# Patient Record
Sex: Female | Born: 1945 | Race: White | Hispanic: No | Marital: Married | Smoking: Never smoker
Health system: Southern US, Community
[De-identification: ages and names within clinical notes are randomized; demographics above are authoritative.]

## PROBLEM LIST (undated history)

## (undated) DIAGNOSIS — R3915 Urgency of urination: Secondary | ICD-10-CM

## (undated) DIAGNOSIS — G20 Parkinson's disease: Secondary | ICD-10-CM

## (undated) DIAGNOSIS — C50912 Malignant neoplasm of unspecified site of left female breast: Secondary | ICD-10-CM

## (undated) DIAGNOSIS — G20A1 Parkinson's disease without dyskinesia, without mention of fluctuations: Secondary | ICD-10-CM

## (undated) DIAGNOSIS — R350 Frequency of micturition: Secondary | ICD-10-CM

## (undated) DIAGNOSIS — N8110 Cystocele, unspecified: Secondary | ICD-10-CM

## (undated) DIAGNOSIS — F411 Generalized anxiety disorder: Secondary | ICD-10-CM

## (undated) DIAGNOSIS — M8580 Other specified disorders of bone density and structure, unspecified site: Secondary | ICD-10-CM

## (undated) DIAGNOSIS — Z973 Presence of spectacles and contact lenses: Secondary | ICD-10-CM

## (undated) HISTORY — PX: EXTERNAL EAR SURGERY: SHX627

## (undated) HISTORY — DX: Parkinson's disease without dyskinesia, without mention of fluctuations: G20.A1

## (undated) HISTORY — DX: Malignant neoplasm of unspecified site of left female breast: C50.912

## (undated) HISTORY — DX: Parkinson's disease: G20

## (undated) HISTORY — PX: ABDOMINAL HYSTERECTOMY: SHX81

## (undated) HISTORY — PX: COLONOSCOPY: SHX174

## (undated) HISTORY — DX: Other specified disorders of bone density and structure, unspecified site: M85.80

---

## 2000-10-18 ENCOUNTER — Other Ambulatory Visit: Admission: RE | Admit: 2000-10-18 | Discharge: 2000-10-18 | Payer: Self-pay | Admitting: Obstetrics and Gynecology

## 2003-04-17 ENCOUNTER — Other Ambulatory Visit: Admission: RE | Admit: 2003-04-17 | Discharge: 2003-04-17 | Payer: Self-pay | Admitting: Obstetrics and Gynecology

## 2004-05-26 ENCOUNTER — Other Ambulatory Visit: Admission: RE | Admit: 2004-05-26 | Discharge: 2004-05-26 | Payer: Self-pay | Admitting: Obstetrics and Gynecology

## 2004-08-09 ENCOUNTER — Ambulatory Visit: Payer: Self-pay | Admitting: Family Medicine

## 2005-04-07 ENCOUNTER — Ambulatory Visit: Payer: Self-pay | Admitting: Family Medicine

## 2005-07-12 ENCOUNTER — Other Ambulatory Visit: Admission: RE | Admit: 2005-07-12 | Discharge: 2005-07-12 | Payer: Self-pay | Admitting: Obstetrics and Gynecology

## 2005-09-19 HISTORY — PX: ESOPHAGOGASTRODUODENOSCOPY: SHX1529

## 2005-11-04 ENCOUNTER — Ambulatory Visit: Payer: Self-pay | Admitting: Internal Medicine

## 2006-03-27 ENCOUNTER — Ambulatory Visit: Payer: Self-pay | Admitting: Internal Medicine

## 2006-06-08 ENCOUNTER — Ambulatory Visit: Payer: Self-pay | Admitting: Family Medicine

## 2006-11-01 ENCOUNTER — Emergency Department (HOSPITAL_COMMUNITY): Admission: EM | Admit: 2006-11-01 | Discharge: 2006-11-01 | Payer: Self-pay | Admitting: Emergency Medicine

## 2006-11-18 HISTORY — PX: LAPAROSCOPIC OVARIAN CYSTECTOMY: SUR786

## 2006-11-29 ENCOUNTER — Other Ambulatory Visit: Admission: RE | Admit: 2006-11-29 | Discharge: 2006-11-29 | Payer: Self-pay | Admitting: Obstetrics and Gynecology

## 2007-12-11 ENCOUNTER — Encounter: Payer: Self-pay | Admitting: Family Medicine

## 2007-12-20 ENCOUNTER — Encounter: Admission: RE | Admit: 2007-12-20 | Discharge: 2007-12-20 | Payer: Self-pay | Admitting: Obstetrics and Gynecology

## 2008-06-02 ENCOUNTER — Ambulatory Visit: Payer: Self-pay | Admitting: Family Medicine

## 2008-06-02 DIAGNOSIS — M899 Disorder of bone, unspecified: Secondary | ICD-10-CM

## 2008-06-02 DIAGNOSIS — R5383 Other fatigue: Secondary | ICD-10-CM

## 2008-06-02 DIAGNOSIS — N83209 Unspecified ovarian cyst, unspecified side: Secondary | ICD-10-CM

## 2008-06-02 DIAGNOSIS — R5381 Other malaise: Secondary | ICD-10-CM

## 2008-06-02 DIAGNOSIS — M949 Disorder of cartilage, unspecified: Secondary | ICD-10-CM

## 2008-06-02 DIAGNOSIS — M8580 Other specified disorders of bone density and structure, unspecified site: Secondary | ICD-10-CM | POA: Insufficient documentation

## 2008-06-05 ENCOUNTER — Encounter (INDEPENDENT_AMBULATORY_CARE_PROVIDER_SITE_OTHER): Payer: Self-pay | Admitting: *Deleted

## 2008-06-11 ENCOUNTER — Telehealth (INDEPENDENT_AMBULATORY_CARE_PROVIDER_SITE_OTHER): Payer: Self-pay | Admitting: *Deleted

## 2008-06-16 ENCOUNTER — Encounter (INDEPENDENT_AMBULATORY_CARE_PROVIDER_SITE_OTHER): Payer: Self-pay | Admitting: *Deleted

## 2008-06-24 ENCOUNTER — Encounter: Admission: RE | Admit: 2008-06-24 | Discharge: 2008-06-24 | Payer: Self-pay | Admitting: Obstetrics and Gynecology

## 2008-06-26 ENCOUNTER — Ambulatory Visit: Payer: Self-pay | Admitting: Family Medicine

## 2009-04-01 ENCOUNTER — Encounter: Admission: RE | Admit: 2009-04-01 | Discharge: 2009-04-01 | Payer: Self-pay | Admitting: Obstetrics and Gynecology

## 2009-06-02 ENCOUNTER — Encounter: Payer: Self-pay | Admitting: Family Medicine

## 2009-06-03 ENCOUNTER — Ambulatory Visit: Payer: Self-pay | Admitting: Family Medicine

## 2009-06-03 DIAGNOSIS — F411 Generalized anxiety disorder: Secondary | ICD-10-CM

## 2009-06-03 DIAGNOSIS — F419 Anxiety disorder, unspecified: Secondary | ICD-10-CM | POA: Insufficient documentation

## 2009-06-05 ENCOUNTER — Encounter: Payer: Self-pay | Admitting: Family Medicine

## 2009-06-05 ENCOUNTER — Encounter (INDEPENDENT_AMBULATORY_CARE_PROVIDER_SITE_OTHER): Payer: Self-pay | Admitting: *Deleted

## 2009-06-05 LAB — CONVERTED CEMR LAB
Albumin: 4.1 g/dL (ref 3.5–5.2)
Alkaline Phosphatase: 51 units/L (ref 39–117)
Basophils Absolute: 0 10*3/uL (ref 0.0–0.1)
Basophils Relative: 0.7 % (ref 0.0–3.0)
CO2: 29 meq/L (ref 19–32)
Calcium: 9 mg/dL (ref 8.4–10.5)
Eosinophils Absolute: 0.1 10*3/uL (ref 0.0–0.7)
Eosinophils Relative: 2.1 % (ref 0.0–5.0)
Glucose, Bld: 85 mg/dL (ref 70–99)
HDL: 51.4 mg/dL (ref >39.00)
Hemoglobin: 13.7 g/dL (ref 12.0–15.0)
Lymphocytes Relative: 27.6 % (ref 12.0–46.0)
Lymphs Abs: 1.2 10*3/uL (ref 0.7–4.0)
MCHC: 33.8 g/dL (ref 30.0–36.0)
MCV: 94.9 fL (ref 78.0–100.0)
Potassium: 3.9 meq/L (ref 3.5–5.1)
RBC: 4.27 M/uL (ref 3.87–5.11)
TSH: 2.17 microintl units/mL (ref 0.35–5.50)
Total Bilirubin: 1 mg/dL (ref 0.3–1.2)
Total CHOL/HDL Ratio: 4
WBC: 4.5 10*3/uL (ref 4.5–10.5)

## 2009-07-02 ENCOUNTER — Encounter: Admission: RE | Admit: 2009-07-02 | Discharge: 2009-07-02 | Payer: Self-pay | Admitting: Neurology

## 2009-07-29 ENCOUNTER — Encounter: Payer: Self-pay | Admitting: Family Medicine

## 2009-10-20 ENCOUNTER — Telehealth (INDEPENDENT_AMBULATORY_CARE_PROVIDER_SITE_OTHER): Payer: Self-pay | Admitting: *Deleted

## 2009-10-21 ENCOUNTER — Ambulatory Visit: Payer: Self-pay | Admitting: Family Medicine

## 2010-03-10 ENCOUNTER — Encounter: Admission: RE | Admit: 2010-03-10 | Discharge: 2010-03-10 | Payer: Self-pay | Admitting: Obstetrics and Gynecology

## 2010-06-14 ENCOUNTER — Encounter: Payer: Self-pay | Admitting: Family Medicine

## 2010-10-17 LAB — CONVERTED CEMR LAB
ALT: 26 units/L (ref 0–35)
Albumin: 4.3 g/dL (ref 3.5–5.2)
Alkaline Phosphatase: 47 units/L (ref 39–117)
Basophils Absolute: 0 10*3/uL (ref 0.0–0.1)
CA 125: 4.3 units/mL (ref 0.0–30.2)
CO2: 31 meq/L (ref 19–32)
Chloride: 112 meq/L (ref 96–112)
Cholesterol: 141 mg/dL (ref 0–200)
Eosinophils Absolute: 0.1 10*3/uL (ref 0.0–0.7)
Folate: 14.9 ng/mL
Free T4: 0.8 ng/dL (ref 0.6–1.6)
GFR calc non Af Amer: 68 mL/min
HCT: 39.3 % (ref 36.0–46.0)
Hemoglobin: 13.6 g/dL (ref 12.0–15.0)
Monocytes Relative: 8.7 % (ref 3.0–12.0)
Neutro Abs: 2.2 10*3/uL (ref 1.4–7.7)
Neutrophils Relative %: 53.9 % (ref 43.0–77.0)
T3, Free: 3.5 pg/mL (ref 2.3–4.2)
TSH: 1.85 microintl units/mL (ref 0.35–5.50)
Total Bilirubin: 0.9 mg/dL (ref 0.3–1.2)
Total CHOL/HDL Ratio: 3.6
Total Protein: 6.8 g/dL (ref 6.0–8.3)
VLDL: 13 mg/dL (ref 0–40)
Vitamin B-12: 603 pg/mL (ref 211–911)
WBC: 3.9 10*3/uL — ABNORMAL LOW (ref 4.5–10.5)

## 2010-10-19 NOTE — Assessment & Plan Note (Signed)
Summary: boosterix, flu shot//fd  Nurse Visit   Allergies: 1)  ! Penicillin  Immunizations Administered:  Tetanus Vaccine:    Vaccine Type: Tdap    Site: left deltoid    Mfr: GlaxoSmithKline    Dose: 0.5 ml    Route: IM    Given by: Floydene Flock    Exp. Date: 11/13/2011    Lot #: [id number]  Orders Added: 1)  Admin 1st Vaccine [90471] 2)  Flu Vaccine 61yrs + [09811] 3)  Tdap => 97yrs IM [90715] 4)  Admin of Any Addtl Vaccine [90472] Flu Vaccine Consent Questions     Do you have a history of severe allergic reactions to this vaccine? no    Any prior history of allergic reactions to egg and/or gelatin? no    Do you have a sensitivity to the preservative Thimersol? no    Do you have a past history of Guillan-Barre Syndrome? no    Do you currently have an acute febrile illness? no    Have you ever had a severe reaction to latex? no    Vaccine information given and explained to patient? yes    Are you currently pregnant? no    Lot Number:[id number]   Exp Date:03/18/2010   Site Given  right Deltoid IM .lbflu

## 2010-10-19 NOTE — Consult Note (Signed)
Summary: [facility]  [facility]   Imported By: Lanelle Bal 06/22/2010 14:17:34  _____________________________________________________________________  External Attachment:    Type:   Image     Comment:   External Document

## 2010-10-19 NOTE — Progress Notes (Signed)
Summary: ?vaccine  Phone Note Call from Patient   Summary of Call: pt states that she is about to have her 1st grand child and wonder if it is too late to get flu shot? and patient also wonder if it would be a good idea to get the pertussis vaccine since there has been a increase in new born getting this. dr Laury Axon pls advise................Marland KitchenFelecia Deloach CMA  October 20, 2009 3:17 PM   Follow-up for Phone Call        flu shot will take 2 weeks to get in system but she should get one--- she had tetanus in 2005 but I don't know which one---please check paper chart--- if she only had Td then we can give her a boosterix Follow-up by: Loreen Freud DO,  October 20, 2009 3:59 PM  Additional Follow-up for Phone Call Additional follow up Details #1::        pt aware appt schedule...................Marland KitchenFelecia Deloach CMA  October 20, 2009 5:23 PM

## 2011-08-10 ENCOUNTER — Encounter: Payer: Self-pay | Admitting: Family Medicine

## 2011-08-17 ENCOUNTER — Encounter: Payer: Self-pay | Admitting: Family Medicine

## 2011-08-17 ENCOUNTER — Ambulatory Visit (INDEPENDENT_AMBULATORY_CARE_PROVIDER_SITE_OTHER): Payer: BC Managed Care – PPO | Admitting: Family Medicine

## 2011-08-17 VITALS — BP 108/66 | HR 61 | Temp 97.9°F | Ht 65.0 in | Wt 122.2 lb

## 2011-08-17 DIAGNOSIS — Z0000 Encounter for general adult medical examination without abnormal findings: Secondary | ICD-10-CM

## 2011-08-17 DIAGNOSIS — Z23 Encounter for immunization: Secondary | ICD-10-CM

## 2011-08-17 LAB — LIPID PANEL
HDL: 70.1 mg/dL (ref >39.00)
LDL Cholesterol: 101 mg/dL — ABNORMAL HIGH (ref 0–99)
Total CHOL/HDL Ratio: 3
Triglycerides: 52 mg/dL (ref 0.0–149.0)
VLDL: 10.4 mg/dL (ref 0.0–40.0)

## 2011-08-17 LAB — HEPATIC FUNCTION PANEL
AST: 22 U/L (ref 0–37)
Albumin: 4.1 g/dL (ref 3.5–5.2)
Alkaline Phosphatase: 49 U/L (ref 39–117)
Bilirubin, Direct: 0.1 mg/dL (ref 0.0–0.3)

## 2011-08-17 LAB — BASIC METABOLIC PANEL
Calcium: 9.2 mg/dL (ref 8.4–10.5)
Chloride: 103 mEq/L (ref 96–112)
Potassium: 4.3 mEq/L (ref 3.5–5.1)

## 2011-08-17 LAB — TSH: TSH: 1 u[IU]/mL (ref 0.35–5.50)

## 2011-08-17 LAB — CBC WITH DIFFERENTIAL/PLATELET
Basophils Relative: 0.5 % (ref 0.0–3.0)
HCT: 39 % (ref 36.0–46.0)
Hemoglobin: 13.3 g/dL (ref 12.0–15.0)
MCV: 92.3 fl (ref 78.0–100.0)
Platelets: 189 10*3/uL (ref 150.0–400.0)

## 2011-08-17 NOTE — Progress Notes (Signed)
  Subjective:     Amanda [name] is a 65 y.o. female and is here for a comprehensive physical exam. The patient reports no problems.  History   Social History  . Marital Status: Married    Spouse Name: N/A    Number of Children: 3  . Years of Education: N/A   Occupational History  . retired    Social History Main Topics  . Smoking status: Never Smoker   . Smokeless tobacco: Not on file  . Alcohol Use: Yes  . Drug Use: No  . Sexually Active:    Other Topics Concern  . Not on file   Social History Narrative  . No narrative on file   Health Maintenance  Topic Date Due  . Mammogram  03/11/2011  . Influenza Vaccine  06/19/2012  . Pap Smear  03/16/2014  . Colonoscopy  06/23/2014  . Tetanus/tdap  10/22/2019  . Zostavax  Completed    The following portions of the patient's history were reviewed and updated as appropriate: allergies, current medications, past family history, past medical history, past social history, past surgical history and problem list.  Review of Systems Review of Systems  Constitutional: Negative for activity change, appetite change and fatigue.  HENT: Negative for hearing loss, congestion, tinnitus and ear discharge.  dentist q25m Eyes: Negative for visual disturbance (see optho q1y -- vision corrected to 20/20 with glasses).  Respiratory: Negative for cough, chest tightness and shortness of breath.   Cardiovascular: Negative for chest pain, palpitations and leg swelling.  Gastrointestinal: Negative for abdominal pain, diarrhea, constipation and abdominal distention.  Genitourinary: Negative for urgency, frequency, decreased urine volume and difficulty urinating.  Musculoskeletal: Negative for back pain, arthralgias and gait problem.  Skin: Negative for color change, pallor and rash.  Neurological: Negative for dizziness, light-headedness, numbness and headaches.  Hematological: Negative for adenopathy. Does not bruise/bleed easily.  Psychiatric/Behavioral:  Negative for suicidal ideas, confusion, sleep disturbance, self-injury, dysphoric mood, decreased concentration and agitation.       Objective:    BP 108/66  Pulse 61  Temp(Src) 97.9 F (36.6 C) (Oral)  Ht 5\' 5"  (1.651 m)  Wt 122 lb 3.2 oz (55.43 kg)  BMI 20.34 kg/m2  SpO2 99% General appearance: alert, cooperative, appears stated age and no distress Head: Normocephalic, without obvious abnormality, atraumatic Eyes: conjunctivae/corneas clear. PERRL, EOM's intact. Fundi benign. Ears: normal TM's and external ear canals both ears Nose: Nares normal. Septum midline. Mucosa normal. No drainage or sinus tenderness. Throat: lips, mucosa, and tongue normal; teeth and gums normal Neck: no adenopathy, no carotid bruit, no JVD, supple, symmetrical, trachea midline and thyroid not enlarged, symmetric, no tenderness/mass/nodules Back: symmetric, no curvature. ROM normal. No CVA tenderness. Lungs: clear to auscultation bilaterally Breasts: gyn Heart: regular rate and rhythm, S1, S2 normal, no murmur, click, rub or gallop Abdomen: soft, non-tender; bowel sounds normal; no masses,  no organomegaly Pelvic: gyn Extremities: extremities normal, atraumatic, no cyanosis or edema Pulses: 2+ and symmetric Skin: Skin color, texture, turgor normal. No rashes or lesions Lymph nodes: Cervical, supraclavicular, and axillary nodes normal. Neurologic: Alert and oriented X 3, normal strength and tone. Normal symmetric reflexes. Normal coordination and gait psych--no depression/ anxiety    Assessment:    Healthy female exam.   osteopenia--- vita d and calcium   Plan:  Check labs Pap/ mamm per gyn ghm utd   See After Visit Summary for Counseling Recommendations

## 2011-08-17 NOTE — Patient Instructions (Signed)

## 2011-08-18 LAB — CA 125: CA 125: 5.1 U/mL (ref 0.0–30.2)

## 2011-11-09 ENCOUNTER — Ambulatory Visit (INDEPENDENT_AMBULATORY_CARE_PROVIDER_SITE_OTHER): Payer: BC Managed Care – PPO | Admitting: Family Medicine

## 2011-11-09 ENCOUNTER — Encounter: Payer: Self-pay | Admitting: Family Medicine

## 2011-11-09 VITALS — BP 118/75 | HR 79 | Temp 98.5°F | Ht 65.5 in | Wt 122.4 lb

## 2011-11-09 DIAGNOSIS — J329 Chronic sinusitis, unspecified: Secondary | ICD-10-CM | POA: Insufficient documentation

## 2011-11-09 MED ORDER — AZITHROMYCIN 250 MG PO TABS: ORAL_TABLET | ORAL | Status: AC

## 2011-11-09 NOTE — Patient Instructions (Addendum)
This is a sinus infection Take the Azithromycin as directed Drink plenty of fluids REST! Call with any questions or concerns Hang in there!!!

## 2011-11-09 NOTE — Assessment & Plan Note (Signed)
Pt's sxs consistent w/ infxn.  Husband recently had Zpack.  Pt requesting similar.  Start meds.  Reviewed supportive care and red flags that should prompt return.  Pt expressed understanding and is in agreement w/ plan.

## 2011-11-09 NOTE — Progress Notes (Signed)
  Subjective:    Patient ID: Amanda [name], female    DOB: Feb 03, 1946, 66 y.o.   MRN: [id number]  HPI ? Sinusitis- + sick contacts.  sxs started nearly a month ago- this initially improved but then again worsened in the last week.  Developed HA, pain behind her eyes.  + nasal congestion- 'greenish yellow stuff'.  No fevers.  Minimal cough.  No ear pain.  No tooth pain.   Review of Systems For ROS see HPI     Objective:   Physical Exam  Vitals reviewed. Constitutional: She appears well-developed and well-nourished. No distress.  HENT:  Head: Normocephalic and atraumatic.  Right Ear: Tympanic membrane normal.  Left Ear: Tympanic membrane normal.  Nose: Mucosal edema and rhinorrhea present. Right sinus exhibits maxillary sinus tenderness and frontal sinus tenderness. Left sinus exhibits maxillary sinus tenderness and frontal sinus tenderness.  Mouth/Throat: Uvula is midline and mucous membranes are normal. Posterior oropharyngeal erythema present. No oropharyngeal exudate.  Eyes: Conjunctivae and EOM are normal. Pupils are equal, round, and reactive to light.  Neck: Normal range of motion. Neck supple.  Cardiovascular: Normal rate, regular rhythm and normal heart sounds.   Pulmonary/Chest: Effort normal and breath sounds normal. No respiratory distress. She has no wheezes.  Lymphadenopathy:    She has no cervical adenopathy.          Assessment & Plan:

## 2012-03-07 ENCOUNTER — Other Ambulatory Visit: Payer: Self-pay | Admitting: Obstetrics and Gynecology

## 2012-06-18 DIAGNOSIS — G20 Parkinson's disease: Secondary | ICD-10-CM | POA: Diagnosis not present

## 2012-07-12 ENCOUNTER — Encounter: Payer: Self-pay | Admitting: Family Medicine

## 2012-07-23 ENCOUNTER — Telehealth: Payer: Self-pay | Admitting: Family Medicine

## 2012-07-23 NOTE — Telephone Encounter (Signed)
wants ti know what if any immunizations she may need now. Pt coming in [place] for flu and would like to get anything else she might need at this same time Cb# [phone]

## 2012-07-23 NOTE — Telephone Encounter (Signed)
Please advise      KP 

## 2012-07-23 NOTE — Telephone Encounter (Signed)
Flu and pneumonia.

## 2012-07-24 NOTE — Telephone Encounter (Signed)
Called pt to advise per pt she thinks she  Has already gotten this but can not remember. Pt would like to know if she gets it again will it hurt her in anyway?

## 2012-07-24 NOTE — Telephone Encounter (Signed)
Discussed with patient and she voiced understanding she will get the flu and pneumo vaccine tomorrow with flu vaccine       KP

## 2012-07-24 NOTE — Telephone Encounter (Signed)
This is what the provider recommended because there is no documentation of it. I will call the patient to make her aware   KP

## 2012-07-25 ENCOUNTER — Ambulatory Visit: Payer: BC Managed Care – PPO

## 2012-08-08 ENCOUNTER — Ambulatory Visit: Payer: BC Managed Care – PPO

## 2012-08-23 ENCOUNTER — Encounter: Payer: Self-pay | Admitting: Family Medicine

## 2012-09-05 ENCOUNTER — Encounter: Payer: Self-pay | Admitting: Family Medicine

## 2012-10-03 ENCOUNTER — Ambulatory Visit (INDEPENDENT_AMBULATORY_CARE_PROVIDER_SITE_OTHER): Payer: Medicare Other | Admitting: Family Medicine

## 2012-10-03 ENCOUNTER — Encounter: Payer: Self-pay | Admitting: Family Medicine

## 2012-10-03 ENCOUNTER — Telehealth: Payer: Self-pay | Admitting: Family Medicine

## 2012-10-03 VITALS — BP 120/70 | HR 66 | Temp 97.6°F | Ht 64.0 in | Wt 123.0 lb

## 2012-10-03 DIAGNOSIS — N390 Urinary tract infection, site not specified: Secondary | ICD-10-CM

## 2012-10-03 DIAGNOSIS — Z139 Encounter for screening, unspecified: Secondary | ICD-10-CM

## 2012-10-03 DIAGNOSIS — Z136 Encounter for screening for cardiovascular disorders: Secondary | ICD-10-CM

## 2012-10-03 DIAGNOSIS — F411 Generalized anxiety disorder: Secondary | ICD-10-CM

## 2012-10-03 DIAGNOSIS — G20 Parkinson's disease: Secondary | ICD-10-CM | POA: Insufficient documentation

## 2012-10-03 DIAGNOSIS — Z0000 Encounter for general adult medical examination without abnormal findings: Secondary | ICD-10-CM

## 2012-10-03 DIAGNOSIS — F419 Anxiety disorder, unspecified: Secondary | ICD-10-CM

## 2012-10-03 LAB — BASIC METABOLIC PANEL
BUN: 21 mg/dL (ref 6–23)
CO2: 30 mEq/L (ref 19–32)
Calcium: 9.6 mg/dL (ref 8.4–10.5)
Chloride: 103 mEq/L (ref 96–112)
Creatinine, Ser: 1 mg/dL (ref 0.4–1.2)
GFR: 60.47 mL/min (ref >60.00)
Glucose, Bld: 86 mg/dL (ref 70–99)
Potassium: 3.6 mEq/L (ref 3.5–5.1)
Sodium: 139 mEq/L (ref 135–145)

## 2012-10-03 LAB — LIPID PANEL
Cholesterol: 190 mg/dL (ref 0–200)
HDL: 69.6 mg/dL (ref >39.00)
LDL Cholesterol: 104 mg/dL — ABNORMAL HIGH (ref 0–99)
Total CHOL/HDL Ratio: 3
Triglycerides: 80 mg/dL (ref 0.0–149.0)
VLDL: 16 mg/dL (ref 0.0–40.0)

## 2012-10-03 LAB — POCT URINALYSIS DIPSTICK
Protein, UA: NEGATIVE
Urobilinogen, UA: 0.2

## 2012-10-03 NOTE — Telephone Encounter (Signed)
Patient would like CPE notes and lab results sent to Dr. Marvel Plan (fx [phone]) and Dr. Sandria Manly (fx [phone]).

## 2012-10-03 NOTE — Progress Notes (Signed)
Subjective:    Amanda [name] is a 67 y.o. female who presents for a welcome to Medicare exam.   Cardiac risk factors: advanced age (older than 38 for men, 69 for women).  Activities of Daily Living  In your present state of health, do you have any difficulty performing the following activities?:  Preparing food and eating?: No Bathing yourself: No Getting dressed: No Using the toilet:No Moving around from place to place: No In the past year have you fallen or had a near fall?:No  Current exercise habits: stretches in bed, stationary bike   Dietary issues discussed: na   Depression Screen (Note: if answer to either of the following is "Yes", then a more complete depression screening is indicated)  Q1: Over the past two weeks, have you felt down, depressed or hopeless?no Q2: Over the past two weeks, have you felt little interest or pleasure in doing things? no   The following portions of the patient's history were reviewed and updated as appropriate:  She  has a past medical history of Anxiety; Osteopenia; and Runner's knee. She  does not have any pertinent problems on file. She  has past surgical history that includes Ovarian cyst removal (11-2006) and laparoscopy. Her family history includes COPD in her sister; Cancer in her father; Heart disease in her brother and mother; and Pulmonary embolism in her sister. She  reports that she has never smoked. She does not have any smokeless tobacco history on file. She reports that she drinks alcohol. She reports that she does not use illicit drugs. She has a current medication list which includes the following prescription(s): azilect, b complex vitamins, calcium carbonate-vitamin d, ciclopirox, dhea, dhea, NON FORMULARY, and fish oil. Current Outpatient Prescriptions on File Prior to Visit  Medication Sig Dispense Refill  . AZILECT 1 MG TABS Take 1 tablet by mouth daily.       Marland Kitchen b complex vitamins tablet Take 1 tablet by mouth daily.         . Calcium Carbonate-Vitamin D (CALCIUM + D PO) Take 1 tablet by mouth daily. Grow Bone Calcium Tablet       . ciclopirox (LOPROX) 0.77 % cream       . DHEA 25 MG CAPS Take by mouth daily.        . Emollient (DHEA) 1 % CREA Apply topically 2 (two) times daily.        . NON FORMULARY Progesterone and Bi-est Cream as directed       . Omega-3 Fatty Acids (FISH OIL) 1200 MG CAPS Take by mouth daily.         She is allergic to penicillins.. Review of Systems  Review of Systems  Constitutional: Negative for activity change, appetite change and fatigue.  HENT: Negative for hearing loss, congestion, tinnitus and ear discharge.   Eyes: Negative for visual disturbance (see optho q1y -- vision corrected to 20/20 with glasses).  Respiratory: Negative for cough, chest tightness and shortness of breath.   Cardiovascular: Negative for chest pain, palpitations and leg swelling.  Gastrointestinal: Negative for abdominal pain, diarrhea, constipation and abdominal distention.  Genitourinary: Negative for urgency, frequency, decreased urine volume and difficulty urinating.  Musculoskeletal: Negative for back pain, arthralgias and gait problem.  Skin: Negative for color change, pallor and rash.  Neurological: Negative for dizziness, light-headedness, numbness and headaches.  Hematological: Negative for adenopathy. Does not bruise/bleed easily.  Psychiatric/Behavioral: Negative for suicidal ideas, confusion, sleep disturbance, self-injury, dysphoric mood, decreased concentration and agitation.  Pt is able to read and write and can do all ADLs No risk for falling No abuse/ violence in home      Objective:     Vision by Snellen chart: opth Blood pressure 120/70, pulse 66, temperature 97.6 F (36.4 C), temperature source Oral, height 5\' 4"  (1.626 m), weight 123 lb (55.792 kg), SpO2 98.00%. Body mass index is 21.11 kg/(m^2). BP 120/70  Pulse 66  Temp 97.6 F (36.4 C) (Oral)  Ht 5\' 4"  (1.626 m)  Wt  123 lb (55.792 kg)  BMI 21.11 kg/m2  SpO2 98% General appearance: alert, cooperative, appears stated age and no distress Head: Normocephalic, without obvious abnormality, atraumatic Eyes: conjunctivae/corneas clear. PERRL, EOM's intact. Fundi benign. Ears: normal TM's and external ear canals both ears Nose: Nares normal. Septum midline. Mucosa normal. No drainage or sinus tenderness. Throat: lips, mucosa, and tongue normal; teeth and gums normal Neck: no adenopathy, no carotid bruit, no JVD, supple, symmetrical, trachea midline and thyroid not enlarged, symmetric, no tenderness/mass/nodules Back: symmetric, no curvature. ROM normal. No CVA tenderness. Lungs: clear to auscultation bilaterally Breasts: gyn Heart: regular rate and rhythm, S1, S2 normal, no murmur, click, rub or gallop Abdomen: soft, non-tender; bowel sounds normal; no masses,  no organomegaly Pelvic: deferred---gyn Extremities: extremities normal, atraumatic, no cyanosis or edema Pulses: 2+ and symmetric Skin: Skin color, texture, turgor normal. No rashes or lesions Lymph nodes: Cervical, supraclavicular, and axillary nodes normal. Neurologic: Alert and oriented X 3, normal strength and tone. Normal symmetric reflexes. Normal coordination and gait psych-- no depression, no anxiety    Assessment:   cpe     Plan:     During the course of the visit the patient was educated and counseled about appropriate screening and preventive services including:   Pneumococcal vaccine   Influenza vaccine  Td vaccine  Screening electrocardiogram  Screening mammography  Screening Pap smear and pelvic exam   Bone densitometry screening  Colorectal cancer screening  Advanced directives: has an advanced directive - a copy HAS NOT been provided.  Patient Instructions (the written plan) was given to the patient.

## 2012-10-03 NOTE — Assessment & Plan Note (Signed)
Per neuro 

## 2012-10-03 NOTE — Patient Instructions (Signed)
Preventive Care for Adults, Female A healthy lifestyle and preventive care can promote health and wellness. Preventive health guidelines for women include the following key practices.  A routine yearly physical is a good way to check with your caregiver about your health and preventive screening. It is a chance to share any concerns and updates on your health, and to receive a thorough exam.  Visit your dentist for a routine exam and preventive care every 6 months. Brush your teeth twice a day and floss once a day. Good oral hygiene prevents tooth decay and gum disease.  The frequency of eye exams is based on your age, health, family medical history, use of contact lenses, and other factors. Follow your caregiver's recommendations for frequency of eye exams.  Eat a healthy diet. Foods like vegetables, fruits, whole grains, low-fat dairy products, and lean protein foods contain the nutrients you need without too many calories. Decrease your intake of foods high in solid fats, added sugars, and salt. Eat the right amount of calories for you.Get information about a proper diet from your caregiver, if necessary.  Regular physical exercise is one of the most important things you can do for your health. Most adults should get at least 150 minutes of moderate-intensity exercise (any activity that increases your heart rate and causes you to sweat) each week. In addition, most adults need muscle-strengthening exercises on 2 or more days a week.  Maintain a healthy weight. The body mass index (BMI) is a screening tool to identify possible weight problems. It provides an estimate of body fat based on height and weight. Your caregiver can help determine your BMI, and can help you achieve or maintain a healthy weight.For adults 20 years and older:  A BMI below 18.5 is considered underweight.  A BMI of 18.5 to 24.9 is normal.  A BMI of 25 to 29.9 is considered overweight.  A BMI of 30 and above is  considered obese.  Maintain normal blood lipids and cholesterol levels by exercising and minimizing your intake of saturated fat. Eat a balanced diet with plenty of fruit and vegetables. Blood tests for lipids and cholesterol should begin at age 20 and be repeated every 5 years. If your lipid or cholesterol levels are high, you are over 50, or you are at high risk for heart disease, you may need your cholesterol levels checked more frequently.Ongoing high lipid and cholesterol levels should be treated with medicines if diet and exercise are not effective.  If you smoke, find out from your caregiver how to quit. If you do not use tobacco, do not start.  If you are pregnant, do not drink alcohol. If you are breastfeeding, be very cautious about drinking alcohol. If you are not pregnant and choose to drink alcohol, do not exceed 1 drink per day. One drink is considered to be 12 ounces (355 mL) of beer, 5 ounces (148 mL) of wine, or 1.5 ounces (44 mL) of liquor.  Avoid use of street drugs. Do not share needles with anyone. Ask for help if you need support or instructions about stopping the use of drugs.  High blood pressure causes heart disease and increases the risk of stroke. Your blood pressure should be checked at least every 1 to 2 years. Ongoing high blood pressure should be treated with medicines if weight loss and exercise are not effective.  If you are 55 to 66 years old, ask your caregiver if you should take aspirin to prevent strokes.  Diabetes   screening involves taking a blood sample to check your fasting blood sugar level. This should be done once every 3 years, after age 45, if you are within normal weight and without risk factors for diabetes. Testing should be considered at a younger age or be carried out more frequently if you are overweight and have at least 1 risk factor for diabetes.  Breast cancer screening is essential preventive care for women. You should practice "breast  self-awareness." This means understanding the normal appearance and feel of your breasts and may include breast self-examination. Any changes detected, no matter how small, should be reported to a caregiver. Women in their 20s and 30s should have a clinical breast exam (CBE) by a caregiver as part of a regular health exam every 1 to 3 years. After age 40, women should have a CBE every year. Starting at age 40, women should consider having a mammography (breast X-ray test) every year. Women who have a family history of breast cancer should talk to their caregiver about genetic screening. Women at a high risk of breast cancer should talk to their caregivers about having magnetic resonance imaging (MRI) and a mammography every year.  The Pap test is a screening test for cervical cancer. A Pap test can show cell changes on the cervix that might become cervical cancer if left untreated. A Pap test is a procedure in which cells are obtained and examined from the lower end of the uterus (cervix).  Women should have a Pap test starting at age 21.  Between ages 21 and 29, Pap tests should be repeated every 2 years.  Beginning at age 30, you should have a Pap test every 3 years as long as the past 3 Pap tests have been normal.  Some women have medical problems that increase the chance of getting cervical cancer. Talk to your caregiver about these problems. It is especially important to talk to your caregiver if a new problem develops soon after your last Pap test. In these cases, your caregiver may recommend more frequent screening and Pap tests.  The above recommendations are the same for women who have or have not gotten the vaccine for human papillomavirus (HPV).  If you had a hysterectomy for a problem that was not cancer or a condition that could lead to cancer, then you no longer need Pap tests. Even if you no longer need a Pap test, a regular exam is a good idea to make sure no other problems are  starting.  If you are between ages 65 and 70, and you have had normal Pap tests going back 10 years, you no longer need Pap tests. Even if you no longer need a Pap test, a regular exam is a good idea to make sure no other problems are starting.  If you have had past treatment for cervical cancer or a condition that could lead to cancer, you need Pap tests and screening for cancer for at least 20 years after your treatment.  If Pap tests have been discontinued, risk factors (such as a new sexual partner) need to be reassessed to determine if screening should be resumed.  The HPV test is an additional test that may be used for cervical cancer screening. The HPV test looks for the virus that can cause the cell changes on the cervix. The cells collected during the Pap test can be tested for HPV. The HPV test could be used to screen women aged 30 years and older, and should   be used in women of any age who have unclear Pap test results. After the age of 30, women should have HPV testing at the same frequency as a Pap test.  Colorectal cancer can be detected and often prevented. Most routine colorectal cancer screening begins at the age of 50 and continues through age 75. However, your caregiver may recommend screening at an earlier age if you have risk factors for colon cancer. On a yearly basis, your caregiver may provide home test kits to check for hidden blood in the stool. Use of a small camera at the end of a tube, to directly examine the colon (sigmoidoscopy or colonoscopy), can detect the earliest forms of colorectal cancer. Talk to your caregiver about this at age 50, when routine screening begins. Direct examination of the colon should be repeated every 5 to 10 years through age 75, unless early forms of pre-cancerous polyps or small growths are found.  Hepatitis C blood testing is recommended for all people born from 1945 through 1965 and any individual with known risks for hepatitis C.  Practice  safe sex. Use condoms and avoid high-risk sexual practices to reduce the spread of sexually transmitted infections (STIs). STIs include gonorrhea, chlamydia, syphilis, trichomonas, herpes, HPV, and human immunodeficiency virus (HIV). Herpes, HIV, and HPV are viral illnesses that have no cure. They can result in disability, cancer, and death. Sexually active women aged 25 and younger should be checked for chlamydia. Older women with new or multiple partners should also be tested for chlamydia. Testing for other STIs is recommended if you are sexually active and at increased risk.  Osteoporosis is a disease in which the bones lose minerals and strength with aging. This can result in serious bone fractures. The risk of osteoporosis can be identified using a bone density scan. Women ages 65 and over and women at risk for fractures or osteoporosis should discuss screening with their caregivers. Ask your caregiver whether you should take a calcium supplement or vitamin D to reduce the rate of osteoporosis.  Menopause can be associated with physical symptoms and risks. Hormone replacement therapy is available to decrease symptoms and risks. You should talk to your caregiver about whether hormone replacement therapy is right for you.  Use sunscreen with sun protection factor (SPF) of 30 or more. Apply sunscreen liberally and repeatedly throughout the day. You should seek shade when your shadow is shorter than you. Protect yourself by wearing long sleeves, pants, a wide-brimmed hat, and sunglasses year round, whenever you are outdoors.  Once a month, do a whole body skin exam, using a mirror to look at the skin on your back. Notify your caregiver of new moles, moles that have irregular borders, moles that are larger than a pencil eraser, or moles that have changed in shape or color.  Stay current with required immunizations.  Influenza. You need a dose every fall (or winter). The composition of the flu vaccine  changes each year, so being vaccinated once is not enough.  Pneumococcal polysaccharide. You need 1 to 2 doses if you smoke cigarettes or if you have certain chronic medical conditions. You need 1 dose at age 65 (or older) if you have never been vaccinated.  Tetanus, diphtheria, pertussis (Tdap, Td). Get 1 dose of Tdap vaccine if you are younger than age 65, are over 65 and have contact with an infant, are a healthcare worker, are pregnant, or simply want to be protected from whooping cough. After that, you need a Td   booster dose every 10 years. Consult your caregiver if you have not had at least 3 tetanus and diphtheria-containing shots sometime in your life or have a deep or dirty wound.  HPV. You need this vaccine if you are a woman age 26 or younger. The vaccine is given in 3 doses over 6 months.  Measles, mumps, rubella (MMR). You need at least 1 dose of MMR if you were born in 1957 or later. You may also need a second dose.  Meningococcal. If you are age 19 to 21 and a first-year college student living in a residence hall, or have one of several medical conditions, you need to get vaccinated against meningococcal disease. You may also need additional booster doses.  Zoster (shingles). If you are age 60 or older, you should get this vaccine.  Varicella (chickenpox). If you have never had chickenpox or you were vaccinated but received only 1 dose, talk to your caregiver to find out if you need this vaccine.  Hepatitis A. You need this vaccine if you have a specific risk factor for hepatitis A virus infection or you simply wish to be protected from this disease. The vaccine is usually given as 2 doses, 6 to 18 months apart.  Hepatitis B. You need this vaccine if you have a specific risk factor for hepatitis B virus infection or you simply wish to be protected from this disease. The vaccine is given in 3 doses, usually over 6 months. Preventive Services / Frequency Ages 19 to 39  Blood  pressure check.** / Every 1 to 2 years.  Lipid and cholesterol check.** / Every 5 years beginning at age 20.  Clinical breast exam.** / Every 3 years for women in their 20s and 30s.  Pap test.** / Every 2 years from ages 21 through 29. Every 3 years starting at age 30 through age 65 or 70 with a history of 3 consecutive normal Pap tests.  HPV screening.** / Every 3 years from ages 30 through ages 65 to 70 with a history of 3 consecutive normal Pap tests.  Hepatitis C blood test.** / For any individual with known risks for hepatitis C.  Skin self-exam. / Monthly.  Influenza immunization.** / Every year.  Pneumococcal polysaccharide immunization.** / 1 to 2 doses if you smoke cigarettes or if you have certain chronic medical conditions.  Tetanus, diphtheria, pertussis (Tdap, Td) immunization. / A one-time dose of Tdap vaccine. After that, you need a Td booster dose every 10 years.  HPV immunization. / 3 doses over 6 months, if you are 26 and younger.  Measles, mumps, rubella (MMR) immunization. / You need at least 1 dose of MMR if you were born in 1957 or later. You may also need a second dose.  Meningococcal immunization. / 1 dose if you are age 19 to 21 and a first-year college student living in a residence hall, or have one of several medical conditions, you need to get vaccinated against meningococcal disease. You may also need additional booster doses.  Varicella immunization.** / Consult your caregiver.  Hepatitis A immunization.** / Consult your caregiver. 2 doses, 6 to 18 months apart.  Hepatitis B immunization.** / Consult your caregiver. 3 doses usually over 6 months. Ages 40 to 64  Blood pressure check.** / Every 1 to 2 years.  Lipid and cholesterol check.** / Every 5 years beginning at age 20.  Clinical breast exam.** / Every year after age 40.  Mammogram.** / Every year beginning at age 40   and continuing for as long as you are in good health. Consult with your  caregiver.  Pap test.** / Every 3 years starting at age 30 through age 65 or 70 with a history of 3 consecutive normal Pap tests.  HPV screening.** / Every 3 years from ages 30 through ages 65 to 70 with a history of 3 consecutive normal Pap tests.  Fecal occult blood test (FOBT) of stool. / Every year beginning at age 50 and continuing until age 75. You may not need to do this test if you get a colonoscopy every 10 years.  Flexible sigmoidoscopy or colonoscopy.** / Every 5 years for a flexible sigmoidoscopy or every 10 years for a colonoscopy beginning at age 50 and continuing until age 75.  Hepatitis C blood test.** / For all people born from 1945 through 1965 and any individual with known risks for hepatitis C.  Skin self-exam. / Monthly.  Influenza immunization.** / Every year.  Pneumococcal polysaccharide immunization.** / 1 to 2 doses if you smoke cigarettes or if you have certain chronic medical conditions.  Tetanus, diphtheria, pertussis (Tdap, Td) immunization.** / A one-time dose of Tdap vaccine. After that, you need a Td booster dose every 10 years.  Measles, mumps, rubella (MMR) immunization. / You need at least 1 dose of MMR if you were born in 1957 or later. You may also need a second dose.  Varicella immunization.** / Consult your caregiver.  Meningococcal immunization.** / Consult your caregiver.  Hepatitis A immunization.** / Consult your caregiver. 2 doses, 6 to 18 months apart.  Hepatitis B immunization.** / Consult your caregiver. 3 doses, usually over 6 months. Ages 65 and over  Blood pressure check.** / Every 1 to 2 years.  Lipid and cholesterol check.** / Every 5 years beginning at age 20.  Clinical breast exam.** / Every year after age 40.  Mammogram.** / Every year beginning at age 40 and continuing for as long as you are in good health. Consult with your caregiver.  Pap test.** / Every 3 years starting at age 30 through age 65 or 70 with a 3  consecutive normal Pap tests. Testing can be stopped between 65 and 70 with 3 consecutive normal Pap tests and no abnormal Pap or HPV tests in the past 10 years.  HPV screening.** / Every 3 years from ages 30 through ages 65 or 70 with a history of 3 consecutive normal Pap tests. Testing can be stopped between 65 and 70 with 3 consecutive normal Pap tests and no abnormal Pap or HPV tests in the past 10 years.  Fecal occult blood test (FOBT) of stool. / Every year beginning at age 50 and continuing until age 75. You may not need to do this test if you get a colonoscopy every 10 years.  Flexible sigmoidoscopy or colonoscopy.** / Every 5 years for a flexible sigmoidoscopy or every 10 years for a colonoscopy beginning at age 50 and continuing until age 75.  Hepatitis C blood test.** / For all people born from 1945 through 1965 and any individual with known risks for hepatitis C.  Osteoporosis screening.** / A one-time screening for women ages 65 and over and women at risk for fractures or osteoporosis.  Skin self-exam. / Monthly.  Influenza immunization.** / Every year.  Pneumococcal polysaccharide immunization.** / 1 dose at age 65 (or older) if you have never been vaccinated.  Tetanus, diphtheria, pertussis (Tdap, Td) immunization. / A one-time dose of Tdap vaccine if you are over   65 and have contact with an infant, are a healthcare worker, or simply want to be protected from whooping cough. After that, you need a Td booster dose every 10 years.  Varicella immunization.** / Consult your caregiver.  Meningococcal immunization.** / Consult your caregiver.  Hepatitis A immunization.** / Consult your caregiver. 2 doses, 6 to 18 months apart.  Hepatitis B immunization.** / Check with your caregiver. 3 doses, usually over 6 months. ** Family history and personal history of risk and conditions may change your caregiver's recommendations. Document Released: 11/01/2001 Document Revised: 11/28/2011  Document Reviewed: 01/31/2011 [employer] Patient Information 2013 [employer].  

## 2012-10-03 NOTE — Addendum Note (Signed)
Addended by: Silvio Pate D on: 10/03/2012 04:19 PM   Modules accepted: Orders

## 2012-10-04 LAB — CA 125: CA 125: 5.1 U/mL (ref 0.0–30.2)

## 2012-10-04 NOTE — Telephone Encounter (Signed)
Sent via [employer]----    KP

## 2012-10-12 ENCOUNTER — Telehealth: Payer: Self-pay | Admitting: *Deleted

## 2012-10-12 MED ORDER — NITROFURANTOIN MONOHYD MACRO 100 MG PO CAPS: 100.0000 mg | ORAL_CAPSULE | Freq: Two times a day (BID) | ORAL | Status: DC

## 2012-10-12 NOTE — Telephone Encounter (Signed)
Discuss with patient, Rx sent. 

## 2012-10-12 NOTE — Telephone Encounter (Signed)
Message copied by Verdene Rio on Fri Oct 12, 2012  4:54 PM ------      Message from: Lelon Perla      Created: Mon Oct 08, 2012  6:54 PM       + UTI---macrobid 1 po bid for 7 days

## 2012-10-15 ENCOUNTER — Telehealth: Payer: Self-pay | Admitting: Family Medicine

## 2012-10-15 NOTE — Telephone Encounter (Signed)
Patient Information:  Caller Name: Regine  Phone: [phone]  Patient: Amanda [name], Amanda [name]  Gender: Female  DOB: 1945/10/08  Age: 67 Years  PCP: Lelon Perla.  Office Follow Up:  Does the office need to follow up with this patient?: No  Instructions For The Office: N/A   Symptoms  Reason For Call & Symptoms: Has cold and cough since since 1/18.  Her sputum is now green.  No fever. Has a sore throat also.  Has pressure around her eyes.  Reviewed Health History In EMR: Yes  Reviewed Medications In EMR: Yes  Reviewed Allergies In EMR: Yes  Reviewed Surgeries / Procedures: Yes  Date of Onset of Symptoms: 10/06/2012  Guideline(s) Used:  Cough  Disposition Per Guideline:   See Today or Tomorrow in Office  Reason For Disposition Reached:   Continuous (nonstop) coughing interferes with work or school and no improvement using cough treatment per Care Advice  Advice Given:  N/A  Appointment Scheduled:  10/16/2012 11:30:00 Appointment Scheduled Provider:  Lelon Perla.

## 2012-10-15 NOTE — Telephone Encounter (Signed)
Appointment Scheduled:  10/16/2012 11:30:00  Appointment Scheduled Provider:  Lelon Perla

## 2012-10-16 ENCOUNTER — Ambulatory Visit (INDEPENDENT_AMBULATORY_CARE_PROVIDER_SITE_OTHER): Payer: Medicare Other | Admitting: Family Medicine

## 2012-10-16 ENCOUNTER — Encounter: Payer: Self-pay | Admitting: Family Medicine

## 2012-10-16 VITALS — BP 110/66 | HR 79 | Temp 98.3°F | Wt 123.8 lb

## 2012-10-16 DIAGNOSIS — J329 Chronic sinusitis, unspecified: Secondary | ICD-10-CM

## 2012-10-16 MED ORDER — CLARITHROMYCIN ER 500 MG PO TB24: 1000.0000 mg | ORAL_TABLET | Freq: Every day | ORAL | Status: AC

## 2012-10-16 NOTE — Patient Instructions (Signed)

## 2012-10-16 NOTE — Progress Notes (Signed)
  Subjective:     Amanda [name] is a 67 y.o. female who presents for evaluation of sinus pain. Symptoms include: congestion, cough, facial pain, headaches, nasal congestion and sinus pressure. Onset of symptoms was 2 weeks ago. Symptoms have been gradually worsening since that time. Past history is significant for no history of pneumonia or bronchitis. Patient is a non-smoker.  The following portions of the patient's history were reviewed and updated as appropriate: allergies, current medications, past family history, past medical history, past social history, past surgical history and problem list.  Review of Systems Pertinent items are noted in HPI.   Objective:    BP 110/66  Pulse 79  Temp 98.3 F (36.8 C) (Oral)  Wt 123 lb 12.8 oz (56.155 kg)  SpO2 98% General appearance: alert, cooperative, appears stated age and no distress Ears: normal TM's and external ear canals both ears Nose: green discharge, moderate congestion, turbinates red, swollen, sinus tenderness bilateral Throat: lips, mucosa, and tongue normal; teeth and gums normal Neck: mild anterior cervical adenopathy, supple, symmetrical, trachea midline and thyroid not enlarged, symmetric, no tenderness/mass/nodules Lungs: clear to auscultation bilaterally Heart: S1, S2 normal    Assessment:    Acute bacterial sinusitis.    Plan:    Nasal saline sprays. Nasal steroids per medication orders. Biaxin per medication orders.  rto prn

## 2012-10-17 ENCOUNTER — Ambulatory Visit: Payer: Medicare Other

## 2012-10-17 ENCOUNTER — Telehealth: Payer: Self-pay | Admitting: *Deleted

## 2012-10-17 MED ORDER — AZITHROMYCIN 250 MG PO TABS: ORAL_TABLET | ORAL | Status: DC

## 2012-10-17 NOTE — Telephone Encounter (Signed)
Rx faxed and VM left making the patient aware.       KP 

## 2012-10-17 NOTE — Telephone Encounter (Signed)
Please advise in Dr.Lowne's absence.   KP 

## 2012-10-17 NOTE — Telephone Encounter (Signed)
Patient called office, states that she is not taking the medication (Biaxin) Dr. Laury Axon prescribed for her yesterday due to the side effects she read about on the package. Patient states that she has taken Z-Pak in the past and it worked for her, wants to know if she can have that again. I advised that I would send message to MD and we would call her back as soon as MD responds.

## 2012-10-17 NOTE — Telephone Encounter (Signed)
Can have Zpack rather than Biaxin

## 2012-11-21 DIAGNOSIS — G20 Parkinson's disease: Secondary | ICD-10-CM | POA: Diagnosis not present

## 2012-12-26 DIAGNOSIS — D2370 Other benign neoplasm of skin of unspecified lower limb, including hip: Secondary | ICD-10-CM | POA: Diagnosis not present

## 2012-12-26 DIAGNOSIS — L538 Other specified erythematous conditions: Secondary | ICD-10-CM | POA: Diagnosis not present

## 2012-12-26 DIAGNOSIS — L84 Corns and callosities: Secondary | ICD-10-CM | POA: Diagnosis not present

## 2012-12-26 DIAGNOSIS — L821 Other seborrheic keratosis: Secondary | ICD-10-CM | POA: Diagnosis not present

## 2012-12-26 DIAGNOSIS — L819 Disorder of pigmentation, unspecified: Secondary | ICD-10-CM | POA: Diagnosis not present

## 2012-12-26 DIAGNOSIS — B351 Tinea unguium: Secondary | ICD-10-CM | POA: Diagnosis not present

## 2012-12-26 DIAGNOSIS — D1801 Hemangioma of skin and subcutaneous tissue: Secondary | ICD-10-CM | POA: Diagnosis not present

## 2012-12-26 DIAGNOSIS — Z419 Encounter for procedure for purposes other than remedying health state, unspecified: Secondary | ICD-10-CM | POA: Diagnosis not present

## 2013-02-01 ENCOUNTER — Ambulatory Visit (INDEPENDENT_AMBULATORY_CARE_PROVIDER_SITE_OTHER): Payer: Medicare Other | Admitting: Internal Medicine

## 2013-02-01 ENCOUNTER — Telehealth: Payer: Self-pay | Admitting: Family Medicine

## 2013-02-01 ENCOUNTER — Encounter: Payer: Self-pay | Admitting: Internal Medicine

## 2013-02-01 VITALS — BP 112/66 | HR 63 | Temp 98.0°F | Wt 117.0 lb

## 2013-02-01 DIAGNOSIS — N390 Urinary tract infection, site not specified: Secondary | ICD-10-CM | POA: Diagnosis not present

## 2013-02-01 DIAGNOSIS — J019 Acute sinusitis, unspecified: Secondary | ICD-10-CM | POA: Diagnosis not present

## 2013-02-01 DIAGNOSIS — R300 Dysuria: Secondary | ICD-10-CM

## 2013-02-01 LAB — POCT URINALYSIS DIPSTICK
Bilirubin, UA: NEGATIVE
Blood, UA: NEGATIVE
Glucose, UA: NEGATIVE
Ketones, UA: NEGATIVE
Leukocytes, UA: NEGATIVE
pH, UA: 6

## 2013-02-01 MED ORDER — AZELASTINE HCL 0.1 % NA SOLN: 2.0000 | Freq: Two times a day (BID) | NASAL | Status: DC

## 2013-02-01 MED ORDER — AZITHROMYCIN 250 MG PO TABS: ORAL_TABLET | ORAL | Status: DC

## 2013-02-01 NOTE — Telephone Encounter (Signed)
Appointment Scheduled:  02/01/2013 15:45:00  Appointment Scheduled Provider:  Willow Ora

## 2013-02-01 NOTE — Telephone Encounter (Signed)
Patient Information:  Caller Name: Amanda [name]  Phone: [phone]  Patient: Amanda, [name]  Gender: Female  DOB: [birth year]  Age: 67 Years  PCP: Lelon Perla.  Office Follow Up:  Does the office need to follow up with this patient?: No  Instructions For The Office: N/A  RN Note:  Patient wants to have her urine tested for possible urinary infection. She also wants a copy of the lab results she had done in January. Advised patient to request those records when she is in the office this afternoon for her visit. Cancelled appt that had been scheduled for Monday 02/04/13.  Symptoms  Reason For Call & Symptoms: Mild sore throat, nasal congestion, runny nose, hoarseness to voice, yellow/green drainage down back of throat. Reports pain over cheeks, around teeth and in between her eyebrows.  Reviewed Health History In EMR: Yes  Reviewed Medications In EMR: Yes  Reviewed Allergies In EMR: Yes  Reviewed Surgeries / Procedures: Yes  Date of Onset of Symptoms: 02/01/2013  Treatments Tried: Coldeze, Saline nose drops  Treatments Tried Worked: No  Guideline(s) Used:  Colds  Sinus Pain and Congestion  Disposition Per Guideline:   See Today or Tomorrow in Office  Reason For Disposition Reached:   Using nasal washes and pain medicine > 24 hours and sinus pain (lower forehead, cheekbone, or eye) persists  Advice Given:  N/A  Patient Will Follow Care Advice:  YES  Appointment Scheduled:  02/01/2013 15:45:00 Appointment Scheduled Provider:  Willow Ora

## 2013-02-01 NOTE — Progress Notes (Signed)
  Subjective:    Patient ID: Amanda [name], female    DOB: [birth year], 67 y.o.   MRN: [id number]  HPI Symptoms started approximately 2 weeks ago: Sore throat, nasal congestion,postnasal dripping. Initially had clear white discharge, now yellow discharge. Some pain in her teeth as well.  Past Medical History  Diagnosis Date  . Anxiety   . Osteopenia   . Runner's knee   . Parkinson disease    Past Surgical History  Procedure Laterality Date  . Ovarian cyst removal  11-2006  . Laparoscopy      Review of Systems No fever or chills. Mild cough and  hoarseness. Admits to fatigue and some shortness of breath, she thinks related to not being able to breathe through her nose. Denies any itchy eyes or itchy nose. No chest pain. Also concerned abouta UTI she had a few months ago, requests her urine to be checked. Denies at the present time dysuria or hematuria.      Objective:   Physical Exam BP 112/66  Pulse 63  Temp(Src) 98 F (36.7 C) (Oral)  Wt 117 lb (53.071 kg)  BMI 20.07 kg/m2  SpO2 97%  General -- alert, well-developed, NAD.    HEENT -- TMs normal, throat w/o redness, face symmetric and slt tender to palpation at L max sinus and B  Frontal sinus.  EOMI Lungs -- normal respiratory effort, no intercostal retractions, no accessory muscle use, and normal breath sounds.   Heart-- normal rate, regular rhythm, no murmur, and no gallop.    Neurologic-- alert & oriented X3 and strength normal in all extremities. Psych-- Cognition and judgment appear intact. Alert and cooperative with normal attention span and concentration.  not anxious appearing and not depressed appearing.       Assessment & Plan:  Acute sinusitis Findings consistent with acute sinusitis, she is allergic to penicillin, she is afraid to take any antibiotic other than Zithromax because she takes Azilect. She complains of shortness or breath, she thinks related to nose congestion. Vital signs are stable, no  distress. Plan: Zithromax, Mucinex,Astelin; I checked the 3 meds and they don't interact w/ Azilect. Call if not better, call if she's getting worse. If not improving, may need a different antibiotic. Avelox?  UTI, Now asymptomatic, udip negative.

## 2013-02-01 NOTE — Patient Instructions (Addendum)
Rest, fluids , tylenol For congestion take Mucinex  twice a day as needed  For congestion use astelin nasal spray twice a day until you feel better Neither interact with Azilect Take the antibiotic as prescribed  (zithromax) Call if no better in few days Call anytime if the symptoms are severe

## 2013-02-02 ENCOUNTER — Encounter: Payer: Self-pay | Admitting: Internal Medicine

## 2013-02-04 ENCOUNTER — Ambulatory Visit: Payer: Medicare Other | Admitting: Family Medicine

## 2013-02-06 ENCOUNTER — Telehealth: Payer: Self-pay | Admitting: *Deleted

## 2013-02-06 NOTE — Telephone Encounter (Signed)
Spoke to pt, she states she is feeling much better.  

## 2013-02-06 NOTE — Telephone Encounter (Signed)
Was seen with sinusitis, she is doing better.

## 2013-03-27 ENCOUNTER — Ambulatory Visit (INDEPENDENT_AMBULATORY_CARE_PROVIDER_SITE_OTHER): Payer: Medicare Other | Admitting: Neurology

## 2013-03-27 ENCOUNTER — Encounter: Payer: Self-pay | Admitting: Neurology

## 2013-03-27 VITALS — BP 100/61 | HR 67 | Temp 97.7°F | Ht 64.0 in | Wt 115.5 lb

## 2013-03-27 DIAGNOSIS — G20 Parkinson's disease: Secondary | ICD-10-CM | POA: Diagnosis not present

## 2013-03-27 DIAGNOSIS — G20A1 Parkinson's disease without dyskinesia, without mention of fluctuations: Secondary | ICD-10-CM

## 2013-03-27 NOTE — Patient Instructions (Addendum)
I think overall you are doing fairly well and are stable at this point.   I do have some generic suggestions for you today:  Please make sure that you drink plenty of fluids. I would like for you to exercise daily for example in the form of walking 20-30 minutes every day, if you can. Please keep a regular sleep-wake schedule, keep regular meal times, do not skip any meals, eat  healthy snacks in between meals, such as fruit or nuts. Try to eat protein with every meal. Train your balance regularly.    As far as your medications are concerned, I would like to suggest: no new changes.   As far as diagnostic testing, I recommend: no new test.  Engage in social activities in your community and with your family and try to keep up with current events by reading the newspaper or watching the news.  I do not think we need to make any changes in your medications at this point. I think you're stable enough that I can see you back in 6 months, sooner if we need to. Please call us if you have any interim questions, concerns, or problems or updates to need to discuss.   Brett Canales is my clinical assistant and will answer any of your questions and relay your messages to me and will give you my messages.   Our phone number is [phone]. We also have an after hours call service for urgent matters and there is a physician on-call for urgent questions. For any emergencies you know to call 911 or go to the nearest emergency room.

## 2013-03-27 NOTE — Progress Notes (Signed)
Subjective:    Patient ID: Amanda [name] is a 67 y.o. female.  HPI  Interim history:    Amanda [name] is a very pleasant 67 year old right-handed woman who presents for followup consultation of her Parkinson's disease. She is unaccompanied today. This is her first visit after Dr. Imagene Gurney retirement and she last saw him on 11/21/2012, at which time Dr. love felt that she was doing very well. She has had some increase in tremors. He did not suggest any medication changes at the time. She is currently  on progesterone, visual, rasagiline 1 mg daily, adult size aspirin as needed, vitamin B complex, digestive enzymes. She has an underlying medical history of anxiety and hyperlipidemia.  I reviewed Dr. Imagene Gurney prior notes and the patient's records and below is a summary of that review:  67 year old right-handed woman who noticed a right hand tremor in April 2010 followed by a right leg tremor in June 2010. MRI brain with and without contrast in October 2000 and showed nonspecific white matter hyperintensities, 24-hour urine for heavy metals was negative. CBC, CMP, TSH, RPR, ESR and serum ceruloplasmin levels were normal. She started rasagiline in November 2010 and tolerated it well. There has been some concern about her memory. She denies any hallucinations, depression or orthostatic hypotension. She has not been exercising as regularly. She does have a stationary bike. Her husband had bilateral knee surgeries and has had pain with walking, which is why she is not walking as much.  Her Past Medical History Is Significant For: Past Medical History  Diagnosis Date  . Anxiety   . Osteopenia   . Runner's knee   . Parkinson disease    Her Past Surgical History Is Significant For: Past Surgical History  Procedure Laterality Date  . Ovarian cyst removal  11-2006  . Laparoscopy     Her Family History Is Significant For: Family History  Problem Relation Age of Onset  . Heart disease Mother     enlarged  heart  . Cancer Father     pancreatic  . COPD Sister   . Pulmonary embolism Sister   . Heart disease Brother    Her Social History Is Significant For: History   Social History  . Marital Status: Married    Spouse Name: Iantha Fallen    Number of Children: 3  . Years of Education: College   Occupational History  . retired    Social History Main Topics  . Smoking status: Never Smoker   . Smokeless tobacco: Never Used  . Alcohol Use: Yes     Comment: 1 glass of wine occasionally  . Drug Use: No  . Sexually Active: Yes -- Female partner(s)   Other Topics Concern  . None   Social History Narrative   Pt lives at home with family.   Caffeine Use: some, occasionally    Her Allergies Are:  Allergies  Allergen Reactions  . Penicillins   :   Her Current Medications Are:  Outpatient Encounter Prescriptions as of 03/27/2013  Medication Sig Dispense Refill  . AZILECT 1 MG TABS Take 1 tablet by mouth daily.       Marland Kitchen b complex vitamins tablet Take 1 tablet by mouth daily.        . Calcium Carbonate-Vitamin D (CALCIUM + D PO) Take 1 tablet by mouth daily. Grow Bone Calcium Tablet       . DHEA 25 MG CAPS Take by mouth daily.       Marland Kitchen estradiol (ESTRACE) 0.1  MG/GM vaginal cream Place 1 g vaginally 2 (two) times daily.      . NON FORMULARY Progesterone and Bi-est Cream as directed       . Omega-3 Fatty Acids (FISH OIL) 1200 MG CAPS Take by mouth daily.        . [DISCONTINUED] azelastine (ASTELIN) 137 MCG/SPRAY nasal spray Place 2 sprays into the nose 2 (two) times daily. Use in each nostril as directed  30 mL  1  . [DISCONTINUED] azithromycin (ZITHROMAX) 250 MG tablet Take medication by mouth as directed  6 each  0   No facility-administered encounter medications on file as of 03/27/2013.  : Review of Systems  Constitutional: Positive for fatigue and unexpected weight change (weight loss).  Gastrointestinal: Positive for constipation.  Neurological: Positive for tremors.    Objective:   Neurologic Exam  Physical Exam Physical Examination:   Filed Vitals:   03/27/13 1121  BP: 100/61  Pulse: 67  Temp: 97.7 F (36.5 C)    General Examination: The patient is a very pleasant 67 y.o. female in no acute distress.  HEENT: Normocephalic, atraumatic, pupils are equal, round and reactive to light and accommodation. Funduscopic exam is normal with sharp disc margins noted. Extraocular tracking shows mild saccadic breakdown without nystagmus noted. There is limitation to upper gaze. There is mild decrease in eye blink rate. Hearing is intact. Tympanic membranes are clear bilaterally. Face is symmetric with mild facial masking and normal facial sensation. There is no lip, neck or jaw tremor. Neck is mildly rigid with intact passive ROM. There are no carotid bruits on auscultation. Oropharynx exam reveals mild mouth dryness. No significant airway crowding is noted. Mallampati is class I. Tongue protrudes centrally and palate elevates symmetrically.   There is no drooling.   Chest: is clear to auscultation without wheezing, rhonchi or crackles noted.  Heart: sounds are regular and normal without murmurs, rubs or gallops noted.   Abdomen: is soft, non-tender and non-distended with normal bowel sounds appreciated on auscultation.  Extremities: There is no pitting edema in the distal lower extremities bilaterally. Pedal pulses are intact. There are no varicose veins.  Skin: is warm and dry with no trophic changes noted.    Musculoskeletal: exam reveals no obvious joint deformities, tenderness, joint swelling or erythema.  Neurologically:  Mental status: The patient is awake and alert, paying good  attention. She is able to completely provide the history. She is oriented to: person, place, time/date, situation, day of week, month of year and year. Her memory, attention, language and knowledge are intact. There is no aphasia, agnosia, apraxia or anomia. There is a no significant degree  of bradyphrenia. Speech is very mildly hypophonic with no dysarthria noted. Mood is congruent and affect is normal.    Cranial nerves are as described above under HEENT exam. In addition, shoulder shrug is normal with equal shoulder height noted.  Motor exam: Normal bulk, and strength for age is noted. There are no dyskinesias noted.  Tone is mildly rigid with presence of cogwheeling in the right upper extremity. There is overall very mild bradykinesia. There is no drift or rebound.  There is an intermittent RLE resting tremor.   Romberg is negative.  Reflexes are 2+ in the upper extremities and 2+ in the lower extremities. Toes are downgoing bilaterally.  Fine motor skills exam: Finger taps are mildly impaired on the right and not impaired on the left. Hand movements are mildly impaired on the right and not impaired on  the left. RAP (rapid alternating patting) is minimally impaired on the right and not impaired on the left. Foot taps are mildly impaired on the right and minimally impaired on the left. Foot agility (in the form of heel stomping) is mildly impaired on the right and minimally impaired on the left.    Cerebellar testing shows no dysmetria or intention tremor on finger to nose testing. Heel to shin is unremarkable bilaterally. There is no truncal or gait ataxia.   Sensory exam is intact to light touch, pinprick, vibration, temperature sense and proprioception in the upper and lower extremities.   Gait, station and balance: She stands up from the seated position with minimal difficulty and does not need to push up with Her hands. She needs no assistance. No veering to one side is noted. She is not noted to lean to the side. Posture is mildly stooped. Stance is narrow-based. She walks with decrease in stride length and pace and decreased arm swing on the right. She turns in 3 steps. Tandem walk is not possible. Balance is preserved. She is able to do a toe or heel stance.    Assessment and  Plan:   In summary, Rolonda Pontarelli is a very pleasant 67 y.o.-year old female with a history of Parkinson's disease, right-sided predominant. Her physical exam is stable and has not progressed in the last 5 months. She is doing fairly well at this time and I reassured the patient in that regard.  I had a long chat with the patient about Her symptoms, my findings and the diagnosis of parkinsonism/Parksinson's disease, its prognosis and treatment options. We talked about medical treatments and non-pharmacological approaches. We talked about maintaining a healthy lifestyle in general. I also talked to her about incorporating coenzyme Q10 into her medication regimen if she desires. I told her that there has been some promising results for this supplement which is not at this point FDA approved however. She understands this. She is not keen on adding any new medication. I was considering a dopamine agonist but she wants to step up her exercise program for now. I encouraged the patient to eat healthy, exercise daily and keep well hydrated, to keep a scheduled bedtime and wake time routine, to not skip any meals and eat healthy snacks in between meals and to have protein with every meal. In particular, I stressed the importance of regular exercise, within of course the patient's own mobility limitations.   As far as further diagnostic testing is concerned, I suggested: no change.  As far as medications are concerned, I recommended the following at this time: no change. She will continue with Azilect 1 mg once daily. I answered all her questions today and the patient was in agreement with the above outlined plan. I would like to see the patient back in 6 months, sooner if the need arises and encouraged her to call with any interim questions, concerns, problems or updates and refill requests

## 2013-04-19 ENCOUNTER — Telehealth: Payer: Self-pay | Admitting: Neurology

## 2013-05-06 ENCOUNTER — Telehealth: Payer: Self-pay | Admitting: Neurology

## 2013-05-09 ENCOUNTER — Other Ambulatory Visit: Payer: Self-pay

## 2013-05-09 MED ORDER — RASAGILINE MESYLATE 1 MG PO TABS: 1.0000 mg | ORAL_TABLET | Freq: Every day | ORAL | Status: DC

## 2013-07-12 ENCOUNTER — Encounter: Payer: Self-pay | Admitting: Family Medicine

## 2013-07-12 ENCOUNTER — Ambulatory Visit (INDEPENDENT_AMBULATORY_CARE_PROVIDER_SITE_OTHER): Payer: Medicare Other | Admitting: Family Medicine

## 2013-07-12 VITALS — BP 116/60 | HR 71 | Temp 98.5°F | Wt 116.0 lb

## 2013-07-12 DIAGNOSIS — Z23 Encounter for immunization: Secondary | ICD-10-CM

## 2013-07-12 DIAGNOSIS — R350 Frequency of micturition: Secondary | ICD-10-CM | POA: Diagnosis not present

## 2013-07-12 LAB — POCT URINALYSIS DIPSTICK
Bilirubin, UA: NEGATIVE
Blood, UA: NEGATIVE
Glucose, UA: NEGATIVE
Ketones, UA: NEGATIVE
Nitrite, UA: NEGATIVE

## 2013-07-12 MED ORDER — NITROFURANTOIN MONOHYD MACRO 100 MG PO CAPS: 100.0000 mg | ORAL_CAPSULE | Freq: Two times a day (BID) | ORAL | Status: DC

## 2013-07-12 NOTE — Progress Notes (Signed)
  Subjective:    Amanda [name] is a 67 y.o. female who complains of frequency and urgency. She has had symptoms for a few days. Patient also complains of no other symptoms. Patient denies back pain, congestion, cough, fever, headache, rhinitis, sorethroat, stomach ache and vaginal discharge. Patient does have a history of recurrent UTI. Patient does not have a history of pyelonephritis.   The following portions of the patient's history were reviewed and updated as appropriate: allergies, current medications, past family history, past medical history, past social history, past surgical history and problem list.  Review of Systems Pertinent items are noted in HPI.    Objective:    BP 116/60  Pulse 71  Temp(Src) 98.5 F (36.9 C) (Oral)  Wt 116 lb (52.617 kg)  BMI 19.9 kg/m2  SpO2 98% General appearance: alert, cooperative, appears stated age and no distress  Laboratory:  Urine dipstick: trace for leukocyte esterase.   Micro exam: not done.    Assessment:    urinary frequence     Plan:    Medications: ciprofloxacin. Maintain adequate hydration. Follow up if symptoms not improving, and as needed.

## 2013-07-15 LAB — URINE CULTURE: Colony Count: >=100000

## 2013-07-29 ENCOUNTER — Telehealth: Payer: Self-pay | Admitting: *Deleted

## 2013-07-29 NOTE — Telephone Encounter (Signed)
It seems that she was adequately treated.  She does not need to complete the remaining 5 pills.  If she has recurrent sxs, will need appt

## 2013-07-29 NOTE — Telephone Encounter (Signed)
Patient states that she was given Macrobid for an UTI. Patient states that she found an old bottle of the same pills and mistakenly took them instead of of the new prescription. The old prescription had only 1 pill and that's all she took. That was about 2 weeks ago. Patient found the new prescription today and there was 5 pills left to take. Patient would like to know if she should start back taking the medication?  Patient states that the only symptoms that she is having is the urge to go. No other symptoms noted. Please advise. SW

## 2013-07-31 ENCOUNTER — Other Ambulatory Visit: Payer: Self-pay | Admitting: Family Medicine

## 2013-07-31 NOTE — Telephone Encounter (Signed)
Patient stated that she was afraid that she was not properly treated. She has begun taking the 5 pills and is feeling some relief. Made patient aware that if symptoms return than she would need to schedule an office visit.

## 2013-08-05 ENCOUNTER — Telehealth: Payer: Self-pay | Admitting: *Deleted

## 2013-08-05 NOTE — Telephone Encounter (Signed)
Patient called and stated that her symptoms are coming back from the UTI that she had. (see previous phone note). Patient states that sent her husband up her to pick up a sterile cup and was told that she needed to come in for a office visit. Spoke with patient and she stated that she has problem with going. She has the urge of going but not able to go, so its best if she has the sterile cup to have at home with her when she has to go to the bathroom. Patient doesn't feel like she should have to come in when she was sent home with the sterile container last time. Please advise if it is okay for patient to come and get a sterile container and collect the urine at home and bring it back to the office. SW

## 2013-08-05 NOTE — Telephone Encounter (Signed)
She can get cup but would have to wait for culture to come back for treatment---48 hours If seen in office and dip + could tx before culture is  Back.

## 2013-08-06 ENCOUNTER — Other Ambulatory Visit (INDEPENDENT_AMBULATORY_CARE_PROVIDER_SITE_OTHER): Payer: Medicare Other

## 2013-08-06 DIAGNOSIS — R300 Dysuria: Secondary | ICD-10-CM

## 2013-08-06 LAB — POCT URINALYSIS DIPSTICK
Blood, UA: NEGATIVE
Nitrite, UA: NEGATIVE
Urobilinogen, UA: 0.2
pH, UA: 6.5

## 2013-08-06 NOTE — Telephone Encounter (Signed)
Patient states that she would rather come and pick up the cup. Patient states that she will bring it right back as soon as she finishes with it. SW

## 2013-08-14 ENCOUNTER — Telehealth: Payer: Self-pay | Admitting: Family Medicine

## 2013-08-14 NOTE — Telephone Encounter (Signed)
Patient has been made aware and voiced understanding. Patient has a history of prolapse and she will discuss further with her GYN. Patient is not having pain at the site of the injection but on the lower part of the arm, I made her aware she may to come in for an evaluation. She said she will discuss this with er GYN as well    KP

## 2013-08-14 NOTE — Telephone Encounter (Signed)
Patient called and stated she did a urine specimen two weeks ago and have not heard anything back yet. Also she states that she recently got a flu shot a month ago and her arm is still sore.

## 2013-08-21 DIAGNOSIS — N390 Urinary tract infection, site not specified: Secondary | ICD-10-CM | POA: Diagnosis not present

## 2013-08-21 DIAGNOSIS — N8110 Cystocele, unspecified: Secondary | ICD-10-CM | POA: Diagnosis not present

## 2013-09-23 ENCOUNTER — Encounter: Payer: Self-pay | Admitting: Neurology

## 2013-09-23 ENCOUNTER — Ambulatory Visit (INDEPENDENT_AMBULATORY_CARE_PROVIDER_SITE_OTHER): Payer: Medicare Other | Admitting: Neurology

## 2013-09-23 VITALS — BP 102/56 | HR 74 | Temp 98.7°F | Ht 64.0 in | Wt 114.0 lb

## 2013-09-23 DIAGNOSIS — G20 Parkinson's disease: Secondary | ICD-10-CM

## 2013-09-23 MED ORDER — RASAGILINE MESYLATE 1 MG PO TABS: 1.0000 mg | ORAL_TABLET | Freq: Every day | ORAL | Status: DC

## 2013-09-23 NOTE — Progress Notes (Signed)
Subjective:    Patient ID: Amanda [name] is a 68 y.o. female.  HPI  Interim history:   Amanda [name] is a very pleasant 68 year old right-handed woman who presents for followup consultation of her right ear predominant Parkinson's disease. She is unaccompanied today. I first met her on 03/27/2013 at which time I suggested no new medication with the exception of consideration of adding coenzyme Q 10 over-the-counter. She is on Azilect. She previously followed with Dr. Jeneen Rinks love and was last seen by him on 11/21/2012, at which time Dr. Erling Cruz felt that she was doing very well. She has had some increase in tremors. He did not suggest any medication changes at the time. She has an underlying medical history of anxiety and hyperlipidemia.  Today, she reports feeling fairly stable, but has noted an intermittent tremor in her R arm and leg, especially when she is anxious, nervous or concentrating. She helps take care of her 43 yo grand son and her son, who is going through a divorce is living in their basement, which are added stressors at times. She has not been exercising lately.  She first noticed a right hand tremor in April 2010 followed by a right leg tremor in June 2010. MRI brain with and without contrast showed nonspecific white matter hyperintensities, 24-hour urine for heavy metals was negative. CBC, CMP, TSH, RPR, ESR and serum ceruloplasmin levels were normal. She started rasagiline in November 2010 and tolerated it well. There has been some concern about her memory. She denies any hallucinations, depression or orthostatic hypotension. She does not exercise regularly.   Her Past Medical History Is Significant For: Past Medical History  Diagnosis Date  . Anxiety   . Osteopenia   . Runner's knee   . Parkinson disease     Her Past Surgical History Is Significant For: Past Surgical History  Procedure Laterality Date  . Ovarian cyst removal  11-2006  . Laparoscopy      Her Family History Is  Significant For: Family History  Problem Relation Age of Onset  . Heart disease Mother     enlarged heart  . Cancer Father     pancreatic  . COPD Sister   . Pulmonary embolism Sister   . Heart disease Brother     Her Social History Is Significant For: History   Social History  . Marital Status: Married    Spouse Name: Amanda [name]    Number of Children: 3  . Years of Education: College   Occupational History  . retired    Social History Main Topics  . Smoking status: Never Smoker   . Smokeless tobacco: Never Used  . Alcohol Use: Yes     Comment: 1 glass of wine occasionally  . Drug Use: No  . Sexual Activity: Yes    Partners: Male   Other Topics Concern  . None   Social History Narrative   Pt lives at home with family.   Caffeine Use: some, occasionally    Her Allergies Are:  Allergies  Allergen Reactions  . Penicillins   :   Her Current Medications Are:  Outpatient Encounter Prescriptions as of 09/23/2013  Medication Sig  . b complex vitamins tablet Take 1 tablet by mouth daily.    . Calcium Carbonate-Vitamin D (CALCIUM + D PO) Take 1 tablet by mouth daily. Grow Bone Calcium Tablet   . DHEA 25 MG CAPS Take by mouth daily.   Marland Kitchen estradiol (ESTRACE) 0.1 MG/GM vaginal cream Place 1 g vaginally  2 (two) times daily.  . NON FORMULARY Progesterone and Bi-est Cream as directed   . Omega-3 Fatty Acids (FISH OIL) 1200 MG CAPS Take by mouth daily.    . rasagiline (AZILECT) 1 MG TABS tablet Take 1 tablet (1 mg total) by mouth daily.  . [DISCONTINUED] nitrofurantoin, macrocrystal-monohydrate, (MACROBID) 100 MG capsule Take 1 capsule (100 mg total) by mouth 2 (two) times daily.  :  Review of Systems:  Out of a complete 14 point review of systems, all are reviewed and negative with the exception of these symptoms as listed below:   Review of Systems  Constitutional: Negative.   HENT: Negative.   Eyes: Negative.   Respiratory: Negative.   Cardiovascular: Negative.    Gastrointestinal: Positive for constipation.  Endocrine: Negative.   Genitourinary: Negative.   Musculoskeletal: Positive for myalgias.  Skin: Negative.   Allergic/Immunologic: Negative.   Neurological: Positive for tremors.  Hematological: Negative.   Psychiatric/Behavioral: Positive for dysphoric mood. The patient is nervous/anxious.     Objective:  Neurologic Exam  Physical Exam Physical Examination:   Filed Vitals:   09/23/13 1514  BP: 102/56  Pulse: 74  Temp: 98.7 F (37.1 C)    General Examination: The patient is a very pleasant 68 y.o. female in no acute distress.  HEENT: Normocephalic, atraumatic, pupils are equal, round and reactive to light and accommodation. Funduscopic exam is normal with sharp disc margins noted. Extraocular tracking shows mild saccadic breakdown without nystagmus noted. There is limitation to upper gaze. There is mild decrease in eye blink rate. Hearing is intact. Face is symmetric with mild facial masking and normal facial sensation. There is no lip, neck or jaw tremor. Neck is mildly rigid with intact passive ROM. There are no carotid bruits on auscultation. Oropharynx exam reveals mild mouth dryness. No significant airway crowding is noted. Mallampati is class I. Tongue protrudes centrally and palate elevates symmetrically.   There is no drooling.   Chest: is clear to auscultation without wheezing, rhonchi or crackles noted.  Heart: sounds are regular and normal without murmurs, rubs or gallops noted.   Abdomen: is soft, non-tender and non-distended with normal bowel sounds appreciated on auscultation.  Extremities: There is no pitting edema in the distal lower extremities bilaterally. Pedal pulses are intact. There are no varicose veins.  Skin: is warm and dry with no trophic changes noted.    Musculoskeletal: exam reveals no obvious joint deformities, tenderness, joint swelling or erythema.  Neurologically:  Mental status: The patient  is awake and alert, paying good  attention. She is able to completely provide the history. She is oriented to: person, place, time/date, situation, day of week, month of year and year. Her memory, attention, language and knowledge are intact. There is no aphasia, agnosia, apraxia or anomia. There is a no significant degree of bradyphrenia. Speech is very mildly hypophonic with no dysarthria noted. Mood is congruent and affect is normal.    Cranial nerves are as described above under HEENT exam. In addition, shoulder shrug is normal with equal shoulder height noted.  Motor exam: Normal bulk, and strength for age is noted. There are no dyskinesias noted.  Tone is mildly rigid with presence of cogwheeling in the right upper extremity. There is overall very mild bradykinesia. There is no drift or rebound.  There is an intermittent RLE resting tremor.   Romberg is negative.  Reflexes are 2+ in the upper extremities and 2+ in the lower extremities. Toes are downgoing bilaterally.  Fine  motor skills exam: Finger taps are mildly impaired on the right and not impaired on the left. Hand movements are mildly impaired on the right and not impaired on the left. RAP (rapid alternating patting) is minimally impaired on the right and not impaired on the left. Foot taps are mildly impaired on the right and minimally impaired on the left. Foot agility (in the form of heel stomping) is mildly impaired on the right and minimally impaired on the left.    Cerebellar testing shows no dysmetria or intention tremor on finger to nose testing. Heel to shin is unremarkable bilaterally. There is no truncal or gait ataxia.   Sensory exam is intact to light touch, pinprick, vibration, temperature sense in the upper and lower extremities.   Gait, station and balance: She stands up from the seated position with minimal difficulty and does not need to push up with Her hands. She needs no assistance. No veering to one side is noted.  She is not noted to lean to the side. Posture is mildly stooped. Stance is narrow-based. She walks with decrease in stride length and pace and decreased arm swing on the right. She turns in 3 steps. Tandem walk is possible with difficulty. Balance is preserved. She is able to do a toe or heel stance.     Assessment and Plan:   In summary, Amanda [name] is a very pleasant 68 year old female with a history of Parkinson's disease, right-sided predominant. Her physical exam is stable and has not progressed in the last 6 months. She is doing fairly well at this time and I reassured the patient in that regard.  I had a long chat with the patient about Her symptoms, my findings and the diagnosis of parkinsonism/Parksinson's disease, its prognosis and treatment options. We talked about medical treatments and non-pharmacological approaches. We talked about maintaining a healthy lifestyle in general. I also talked to her about re-starting her exercise program. She is not keen on adding any new medication. I was considering a dopamine agonist but she wants to step up her exercise program for now. I encouraged the patient to eat healthy, exercise daily and keep well hydrated, to keep a scheduled bedtime and wake time routine, to not skip any meals and eat healthy snacks in between meals and to have protein with every meal. In particular, I stressed the importance of regular exercise, within of course the patient's own mobility limitations.   As far as further diagnostic testing is concerned, I suggested: no change.  As far as medications are concerned, I recommended the following at this time: no change. She will continue with Azilect 1 mg once daily. I answered all her questions today and the patient was in agreement with the above outlined plan. I would like to see the patient back in 6 months, sooner if the need arises and encouraged her to call with any interim questions, concerns, problems or updates and refill  requests

## 2013-09-23 NOTE — Patient Instructions (Signed)
I think your Parkinson's disease has remained fairly stable, which is reassuring. Nevertheless, as you know, this disease does progress with time. It can affect your balance, your memory, your mood, your bowel and bladder function, your posture, balance and walking. Overall you are doing fairly well but I do want to suggest a few things today:  Remember to drink plenty of fluid, eat healthy meals and do not skip any meals. Try to eat protein with a every meal and eat a healthy snack such as fruit or nuts in between meals. Try to keep a regular sleep-wake schedule and try to exercise daily, particularly in the form of walking, 20-30 minutes a day, if you can.   Taking your medication on schedule is key.   Try to stay active physically and mentally. Engage in social activities in your community and with your family and try to keep up with current events by reading the newspaper or watching the news. Try to do word puzzles and you may like to do word puzzles and brain games on the computer such as on [email or web address]   As far as your medications are concerned, I would like to suggest that you take your current medication with the following additional changes: no new changes today.     As far as diagnostic testing, I will order: no new test today.  I would like to see you back in 6 months, sooner if we need to. Please call us with any interim questions, concerns, problems, updates or refill requests.  Our nursing staff will answer any of your questions and relay your messages to me and also relay most of my messages to you.  Our phone number is [phone]. We also have an after hours call service for urgent matters and there is a physician on-call for urgent questions, that cannot wait till the next work day. For any emergencies you know to call 911 or go to the nearest emergency room.

## 2013-09-25 ENCOUNTER — Ambulatory Visit: Payer: Medicare Other | Admitting: Neurology

## 2013-10-21 ENCOUNTER — Other Ambulatory Visit: Payer: Self-pay

## 2013-10-21 DIAGNOSIS — G20 Parkinson's disease: Secondary | ICD-10-CM

## 2013-10-21 MED ORDER — RASAGILINE MESYLATE 1 MG PO TABS: 1.0000 mg | ORAL_TABLET | Freq: Every day | ORAL | Status: DC

## 2013-11-20 ENCOUNTER — Other Ambulatory Visit: Payer: Self-pay

## 2013-11-20 DIAGNOSIS — Z1231 Encounter for screening mammogram for malignant neoplasm of breast: Secondary | ICD-10-CM

## 2013-12-04 ENCOUNTER — Ambulatory Visit
Admission: RE | Admit: 2013-12-04 | Discharge: 2013-12-04 | Disposition: A | Discharge status: Home / Self Care | Payer: Medicare Other | Source: Ambulatory Visit

## 2013-12-04 DIAGNOSIS — Z1231 Encounter for screening mammogram for malignant neoplasm of breast: Secondary | ICD-10-CM | POA: Diagnosis not present

## 2013-12-19 NOTE — Telephone Encounter (Signed)
Closing encounter

## 2013-12-26 DIAGNOSIS — B353 Tinea pedis: Secondary | ICD-10-CM | POA: Diagnosis not present

## 2013-12-26 DIAGNOSIS — L2089 Other atopic dermatitis: Secondary | ICD-10-CM | POA: Diagnosis not present

## 2013-12-26 DIAGNOSIS — D2370 Other benign neoplasm of skin of unspecified lower limb, including hip: Secondary | ICD-10-CM | POA: Diagnosis not present

## 2013-12-26 DIAGNOSIS — L821 Other seborrheic keratosis: Secondary | ICD-10-CM | POA: Diagnosis not present

## 2013-12-26 DIAGNOSIS — D1801 Hemangioma of skin and subcutaneous tissue: Secondary | ICD-10-CM | POA: Diagnosis not present

## 2013-12-26 DIAGNOSIS — B351 Tinea unguium: Secondary | ICD-10-CM | POA: Diagnosis not present

## 2013-12-26 DIAGNOSIS — L820 Inflamed seborrheic keratosis: Secondary | ICD-10-CM | POA: Diagnosis not present

## 2014-01-02 ENCOUNTER — Telehealth: Payer: Self-pay | Admitting: Family Medicine

## 2014-01-02 NOTE — Telephone Encounter (Signed)
Patient called stating her arm still hurts from when she received her flu shot in October. She would like to know what she should do.

## 2014-01-02 NOTE — Telephone Encounter (Signed)
Spoke with patient who states that the muscle in the front of her arm has been sore since she received her Flu shot in October. Patient stated the "muscle at the top of the outside of my arm started hurting a couple of days after the shot was given." When I questioned her further about the arm pain , she said it was the muscle on the front, inside part of my arm is the one that had been hurting since October. (described bicep) Patient states stretching exercises helps for a little while. Appt scheduled for 01/09/14 with Dr. Etter Sjogren

## 2014-01-09 ENCOUNTER — Telehealth: Payer: Self-pay | Admitting: Family Medicine

## 2014-01-09 ENCOUNTER — Telehealth: Payer: Self-pay | Admitting: Neurology

## 2014-01-09 ENCOUNTER — Ambulatory Visit (INDEPENDENT_AMBULATORY_CARE_PROVIDER_SITE_OTHER): Payer: Medicare Other | Admitting: Family Medicine

## 2014-01-09 ENCOUNTER — Encounter: Payer: Self-pay | Admitting: Family Medicine

## 2014-01-09 VITALS — BP 108/72 | HR 73 | Temp 98.5°F | Ht 64.25 in | Wt 114.8 lb

## 2014-01-09 DIAGNOSIS — M25529 Pain in unspecified elbow: Secondary | ICD-10-CM

## 2014-01-09 DIAGNOSIS — E559 Vitamin D deficiency, unspecified: Secondary | ICD-10-CM | POA: Diagnosis not present

## 2014-01-09 DIAGNOSIS — R809 Proteinuria, unspecified: Secondary | ICD-10-CM

## 2014-01-09 LAB — BASIC METABOLIC PANEL
BUN: 19 mg/dL (ref 6–23)
CO2: 29 mEq/L (ref 19–32)
Calcium: 9.3 mg/dL (ref 8.4–10.5)
Chloride: 104 mEq/L (ref 96–112)
Creatinine, Ser: 0.8 mg/dL (ref 0.4–1.2)
GFR: 71.96 mL/min (ref >60.00)
GLUCOSE: 51 mg/dL — AB (ref 70–99)
POTASSIUM: 3.8 meq/L (ref 3.5–5.1)
Sodium: 141 mEq/L (ref 135–145)

## 2014-01-09 NOTE — Telephone Encounter (Signed)
Patient wanted to know if her eating right before her labs were drawn will affect her PTH results.  Spoke with Sheena in the Lab.  Amanda [name] stated that she was already aware and so was Dr. Etter Sjogren, and that her labs should not affected.  This information was relayed back to the patient.  Patient stated understanding and no further questions or concerns were voiced.

## 2014-01-09 NOTE — Patient Instructions (Signed)
Parathyroid Hormone This is a test to determine whether PTH levels are responding normally to changes in blood calcium levels. It also helps to distinguish the cause of calcium imbalances, and to evaluate parathyroid function. When calcium blood levels are higher or lower than normal, and when your caregiver may want to determine how well your parathyroid glands are working. Parathyroid hormone (PTH) helps the body maintain stable levels of calcium in the blood. It is part of a 'feedback loop' that includes calcium, PTH, vitamin D, and, to some extent, phosphate and magnesium. Conditions and diseases that disrupt this feedback loop can cause inappropriate elevations or decreases in calcium and PTH levels and lead to symptoms of hypercalcemia (raised blood levels of calcium) or hypocalcemia (low blood levels of calcium).  PTH is produced by four parathyroid glands that are located in the neck beside the thyroid gland. Normally, these glands secrete PTH into the bloodstream in response to low blood calcium levels. Parathyroid hormone then works in three ways to help raise blood calcium levels back to normal. It takes calcium from the body's bone, stimulates the activation of vitamin D in the kidney (which in turn increases the absorption of calcium from the intestines), and suppresses the excretion of calcium in the urine (while encouraging excretion of phosphate). As calcium levels begin to increase in the blood, PTH normally decreases. PREPARATION FOR TEST You should have nothing to eat or drink except for water after midnight on the day of the test or as directed by your caregiver. A blood sample is obtained by inserting a needle into a vein in the arm. NORMAL FINDINGS Conventional Normal  PTH intact (whole)  Assay includes intact PTH  Values (pg/mL)10-65  SI Units (ng/L)10-65  PTH N-terminalN-terminal  Values (pg/mL) 8-24  SI Units (ng/L)8-24  PTH C-terminal  Assay Includes  C-terminal  Values (pg/mL) 50-330  SI Units (ng/L) 50-330  Intact PTH  Midmolecule Ranges for normal findings may vary among different laboratories and hospitals. You should always check with your doctor after having lab work or other tests done to discuss the meaning of your test results and whether your values are considered within normal limits. MEANING OF TEST  Your caregiver will go over the test results with you and discuss the importance and meaning of your results, as well as treatment options and the need for additional tests if necessary. OBTAINING THE TEST RESULTS  It is your responsibility to obtain your test results. Ask the lab or department performing the test when and how you will get your results. Document Released: 10/08/2004 Document Revised: 11/28/2011 Document Reviewed: 08/17/2008 [employer] Patient Information 2014 [employer].  

## 2014-01-09 NOTE — Progress Notes (Signed)
   Subjective:    Patient ID: Amanda [name], female    DOB: 01/08/1946, 68 y.o.   MRN: [id number]  HPI Pt here c/o L arm pain and her 2 daughters were diagnosed with elevated vita D and one has hyperparathyroidism.  Pt would like to be screened for this.   She is also c/o muscle in L arm his a lot bigger than R arm and she is R hand dominant.     Review of Systems As above    Objective:   Physical Exam  BP 108/72  Pulse 73  Temp(Src) 98.5 F (36.9 C) (Oral)  Ht 5' 4.25" (1.632 m)  Wt 114 lb 12.8 oz (52.073 kg)  BMI 19.55 kg/m2  SpO2 97% General appearance: alert, cooperative, appears stated age and no distress Head: Normocephalic, without obvious abnormality, atraumatic Neck: no adenopathy, supple, symmetrical, trachea midline and thyroid not enlarged, symmetric, no tenderness/mass/nodules Lungs: clear to auscultation bilaterally Heart: regular rate and rhythm, S1, S2 normal, no murmur, click, rub or gallop Extremities: ? swelling in bicep L arm--tender to palpation      Assessment & Plan:  1. Unspecified vitamin D deficiency Check labs - Vitamin D 1,25 dihydroxy - PTH, Intact and Calcium - Basic metabolic panel  2. Pain in joint, upper arm Maybe related to labs but pt will also discuss with her Neuro--MS

## 2014-01-09 NOTE — Telephone Encounter (Signed)
Pt called states she recd flu shot back in Oct but has been having trouble with her arm ever since (lifting things, moving a certain way) Muscle looks bigger than the muscle on the other arm. PCP is referring [facility] to [facility]. Hyper Parathyroid Hormone has been found in both daughters and her PCP feels like pt may have same thing. Pt wanted Dr. Rexene Alberts to know and wanted to find out when Dr. Rexene Alberts did lab work did she check for Vitamin D1,25 Diydroxy PTH, intact calcium, and CBC. Pt would like for Dr. Rexene Alberts to please call her back concerning this matter. Thanks

## 2014-01-09 NOTE — Telephone Encounter (Signed)
Patient called to see if she was ever tested for PTH before and to discuss her concerns with a nurse. Please advise

## 2014-01-09 NOTE — Telephone Encounter (Signed)
Patient is wanting Dr Guadelupe Sabin opinion on this and could this be a symptom of patient's parkinson's or a symptom of hyper parathyroid hormone?

## 2014-01-09 NOTE — Progress Notes (Signed)
Pre visit review using our clinic review tool, if applicable. No additional management support is needed unless otherwise documented below in the visit note. 

## 2014-01-10 NOTE — Telephone Encounter (Signed)
I did not do any lab work last time and the lab tests she is mentioning were checked by her primary care physician. I do not have a better explanation for her symptoms and would follow the recommendations by her primary care physician.

## 2014-01-10 NOTE — Telephone Encounter (Signed)
Shared Dr Guadelupe Sabin message with patient , she verbalized understanding and said that she will call back if any more questions.

## 2014-01-13 LAB — PTH, INTACT AND CALCIUM
Calcium: 9.6 mg/dL (ref 8.4–10.5)
PTH: 47.7 pg/mL (ref 14.0–72.0)

## 2014-01-14 LAB — VITAMIN D 1,25 DIHYDROXY
VITAMIN D3 1, 25 (OH): 47 pg/mL
Vitamin D 1, 25 (OH)2 Total: 47 pg/mL (ref 18–72)
Vitamin D2 1, 25 (OH)2: <8 pg/mL

## 2014-01-22 ENCOUNTER — Telehealth: Payer: Self-pay | Admitting: *Deleted

## 2014-01-22 DIAGNOSIS — M791 Myalgia, unspecified site: Secondary | ICD-10-CM

## 2014-01-22 DIAGNOSIS — M79629 Pain in unspecified upper arm: Secondary | ICD-10-CM

## 2014-01-22 NOTE — Telephone Encounter (Signed)
Caller name:  Dimitri Relation to pt:  self Call back number:  [phone] or [phone] Pharmacy:  Reason for call:  Pt called, per her last visit she states Dr. Etter Sjogren told her that she could go see "Dr. Tamala Julian" about the muscle hurting in her left upper arm.  She is still having issues with her muscle hurting.  Pt also wants to know what her Vitamin D was.

## 2014-01-22 NOTE — Telephone Encounter (Signed)
Ok to put referral in and  Vita d was low normal --- if she is already taking otc vita D we can add rx vita D 50,000 1 po qweek  #4   2 refills------- then con't otc vita D3 2000u daily

## 2014-01-22 NOTE — Telephone Encounter (Signed)
Please advise      KP 

## 2014-01-23 NOTE — Telephone Encounter (Signed)
Spoke with patient and she declined the 50,000 units of vitamin D, she said she wants to use a more natural, however she has agreed to take 2,000 units.    KP

## 2014-01-27 ENCOUNTER — Ambulatory Visit: Payer: Medicare Other | Admitting: Family Medicine

## 2014-01-27 DIAGNOSIS — Z0289 Encounter for other administrative examinations: Secondary | ICD-10-CM

## 2014-02-18 ENCOUNTER — Ambulatory Visit (INDEPENDENT_AMBULATORY_CARE_PROVIDER_SITE_OTHER): Payer: Medicare Other | Admitting: Family Medicine

## 2014-02-18 ENCOUNTER — Other Ambulatory Visit (INDEPENDENT_AMBULATORY_CARE_PROVIDER_SITE_OTHER): Payer: Medicare Other

## 2014-02-18 ENCOUNTER — Encounter: Payer: Self-pay | Admitting: Family Medicine

## 2014-02-18 VITALS — BP 100/64 | HR 59 | Ht 64.5 in | Wt 115.0 lb

## 2014-02-18 DIAGNOSIS — M79602 Pain in left arm: Secondary | ICD-10-CM

## 2014-02-18 DIAGNOSIS — M7520 Bicipital tendinitis, unspecified shoulder: Secondary | ICD-10-CM | POA: Diagnosis not present

## 2014-02-18 DIAGNOSIS — M79609 Pain in unspecified limb: Secondary | ICD-10-CM

## 2014-02-18 DIAGNOSIS — M7522 Bicipital tendinitis, left shoulder: Secondary | ICD-10-CM

## 2014-02-18 NOTE — Patient Instructions (Signed)
Good to meet you I would consider vitamin D at 2000 IU daily.  Turmeric 500mg  twice daily to decrease inflammation.  With your weight loss, would make sure taking enough protein. You need 50 grams of protein daily.  Meat, dairy, nuts. If trouble then whey protein isolate.  Ice 20 minutes daily to area Try exercises most days of the week.  Come back in 3 weeks.

## 2014-02-18 NOTE — Progress Notes (Signed)
[facility] [place],  [address] Phone: [phone] Subjective:    I'm seeing this patient by the request  of:  Garnet Koyanagi, DO  CC: Left shoulder pain  [id number] Amanda [name] is a 68 y.o. female coming in with complaint of left shoulder and arm pain. Patient states that she's had this pain since October when she did have a flu shot. Patient's past medical history is significant for Parkinson's disease and is not on any disease modifying agents. Patient states that she has been using the arm to hold her grandchild similar. Patient states it is sore seems to be on the anterior aspect of the arm. Patient denies any shoulder pain is associated with elbow. Patient states is all in the anterior aspect of the arm. Denies any discoloration any numbness but states that she may have some weakness. Patient does have a tremor on the contralateral arm she states but not as much on his left arm. Patient states that the severity of his pain is 6/10. Patient is very concerned that this is associated to the flu shot she got back in October once again.     Past medical history, social, surgical and family history all reviewed in electronic medical record.   Review of Systems: No headache, visual changes, nausea, vomiting, diarrhea, constipation, dizziness, abdominal pain, skin rash, fevers, chills, night sweats, weight loss, swollen lymph nodes, body aches, joint swelling, muscle aches, chest pain, shortness of breath, mood changes.   Objective Blood pressure 100/64, pulse 59, height 5' 4.5" (1.638 m), weight 115 lb (52.164 kg), SpO2 98.00%.  General: No apparent distress alert and oriented x3 mood and affect normal, dressed appropriately. The masked facies with a pill rolling tremor of the right hand  HEENT: Pupils equal, extraocular movements intact  Respiratory: Patient's speak in full sentences and does not appear short of breath  Cardiovascular: No  lower extremity edema, non tender, no erythema  Skin: Warm dry intact with no signs of infection or rash on extremities or on axial skeleton.  Abdomen: Soft nontender  Neuro: Cranial nerves II through XII are intact, neurovascularly intact in all extremities with 2+ DTRs and 2+ pulses.  Lymph: No lymphadenopathy of posterior or anterior cervical chain or axillae bilaterally.  Gait normal with good balance and coordination.  MSK:  Non tender with full range of motion and good stability and symmetric strength and tone of  elbows, wrist, hip, knee and ankles bilaterally. Patient does have cogwheeling some of her muscles and hypertonicity.  Shoulder: Left Inspection reveals no abnormalities, atrophy or asymmetry. Palpation is normal with no tenderness over AC joint or bicipital groove. ROM is full in all planes. Rotator cuff strength normal throughout. No signs of impingement with negative Neer and Hawkin's tests, empty can sign. Speeds and Yergason's tests positive. No labral pathology noted with negative Obrien's, negative clunk and good stability. Normal scapular function observed. No painful arc and no drop arm sign. No apprehension sign  MSK US performed of: Left This study was ordered, performed, and interpreted by Charlann Boxer D.O.  Shoulder:   Supraspinatus:  Appears normal on long and transverse views, no bursal bulge seen with shoulder abduction on impingement view. Infraspinatus:  Appears normal on long and transverse views. Subscapularis:  Appears normal on long and transverse views. Teres Minor:  Appears normal on long and transverse views. AC joint:  Capsule undistended, no geyser sign. Glenohumeral Joint:  Appears normal without effusion. Glenoid Labrum:  Intact without visualized tears. Biceps Tendon: Does have some mild scarring at the muscular tendon juncture distally. There is no true tear appreciated at this time. Patient also has what appears to be a myositis of the  bicep muscle compared to the surrounding musculature. This does have some mild increased hypoechoic changes as well as increasing Doppler flow.  Impression: Distal biceps tendinitis with a myositis.       Impression and Recommendations:     This case required medical decision making of moderate complexity.

## 2014-02-18 NOTE — Assessment & Plan Note (Signed)
Patient does have a couple injuries are likely multifactorial. Patient's underlying Parkinson's disease and weight loss recently is likely contributing. I think she is having myositis due to the muscle breakdown at this time. Patient body is likely the hypermetabolic state secondary to Parkinson's. We discussed nutritional supplementation that could also be very helpful. In addition to this we discussed icing and patient was given a compression sleeve was fitted by me today. We discussed over-the-counter medications that could be more beneficial. He underwent do any prescription medications per patient. Patient will try this as well as a home exercise program and come back again in 3 weeks for further evaluation.

## 2014-03-07 ENCOUNTER — Telehealth: Payer: Self-pay | Admitting: Family Medicine

## 2014-03-07 DIAGNOSIS — R809 Proteinuria, unspecified: Secondary | ICD-10-CM

## 2014-03-07 NOTE — Telephone Encounter (Signed)
I will do it this time because schedule is full but next time she will need ov

## 2014-03-07 NOTE — Telephone Encounter (Signed)
Caller name: [name] Relation to pt: self Call back number: [phone]   Reason for call:  Pt states she has a UTI and wants to get a urine cup so she can have it tested and she wants antibiotics.  Pt stated husband is on his way to pick up a urine cup.  Advised that pt may need to be seen and she indicated that she shouldn't need to be seen for a UTI.  Please advise.

## 2014-03-07 NOTE — Addendum Note (Signed)
Addended by: Peggyann Shoals on: 03/07/2014 05:20 PM   Modules accepted: Orders

## 2014-03-07 NOTE — Addendum Note (Signed)
Addended by: Peggyann Shoals on: 03/07/2014 05:25 PM   Modules accepted: Orders

## 2014-03-07 NOTE — Telephone Encounter (Signed)
Pt called in to see if lab results were ready from the urine that was dropped off at 1:00 today.  Informed pt that someone would contact her when it was complete.

## 2014-03-07 NOTE — Telephone Encounter (Signed)
Husband has came into the office for a Urine cup because wife wants to be tested. Please advise     KP

## 2014-03-10 LAB — URINE CULTURE

## 2014-03-10 NOTE — Telephone Encounter (Signed)
Notes Recorded by Rosalita Chessman, DO on 03/10/2014 at 12:55 PM + Uti-- cipro 500 mg 1 po bid for 5 days

## 2014-03-10 NOTE — Telephone Encounter (Signed)
Spoke with patient and Dr.Smith already treated her with Cipro this weekend.    KP

## 2014-03-11 ENCOUNTER — Ambulatory Visit (INDEPENDENT_AMBULATORY_CARE_PROVIDER_SITE_OTHER): Payer: Medicare Other | Admitting: Family Medicine

## 2014-03-11 ENCOUNTER — Encounter: Payer: Self-pay | Admitting: Family Medicine

## 2014-03-11 VITALS — BP 110/62 | HR 63 | Ht 64.5 in | Wt 113.0 lb

## 2014-03-11 DIAGNOSIS — M7520 Bicipital tendinitis, unspecified shoulder: Secondary | ICD-10-CM

## 2014-03-11 DIAGNOSIS — M7522 Bicipital tendinitis, left shoulder: Secondary | ICD-10-CM

## 2014-03-11 MED ORDER — DICLOFENAC SODIUM 2 % TD SOLN: 2.0000 "application " | Freq: Two times a day (BID) | TRANSDERMAL | Status: DC

## 2014-03-11 NOTE — Progress Notes (Signed)
  [facility] [address] [place], [place] [address] Phone: [phone] Subjective:    I'm seeing this patient by the request  of:  Garnet Koyanagi, DO  CC: Left shoulder pain follow up.   [id number] Amanda [name] is a 68 y.o. female coming in with complaint of left shoulder and arm pain. Patient was seen previously and was diagnosed with more of a distal biceps tendinitis as well as myositis. Patient's knee was given a compression sleeve and was given a exercises which she was not doing. Patient was also supposed to do icing protocol which she forgot about. Patient likely states that she is about 60-70% better. Patient states that she is able to do more activities and states that it is feeling better. Patient states though she did play with her grandkids afterward does notice some soreness. Overall better    Past medical history, social, surgical and family history all reviewed in electronic medical record.   Review of Systems: No headache, visual changes, nausea, vomiting, diarrhea, constipation, dizziness, abdominal pain, skin rash, fevers, chills, night sweats, weight loss, swollen lymph nodes, body aches, joint swelling, muscle aches, chest pain, shortness of breath, mood changes.   Objective Blood pressure 110/62, pulse 63, height 5' 4.5" (1.638 m), weight 113 lb (51.256 kg), SpO2 98.00%.  General: No apparent distress alert and oriented x3 mood and affect normal, dressed appropriately. The masked facies with a pill rolling tremor of the right hand  HEENT: Pupils equal, extraocular movements intact  Respiratory: Patient's speak in full sentences and does not appear short of breath  Cardiovascular: No lower extremity edema, non tender, no erythema  Skin: Warm dry intact with no signs of infection or rash on extremities or on axial skeleton.  Abdomen: Soft nontender  Neuro: Cranial nerves II through XII are intact, neurovascularly intact in all extremities  with 2+ DTRs and 2+ pulses.  Lymph: No lymphadenopathy of posterior or anterior cervical chain or axillae bilaterally.  Gait normal with good balance and coordination.  MSK:  Non tender with full range of motion and good stability and symmetric strength and tone of  elbows, wrist, hip, knee and ankles bilaterally. Patient does have cogwheeling some of her muscles and hypertonicity.  Shoulder: Left Inspection reveals no abnormalities, atrophy or asymmetry. Palpation is normal with no tenderness over AC joint or bicipital groove. ROM is full in all planes. Rotator cuff strength normal throughout. No signs of impingement with negative Neer and Hawkin's tests, empty can sign. Speeds and Yergason's tests positive. No labral pathology noted with negative Obrien's, negative clunk and good stability. Normal scapular function observed. No painful arc and no drop arm sign. No apprehension sign    Impression and Recommendations:     This case required medical decision making of moderate complexity.

## 2014-03-11 NOTE — Patient Instructions (Addendum)
Good to see you Ice still is your best friend.  Wear the compression with activity You are doing better.   Continue the vitamin D 2000 IU daily.  Come back again in 4-6 weeks to make sure it is completely gone.

## 2014-03-11 NOTE — Assessment & Plan Note (Addendum)
Patient has made good improvement. Overall she will continue with the compression sleeve, and icing, as well as a home exercises. I think she is doing relatively well overall. I did not notice any new findings I think patient's swelling that she had previously is completely dissipated. Patient encouraged to continue the exercises and will come back again in 4-6 weeks for further evaluation and treatment.  Spent greater than 25 minutes with patient face-to-face and had greater than 50% of counseling including as described above in assessment and plan.

## 2014-03-25 ENCOUNTER — Encounter: Payer: Self-pay | Admitting: Neurology

## 2014-03-25 ENCOUNTER — Ambulatory Visit (INDEPENDENT_AMBULATORY_CARE_PROVIDER_SITE_OTHER): Payer: Medicare Other | Admitting: Neurology

## 2014-03-25 VITALS — BP 118/64 | HR 68 | Temp 97.7°F | Ht 64.5 in | Wt 115.0 lb

## 2014-03-25 DIAGNOSIS — G20 Parkinson's disease: Secondary | ICD-10-CM | POA: Diagnosis not present

## 2014-03-25 DIAGNOSIS — F3289 Other specified depressive episodes: Secondary | ICD-10-CM

## 2014-03-25 DIAGNOSIS — F329 Major depressive disorder, single episode, unspecified: Secondary | ICD-10-CM

## 2014-03-25 DIAGNOSIS — F32A Depression, unspecified: Secondary | ICD-10-CM

## 2014-03-25 DIAGNOSIS — F411 Generalized anxiety disorder: Secondary | ICD-10-CM | POA: Diagnosis not present

## 2014-03-25 MED ORDER — ESCITALOPRAM OXALATE 5 MG PO TABS: 5.0000 mg | ORAL_TABLET | Freq: Every day | ORAL | Status: DC

## 2014-03-25 MED ORDER — RASAGILINE MESYLATE 1 MG PO TABS: 1.0000 mg | ORAL_TABLET | Freq: Every day | ORAL | Status: DC

## 2014-03-25 NOTE — Progress Notes (Signed)
Subjective:    Patient ID: Amanda [name] is a 68 y.o. female.  HPI    Interim history:   Amanda [name] is a very pleasant 68 year old right-handed woman with an underlying medical history of vitamin D deficiency, osteopenia, hyperlipidemia, and anxiety, who presents for followup consultation of her right sided predominant Parkinson's disease. She is accompanied by her husband today. I last saw her on 09/23/2013, at which time I felt she was stable and continued her on Azilect once daily. In the interim she was seen by Dr. Tamala Julian in sports medicine because of left arm pain since October 2014 after a flu shot; she has since then improved in that regard, using a compression bandage, which he provided.  Today, she reports feeling fairly stable. She is going to start using Pennsaid (diclofenac topical) for her L upper arm pain, as prescribed by Dr. Tamala Julian. She has also been advised to start tumeric. She had a recent UTI and was treated with cipro. Her husband feels that she is depressed and anxious. She had tried an antidepressant in the past, but came off of it. She feels, she is not depressed and admits to not exercising regularly and wants to try to exercise.   I first met her on 03/27/2013 at which time I suggested no new medication with the exception of consideration of adding coenzyme Q 10 over-the-counter. She has been on Azilect.  She previously followed with Dr. Morene Antu and was last seen by him on 11/21/2012, at which time Dr. Erling Cruz felt that she was doing very well. She has had some increase in tremors over time. He did not suggest any medication changes at the time.   She first noticed a right hand tremor in April 2010 followed by a right leg tremor in June 2010. MRI brain with and without contrast showed nonspecific white matter hyperintensities, 24-hour urine for heavy metals was negative. CBC, CMP, TSH, RPR, ESR and serum ceruloplasmin levels were normal. She started rasagiline in November 2010  and tolerated it well. There has been some concern about her memory. She denies any hallucinations, depression or orthostatic hypotension. She does not exercise regularly.   Her Past Medical History Is Significant For: Past Medical History  Diagnosis Date  . Anxiety   . Osteopenia   . Runner's knee   . Parkinson disease     Her Past Surgical History Is Significant For: Past Surgical History  Procedure Laterality Date  . Ovarian cyst removal  11-2006  . Laparoscopy      Her Family History Is Significant For: Family History  Problem Relation Age of Onset  . Heart disease Mother     enlarged heart  . Cancer Father     pancreatic  . COPD Sister   . Pulmonary embolism Sister   . Heart disease Brother     Her Social History Is Significant For: History   Social History  . Marital Status: Married    Spouse Name: Chrissie Noa    Number of Children: 3  . Years of Education: College   Occupational History  . retired    Social History Main Topics  . Smoking status: Never Smoker   . Smokeless tobacco: Never Used  . Alcohol Use: Yes     Comment: 1 glass of wine occasionally  . Drug Use: No  . Sexual Activity: Yes    Partners: Male   Other Topics Concern  . None   Social History Narrative   Pt lives at home with  family.   Caffeine Use: some, occasionally    Her Allergies Are:  Allergies  Allergen Reactions  . Penicillins   :   Her Current Medications Are:  Outpatient Encounter Prescriptions as of 03/25/2014  Medication Sig  . b complex vitamins tablet Take 1 tablet by mouth daily.    . Calcium Carbonate-Vitamin D (CALCIUM + D PO) Take 2 capsules by mouth 2 (two) times daily. Grow Bone Calcium Tablet  . NON FORMULARY Progesterone and Bi-est Cream as directed   . Nutritional Supplements (DHEA PO) Take 5 mg by mouth daily.  . Omega-3 Fatty Acids (FISH OIL) 1200 MG CAPS Take by mouth daily.    . rasagiline (AZILECT) 1 MG TABS tablet Take 1 tablet (1 mg total) by mouth  daily.  . TURMERIC PO Take 2 capsules by mouth daily.  . ciclopirox (LOPROX) 0.77 % cream   . Diclofenac Sodium (PENNSAID) 2 % SOLN Place 2 application onto the skin 2 (two) times daily.  :  Review of Systems:  Out of a complete 14 point review of systems, all are reviewed and negative with the exception of these symptoms as listed below:   Review of Systems  Constitutional: Negative.   HENT: Negative.   Eyes: Negative.   Respiratory: Negative.   Cardiovascular: Negative.   Gastrointestinal: Negative.   Endocrine: Negative.   Genitourinary: Negative.   Musculoskeletal: Negative.   Skin: Negative.   Allergic/Immunologic: Negative.   Neurological: Negative.   Hematological: Negative.   Psychiatric/Behavioral: Negative.   All other systems reviewed and are negative.  Objective:  Neurologic Exam  Physical Exam Physical Examination:   Filed Vitals:   03/25/14 1119  BP: 118/64  Pulse: 68  Temp: 97.7 F (36.5 C)   General Examination: The patient is a very pleasant 68 y.o. female in no acute distress.   HEENT: Normocephalic, atraumatic, pupils are equal, round and reactive to light and accommodation. Funduscopic exam is normal with sharp disc margins noted. Extraocular tracking shows mild saccadic breakdown without nystagmus noted. There is limitation to upper gaze. There is mild decrease in eye blink rate. Hearing is intact. Face is symmetric with mild facial masking and normal facial sensation. There is no lip, neck or jaw tremor. Neck is mildly rigid with intact passive ROM. There are no carotid bruits on auscultation. Oropharynx exam reveals mild mouth dryness. No significant airway crowding is noted. Mallampati is class I. Tongue protrudes centrally and palate elevates symmetrically. There is no drooling.   Chest: is clear to auscultation without wheezing, rhonchi or crackles noted.  Heart: sounds are regular and normal without murmurs, rubs or gallops noted.   Abdomen:  is soft, non-tender and non-distended with normal bowel sounds appreciated on auscultation.  Extremities: There is no pitting edema in the distal lower extremities bilaterally. Pedal pulses are intact. There are no varicose veins.  Skin: is warm and dry with no trophic changes noted.    Musculoskeletal: exam reveals no obvious joint deformities, tenderness, joint swelling or erythema.  Neurologically:  Mental status: The patient is awake and alert, paying good  attention. She is able to completely provide the history. She is oriented to: person, place, time/date, situation, day of week, month of year and year. Her memory, attention, language and knowledge are intact. There is no aphasia, agnosia, apraxia or anomia. There is a no significant degree of bradyphrenia. Speech is very mildly hypophonic with no dysarthria noted. Mood is congruent and affect is normal.    Cranial nerves  are as described above under HEENT exam. In addition, shoulder shrug is normal with equal shoulder height noted.  Motor exam: Normal bulk, and strength for age is noted. There are no dyskinesias noted.  Tone is mildly rigid with presence of cogwheeling in the right upper extremity. There is overall very mild bradykinesia. There is no drift or rebound.  There is an intermittent, mild RUE and RLE resting tremor.   Romberg is negative.  Reflexes are 2+ in the upper extremities and 2+ in the lower extremities. Toes are downgoing bilaterally.  Fine motor skills exam: Finger taps are mildly impaired on the right and not impaired on the left. Hand movements are mildly impaired on the right and not impaired on the left. RAP (rapid alternating patting) is minimally impaired on the right and not impaired on the left. Foot taps are mildly impaired on the right and minimally impaired on the left. Foot agility (in the form of heel stomping) is mildly impaired on the right and minimally impaired on the left.    Cerebellar testing shows  no dysmetria or intention tremor on finger to nose testing. Heel to shin is unremarkable bilaterally. There is no truncal or gait ataxia.   Sensory exam is intact to light touch, pinprick, vibration, temperature sense in the upper and lower extremities.   Gait, station and balance: She stands up from the seated position with minimal difficulty and does not need to push up with Her hands. She needs no assistance. No veering to one side is noted. She is not noted to lean to the side. Posture is mildly stooped. Stance is narrow-based. She walks with decrease in stride length and pace and decreased arm swing on the right. She turns in 3 steps. Tandem walk is possible with difficulty. Balance is preserved. She is able to do a toe or heel stance.     Assessment and Plan:   In summary, Terrian Ridlon is a very pleasant 68 year old female with an underlying medical history of vitamin D deficiency, osteopenia, hyperlipidemia, and anxiety, who presents for followup consultation of her right sided predominant Parkinson's disease. Her physical exam is stable and has not progressed in the last 6 months, but she does have anxiety and her husband reports depressive symptoms in her, which she denies. She is advised to consider starting low dose Lexapro 5 mg. I provided her with a prescription for it, in case she decides to start it.  I had a long chat with the patient and her husband about Her symptoms, my findings and the diagnosis of parkinsonism/Parksinson's disease, its prognosis and treatment options. We talked about medical treatments and non-pharmacological approaches. We talked about maintaining a healthy lifestyle in general. I also talked to her about re-starting her exercise program. She is not keen on adding any new medication, but I would like to consider adding a dopamine agonist in the near future, but she wants to step up her exercise program for now. I encouraged the patient to eat healthy, exercise daily  and keep well hydrated, to keep a scheduled bedtime and wake time routine, to not skip any meals and eat healthy snacks in between meals and to have protein with every meal. In particular, I stressed the importance of regular exercise, within of course the patient's own mobility limitations.   As far as further diagnostic testing is concerned, I suggested: no change, unless she decides to add the Lexapro. She will continue with Azilect 1 mg once daily. I answered  all their questions today and the patient was in agreement with the above outlined plan. I would like to see the patient back in 6 months, sooner if the need arises and encouraged her to call with any interim questions, concerns, problems or updates and refill requests.

## 2014-03-25 NOTE — Patient Instructions (Signed)
I think your Parkinson's disease has remained fairly stable, which is reassuring. Nevertheless, as you know, this disease does progress with time. It can affect your balance, your memory, your mood, your bowel and bladder function, your posture, balance and walking. Overall you are doing fairly well but I do want to suggest a few things today:  Remember to drink plenty of fluid, eat healthy meals and do not skip any meals. Try to eat protein with a every meal and eat a healthy snack such as fruit or nuts in between meals. Try to keep a regular sleep-wake schedule and try to exercise daily, particularly in the form of walking, 20-30 minutes a day, if you can.   Taking your medication on schedule is key.   Try to stay active physically and mentally. Engage in social activities in your community and with your family and try to keep up with current events by reading the newspaper or watching the news. Try to do word puzzles and you may like to do word puzzles and brain games on the computer such as on [email or web address]   As far as your medications are concerned, I would like to suggest that you take your current medication with the following additional changes: we may consider another Parkinson's medication in the near future.    As far as diagnostic testing, I will order: no new tests.   I would like to see you back in 6 months, sooner if we need to. Please call us with any interim questions, concerns, problems, updates or refill requests.  Please also call us for any test results so we can go over those with you on the phone. Our nursing staff will answer any of your questions and relay your messages to me and also relay most of my messages to you.  Our phone number is [phone]. We also have an after hours call service for urgent matters and there is a physician on-call for urgent questions, that cannot wait till the next work day. For any emergencies you know to call 911 or go to the nearest emergency  room.

## 2014-04-21 ENCOUNTER — Other Ambulatory Visit: Payer: Self-pay | Admitting: Neurology

## 2014-04-22 ENCOUNTER — Ambulatory Visit: Payer: Medicare Other | Admitting: Family Medicine

## 2014-04-29 ENCOUNTER — Ambulatory Visit (INDEPENDENT_AMBULATORY_CARE_PROVIDER_SITE_OTHER): Payer: Medicare Other | Admitting: Family Medicine

## 2014-04-29 ENCOUNTER — Encounter: Payer: Self-pay | Admitting: Family Medicine

## 2014-04-29 VITALS — BP 89/51 | HR 69 | Ht 64.5 in | Wt 117.0 lb

## 2014-04-29 DIAGNOSIS — M7522 Bicipital tendinitis, left shoulder: Secondary | ICD-10-CM

## 2014-04-29 DIAGNOSIS — M7520 Bicipital tendinitis, unspecified shoulder: Secondary | ICD-10-CM

## 2014-04-29 DIAGNOSIS — H2510 Age-related nuclear cataract, unspecified eye: Secondary | ICD-10-CM | POA: Diagnosis not present

## 2014-04-29 DIAGNOSIS — H35379 Puckering of macula, unspecified eye: Secondary | ICD-10-CM | POA: Diagnosis not present

## 2014-04-29 NOTE — Assessment & Plan Note (Signed)
Patient is doing remarkably better at this time. Encourage her to continue the home exercises as well as the icing protocol. Patient given some range of motion exercises I think for her neck and other extremities that could be beneficial. Discuss the proper allocation protein in her diabetic to be helpful. This patient continues to improve she can follow up on an as-needed basis.

## 2014-04-29 NOTE — Progress Notes (Signed)
  [facility] [place], [place] [address] Phone: [phone] Subjective:    CC: Left shoulder pain follow up.   XBW:[id number] Amanda [name] is a 68 y.o. female coming in with complaint of left shoulder and arm pain. Patient was seen previously and was diagnosed with more of a distal biceps tendinitis as well as myositis. Patient did respond very well to conservative therapy previously and was here for further evaluation to make sure that she continues to improve. Patient states that she is only having minimal pain when she does certain activities but otherwise is able to do all daily activities. Continues to wear the compression sleeve fairly regularly as well as doing exercises. Patient states that she thinks she is about 90% better. Does notice that the muscle seems to fatigue early.     Past medical history, social, surgical and family history all reviewed in electronic medical record.   Review of Systems: No headache, visual changes, nausea, vomiting, diarrhea, constipation, dizziness, abdominal pain, skin rash, fevers, chills, night sweats, weight loss, swollen lymph nodes, body aches, joint swelling, muscle aches, chest pain, shortness of breath, mood changes.   Objective Blood pressure 89/51, pulse 69, height 5' 4.5" (1.638 m), weight 117 lb (53.071 kg), SpO2 98.00%.  General: No apparent distress alert and oriented x3 mood and affect normal, dressed appropriately. The masked facies with a pill rolling tremor of the right hand and mild increasing tremor of the right leg HEENT: Pupils equal, extraocular movements intact  Respiratory: Patient's speak in full sentences and does not appear short of breath  Cardiovascular: No lower extremity edema, non tender, no erythema  Skin: Warm dry intact with no signs of infection or rash on extremities or on axial skeleton.  Abdomen: Soft nontender  Neuro: Cranial nerves II through XII are intact,  neurovascularly intact in all extremities with 2+ DTRs and 2+ pulses.  Lymph: No lymphadenopathy of posterior or anterior cervical chain or axillae bilaterally.  Gait normal with good balance and coordination.  MSK:  Non tender with full range of motion and good stability and symmetric strength and tone of  elbows, wrist, hip, knee and ankles bilaterally. Patient does have cogwheeling some of her muscles and hypertonicity.  Shoulder: Left Inspection reveals no abnormalities, atrophy or asymmetry. Palpation is normal with no tenderness over AC joint or bicipital groove. ROM is full in all planes. Rotator cuff strength normal throughout. No signs of impingement with negative Neer and Hawkin's tests, empty can sign. Speeds and Yergason's tests positive. No labral pathology noted with negative Obrien's, negative clunk and good stability. Normal scapular function observed. No painful arc and no drop arm sign. No apprehension sign Contralateral shoulder unremarkable    Impression and Recommendations:     This case required medical decision making of moderate complexity.

## 2014-04-29 NOTE — Patient Instructions (Signed)
Good to see you You are doing great and need to give yourself a pat on the back.  Still continue the exercises 2-3 times a week and new ones for the neck as well.  Compression when you need it.  Look at muscle milk organic.   Do plant protein only 3 times a week to help with stomach.  As long as it does not get worse see me when you need it.

## 2014-05-16 ENCOUNTER — Encounter: Payer: Self-pay | Admitting: Internal Medicine

## 2014-08-06 ENCOUNTER — Telehealth: Payer: Self-pay | Admitting: Neurology

## 2014-08-06 NOTE — Telephone Encounter (Signed)
Confirmed appt change with patient for 09/25/14

## 2014-09-25 ENCOUNTER — Ambulatory Visit (INDEPENDENT_AMBULATORY_CARE_PROVIDER_SITE_OTHER): Payer: Medicare Other | Admitting: Neurology

## 2014-09-25 ENCOUNTER — Encounter: Payer: Self-pay | Admitting: Neurology

## 2014-09-25 VITALS — BP 98/56 | HR 72 | Temp 97.0°F | Ht 64.5 in | Wt 119.0 lb

## 2014-09-25 DIAGNOSIS — F419 Anxiety disorder, unspecified: Secondary | ICD-10-CM

## 2014-09-25 DIAGNOSIS — F329 Major depressive disorder, single episode, unspecified: Secondary | ICD-10-CM

## 2014-09-25 DIAGNOSIS — G20 Parkinson's disease: Secondary | ICD-10-CM

## 2014-09-25 DIAGNOSIS — F32A Depression, unspecified: Secondary | ICD-10-CM

## 2014-09-25 DIAGNOSIS — G20A1 Parkinson's disease without dyskinesia, without mention of fluctuations: Secondary | ICD-10-CM

## 2014-09-25 NOTE — Progress Notes (Signed)
Subjective:    Patient ID: Amanda [name] is a 69 y.o. female.  HPI     Interim history:   Amanda [name] is a very pleasant 69 year old right-handed woman with an underlying medical history of vitamin D deficiency, osteopenia, hyperlipidemia, and anxiety, who presents for followup consultation of her right sided predominant Parkinson's disease. She is accompanied by her husband again today. I last saw her on 03/25/2014, at which time she reported feeling stable with regards to her motor symptoms. She had a recent UTI was treated with ciprofloxacin. Her husband reported symptoms of depression and anxiety and I suggested she could start a low dose Lexapro and provided her with a prescription for this. She was continued on Azilect. She was given a prescription for diclofenac topical for left upper arm pain from Dr. Tamala Julian.  Today, she reports that her tremor is worse. She never tried the Lexapro, for fear of SEs. Her husband reports short term memory loss. She feels more fatigued. She is questioning the effect of Azilect. She endorses significant stress regarding her husband's health, her son's divorce, taking care of his child. She does not always sleep well. She does not keep a scheduled with her sleep and sometimes stays up all night and sleeps during the day.   I saw her on 09/23/2013, at which time I felt she was stable and continued her on Azilect once daily. In the interim she was seen by Dr. Tamala Julian in sports medicine because of left arm pain since October 2014 after a flu shot; she has since then improved in that regard, using a compression bandage, which he provided.  I first met her on 03/27/2013 at which time I suggested no new medication with the exception of consideration of adding coenzyme Q 10 over-the-counter. She has been on Azilect.    She previously followed with Dr. Morene Antu and was last seen by him on 11/21/2012, at which time Dr. Erling Cruz felt that she was doing very well. She has had  some increase in tremors over time. He did not suggest any medication changes at the time.    She first noticed a right hand tremor in April 2010 followed by a right leg tremor in June 2010. MRI brain with and without contrast showed nonspecific white matter hyperintensities, 24-hour urine for heavy metals was negative. CBC, CMP, TSH, RPR, ESR and serum ceruloplasmin levels were normal. She started rasagiline in November 2010 and tolerated it well. There has been some concern about her memory. She denies any hallucinations, depression or orthostatic hypotension. She does not exercise regularly.   Her Past Medical History Is Significant For: Past Medical History  Diagnosis Date  . Anxiety   . Osteopenia   . Runner's knee   . Parkinson disease     Her Past Surgical History Is Significant For: Past Surgical History  Procedure Laterality Date  . Ovarian cyst removal  11-2006  . Laparoscopy      Her Family History Is Significant For: Family History  Problem Relation Age of Onset  . Heart disease Mother     enlarged heart  . Cancer Father     pancreatic  . COPD Sister   . Pulmonary embolism Sister   . Heart disease Brother     Her Social History Is Significant For: History   Social History  . Marital Status: Married    Spouse Name: Chrissie Noa    Number of Children: 3  . Years of Education: College   Occupational History  .  retired    Social History Main Topics  . Smoking status: Never Smoker   . Smokeless tobacco: Never Used  . Alcohol Use: Yes     Comment: 1 glass of wine occasionally  . Drug Use: No  . Sexual Activity:    Partners: Male   Other Topics Concern  . None   Social History Narrative   Pt lives at home with family.   Caffeine Use: some, occasionally    Her Allergies Are:  Allergies  Allergen Reactions  . Penicillins   :   Her Current Medications Are:  Outpatient Encounter Prescriptions as of 09/25/2014  Medication Sig  . b complex vitamins tablet  Take 1 tablet by mouth daily.    . ciclopirox (LOPROX) 0.77 % cream   . Diclofenac Sodium (PENNSAID) 2 % SOLN Place 2 application onto the skin 2 (two) times daily.  . NON FORMULARY Progesterone and Bi-est Cream as directed   . Nutritional Supplements (DHEA PO) Take 5 mg by mouth daily.  . Omega-3 Fatty Acids (FISH OIL) 1200 MG CAPS Take by mouth daily.    . rasagiline (AZILECT) 1 MG TABS tablet Take 1 tablet (1 mg total) by mouth daily.  . TURMERIC PO Take 2 capsules by mouth daily.  Marland Kitchen UNABLE TO FIND 2 (two) times daily. Mykind organics, vegan D3, 1000iu  . [DISCONTINUED] AZILECT 1 MG TABS tablet TAKE 1 TABLET (1 MG TOTAL) BY MOUTH DAILY. (Patient not taking: Reported on 09/25/2014)  . [DISCONTINUED] Calcium Carbonate-Vitamin D (CALCIUM + D PO) Take 2 capsules by mouth 2 (two) times daily. Grow Bone Calcium Tablet  . [DISCONTINUED] Calcium Carbonate-Vitamin D (CALCIUM + D PO) Take by mouth 2 (two) times daily. Grow bone  . [DISCONTINUED] escitalopram (LEXAPRO) 5 MG tablet Take 1 tablet (5 mg total) by mouth daily. (Patient not taking: Reported on 09/25/2014)  :  Review of Systems:  Out of a complete 14 point review of systems, all are reviewed and negative with the exception of these symptoms as listed below:   Review of Systems  Neurological: Positive for tremors.       Involuntary movements in right hand, worse  Psychiatric/Behavioral:       Anxiety    Objective:  Neurologic Exam  Physical Exam Physical Examination:   Filed Vitals:   09/25/14 1416  BP: 98/56  Pulse: 72  Temp: 97 F (36.1 C)   General Examination: The patient is a very pleasant 69 y.o. female in no acute distress.   HEENT: Normocephalic, atraumatic, pupils are equal, round and reactive to light and accommodation. Funduscopic exam is normal with sharp disc margins noted. Extraocular tracking shows mild saccadic breakdown without nystagmus noted. There is limitation to upper gaze. There is mild decrease in eye  blink rate. Hearing is intact. Face is symmetric with mild facial masking and normal facial sensation. There is no lip, neck or jaw tremor. Neck is mildly rigid with intact passive ROM. There are no carotid bruits on auscultation. Oropharynx exam reveals mild mouth dryness. No significant airway crowding is noted. Mallampati is class I. Tongue protrudes centrally and palate elevates symmetrically. There is no drooling.   Chest: is clear to auscultation without wheezing, rhonchi or crackles noted.  Heart: sounds are regular and normal without murmurs, rubs or gallops noted.   Abdomen: is soft, non-tender and non-distended with normal bowel sounds appreciated on auscultation.  Extremities: There is no pitting edema in the distal lower extremities bilaterally. Pedal pulses are intact. There  are no varicose veins.  Skin: is warm and dry with no trophic changes noted.    Musculoskeletal: exam reveals no obvious joint deformities, tenderness, joint swelling or erythema.  Neurologically:  Mental status: The patient is awake and alert, paying good  attention. She is able to completely provide the history. She is oriented to: person, place, time/date, situation, day of week, month of year and year. Her memory, attention, language and knowledge are intact. There is no aphasia, agnosia, apraxia or anomia. There is a no significant degree of bradyphrenia. Speech is very mildly hypophonic with no dysarthria noted. Mood is congruent and affect is normal.    Cranial nerves are as described above under HEENT exam. In addition, shoulder shrug is normal with equal shoulder height noted.  Motor exam: Normal bulk, and strength for age is noted. There are no dyskinesias noted.  Tone is mildly rigid with presence of cogwheeling in the right upper extremity. There is overall very mild bradykinesia. There is no drift or rebound.  There is an intermittent, moderate RUE and RLE resting tremor, worse from last time. She  has a very slight intermittent left upper extremity tremor at rest.  Romberg is negative.  Reflexes are 3+ in the upper and lower extremities. Toes are downgoing bilaterally.  Fine motor skills exam: Finger taps are mildly impaired on the right and minimally impaired on the left. Hand movements are mildly impaired on the right and minimally impaired on the left. RAP (rapid alternating patting) is minimally impaired on the right and not impaired on the left. Foot taps are mildly impaired on the right and minimally impaired on the left. Foot agility (in the form of heel stomping) is mildly impaired on the right and minimally impaired on the left.    Cerebellar testing shows no dysmetria or intention tremor on finger to nose testing. Heel to shin is unremarkable bilaterally. There is no truncal or gait ataxia.   Sensory exam is intact to light touch, pinprick, vibration, temperature sense in the upper and lower extremities.   Gait, station and balance: She stands up from the seated position with minimal difficulty and does not need to push up with Her hands. She needs no assistance. No veering to one side is noted. She is not noted to lean to the side. Posture is mildly stooped, stable. Stance is narrow-based. She walks with decrease in stride length and pace and decreased arm swing on the right. She turns in 3 steps. Tandem walk is possible with difficulty. Balance is preserved. She is able to do a toe or heel stance.     Assessment and Plan:   In summary, Dashia Caldeira is a very pleasant 69 year old female with an underlying medical history of vitamin D deficiency, osteopenia, hyperlipidemia, and anxiety, who presents for followup consultation of her right sided predominant Parkinson's disease. Her physical exam is somewhat worse from 6 months ago with more tremor noted and minimal fine motor skill changes on the L. Her anxiety and depression remain untreated and she did not wish to pursue medication for  this. I prescribed Lexapro but she does not wish to try it. She is advised to talk to her primary care physician about her anxiety and potentially pursuing counseling. She is advised about improving her sleep hygiene. She does endorse stressors. She is afraid of trying any other Parkinson's medication. She does not feel Azilect as helpful and wants to come off of it. She is advised that she can taper  off that by reducing it to half a pill daily for a week and stop it. She's also advised that she may notice worsening of her symptoms such as tremors, stiffness, slowness, fine motor dysfunction once she is off of Azilect but it may take 2-3 weeks for her to notice a difference. She can certainly go back on it if she feels that it was helpful after she has been off of it for a while. We talked about other alternatives for symptomatic treatment of Parkinson's disease but she does not wish to try anything new at this time. I will see her back routinely in a few months, sooner if need be. She is encouraged to call in a month for an update or email me through her electronic chart access.  We talked about maintaining a healthy lifestyle in general. I  encouraged her to exercise daily.  I answered all their questions today and the patient was in agreement with the above outlined plan.

## 2014-09-25 NOTE — Patient Instructions (Signed)
You wish to come off of Azilect: take 1/2 pill daily for 1 week, then stop. Your tremor and fatigue and anxiety may get worse.  Talk to Dr. Etter Sjogren about your anxiety and perhaps counseling, since you are afraid of medications.  Please remember to try to maintain good sleep hygiene, which means: Keep a regular sleep and wake schedule, try not to exercise or have a meal within 2 hours of your bedtime, try to keep your bedroom conducive for sleep, that is, cool and dark, without light distractors such as an illuminated alarm clock, and refrain from watching TV right before sleep or in the middle of the night and do not keep the TV or radio on during the night. Also, try not to use or play on electronic devices at bedtime, such as your cell phone, tablet PC or laptop. If you like to read at bedtime on an electronic device, try to dim the background light as much as possible. Do not eat in the middle of the night.

## 2014-10-15 ENCOUNTER — Other Ambulatory Visit: Payer: Self-pay | Admitting: Obstetrics and Gynecology

## 2014-10-15 DIAGNOSIS — Z124 Encounter for screening for malignant neoplasm of cervix: Secondary | ICD-10-CM | POA: Diagnosis not present

## 2014-10-15 DIAGNOSIS — N958 Other specified menopausal and perimenopausal disorders: Secondary | ICD-10-CM | POA: Diagnosis not present

## 2014-10-15 DIAGNOSIS — N812 Incomplete uterovaginal prolapse: Secondary | ICD-10-CM | POA: Diagnosis not present

## 2014-10-15 DIAGNOSIS — Z6820 Body mass index (BMI) 20.0-20.9, adult: Secondary | ICD-10-CM | POA: Diagnosis not present

## 2014-10-15 DIAGNOSIS — M8588 Other specified disorders of bone density and structure, other site: Secondary | ICD-10-CM | POA: Diagnosis not present

## 2014-10-16 LAB — CYTOLOGY - PAP

## 2014-11-18 DIAGNOSIS — N393 Stress incontinence (female) (male): Secondary | ICD-10-CM | POA: Diagnosis not present

## 2014-11-18 DIAGNOSIS — N3641 Hypermobility of urethra: Secondary | ICD-10-CM | POA: Diagnosis not present

## 2014-11-18 DIAGNOSIS — K5900 Constipation, unspecified: Secondary | ICD-10-CM | POA: Diagnosis not present

## 2014-11-18 DIAGNOSIS — N8111 Cystocele, midline: Secondary | ICD-10-CM | POA: Diagnosis not present

## 2014-11-19 ENCOUNTER — Encounter: Payer: Self-pay | Admitting: Internal Medicine

## 2014-11-25 ENCOUNTER — Telehealth: Payer: Self-pay | Admitting: Family Medicine

## 2014-11-25 NOTE — Telephone Encounter (Signed)
Have no idea if we can or not.---- I'll forward to [id number] and [place]

## 2014-11-25 NOTE — Telephone Encounter (Signed)
Caller name: Jackelynn Relation to pt: self Call back number: [phone] Pharmacy:  Reason for call:   Patient states that she received a phone call from our office asking her if she had received her pneumonia inj. Patient states that she does not want to have any inj from [place] since she had a bad reaction to her flu shot last time. Also, patient would like to know if we can take her # off the generic call list??

## 2014-12-02 ENCOUNTER — Telehealth: Payer: Self-pay | Admitting: Neurology

## 2014-12-02 MED ORDER — SERTRALINE HCL 25 MG PO TABS: 25.0000 mg | ORAL_TABLET | Freq: Every day | ORAL | Status: DC

## 2014-12-02 NOTE — Telephone Encounter (Signed)
Patient stated she tried Rx ESCITALOPRAM for 2 days.  Started medication on 3/9 and experienced Nausea and headaches.  Patient stated she took Rx ZOLOFT for 5 to 6 years, questioning if Zoloft would be better.  Please call and infuse.

## 2014-12-02 NOTE — Telephone Encounter (Signed)
Patient reports being nauseous and having headaches after starting escitalopram. She stopped 2 days ago and reports still having a few headaches. States that zoloft worked well for her 5 years ago and wants to know what you think about restarting it? She reports that her anxiety is "really bad right now" and wonders if you think she should see a counselor and if you have anyone to recommend? She requests a call back from you but I let her know you have a full patient load and I will probably be the one to call her back.

## 2014-12-02 NOTE — Telephone Encounter (Signed)
Please advise patient to stay off of the Lexapro. We will start low-dose Zoloft or sertraline which is generic for Zoloft. We will start at 25 mg once daily, preferably at night since most people get a little bit sleepy from this. Most common side effects include sleepiness and some weight gain is reported. Some patients have initial worsening of depressive symptoms, rarely there is suicidal thoughts and some patients especially high risk patients including the very young and the elderly have suicidal ideations when starting a new antidepressant. Reassuringly she tolerated Zoloft well in the past from what I can tell. We will have to monitor her for mood symptoms. If she has sudden changes in her mood she may need to see her primary care physician.

## 2014-12-02 NOTE — Telephone Encounter (Signed)
I called the patient back to relay providers message.  We spoke for nearly 15 minutes.  She will try Zoloft and call us back if she notices any side effects from the medication or if aything further is needed. She also requested to speak with Beverlee Nims again.

## 2014-12-03 NOTE — Telephone Encounter (Signed)
Talked to Jackelyn Poling and she is aware of medication changes. Also recommended to call [facility] since they specialize in concealing people with chronic illnesses.

## 2014-12-03 NOTE — Telephone Encounter (Signed)
Left message to call back  

## 2014-12-23 DIAGNOSIS — N393 Stress incontinence (female) (male): Secondary | ICD-10-CM | POA: Diagnosis not present

## 2014-12-23 DIAGNOSIS — N8111 Cystocele, midline: Secondary | ICD-10-CM | POA: Diagnosis not present

## 2014-12-23 DIAGNOSIS — N3641 Hypermobility of urethra: Secondary | ICD-10-CM | POA: Diagnosis not present

## 2015-01-01 ENCOUNTER — Telehealth: Payer: Self-pay | Admitting: Neurology

## 2015-01-01 DIAGNOSIS — F32A Depression, unspecified: Secondary | ICD-10-CM

## 2015-01-01 DIAGNOSIS — F329 Major depressive disorder, single episode, unspecified: Secondary | ICD-10-CM

## 2015-01-01 MED ORDER — SERTRALINE HCL 50 MG PO TABS: 50.0000 mg | ORAL_TABLET | Freq: Every day | ORAL | Status: DC

## 2015-01-01 NOTE — Telephone Encounter (Signed)
I called the patient back at home.  Got no answer, left message.  Called cell, got no answer.  Left message on this line as well.

## 2015-01-01 NOTE — Telephone Encounter (Signed)
I left message that Dr. Rexene Alberts has increased her medication and has sent it into pharmacy. Left our office number if any further questions.

## 2015-01-01 NOTE — Telephone Encounter (Signed)
Pt is calling stating she wants to increase sertraline (ZOLOFT) 25 MG tablet to 50mg  or more.  Please call and advise.

## 2015-01-01 NOTE — Telephone Encounter (Signed)
She has been on the 25mg  for about a month. What do you suggest?

## 2015-01-01 NOTE — Telephone Encounter (Signed)
Patient paged the on call tonight. Message was that she had some questions regarding increasing a medication. I called back but received an answering machine. Please return her call in the morning. Thank you

## 2015-01-01 NOTE — Telephone Encounter (Signed)
We can go up to 50 mg of generic Zoloft. I placed a prescription and send it electronically to her pharmacy. Please advise patient. It sounds like she has been tolerating the 25 mg. Previously she was not able to tolerate generic Lexapro.

## 2015-01-02 NOTE — Telephone Encounter (Signed)
Left message to call back. I also tried cell number, husband answered but he is away from the house right now. He will tell her that we called back.

## 2015-01-02 NOTE — Telephone Encounter (Signed)
Pls call back re: zoloft

## 2015-01-07 DIAGNOSIS — G20 Parkinson's disease: Secondary | ICD-10-CM | POA: Diagnosis not present

## 2015-01-07 DIAGNOSIS — N393 Stress incontinence (female) (male): Secondary | ICD-10-CM | POA: Diagnosis not present

## 2015-01-07 DIAGNOSIS — N8111 Cystocele, midline: Secondary | ICD-10-CM | POA: Diagnosis not present

## 2015-01-07 DIAGNOSIS — N3641 Hypermobility of urethra: Secondary | ICD-10-CM | POA: Diagnosis not present

## 2015-01-08 NOTE — Telephone Encounter (Signed)
Patient called returning Amanda Parody, RN. Pt's c/b [phone]

## 2015-01-12 NOTE — Telephone Encounter (Signed)
I spoke to patient. She wanted you to know that Dr. Era Bumpers will contact you about an up coming prolapse surgery. [place] also asked if you want her to continue zoloft before and after surgery (no reason to hold a dose)?

## 2015-01-12 NOTE — Telephone Encounter (Signed)
Please call patient: I appreciate the update. Please advise patient that to be sure, she should ask her surgeon and anesthesiologist about holding any meds for surgery.  I wish her good luck for her surgery!

## 2015-01-14 NOTE — Telephone Encounter (Signed)
I spoke to patient and reports understanding. She will call us back if any questions.

## 2015-02-18 ENCOUNTER — Telehealth: Payer: Self-pay | Admitting: Neurology

## 2015-02-18 NOTE — Telephone Encounter (Signed)
I called pt.   She is feeling anxious and does not feel like doing anything (more depressed).   She questions whether it is medication or family issues.   She is asking about reducing dose back to 25mg  or continue with 50mg  daily (which she takes at midnight)?   She also states that she has tremor in her R leg, but also R hand , arm now.  Been off azilect for a year now.  Has appt next Tuesday 02-24-15 with Dr. Rexene Alberts.  Please call and let her know.   May use husbands cell too.  Her daughter Mickel Baas, is a Social worker.  (I told her speaking to someone may help in relating to her worry and anxiety).

## 2015-02-18 NOTE — Telephone Encounter (Signed)
I spoke with Amanda [name] and Dr. Rexene Alberts, then called the patient back. Per Dr. Rexene Alberts, patient should decrease Zoloft to 25mg  until we can address at her appt on Tuesday. Patient voices that she understands. Amanda [name] was also notified that if symptoms became worse to call PCP, go to Urgent care, or ED. Otherwise we will see on Tuesday.

## 2015-02-18 NOTE — Telephone Encounter (Signed)
Patient called stating that the sertraline (ZOLOFT) 50 MG tablet is making her feel bad. Patient is experiencing depressed and that she doesn't feel like doing anything. She is not completely sure if it a side effect from the medication. Please call and advise. Patient can be reached @ [phone]

## 2015-02-24 ENCOUNTER — Ambulatory Visit (INDEPENDENT_AMBULATORY_CARE_PROVIDER_SITE_OTHER): Payer: Medicare Other | Admitting: Neurology

## 2015-02-24 ENCOUNTER — Encounter: Payer: Self-pay | Admitting: Neurology

## 2015-02-24 VITALS — BP 92/58 | HR 62 | Resp 12 | Ht 64.5 in | Wt 116.0 lb

## 2015-02-24 DIAGNOSIS — G20 Parkinson's disease: Secondary | ICD-10-CM

## 2015-02-24 DIAGNOSIS — F419 Anxiety disorder, unspecified: Secondary | ICD-10-CM

## 2015-02-24 DIAGNOSIS — F32A Depression, unspecified: Secondary | ICD-10-CM

## 2015-02-24 DIAGNOSIS — F329 Major depressive disorder, single episode, unspecified: Secondary | ICD-10-CM

## 2015-02-24 NOTE — Progress Notes (Signed)
Subjective:    Patient ID: Amanda [name] is a 69 y.o. female.  HPI     Interim history:   Amanda [name] is a very pleasant 69 year old right-handed woman with an underlying medical history of vitamin D deficiency, osteopenia, hyperlipidemia, and anxiety, who presents for followup consultation of her right sided predominant Parkinson's disease. She is accompanied by her husband again today. I last saw her on 09/25/2014, at which time she reported that her tremor was worse. She had not tried the Lexapro for fear of side effects. Her husband reported that her short-term memory was worse. She was more fatigued. She was having a lot of stress. She was not sleeping very well. She was not sure of Azilect was helpful. We mutually agreed to taper off of it.  In the interim, she tried Lexapro but felt she had side effects after a couple of days. She called back in March and she reported that she had tried Zoloft several years ago and wanted to try it again. I prescribed Zoloft 25 mg for her. She called back in April and wanted to increase the Zoloft. We increase this to 50 mg. In the interim, she was supposed to have bladder surgery, but did not have it yet. She called back in June, stating that she felt worse with Zoloft 50 mg. I suggested she decrease it back to 25 mg.   Today, 02/24/2015: She reports feeling worse with her tremor on the R. She has reduced her Zoloft to 25 mg. she had more depressive symptoms on the 50 mg. She is off of Azilect. Her tremor is worse according to her report and her husband's observations but she does not think it's because she is off of Azilect. She has made an appointment with Dr. Linus Mako at [facility] which is pending for 03/25/2015. Dr. Erling Cruz had tried her on Azilect and she was on this for a few years. She has a friend who has Parkinson's disease and takes a number of other medications including levodopa. Again, she has never tried a dopamine agonist, or levodopa in any  form. She says her driving is still good. She still has a lot of stresses in particular with her son's divorce and she helps take care of her grandson. Her husband aunts that her anxiety seems worse. She says that their daughter commented that the addition of Zoloft low-dose had helped. She had seen a psychiatrist years ago.  Previously:   I saw her on 03/25/2014, at which time she reported feeling stable with regards to her motor symptoms. She had a recent UTI was treated with ciprofloxacin. Her husband reported symptoms of depression and anxiety and I suggested she could start a low dose Lexapro and provided her with a prescription for this. She was continued on Azilect. She was given a prescription for diclofenac topical for left upper arm pain from Dr. Tamala Julian.  I saw her on 09/23/2013, at which time I felt she was stable and continued her on Azilect once daily. In the interim she was seen by Dr. Tamala Julian in sports medicine because of left arm pain since October 2014 after a flu shot; she has since then improved in that regard, using a compression bandage, which he provided.  I first met her on 03/27/2013 at which time I suggested no new medication with the exception of consideration of adding coenzyme Q 10 over-the-counter. She has been on Azilect.    She previously followed with Dr. Morene Antu and was last seen  by him on 11/21/2012, at which time Dr. Erling Cruz felt that she was doing very well. She has had some increase in tremors over time. He did not suggest any medication changes at the time.    She first noticed a right hand tremor in April 2010 followed by a right leg tremor in June 2010. MRI brain with and without contrast showed nonspecific white matter hyperintensities, 24-hour urine for heavy metals was negative. CBC, CMP, TSH, RPR, ESR and serum ceruloplasmin levels were normal. She started rasagiline in November 2010 and tolerated it well. There has been some concern about her memory. She denies  any hallucinations, depression or orthostatic hypotension. She does not exercise regularly.    Her Past Medical History Is Significant For: Past Medical History  Diagnosis Date  . Anxiety   . Osteopenia   . Runner's knee   . Parkinson disease     Her Past Surgical History Is Significant For: Past Surgical History  Procedure Laterality Date  . Ovarian cyst removal  11-2006  . Laparoscopy      Her Family History Is Significant For: Family History  Problem Relation Age of Onset  . Heart disease Mother     enlarged heart  . Cancer Father     pancreatic  . COPD Sister   . Pulmonary embolism Sister   . Heart disease Brother     Her Social History Is Significant For: History   Social History  . Marital Status: Married    Spouse Name: Amanda [name]  . Number of Children: 3  . Years of Education: College   Occupational History  . retired    Social History Main Topics  . Smoking status: Never Smoker   . Smokeless tobacco: Never Used  . Alcohol Use: Yes     Comment: 1 glass of wine occasionally  . Drug Use: No  . Sexual Activity:    Partners: Male   Other Topics Concern  . None   Social History Narrative   Pt lives at home with family.   Caffeine Use: some, occasionally    Her Allergies Are:  Allergies  Allergen Reactions  . Penicillins   :   Her Current Medications Are:  Outpatient Encounter Prescriptions as of 02/24/2015  Medication Sig  . b complex vitamins tablet Take 1 tablet by mouth daily.    . NON FORMULARY Progesterone and Bi-est Cream as directed   . Nutritional Supplements (DHEA PO) Take 5 mg by mouth daily.  . Omega-3 Fatty Acids (FISH OIL) 1200 MG CAPS Take by mouth daily.    . sertraline (ZOLOFT) 50 MG tablet Take 1 tablet (50 mg total) by mouth at bedtime. (Patient taking differently: Take 25 mg by mouth at bedtime. )  . UNABLE TO FIND 2 (two) times daily. Mykind organics, vegan D3, 1000iu  . [DISCONTINUED] rasagiline (AZILECT) 1 MG TABS tablet  Take 1 tablet (1 mg total) by mouth daily.  . [DISCONTINUED] ciclopirox (LOPROX) 0.77 % cream   . [DISCONTINUED] Diclofenac Sodium (PENNSAID) 2 % SOLN Place 2 application onto the skin 2 (two) times daily.  . [DISCONTINUED] TURMERIC PO Take 2 capsules by mouth daily.   No facility-administered encounter medications on file as of 02/24/2015.  :  Review of Systems:  Out of a complete 14 point review of systems, all are reviewed and negative with the exception of these symptoms as listed below:   Review of Systems  Neurological: Positive for tremors.       Patient feels  like her tremors are worse   Psychiatric/Behavioral:       Anxiety     Objective:  Neurologic Exam  Physical Exam Physical Examination:   Filed Vitals:   02/24/15 1542  BP: 92/58  Pulse: 62  Resp: 12   General Examination: The patient is a very pleasant 69 y.o. female in no acute distress. She is anxious appearing. Speech is slightly pressured.  HEENT: Normocephalic, atraumatic, pupils are equal, round and reactive to light and accommodation. Funduscopic exam is normal with sharp disc margins noted. Extraocular tracking shows mild saccadic breakdown without nystagmus noted. There is limitation to upper gaze. There is mild decrease in eye blink rate. Hearing is intact. Face is symmetric with mild facial masking and normal facial sensation. There is no lip, neck or jaw tremor. Neck is mildly rigid with intact passive ROM. There are no carotid bruits on auscultation. Oropharynx exam reveals mild mouth dryness. No significant airway crowding is noted. Mallampati is class I. Tongue protrudes centrally and palate elevates symmetrically. There is no drooling.   Chest: is clear to auscultation without wheezing, rhonchi or crackles noted.  Heart: sounds are regular and normal without murmurs, rubs or gallops noted.   Abdomen: is soft, non-tender and non-distended with normal bowel sounds appreciated on  auscultation.  Extremities: There is no pitting edema in the distal lower extremities bilaterally. Pedal pulses are intact. There are no varicose veins.  Skin: is warm and dry with no trophic changes noted.    Musculoskeletal: exam reveals no obvious joint deformities, tenderness, joint swelling or erythema.  Neurologically:  Mental status: The patient is awake and alert, paying good attention. She is able to completely provide the history. Her husband provides some details. She is oriented to: person, place, time/date, situation, day of week, month of year and year. Her memory, attention, language and knowledge are intact. There is no aphasia, agnosia, apraxia or anomia. There is a no significant degree of bradyphrenia. Speech is very mildly hypophonic with no dysarthria noted. Mood is congruent and affect is normal.    Cranial nerves are as described above under HEENT exam. In addition, shoulder shrug is normal with equal shoulder height noted.  Motor exam: Normal bulk, and strength for age is noted. There are no dyskinesias noted.  Tone is mildly rigid with presence of cogwheeling in the right upper extremity. There is mild bradykinesia. There is no drift or rebound.  There is a moderate RUE and RLE resting tremor, worse from last time. She has a mild intermittent left upper extremity tremor at rest, also worse.  Romberg is negative.  Reflexes are 3+ in the upper and lower extremities.  Fine motor skills exam: Finger taps are mildly impaired on the right and minimally impaired on the left. Hand movements are mildly impaired on the right and minimally impaired on the left. RAP (rapid alternating patting) is minimally impaired on the right and not impaired on the left. Foot taps are mildly impaired on the right and minimally impaired on the left. Foot agility (in the form of heel stomping) is mildly impaired on the right and minimally impaired on the left.    Cerebellar testing shows no  dysmetria or intention tremor on finger to nose testing. Heel to shin is unremarkable bilaterally. There is no truncal or gait ataxia.   Sensory exam is intact to light touch in the upper and lower extremities.   Gait, station and balance: She stands up from the seated position with minimal  difficulty and does not need to push up with her hands. She needs no assistance. No veering to one side is noted. She is not noted to lean to the side. Posture is mildly stooped, stable. Stance is narrow-based. She walks with mild decrease in stride length and pace and decreased arm swing on the right. She turns in 3 steps. Tandem walk is possible with difficulty. Balance is preserved.    Assessment and Plan:   In summary, Shanera Meske is a very pleasant 69 year old female with an underlying medical history of vitamin D deficiency, osteopenia, hyperlipidemia, and anxiety, who presents for followup consultation of her right sided predominant Parkinson's disease. Her physical exam has been worse since January 2016. She has been off of Azilect. I do believe that she may be somewhat worse because she is off of Azilect. She's not convinced. She is currently on low-dose sertraline. She did not tolerate Lexapro or 50 mg of sertraline recently. Today, I suggested that she stay on the same medication until she sees Dr. Linus Mako at [facility]. I think it would be a more forgetful visit for her if she does not make any medication changes in the near future before seeing him. We can see her back in follow-up here in about 3 months for recheck. I would be happy to follow any recommendations he might have. In addition, I suggested a referral to a psychiatrist at this time to help with management of anxiety and depression especially in light of difficulty tolerating 2 medications recently. She is open to this. It has been somewhat challenging to make medication suggestions to her in the recent past because she has been reluctant to  try other Parkinson's medications for fear of side effects. She's currently on no anti-parkinsonian medications. She postponed her bladder surgery. We talked about other alternatives for symptomatic treatment of Parkinson's disease but she had expressed at her last visit that she did not wish to try anything new. At this time, we will keep her current dose of sertraline and I will make referral to psychiatry and we will await her visit with Dr. Linus Mako. I suggested a follow-up with me in about 3 months, sooner if the need arises.  I answered all their questions today and the patient and her husband were in agreement with the above outlined plan.  I spent 25 minutes in total face-to-face time with the patient, more than 50% of which was spent in counseling and coordination of care, reviewing test results, reviewing medication and discussing or reviewing the diagnosis of PD, and mood disorder, the prognosis and treatment options.

## 2015-02-24 NOTE — Patient Instructions (Addendum)
We will not make any medication changes at this time as your are scheduled to see Dr. Linus Mako in a month from now. Let's wait out that appointment.   I would like to refer you to Dr. Casimiro Needle in psychiatry. In the meantime, we will continue with sertraline at 25 mg daily.   I will see you back in about 3 months.

## 2015-02-25 DIAGNOSIS — B351 Tinea unguium: Secondary | ICD-10-CM | POA: Diagnosis not present

## 2015-02-25 DIAGNOSIS — L821 Other seborrheic keratosis: Secondary | ICD-10-CM | POA: Diagnosis not present

## 2015-02-25 DIAGNOSIS — L814 Other melanin hyperpigmentation: Secondary | ICD-10-CM | POA: Diagnosis not present

## 2015-02-25 DIAGNOSIS — B353 Tinea pedis: Secondary | ICD-10-CM | POA: Diagnosis not present

## 2015-02-25 DIAGNOSIS — L738 Other specified follicular disorders: Secondary | ICD-10-CM | POA: Diagnosis not present

## 2015-02-25 DIAGNOSIS — D1801 Hemangioma of skin and subcutaneous tissue: Secondary | ICD-10-CM | POA: Diagnosis not present

## 2015-03-04 ENCOUNTER — Encounter: Payer: Self-pay | Admitting: Behavioral Health

## 2015-03-04 ENCOUNTER — Telehealth: Payer: Self-pay | Admitting: Behavioral Health

## 2015-03-04 NOTE — Telephone Encounter (Signed)
Pre-Visit Call completed with patient and chart updated.   Pre-Visit Info documented in Specialty Comments under [employer].    

## 2015-03-05 ENCOUNTER — Ambulatory Visit (INDEPENDENT_AMBULATORY_CARE_PROVIDER_SITE_OTHER): Payer: Medicare Other | Admitting: Family Medicine

## 2015-03-05 ENCOUNTER — Encounter: Payer: Self-pay | Admitting: Family Medicine

## 2015-03-05 VITALS — BP 100/68 | HR 66 | Temp 98.5°F | Ht 65.0 in | Wt 117.0 lb

## 2015-03-05 DIAGNOSIS — M8580 Other specified disorders of bone density and structure, unspecified site: Secondary | ICD-10-CM

## 2015-03-05 DIAGNOSIS — Z23 Encounter for immunization: Secondary | ICD-10-CM | POA: Diagnosis not present

## 2015-03-05 DIAGNOSIS — Z0000 Encounter for general adult medical examination without abnormal findings: Secondary | ICD-10-CM | POA: Diagnosis not present

## 2015-03-05 DIAGNOSIS — E559 Vitamin D deficiency, unspecified: Secondary | ICD-10-CM | POA: Diagnosis not present

## 2015-03-05 DIAGNOSIS — G20 Parkinson's disease: Secondary | ICD-10-CM | POA: Diagnosis not present

## 2015-03-05 DIAGNOSIS — F411 Generalized anxiety disorder: Secondary | ICD-10-CM

## 2015-03-05 DIAGNOSIS — Z136 Encounter for screening for cardiovascular disorders: Secondary | ICD-10-CM | POA: Diagnosis not present

## 2015-03-05 DIAGNOSIS — Z79899 Other long term (current) drug therapy: Secondary | ICD-10-CM | POA: Diagnosis not present

## 2015-03-05 LAB — CBC WITH DIFFERENTIAL/PLATELET
BASOS PCT: 0.5 % (ref 0.0–3.0)
Basophils Absolute: 0 10*3/uL (ref 0.0–0.1)
EOS ABS: 0.1 10*3/uL (ref 0.0–0.7)
Eosinophils Relative: 1.5 % (ref 0.0–5.0)
HEMATOCRIT: 40.3 % (ref 36.0–46.0)
Hemoglobin: 13.3 g/dL (ref 12.0–15.0)
LYMPHS ABS: 1.5 10*3/uL (ref 0.7–4.0)
Lymphocytes Relative: 24.9 % (ref 12.0–46.0)
MCHC: 33 g/dL (ref 30.0–36.0)
MCV: 92.2 fl (ref 78.0–100.0)
Monocytes Absolute: 0.4 10*3/uL (ref 0.1–1.0)
Monocytes Relative: 7 % (ref 3.0–12.0)
NEUTROS ABS: 4 10*3/uL (ref 1.4–7.7)
NEUTROS PCT: 66.1 % (ref 43.0–77.0)
PLATELETS: 198 10*3/uL (ref 150.0–400.0)
RBC: 4.38 Mil/uL (ref 3.87–5.11)
RDW: 14.5 % (ref 11.5–15.5)
WBC: 6.1 10*3/uL (ref 4.0–10.5)

## 2015-03-05 LAB — BASIC METABOLIC PANEL
BUN: 24 mg/dL — ABNORMAL HIGH (ref 6–23)
CO2: 29 meq/L (ref 19–32)
Calcium: 9.6 mg/dL (ref 8.4–10.5)
Chloride: 103 mEq/L (ref 96–112)
Creatinine, Ser: 0.97 mg/dL (ref 0.40–1.20)
GFR: 60.74 mL/min (ref >60.00)
GLUCOSE: 91 mg/dL (ref 70–99)
POTASSIUM: 3.8 meq/L (ref 3.5–5.1)
Sodium: 137 mEq/L (ref 135–145)

## 2015-03-05 LAB — HEPATIC FUNCTION PANEL
ALT: 17 U/L (ref 0–35)
AST: 20 U/L (ref 0–37)
Albumin: 4.2 g/dL (ref 3.5–5.2)
Alkaline Phosphatase: 57 U/L (ref 39–117)
BILIRUBIN DIRECT: 0.1 mg/dL (ref 0.0–0.3)
Total Bilirubin: 0.7 mg/dL (ref 0.2–1.2)
Total Protein: 6.7 g/dL (ref 6.0–8.3)

## 2015-03-05 LAB — LIPID PANEL
CHOLESTEROL: 164 mg/dL (ref 0–200)
HDL: 72.7 mg/dL (ref >39.00)
LDL Cholesterol: 76 mg/dL (ref 0–99)
NONHDL: 91.3
Total CHOL/HDL Ratio: 2
Triglycerides: 78 mg/dL (ref 0.0–149.0)
VLDL: 15.6 mg/dL (ref 0.0–40.0)

## 2015-03-05 NOTE — Progress Notes (Signed)
Pre visit review using our clinic review tool, if applicable. No additional management support is needed unless otherwise documented below in the visit note. 

## 2015-03-05 NOTE — Progress Notes (Signed)
Subjective:   Amanda [name] is a 69 y.o. female who presents for Medicare Annual (Subsequent) preventive examination. Pt seeing neuro for parkinsons and tremor.   Pt discussed what was going on with neuro and meds and worsening of her tremor and depression.  Depression seems under control right now with zoloft and counseling.    Review of Systems:   Review of Systems  Constitutional: Negative for activity change, appetite change and fatigue.  HENT: Negative for hearing loss, congestion, tinnitus and ear discharge.   Eyes: Negative for visual disturbance (see optho q1y -- vision corrected to 20/20 with glasses).  Respiratory: Negative for cough, chest tightness and shortness of breath.   Cardiovascular: Negative for chest pain, palpitations and leg swelling.  Gastrointestinal: Negative for abdominal pain, diarrhea, constipation and abdominal distention.  Genitourinary: Negative for urgency, frequency, decreased urine volume and difficulty urinating.  Musculoskeletal: Negative for back pain, arthralgias and gait problem.  Skin: Negative for color change, pallor and rash.  Neurological: Negative for dizziness, light-headedness, numbness and headaches. --- + tremor due to parkinsons Hematological: Negative for adenopathy. Does not bruise/bleed easily.  Psychiatric/Behavioral: Negative for suicidal ideas, confusion, sleep disturbance, self-injury, dysphoric mood, decreased concentration and agitation.  Pt is able to read and write and can do all ADLs No risk for falling No abuse/ violence in home           Objective:     Vitals: Ht 5\' 5"  (1.651 m)  Wt 117 lb (53.071 kg)  BMI 19.47 kg/m2 Ht 5\' 5"  (1.651 m)  Wt 117 lb (53.071 kg)  BMI 19.47 kg/m2 General appearance: alert, cooperative, appears stated age and no distress Head: Normocephalic, without obvious abnormality, atraumatic Eyes: negative findings: lids and lashes normal and pupils equal, round, reactive to light and  accomodation Ears: normal TM's and external ear canals both ears Nose: Nares normal. Septum midline. Mucosa normal. No drainage or sinus tenderness. Throat: lips, mucosa, and tongue normal; teeth and gums normal Neck: no adenopathy, no carotid bruit, no JVD, supple, symmetrical, trachea midline and thyroid not enlarged, symmetric, no tenderness/mass/nodules Back: symmetric, no curvature. ROM normal. No CVA tenderness. Lungs: clear to auscultation bilaterally Breasts: gyn Heart: S1, S2 normal Abdomen: soft, non-tender; bowel sounds normal; no masses,  no organomegaly Pelvic: deferred--gyn Extremities: extremities normal, atraumatic, no cyanosis or edema Pulses: 2+ and symmetric Skin: Skin color, texture, turgor normal. No rashes or lesions Lymph nodes: Cervical, supraclavicular, and axillary nodes normal. Neurologic: Alert and oriented X 3, normal strength and tone. Normal symmetric reflexes. Normal coordination and gait--+ tremor Psych-- no depression, no anxiety-- doing well with zoloft Tobacco History  Smoking status  . Never Smoker   Smokeless tobacco  . Never Used     Counseling given: Not Answered   Past Medical History  Diagnosis Date  . Anxiety   . Osteopenia   . Runner's knee   . Parkinson disease    Past Surgical History  Procedure Laterality Date  . Ovarian cyst removal  11-2006  . Laparoscopy     Family History  Problem Relation Age of Onset  . Heart disease Mother     enlarged heart  . Cancer Father     pancreatic  . COPD Sister   . Pulmonary embolism Sister   . Heart disease Brother    History  Sexual Activity  . Sexual Activity:  . Partners: Male    Outpatient Encounter Prescriptions as of 03/05/2015  Medication Sig  . b complex vitamins  tablet Take 1 tablet by mouth daily.    . NON FORMULARY Progesterone and Bi-est Cream as directed   . Nutritional Supplements (DHEA PO) Take 5 mg by mouth daily.  . Omega-3 Fatty Acids (FISH OIL) 1200 MG CAPS  Take by mouth daily.    . sertraline (ZOLOFT) 50 MG tablet Take 1 tablet (50 mg total) by mouth at bedtime. (Patient taking differently: Take 25 mg by mouth at bedtime. )  . UNABLE TO FIND 2 (two) times daily. Mykind organics, vegan D3, 1000iu   No facility-administered encounter medications on file as of 03/05/2015.    Activities of Daily Living In your present state of health, do you have any difficulty performing the following activities: 03/05/2015  Hearing? N  Vision? N  Difficulty concentrating or making decisions? N  Walking or climbing stairs? N  Dressing or bathing? N  Doing errands, shopping? N    Patient Care Team: Rosalita Chessman, DO as PCP - General Star Age, MD as Attending Physician (Neurology) Dian Queen, MD as Consulting Physician (Obstetrics and Gynecology) Katy Apo, MD as Consulting Physician (Ophthalmology) Lyndal Pulley, DO as Attending Physician (Family Medicine) Eldridge Abrahams, MD as Referring Physician (Neurology) Carolan Clines, MD as Consulting Physician (Urology)    Assessment:    CPE Exercise Activities and Dietary recommendations: bike, stretches    Goals    None     Fall Risk Fall Risk  03/05/2015 03/05/2015 01/09/2014 10/03/2012  Falls in the past year? No No No No   Depression Screen PHQ 2/9 Scores 03/05/2015 03/05/2015 01/09/2014 10/03/2012  PHQ - 2 Score 4 1 0 0  PHQ- 9 Score 7 - - -     Cognitive Testing AAOx3 nad  Immunization History  Administered Date(s) Administered  . Influenza Split 08/17/2011  . Influenza Whole 10/21/2009  . Influenza,inj,Quad PF,36+ Mos 07/12/2013  . Td 04/22/2004, 10/21/2009  . Zoster 06/26/2008   Screening Tests Health Maintenance  Topic Date Due  . PNA vac Low Risk Adult (1 of 2 - PCV13) 05/22/2012  . COLONOSCOPY  06/23/2014  . INFLUENZA VACCINE  04/20/2015  . MAMMOGRAM  10/16/2015  . TETANUS/TDAP  10/22/2019  . DEXA SCAN  Completed  . ZOSTAVAX  Completed      Plan:    see  AVS Check labs During the course of the visit the patient was educated and counseled about the following appropriate screening and preventive services:   Vaccines to include Pneumoccal, Influenza, Hepatitis B, Td, Zostavax, HCV  Electrocardiogram  Cardiovascular Disease  Colorectal cancer screening  Bone density screening  Diabetes screening  Glaucoma screening  Mammography/PAP  Nutrition counseling  -- +living will not on file Patient Instructions (the written plan) was given to the patient.   1. Parkinson disease Per neuro - Basic metabolic panel - CBC with Differential/Platelet - Hepatic function panel - Lipid panel - POCT urinalysis dipstick  2. Generalized anxiety disorder con't zoloft - Basic metabolic panel - CBC with Differential/Platelet - Hepatic function panel - Lipid panel - POCT urinalysis dipstick  3. Osteopenia Per gyn - Basic metabolic panel - CBC with Differential/Platelet - Hepatic function panel - Lipid panel - POCT urinalysis dipstick - Vitamin D 1,25 dihydroxy  4. Screening, ischemic heart disease  - Lipid panel  5. Preventative health care See avs  Garnet Koyanagi, DO  03/05/2015

## 2015-03-05 NOTE — Patient Instructions (Signed)
Preventive Care for Adults A healthy lifestyle and preventive care can promote health and wellness. Preventive health guidelines for women include the following key practices.  A routine yearly physical is a good way to check with your health care provider about your health and preventive screening. It is a chance to share any concerns and updates on your health and to receive a thorough exam.  Visit your dentist for a routine exam and preventive care every 6 months. Brush your teeth twice a day and floss once a day. Good oral hygiene prevents tooth decay and gum disease.  The frequency of eye exams is based on your age, health, family medical history, use of contact lenses, and other factors. Follow your health care provider's recommendations for frequency of eye exams.  Eat a healthy diet. Foods like vegetables, fruits, whole grains, low-fat dairy products, and lean protein foods contain the nutrients you need without too many calories. Decrease your intake of foods high in solid fats, added sugars, and salt. Eat the right amount of calories for you.Get information about a proper diet from your health care provider, if necessary.  Regular physical exercise is one of the most important things you can do for your health. Most adults should get at least 150 minutes of moderate-intensity exercise (any activity that increases your heart rate and causes you to sweat) each week. In addition, most adults need muscle-strengthening exercises on 2 or more days a week.  Maintain a healthy weight. The body mass index (BMI) is a screening tool to identify possible weight problems. It provides an estimate of body fat based on height and weight. Your health care provider can find your BMI and can help you achieve or maintain a healthy weight.For adults 20 years and older:  A BMI below 18.5 is considered underweight.  A BMI of 18.5 to 24.9 is normal.  A BMI of 25 to 29.9 is considered overweight.  A BMI of  30 and above is considered obese.  Maintain normal blood lipids and cholesterol levels by exercising and minimizing your intake of saturated fat. Eat a balanced diet with plenty of fruit and vegetables. Blood tests for lipids and cholesterol should begin at age 76 and be repeated every 5 years. If your lipid or cholesterol levels are high, you are over 50, or you are at high risk for heart disease, you may need your cholesterol levels checked more frequently.Ongoing high lipid and cholesterol levels should be treated with medicines if diet and exercise are not working.  If you smoke, find out from your health care provider how to quit. If you do not use tobacco, do not start.  Lung cancer screening is recommended for adults aged 22-80 years who are at high risk for developing lung cancer because of a history of smoking. A yearly low-dose CT scan of the lungs is recommended for people who have at least a 30-pack-year history of smoking and are a current smoker or have quit within the past 15 years. A pack year of smoking is smoking an average of 1 pack of cigarettes a day for 1 year (for example: 1 pack a day for 30 years or 2 packs a day for 15 years). Yearly screening should continue until the smoker has stopped smoking for at least 15 years. Yearly screening should be stopped for people who develop a health problem that would prevent them from having lung cancer treatment.  If you are pregnant, do not drink alcohol. If you are breastfeeding,  be very cautious about drinking alcohol. If you are not pregnant and choose to drink alcohol, do not have more than 1 drink per day. One drink is considered to be 12 ounces (355 mL) of beer, 5 ounces (148 mL) of wine, or 1.5 ounces (44 mL) of liquor.  Avoid use of street drugs. Do not share needles with anyone. Ask for help if you need support or instructions about stopping the use of drugs.  High blood pressure causes heart disease and increases the risk of  stroke. Your blood pressure should be checked at least every 1 to 2 years. Ongoing high blood pressure should be treated with medicines if weight loss and exercise do not work.  If you are 75-52 years old, ask your health care provider if you should take aspirin to prevent strokes.  Diabetes screening involves taking a blood sample to check your fasting blood sugar level. This should be done once every 3 years, after age 15, if you are within normal weight and without risk factors for diabetes. Testing should be considered at a younger age or be carried out more frequently if you are overweight and have at least 1 risk factor for diabetes.  Breast cancer screening is essential preventive care for women. You should practice "breast self-awareness." This means understanding the normal appearance and feel of your breasts and may include breast self-examination. Any changes detected, no matter how small, should be reported to a health care provider. Women in their 58s and 30s should have a clinical breast exam (CBE) by a health care provider as part of a regular health exam every 1 to 3 years. After age 16, women should have a CBE every year. Starting at age 53, women should consider having a mammogram (breast X-ray test) every year. Women who have a family history of breast cancer should talk to their health care provider about genetic screening. Women at a high risk of breast cancer should talk to their health care providers about having an MRI and a mammogram every year.  Breast cancer gene (BRCA)-related cancer risk assessment is recommended for women who have family members with BRCA-related cancers. BRCA-related cancers include breast, ovarian, tubal, and peritoneal cancers. Having family members with these cancers may be associated with an increased risk for harmful changes (mutations) in the breast cancer genes BRCA1 and BRCA2. Results of the assessment will determine the need for genetic counseling and  BRCA1 and BRCA2 testing.  Routine pelvic exams to screen for cancer are no longer recommended for nonpregnant women who are considered low risk for cancer of the pelvic organs (ovaries, uterus, and vagina) and who do not have symptoms. Ask your health care provider if a screening pelvic exam is right for you.  If you have had past treatment for cervical cancer or a condition that could lead to cancer, you need Pap tests and screening for cancer for at least 20 years after your treatment. If Pap tests have been discontinued, your risk factors (such as having a new sexual partner) need to be reassessed to determine if screening should be resumed. Some women have medical problems that increase the chance of getting cervical cancer. In these cases, your health care provider may recommend more frequent screening and Pap tests.  The HPV test is an additional test that may be used for cervical cancer screening. The HPV test looks for the virus that can cause the cell changes on the cervix. The cells collected during the Pap test can be  tested for HPV. The HPV test could be used to screen women aged 30 years and older, and should be used in women of any age who have unclear Pap test results. After the age of 30, women should have HPV testing at the same frequency as a Pap test.  Colorectal cancer can be detected and often prevented. Most routine colorectal cancer screening begins at the age of 50 years and continues through age 75 years. However, your health care provider may recommend screening at an earlier age if you have risk factors for colon cancer. On a yearly basis, your health care provider may provide home test kits to check for hidden blood in the stool. Use of a small camera at the end of a tube, to directly examine the colon (sigmoidoscopy or colonoscopy), can detect the earliest forms of colorectal cancer. Talk to your health care provider about this at age 50, when routine screening begins. Direct  exam of the colon should be repeated every 5-10 years through age 75 years, unless early forms of pre-cancerous polyps or small growths are found.  People who are at an increased risk for hepatitis B should be screened for this virus. You are considered at high risk for hepatitis B if:  You were born in a country where hepatitis B occurs often. Talk with your health care provider about which countries are considered high risk.  Your parents were born in a high-risk country and you have not received a shot to protect against hepatitis B (hepatitis B vaccine).  You have HIV or AIDS.  You use needles to inject street drugs.  You live with, or have sex with, someone who has hepatitis B.  You get hemodialysis treatment.  You take certain medicines for conditions like cancer, organ transplantation, and autoimmune conditions.  Hepatitis C blood testing is recommended for all people born from 1945 through 1965 and any individual with known risks for hepatitis C.  Practice safe sex. Use condoms and avoid high-risk sexual practices to reduce the spread of sexually transmitted infections (STIs). STIs include gonorrhea, chlamydia, syphilis, trichomonas, herpes, HPV, and human immunodeficiency virus (HIV). Herpes, HIV, and HPV are viral illnesses that have no cure. They can result in disability, cancer, and death.  You should be screened for sexually transmitted illnesses (STIs) including gonorrhea and chlamydia if:  You are sexually active and are younger than 24 years.  You are older than 24 years and your health care provider tells you that you are at risk for this type of infection.  Your sexual activity has changed since you were last screened and you are at an increased risk for chlamydia or gonorrhea. Ask your health care provider if you are at risk.  If you are at risk of being infected with HIV, it is recommended that you take a prescription medicine daily to prevent HIV infection. This is  called preexposure prophylaxis (PrEP). You are considered at risk if:  You are a heterosexual woman, are sexually active, and are at increased risk for HIV infection.  You take drugs by injection.  You are sexually active with a partner who has HIV.  Talk with your health care provider about whether you are at high risk of being infected with HIV. If you choose to begin PrEP, you should first be tested for HIV. You should then be tested every 3 months for as long as you are taking PrEP.  Osteoporosis is a disease in which the bones lose minerals and strength   with aging. This can result in serious bone fractures or breaks. The risk of osteoporosis can be identified using a bone density scan. Women ages 65 years and over and women at risk for fractures or osteoporosis should discuss screening with their health care providers. Ask your health care provider whether you should take a calcium supplement or vitamin D to reduce the rate of osteoporosis.  Menopause can be associated with physical symptoms and risks. Hormone replacement therapy is available to decrease symptoms and risks. You should talk to your health care provider about whether hormone replacement therapy is right for you.  Use sunscreen. Apply sunscreen liberally and repeatedly throughout the day. You should seek shade when your shadow is shorter than you. Protect yourself by wearing long sleeves, pants, a wide-brimmed hat, and sunglasses year round, whenever you are outdoors.  Once a month, do a whole body skin exam, using a mirror to look at the skin on your back. Tell your health care provider of new moles, moles that have irregular borders, moles that are larger than a pencil eraser, or moles that have changed in shape or color.  Stay current with required vaccines (immunizations).  Influenza vaccine. All adults should be immunized every year.  Tetanus, diphtheria, and acellular pertussis (Td, Tdap) vaccine. Pregnant women should  receive 1 dose of Tdap vaccine during each pregnancy. The dose should be obtained regardless of the length of time since the last dose. Immunization is preferred during the 27th-36th week of gestation. An adult who has not previously received Tdap or who does not know her vaccine status should receive 1 dose of Tdap. This initial dose should be followed by tetanus and diphtheria toxoids (Td) booster doses every 10 years. Adults with an unknown or incomplete history of completing a 3-dose immunization series with Td-containing vaccines should begin or complete a primary immunization series including a Tdap dose. Adults should receive a Td booster every 10 years.  Varicella vaccine. An adult without evidence of immunity to varicella should receive 2 doses or a second dose if she has previously received 1 dose. Pregnant females who do not have evidence of immunity should receive the first dose after pregnancy. This first dose should be obtained before leaving the health care facility. The second dose should be obtained 4-8 weeks after the first dose.  Human papillomavirus (HPV) vaccine. Females aged 13-26 years who have not received the vaccine previously should obtain the 3-dose series. The vaccine is not recommended for use in pregnant females. However, pregnancy testing is not needed before receiving a dose. If a female is found to be pregnant after receiving a dose, no treatment is needed. In that case, the remaining doses should be delayed until after the pregnancy. Immunization is recommended for any person with an immunocompromised condition through the age of 26 years if she did not get any or all doses earlier. During the 3-dose series, the second dose should be obtained 4-8 weeks after the first dose. The third dose should be obtained 24 weeks after the first dose and 16 weeks after the second dose.  Zoster vaccine. One dose is recommended for adults aged 60 years or older unless certain conditions are  present.  Measles, mumps, and rubella (MMR) vaccine. Adults born before 1957 generally are considered immune to measles and mumps. Adults born in 1957 or later should have 1 or more doses of MMR vaccine unless there is a contraindication to the vaccine or there is laboratory evidence of immunity to   each of the three diseases. A routine second dose of MMR vaccine should be obtained at least 28 days after the first dose for students attending postsecondary schools, health care workers, or international travelers. People who received inactivated measles vaccine or an unknown type of measles vaccine during 1963-1967 should receive 2 doses of MMR vaccine. People who received inactivated mumps vaccine or an unknown type of mumps vaccine before 1979 and are at high risk for mumps infection should consider immunization with 2 doses of MMR vaccine. For females of childbearing age, rubella immunity should be determined. If there is no evidence of immunity, females who are not pregnant should be vaccinated. If there is no evidence of immunity, females who are pregnant should delay immunization until after pregnancy. Unvaccinated health care workers born before 1957 who lack laboratory evidence of measles, mumps, or rubella immunity or laboratory confirmation of disease should consider measles and mumps immunization with 2 doses of MMR vaccine or rubella immunization with 1 dose of MMR vaccine.  Pneumococcal 13-valent conjugate (PCV13) vaccine. When indicated, a person who is uncertain of her immunization history and has no record of immunization should receive the PCV13 vaccine. An adult aged 19 years or older who has certain medical conditions and has not been previously immunized should receive 1 dose of PCV13 vaccine. This PCV13 should be followed with a dose of pneumococcal polysaccharide (PPSV23) vaccine. The PPSV23 vaccine dose should be obtained at least 8 weeks after the dose of PCV13 vaccine. An adult aged 19  years or older who has certain medical conditions and previously received 1 or more doses of PPSV23 vaccine should receive 1 dose of PCV13. The PCV13 vaccine dose should be obtained 1 or more years after the last PPSV23 vaccine dose.  Pneumococcal polysaccharide (PPSV23) vaccine. When PCV13 is also indicated, PCV13 should be obtained first. All adults aged 65 years and older should be immunized. An adult younger than age 65 years who has certain medical conditions should be immunized. Any person who resides in a nursing home or long-term care facility should be immunized. An adult smoker should be immunized. People with an immunocompromised condition and certain other conditions should receive both PCV13 and PPSV23 vaccines. People with human immunodeficiency virus (HIV) infection should be immunized as soon as possible after diagnosis. Immunization during chemotherapy or radiation therapy should be avoided. Routine use of PPSV23 vaccine is not recommended for American Indians, [place] Natives, or people younger than 65 years unless there are medical conditions that require PPSV23 vaccine. When indicated, people who have unknown immunization and have no record of immunization should receive PPSV23 vaccine. One-time revaccination 5 years after the first dose of PPSV23 is recommended for people aged 19-64 years who have chronic kidney failure, nephrotic syndrome, asplenia, or immunocompromised conditions. People who received 1-2 doses of PPSV23 before age 65 years should receive another dose of PPSV23 vaccine at age 65 years or later if at least 5 years have passed since the previous dose. Doses of PPSV23 are not needed for people immunized with PPSV23 at or after age 65 years.  Meningococcal vaccine. Adults with asplenia or persistent complement component deficiencies should receive 2 doses of quadrivalent meningococcal conjugate (MenACWY-D) vaccine. The doses should be obtained at least 2 months apart.  Microbiologists working with certain meningococcal bacteria, military recruits, people at risk during an outbreak, and people who travel to or live in countries with a high rate of meningitis should be immunized. A first-year college student up through age   21 years who is living in a residence hall should receive a dose if she did not receive a dose on or after her 16th birthday. Adults who have certain high-risk conditions should receive one or more doses of vaccine.  Hepatitis A vaccine. Adults who wish to be protected from this disease, have certain high-risk conditions, work with hepatitis A-infected animals, work in hepatitis A research labs, or travel to or work in countries with a high rate of hepatitis A should be immunized. Adults who were previously unvaccinated and who anticipate close contact with an international adoptee during the first 60 days after arrival in the [place] from a country with a high rate of hepatitis A should be immunized.  Hepatitis B vaccine. Adults who wish to be protected from this disease, have certain high-risk conditions, may be exposed to blood or other infectious body fluids, are household contacts or sex partners of hepatitis B positive people, are clients or workers in certain care facilities, or travel to or work in countries with a high rate of hepatitis B should be immunized.  Haemophilus influenzae type b (Hib) vaccine. A previously unvaccinated person with asplenia or sickle cell disease or having a scheduled splenectomy should receive 1 dose of Hib vaccine. Regardless of previous immunization, a recipient of a hematopoietic stem cell transplant should receive a 3-dose series 6-12 months after her successful transplant. Hib vaccine is not recommended for adults with HIV infection. Preventive Services / Frequency Ages 64 to 68 years  Blood pressure check.** / Every 1 to 2 years.  Lipid and cholesterol check.** / Every 5 years beginning at age  22.  Clinical breast exam.** / Every 3 years for women in their 88s and 53s.  BRCA-related cancer risk assessment.** / For women who have family members with a BRCA-related cancer (breast, ovarian, tubal, or peritoneal cancers).  Pap test.** / Every 2 years from ages 90 through 51. Every 3 years starting at age 21 through age 56 or 3 with a history of 3 consecutive normal Pap tests.  HPV screening.** / Every 3 years from ages 24 through ages 1 to 46 with a history of 3 consecutive normal Pap tests.  Hepatitis C blood test.** / For any individual with known risks for hepatitis C.  Skin self-exam. / Monthly.  Influenza vaccine. / Every year.  Tetanus, diphtheria, and acellular pertussis (Tdap, Td) vaccine.** / Consult your health care provider. Pregnant women should receive 1 dose of Tdap vaccine during each pregnancy. 1 dose of Td every 10 years.  Varicella vaccine.** / Consult your health care provider. Pregnant females who do not have evidence of immunity should receive the first dose after pregnancy.  HPV vaccine. / 3 doses over 6 months, if 72 and younger. The vaccine is not recommended for use in pregnant females. However, pregnancy testing is not needed before receiving a dose.  Measles, mumps, rubella (MMR) vaccine.** / You need at least 1 dose of MMR if you were born in 1957 or later. You may also need a 2nd dose. For females of childbearing age, rubella immunity should be determined. If there is no evidence of immunity, females who are not pregnant should be vaccinated. If there is no evidence of immunity, females who are pregnant should delay immunization until after pregnancy.  Pneumococcal 13-valent conjugate (PCV13) vaccine.** / Consult your health care provider.  Pneumococcal polysaccharide (PPSV23) vaccine.** / 1 to 2 doses if you smoke cigarettes or if you have certain conditions.  Meningococcal vaccine.** /  1 dose if you are age 19 to 21 years and a first-year college  student living in a residence hall, or have one of several medical conditions, you need to get vaccinated against meningococcal disease. You may also need additional booster doses.  Hepatitis A vaccine.** / Consult your health care provider.  Hepatitis B vaccine.** / Consult your health care provider.  Haemophilus influenzae type b (Hib) vaccine.** / Consult your health care provider. Ages 40 to 64 years  Blood pressure check.** / Every 1 to 2 years.  Lipid and cholesterol check.** / Every 5 years beginning at age 20 years.  Lung cancer screening. / Every year if you are aged 55-80 years and have a 30-pack-year history of smoking and currently smoke or have quit within the past 15 years. Yearly screening is stopped once you have quit smoking for at least 15 years or develop a health problem that would prevent you from having lung cancer treatment.  Clinical breast exam.** / Every year after age 40 years.  BRCA-related cancer risk assessment.** / For women who have family members with a BRCA-related cancer (breast, ovarian, tubal, or peritoneal cancers).  Mammogram.** / Every year beginning at age 40 years and continuing for as long as you are in good health. Consult with your health care provider.  Pap test.** / Every 3 years starting at age 30 years through age 65 or 70 years with a history of 3 consecutive normal Pap tests.  HPV screening.** / Every 3 years from ages 30 years through ages 65 to 70 years with a history of 3 consecutive normal Pap tests.  Fecal occult blood test (FOBT) of stool. / Every year beginning at age 50 years and continuing until age 75 years. You may not need to do this test if you get a colonoscopy every 10 years.  Flexible sigmoidoscopy or colonoscopy.** / Every 5 years for a flexible sigmoidoscopy or every 10 years for a colonoscopy beginning at age 50 years and continuing until age 75 years.  Hepatitis C blood test.** / For all people born from 1945 through  1965 and any individual with known risks for hepatitis C.  Skin self-exam. / Monthly.  Influenza vaccine. / Every year.  Tetanus, diphtheria, and acellular pertussis (Tdap/Td) vaccine.** / Consult your health care provider. Pregnant women should receive 1 dose of Tdap vaccine during each pregnancy. 1 dose of Td every 10 years.  Varicella vaccine.** / Consult your health care provider. Pregnant females who do not have evidence of immunity should receive the first dose after pregnancy.  Zoster vaccine.** / 1 dose for adults aged 60 years or older.  Measles, mumps, rubella (MMR) vaccine.** / You need at least 1 dose of MMR if you were born in 1957 or later. You may also need a 2nd dose. For females of childbearing age, rubella immunity should be determined. If there is no evidence of immunity, females who are not pregnant should be vaccinated. If there is no evidence of immunity, females who are pregnant should delay immunization until after pregnancy.  Pneumococcal 13-valent conjugate (PCV13) vaccine.** / Consult your health care provider.  Pneumococcal polysaccharide (PPSV23) vaccine.** / 1 to 2 doses if you smoke cigarettes or if you have certain conditions.  Meningococcal vaccine.** / Consult your health care provider.  Hepatitis A vaccine.** / Consult your health care provider.  Hepatitis B vaccine.** / Consult your health care provider.  Haemophilus influenzae type b (Hib) vaccine.** / Consult your health care provider. Ages 65   years and over  Blood pressure check.** / Every 1 to 2 years.  Lipid and cholesterol check.** / Every 5 years beginning at age 54 years.  Lung cancer screening. / Every year if you are aged 30-80 years and have a 30-pack-year history of smoking and currently smoke or have quit within the past 15 years. Yearly screening is stopped once you have quit smoking for at least 15 years or develop a health problem that would prevent you from having lung cancer  treatment.  Clinical breast exam.** / Every year after age 26 years.  BRCA-related cancer risk assessment.** / For women who have family members with a BRCA-related cancer (breast, ovarian, tubal, or peritoneal cancers).  Mammogram.** / Every year beginning at age 61 years and continuing for as long as you are in good health. Consult with your health care provider.  Pap test.** / Every 3 years starting at age 33 years through age 17 or 17 years with 3 consecutive normal Pap tests. Testing can be stopped between 65 and 70 years with 3 consecutive normal Pap tests and no abnormal Pap or HPV tests in the past 10 years.  HPV screening.** / Every 3 years from ages 54 years through ages 75 or 53 years with a history of 3 consecutive normal Pap tests. Testing can be stopped between 65 and 70 years with 3 consecutive normal Pap tests and no abnormal Pap or HPV tests in the past 10 years.  Fecal occult blood test (FOBT) of stool. / Every year beginning at age 29 years and continuing until age 39 years. You may not need to do this test if you get a colonoscopy every 10 years.  Flexible sigmoidoscopy or colonoscopy.** / Every 5 years for a flexible sigmoidoscopy or every 10 years for a colonoscopy beginning at age 43 years and continuing until age 62 years.  Hepatitis C blood test.** / For all people born from [birth year] through 1964 and any individual with known risks for hepatitis C.  Osteoporosis screening.** / A one-time screening for women ages 78 years and over and women at risk for fractures or osteoporosis.  Skin self-exam. / Monthly.  Influenza vaccine. / Every year.  Tetanus, diphtheria, and acellular pertussis (Tdap/Td) vaccine.** / 1 dose of Td every 10 years.  Varicella vaccine.** / Consult your health care provider.  Zoster vaccine.** / 1 dose for adults aged 27 years or older.  Pneumococcal 13-valent conjugate (PCV13) vaccine.** / Consult your health care provider.  Pneumococcal  polysaccharide (PPSV23) vaccine.** / 1 dose for all adults aged 25 years and older.  Meningococcal vaccine.** / Consult your health care provider.  Hepatitis A vaccine.** / Consult your health care provider.  Hepatitis B vaccine.** / Consult your health care provider.  Haemophilus influenzae type b (Hib) vaccine.** / Consult your health care provider. ** Family history and personal history of risk and conditions may change your health care provider's recommendations. Document Released: 11/01/2001 Document Revised: 01/20/2014 Document Reviewed: 01/31/2011 [facility] Patient Information 2015 [place], [place]. This information is not intended to replace advice given to you by your health care provider. Make sure you discuss any questions you have with your health care provider.

## 2015-03-09 LAB — VITAMIN D 1,25 DIHYDROXY
Vitamin D 1, 25 (OH)2 Total: 39 pg/mL (ref 18–72)
Vitamin D2 1, 25 (OH)2: <8 pg/mL
Vitamin D3 1, 25 (OH)2: 39 pg/mL

## 2015-03-13 ENCOUNTER — Other Ambulatory Visit: Payer: Self-pay

## 2015-03-25 ENCOUNTER — Telehealth: Payer: Self-pay | Admitting: Family Medicine

## 2015-03-25 NOTE — Telephone Encounter (Signed)
Pt called to see if we can send medical records from last office visit, including any labs work/imaging/etc, to Dr. Lynn Ito. Advised pt she needs to come in and sign release for records.

## 2015-03-30 DIAGNOSIS — F419 Anxiety disorder, unspecified: Secondary | ICD-10-CM | POA: Diagnosis not present

## 2015-03-30 DIAGNOSIS — G20 Parkinson's disease: Secondary | ICD-10-CM | POA: Diagnosis not present

## 2015-05-27 ENCOUNTER — Telehealth: Payer: Self-pay

## 2015-05-27 ENCOUNTER — Ambulatory Visit: Payer: Medicare Other | Admitting: Neurology

## 2015-05-27 NOTE — Telephone Encounter (Signed)
Patient did not show to appt.  

## 2015-06-02 ENCOUNTER — Encounter: Payer: Self-pay | Admitting: Neurology

## 2015-07-09 DIAGNOSIS — Z0289 Encounter for other administrative examinations: Secondary | ICD-10-CM

## 2015-07-15 NOTE — Telephone Encounter (Signed)
Error

## 2015-07-28 ENCOUNTER — Encounter: Payer: Self-pay | Admitting: Neurology

## 2015-07-28 ENCOUNTER — Ambulatory Visit (INDEPENDENT_AMBULATORY_CARE_PROVIDER_SITE_OTHER): Payer: Medicare Other | Admitting: Neurology

## 2015-07-28 VITALS — BP 88/50 | HR 61 | Wt 119.0 lb

## 2015-07-28 DIAGNOSIS — G20 Parkinson's disease: Secondary | ICD-10-CM | POA: Diagnosis not present

## 2015-07-28 NOTE — Progress Notes (Signed)
Subjective:    Patient ID: Amanda [name] is a 69 y.o. female.  HPI     Interim history:   Amanda [name] is a very pleasant 69 year old right-handed woman with an underlying medical history of vitamin D deficiency, osteopenia, hyperlipidemia, and anxiety, who presents for followup consultation of her right sided predominant Parkinson's disease, complicated by mood disorder, in particular anxiety disorder. She is accompanied by her husband again today. Of note, she no showed for an appointment on 05/27/2015. I last saw her on 02/24/2015, at which time she reported worsening tremor on the right. She had reduced her Zoloft to 25 mg. She was off of Azilect. An appointment with Dr. Linus Mako at [facility] was pending for 07/06/016. Dr. Erling Cruz had tried her on Azilect and she was on this for a few years. She reported stress, particularly because of her son's divorce and she was helping out with childcare for his children. She had seen a psychiatrist some years ago. Her husband felt that her anxiety was worse and she admitted that her daughter felt Zoloft had helped. She had not been able to tolerate Lexapro. I suggested she continue with the current medications and we not make any changes until she saw Dr. Linus Mako at [facility].  Today, 07/28/15: She reports doing okay,and generally speaking tolerating the new medication, pramipexole which is now 0.75 mg 3 times a day. She has intermittent milder headaches, she has some nausea but not disabling and no vomiting. She has no significant somnolence. Memory is stable, and she feels that her mood if anything is better than before. She was seen by Dr. Linus Mako at [facility] on 03/30/2015. I reviewed his notes which were very detailed. He agreed with the diagnosis of Parkinson's disease and suggested low-dose Mirapex with gradual titration up to 0.75 mg 3 times a day. He felt that in the future she may be a good candidate for surgical treatment of Parkinson's  disease. He did agree that she had overall mild findings and surgical candidacy was only mentioned for his own reference. He suggested a one-year or 15 month follow-up with him. I greatly appreciate his input.  She reports ongoing stress at home, what with her husband's medical condition, her son's divorce, and her daughter's 50-year-old with developmental delays  Previously:  I saw her on 09/25/2014, at which time she reported that her tremor was worse. She had not tried the Lexapro for fear of side effects. Her husband reported that her short-term memory was worse. She was more fatigued. She was having a lot of stress. She was not sleeping very well. She was not sure of Azilect was helpful. We mutually agreed to taper off of it.  In the interim, she tried Lexapro but felt she had side effects after a couple of days. She called back in March and she reported that she had tried Zoloft several years ago and wanted to try it again. I prescribed Zoloft 25 mg for her. She called back in April and wanted to increase the Zoloft. We increase this to 50 mg. In the interim, she was supposed to have bladder surgery, but did not have it yet. She called back in June, stating that she felt worse with Zoloft 50 mg. I suggested she decrease it back to 25 mg.   I saw her on 03/25/2014, at which time she reported feeling stable with regards to her motor symptoms. She had a recent UTI was treated with ciprofloxacin. Her husband reported symptoms of depression  and anxiety and I suggested she could start a low dose Lexapro and provided her with a prescription for this. She was continued on Azilect. She was given a prescription for diclofenac topical for left upper arm pain from Dr. Tamala Julian.  I saw her on 09/23/2013, at which time I felt she was stable and continued her on Azilect once daily. In the interim she was seen by Dr. Tamala Julian in sports medicine because of left arm pain since October 2014 after a flu shot; she has since  then improved in that regard, using a compression bandage, which he provided.  I first met her on 03/27/2013 at which time I suggested no new medication with the exception of consideration of adding coenzyme Q 10 over-the-counter. She has been on Azilect.    She previously followed with Dr. Morene Antu and was last seen by him on 11/21/2012, at which time Dr. Erling Cruz felt that she was doing very well. She has had some increase in tremors over time. He did not suggest any medication changes at the time.    She first noticed a right hand tremor in April 2010 followed by a right leg tremor in June 2010. MRI brain with and without contrast showed nonspecific white matter hyperintensities, 24-hour urine for heavy metals was negative. CBC, CMP, TSH, RPR, ESR and serum ceruloplasmin levels were normal. She started rasagiline in November 2010 and tolerated it well. There has been some concern about her memory. She denies any hallucinations, depression or orthostatic hypotension. She does not exercise regularly.    Her Past Medical History Is Significant For: Past Medical History  Diagnosis Date  . Anxiety   . Osteopenia   . Runner's knee   . Parkinson disease [facility])     Her Past Surgical History Is Significant For: Past Surgical History  Procedure Laterality Date  . Ovarian cyst removal  11-2006  . Laparoscopy      Her Family History Is Significant For: Family History  Problem Relation Age of Onset  . Heart disease Mother     enlarged heart  . Cancer Father     pancreatic  . COPD Sister   . Pulmonary embolism Sister   . Heart disease Brother     Her Social History Is Significant For: Social History   Social History  . Marital Status: Married    Spouse Name: Chrissie Noa  . Number of Children: 3  . Years of Education: College   Occupational History  . retired    Social History Main Topics  . Smoking status: Never Smoker   . Smokeless tobacco: Never Used  . Alcohol Use: Yes     Comment:  1 glass of wine occasionally  . Drug Use: No  . Sexual Activity:    Partners: Male   Other Topics Concern  . None   Social History Narrative   Pt lives at home with family.   Caffeine Use: some, occasionally    Her Allergies Are:  Allergies  Allergen Reactions  . Penicillins Anaphylaxis  :   Her Current Medications Are:  Outpatient Encounter Prescriptions as of 07/28/2015  Medication Sig  . b complex vitamins tablet Take 1 tablet by mouth daily. MEGA FOOD  . Cholecalciferol (VITAMIN D3) 1000 UNIT/SPRAY LIQD Take by mouth 2 (two) times daily.  . NON FORMULARY Progesterone and Bi-est Cream as directed   . NON FORMULARY Grow Bone Calcium 2 caps twice a day  . Omega-3 Fatty Acids (FISH OIL) 1200 MG CAPS Take  by mouth daily.    . pramipexole (MIRAPEX) 0.75 MG tablet 0.75 mg 3 (three) times daily.  . Nutritional Supplements (DHEA PO) Take 5 mg by mouth daily.  . [DISCONTINUED] UNABLE TO FIND 2 (two) times daily. Mykind organics, vegan D3, 1000iu   No facility-administered encounter medications on file as of 07/28/2015.  :  Review of Systems:  Out of a complete 14 point review of systems, all are reviewed and negative with the exception of these symptoms as listed below:  Review of Systems  Occasional nausea, some loose stool, some constipation, some headaches.   Objective:  Neurologic Exam  Physical Exam Physical Examination:   Filed Vitals:   07/28/15 1554  BP: 88/50  Pulse: 61   General Examination: The patient is a very pleasant 69 y.o. female in no acute distress. She is anxious appearing. Speech is slightly pressured.  HEENT: Normocephalic, atraumatic, pupils are equal, round and reactive to light and accommodation. Extraocular tracking shows mild saccadic breakdown without nystagmus noted. There is limitation to upper gaze. There is mild decrease in eye blink rate. Hearing is intact. Face is symmetric with mild facial masking and normal facial sensation. There is  no lip, neck or jaw tremor. Neck is mildly rigid with intact passive ROM. There are no carotid bruits on auscultation. Oropharynx exam reveals mild mouth dryness. No significant airway crowding is noted. Mallampati is class I. Tongue protrudes centrally and palate elevates symmetrically. There is no drooling.   Chest: is clear to auscultation without wheezing, rhonchi or crackles noted.  Heart: sounds are regular and normal without murmurs, rubs or gallops noted.   Abdomen: is soft, non-tender and non-distended with normal bowel sounds appreciated on auscultation.  Extremities: There is no pitting edema in the distal lower extremities bilaterally. Pedal pulses are intact. There are no varicose veins.  Skin: is warm and dry with no trophic changes noted.    Musculoskeletal: exam reveals no obvious joint deformities, tenderness, joint swelling or erythema.  Neurologically:  Mental status: The patient is awake and alert, paying good attention. She is able to completely provide the history. Her husband provides some details. She is oriented to: person, place, time/date, situation, day of week, month of year and year. Her memory, attention, language and knowledge are intact. There is no aphasia, agnosia, apraxia or anomia. There is a no significant degree of bradyphrenia. Speech is very mildly hypophonic with no dysarthria noted. Mood is congruent and affect is normal.    Cranial nerves are as described above under HEENT exam. In addition, shoulder shrug is normal with equal shoulder height noted.  Motor exam: Normal bulk, and strength for age is noted. There are no dyskinesias noted.  Tone is mildly rigid with presence of cogwheeling in the right upper extremity. There is mild bradykinesia. There is no drift or rebound.  There is a moderate RUE and no significant RLE resting tremor today. She has no left upper extremity tremor.  Romberg is negative.  Reflexes are 3+ in the upper and lower  extremities.  Fine motor skills exam: Finger taps are mildly impaired on the right and minimally impaired on the left. Hand movements are mildly impaired on the right and minimally impaired on the left. RAP (rapid alternating patting) is minimally impaired on the right and not impaired on the left. Foot taps are mildly impaired on the right and minimally impaired on the left. Foot agility (in the form of heel stomping) is mildly impaired on the right and  minimally impaired on the left.    Cerebellar testing shows no dysmetria or intention tremor on finger to nose testing. Heel to shin is unremarkable bilaterally. There is no truncal or gait ataxia.   Sensory exam is intact to light touch in the upper and lower extremities.   Gait, station and balance: She stands up from the seated position with minimal difficulty and does not need to push up with her hands. She needs no assistance. No veering to one side is noted. She is not noted to lean to the side. Posture is mildly stooped, stable. Stance is narrow-based. She walks with mild decrease in stride length and pace and decreased arm swing on the right. She turns in 3 steps. Tandem walk is possible with difficulty. Balance is preserved.    Assessment and Plan:   In summary, Jennie Hannay is a very pleasant 69 year old female with an underlying medical history of vitamin D deficiency, osteopenia, hyperlipidemia, and anxiety, who presents for followup consultation of her right sided predominant Parkinson's disease. Her symptoms and since have been slowly progressive, more noticeable since January 2016. She has been off of Azilect. I do believe that she may be somewhat worse because she is off of Azilect. She's not convinced. She is currently on Mirapex 0.75 mg tid. She did not tolerate Lexapro and no longer is on sertraline. Today, I suggested that she stay on the same medication and we talked about modifying lifestyle including adding more exercise, and she  is encouraged to drink more water. Mood is fairly good at this time. Memory is stable. I suggested a 3 month checkup. We talked at length today about future perspective and treatment options down the road. We talked about potentially utilizing levodopa therapy down the road. We Have previously had long chat about symptomatic treatment of Parkinson's disease but she had repeatedly expressed reluctance to try anything new. At this juncture, she is encouraged to continue with the current dose of Mirapex generic 0.75 mg 3 times a day. Her tremor seems to have improved. She seems to tolerate the medication quite well.I would like to see her back in about 5 months and we will try to align her appointment with her husband's appointment as well. I answered all their questions today and the patient and her husband were in agreement with the above outlined plan.  I spent 40 minutes in total face-to-face time with the patient, more than 50% of which was spent in counseling and coordination of care, reviewing test results, reviewing medication and discussing or reviewing the diagnosis of PD, the prognosis and treatment options.

## 2015-07-28 NOTE — Patient Instructions (Signed)
I think your Parkinson's disease has remained fairly stable, which is reassuring. Nevertheless, as you know, this disease does progress with time. It can affect your balance, your memory, your mood, your bowel and bladder function, your posture, balance and walking and your activities of daily living. However, there are good supportive treatments and symptomatic treatments available, so most patients have a change to a good quality life and life expectancy is not typically altered. Overall you are doing fairly well but I do want to suggest a few things today:  Remember to drink plenty of fluid at least 6 glasses (8 oz each), eat healthy meals and do not skip any meals. Try to eat protein with a every meal and eat a healthy snack such as fruit or nuts in between meals. Try to keep a regular sleep-wake schedule and try to exercise daily, particularly in the form of walking, 20-30 minutes a day, if you can.   Taking your medication on schedule is key.    Try to stay active physically and mentally. Engage in social activities in your community and with your family and try to keep up with current events by reading the newspaper or watching the news. Try to do word puzzles and you may like to do puzzles and brain games on the computer such as on [email or web address]/.   As far as your medications are concerned, I would like to suggest that you take your current medication with the following additional changes: no changes.      I would like to see you back in 5 months, sooner if we need to. Please call us with any interim questions, concerns, problems, updates or refill requests.  Our phone number is [phone]. We also have an after hours call service for urgent matters and there is a physician on-call for urgent questions, that cannot wait till the next work day. For any emergencies you know to call 911 or go to the nearest emergency room.   You can email me through my chart and also leave a phone message for  Beverlee Nims, my nurse.

## 2015-08-03 DIAGNOSIS — H04123 Dry eye syndrome of bilateral lacrimal glands: Secondary | ICD-10-CM | POA: Diagnosis not present

## 2015-08-03 DIAGNOSIS — H2513 Age-related nuclear cataract, bilateral: Secondary | ICD-10-CM | POA: Diagnosis not present

## 2015-10-21 DIAGNOSIS — N812 Incomplete uterovaginal prolapse: Secondary | ICD-10-CM | POA: Diagnosis not present

## 2015-10-26 ENCOUNTER — Other Ambulatory Visit: Payer: Self-pay

## 2015-10-26 DIAGNOSIS — Z1231 Encounter for screening mammogram for malignant neoplasm of breast: Secondary | ICD-10-CM

## 2015-11-10 ENCOUNTER — Ambulatory Visit
Admission: RE | Admit: 2015-11-10 | Discharge: 2015-11-10 | Disposition: A | Discharge status: Home / Self Care | Payer: Medicare Other | Source: Ambulatory Visit

## 2015-11-10 ENCOUNTER — Other Ambulatory Visit: Payer: Self-pay

## 2015-11-10 DIAGNOSIS — Z1231 Encounter for screening mammogram for malignant neoplasm of breast: Secondary | ICD-10-CM

## 2015-11-13 DIAGNOSIS — N3641 Hypermobility of urethra: Secondary | ICD-10-CM | POA: Diagnosis not present

## 2015-11-13 DIAGNOSIS — N393 Stress incontinence (female) (male): Secondary | ICD-10-CM | POA: Diagnosis not present

## 2015-11-13 DIAGNOSIS — K5909 Other constipation: Secondary | ICD-10-CM | POA: Diagnosis not present

## 2015-11-13 DIAGNOSIS — N8111 Cystocele, midline: Secondary | ICD-10-CM | POA: Diagnosis not present

## 2015-11-13 DIAGNOSIS — G20 Parkinson's disease: Secondary | ICD-10-CM | POA: Diagnosis not present

## 2015-11-13 DIAGNOSIS — Z0000 Encounter for general adult medical examination without abnormal findings: Secondary | ICD-10-CM | POA: Diagnosis not present

## 2015-11-18 ENCOUNTER — Other Ambulatory Visit: Payer: Self-pay | Admitting: Urology

## 2015-11-19 DIAGNOSIS — N8111 Cystocele, midline: Secondary | ICD-10-CM | POA: Diagnosis not present

## 2015-11-19 DIAGNOSIS — N393 Stress incontinence (female) (male): Secondary | ICD-10-CM | POA: Diagnosis not present

## 2015-11-19 DIAGNOSIS — D259 Leiomyoma of uterus, unspecified: Secondary | ICD-10-CM | POA: Diagnosis not present

## 2015-11-24 ENCOUNTER — Telehealth: Payer: Self-pay | Admitting: Neurology

## 2015-11-24 NOTE — Telephone Encounter (Signed)
Patient called to advise of surgery 01/11/16 Dr. Gaynelle Arabian and Dr. Helane Rima, patient needs to know if pramipexole (MIRAPEX) 0.75 MG tablet will conflict with anesthesia.

## 2015-11-24 NOTE — Telephone Encounter (Signed)
Please call patient back: I'm not aware of any interaction between pramipexole and anesthesia. Her anesthesiologist will be able to confirm this, as he or she will know what anesthesia they will use at the time.

## 2015-11-25 NOTE — Telephone Encounter (Signed)
LM with information below and our call back number for further questions.

## 2015-11-30 DIAGNOSIS — K5909 Other constipation: Secondary | ICD-10-CM | POA: Diagnosis not present

## 2015-11-30 DIAGNOSIS — N8111 Cystocele, midline: Secondary | ICD-10-CM | POA: Diagnosis not present

## 2015-11-30 DIAGNOSIS — M6281 Muscle weakness (generalized): Secondary | ICD-10-CM | POA: Diagnosis not present

## 2015-11-30 DIAGNOSIS — R278 Other lack of coordination: Secondary | ICD-10-CM | POA: Diagnosis not present

## 2015-11-30 DIAGNOSIS — G20 Parkinson's disease: Secondary | ICD-10-CM | POA: Diagnosis not present

## 2015-12-08 DIAGNOSIS — M6281 Muscle weakness (generalized): Secondary | ICD-10-CM | POA: Diagnosis not present

## 2015-12-08 DIAGNOSIS — N8111 Cystocele, midline: Secondary | ICD-10-CM | POA: Diagnosis not present

## 2015-12-08 DIAGNOSIS — R278 Other lack of coordination: Secondary | ICD-10-CM | POA: Diagnosis not present

## 2015-12-08 DIAGNOSIS — N393 Stress incontinence (female) (male): Secondary | ICD-10-CM | POA: Diagnosis not present

## 2015-12-08 DIAGNOSIS — K5909 Other constipation: Secondary | ICD-10-CM | POA: Diagnosis not present

## 2015-12-08 DIAGNOSIS — G20 Parkinson's disease: Secondary | ICD-10-CM | POA: Diagnosis not present

## 2015-12-10 ENCOUNTER — Other Ambulatory Visit: Payer: Self-pay | Admitting: Urology

## 2015-12-11 MED ORDER — FLUORESCEIN SODIUM 10 % IJ SOLN
500.0000 mg | Freq: Once | INTRAMUSCULAR | Status: AC
  Administered 2016-01-11 (×2): 50 mg via INTRAVENOUS

## 2015-12-17 DIAGNOSIS — K5909 Other constipation: Secondary | ICD-10-CM | POA: Diagnosis not present

## 2015-12-17 DIAGNOSIS — M6281 Muscle weakness (generalized): Secondary | ICD-10-CM | POA: Diagnosis not present

## 2015-12-17 DIAGNOSIS — N393 Stress incontinence (female) (male): Secondary | ICD-10-CM | POA: Diagnosis not present

## 2015-12-17 DIAGNOSIS — R278 Other lack of coordination: Secondary | ICD-10-CM | POA: Diagnosis not present

## 2015-12-17 DIAGNOSIS — G20 Parkinson's disease: Secondary | ICD-10-CM | POA: Diagnosis not present

## 2015-12-17 DIAGNOSIS — N8111 Cystocele, midline: Secondary | ICD-10-CM | POA: Diagnosis not present

## 2015-12-28 ENCOUNTER — Ambulatory Visit: Payer: Self-pay | Admitting: Neurology

## 2016-01-03 NOTE — H&P (Signed)
Amanda [name] is an 70 y.o. G 3 P 3 PMP with symptomatic Pelvic Organ Prolapse. She has complete uterine procidentia  Pertinent Gynecological History: Menses: post-menopausal Bleeding: none Contraception: none DES exposure: denies Blood transfusions: none Sexually transmitted diseases: no past history Previous GYN Procedures: none  Last mammogram: normal Date: 2017  Last pap: normal Date: 2017 OB History: G3, P3   Menstrual History: Menarche age: unknown  No LMP recorded. Patient is postmenopausal.    Past Medical History  Diagnosis Date  . Anxiety   . Osteopenia   . Runner's knee   . Parkinson disease [facility])     Past Surgical History  Procedure Laterality Date  . Ovarian cyst removal  11-2006  . Laparoscopy      Family History  Problem Relation Age of Onset  . Heart disease Mother     enlarged heart  . Cancer Father     pancreatic  . COPD Sister   . Pulmonary embolism Sister   . Heart disease Brother     Social History:  reports that she has never smoked. She has never used smokeless tobacco. She reports that she drinks alcohol. She reports that she does not use illicit drugs.  Allergies:  Allergies  Allergen Reactions  . Penicillins Anaphylaxis    No prescriptions prior to admission    Review of Systems  All other systems reviewed and are negative.   There were no vitals taken for this visit. Physical Exam  Vitals reviewed. Constitutional: She appears well-developed.  HENT:  Head: Normocephalic.  Eyes: Pupils are equal, round, and reactive to light.  Neck: Normal range of motion.  Cardiovascular: Normal rate, regular rhythm and normal heart sounds.   Respiratory: Effort normal.  GI: Soft.  Genitourinary:       No results found for this or any previous visit (from the past 24 hour(s)).  No results found.  Assessment/Plan: Total Uterine Procidentia   Urology will perform cystoscopy with stents  TVH possible BSO Urology to perform  anterior repair with sling Risks reviewed  Consent signed  Onica Davidovich L 01/03/2016, 6:18 PM

## 2016-01-04 ENCOUNTER — Ambulatory Visit (INDEPENDENT_AMBULATORY_CARE_PROVIDER_SITE_OTHER): Payer: Medicare Other | Admitting: Neurology

## 2016-01-04 ENCOUNTER — Encounter: Payer: Self-pay | Admitting: Neurology

## 2016-01-04 VITALS — BP 114/60 | HR 68 | Resp 16 | Ht 65.0 in | Wt 125.0 lb

## 2016-01-04 DIAGNOSIS — G20 Parkinson's disease: Secondary | ICD-10-CM

## 2016-01-04 NOTE — Patient Instructions (Addendum)
I don't think you have to stop your pramipexole for your surgery next week, but please discuss with the surgeon and anesthesiologist.  Let's keep your Mirapex the same, and consider increasing it next time, about 4 month from now.

## 2016-01-04 NOTE — Progress Notes (Signed)
Subjective:    Patient ID: Amanda [name] is a 70 y.o. female.  HPI     Interim history:   Amanda [name] is a very pleasant 70 year old right-handed woman with an underlying medical history of vitamin D deficiency, osteopenia, hyperlipidemia, and anxiety, who presents for followup consultation of her right sided predominant Parkinson's disease, complicated by mood disorder, in particular anxiety disorder. She is accompanied by her husband again today. I last saw her on 07/28/2015, at which time she reported doing okay. She was on pramipexole 0.75 mg 3 times a day. She had some intermittent headaches, some nausea, no vomiting. She had no significant somnolence. Memory was stable. She saw Dr. Linus Mako at [facility] on 03/30/2015. I reviewed his notes which were very detailed. He agreed with the diagnosis of Parkinson's disease and suggested low-dose Mirapex with gradual titration up to 0.75 mg 3 times a day. He felt that in the future she may be a good candidate for surgical treatment of Parkinson's disease. He did agree that she had overall mild findings and surgical candidacy was only mentioned for his own reference (for future consideration). He suggested a one-year or 15 month follow-up with him. She reported stress at home, what with her husband's medical conditions, her son's divorce, and her daughter's 73-year-old with developmental delays.   Today, 01/04/2016: She reports doing okay, tolerating the Mirapex at the current dose. Sometimes when she misses a dose she feels a headache come on. She is scheduled for bladder surgery under Dr. Gaynelle Arabian with ureteral stent placement and bladder prolapse surgery next week and also at the same time she will have GYN surgery with hysterectomy and potential bilateral oophorectomy if possible through Dr. Helane Rima. Surgery floppy under spinal anesthesia. The patient is wondering if she needs to come off of Mirapex as the package insert states not to combine it with  any other sedating medication.  Previously:    Of note, she no showed for an appointment on 05/27/2015. I saw her on 02/24/2015, at which time she reported worsening tremor on the right. She had reduced her Zoloft to 25 mg. She was off of Azilect. An appointment with Dr. Linus Mako at [facility] was pending for 07/06/016. Dr. Erling Cruz had tried her on Azilect and she was on this for a few years. She reported stress, particularly because of her son's divorce and she was helping out with childcare for his children. She had seen a psychiatrist some years ago. Her husband felt that her anxiety was worse and she admitted that her daughter felt Zoloft had helped. She had not been able to tolerate Lexapro. I suggested she continue with the current medications and we not make any changes until she saw Dr. Linus Mako at [facility].  I saw her on 09/25/2014, at which time she reported that her tremor was worse. She had not tried the Lexapro for fear of side effects. Her husband reported that her short-term memory was worse. She was more fatigued. She was having a lot of stress. She was not sleeping very well. She was not sure of Azilect was helpful. We mutually agreed to taper off of it.  In the interim, she tried Lexapro but felt she had side effects after a couple of days. She called back in March and she reported that she had tried Zoloft several years ago and wanted to try it again. I prescribed Zoloft 25 mg for her. She called back in April and wanted to increase the Zoloft. We increase this  to 50 mg. In the interim, she was supposed to have bladder surgery, but did not have it yet. She called back in June, stating that she felt worse with Zoloft 50 mg. I suggested she decrease it back to 25 mg.   I saw her on 03/25/2014, at which time she reported feeling stable with regards to her motor symptoms. She had a recent UTI was treated with ciprofloxacin. Her husband reported symptoms of depression and anxiety  and I suggested she could start a low dose Lexapro and provided her with a prescription for this. She was continued on Azilect. She was given a prescription for diclofenac topical for left upper arm pain from Dr. Tamala Julian.  I saw her on 09/23/2013, at which time I felt she was stable and continued her on Azilect once daily. In the interim she was seen by Dr. Tamala Julian in sports medicine because of left arm pain since October 2014 after a flu shot; she has since then improved in that regard, using a compression bandage, which he provided.  I first met her on 03/27/2013 at which time I suggested no new medication with the exception of consideration of adding coenzyme Q 10 over-the-counter. She has been on Azilect.    She previously followed with Dr. Morene Antu and was last seen by him on 11/21/2012, at which time Dr. Erling Cruz felt that she was doing very well. She has had some increase in tremors over time. He did not suggest any medication changes at the time.    She first noticed a right hand tremor in April 2010 followed by a right leg tremor in June 2010. MRI brain with and without contrast showed nonspecific white matter hyperintensities, 24-hour urine for heavy metals was negative. CBC, CMP, TSH, RPR, ESR and serum ceruloplasmin levels were normal. She started rasagiline in November 2010 and tolerated it well. There has been some concern about her memory. She denies any hallucinations, depression or orthostatic hypotension. She does not exercise regularly.    Her Past Medical History Is Significant For: Past Medical History  Diagnosis Date  . Anxiety   . Osteopenia   . Runner's knee   . Parkinson disease [facility])     Her Past Surgical History Is Significant For: Past Surgical History  Procedure Laterality Date  . Ovarian cyst removal  11-2006  . Laparoscopy      Her Family History Is Significant For: Family History  Problem Relation Age of Onset  . Heart disease Mother     enlarged heart  . Cancer  Father     pancreatic  . COPD Sister   . Pulmonary embolism Sister   . Heart disease Brother     Her Social History Is Significant For: Social History   Social History  . Marital Status: Married    Spouse Name: Chrissie Noa  . Number of Children: 3  . Years of Education: College   Occupational History  . retired    Social History Main Topics  . Smoking status: Never Smoker   . Smokeless tobacco: Never Used  . Alcohol Use: Yes     Comment: 1 glass of wine occasionally  . Drug Use: No  . Sexual Activity:    Partners: Male   Other Topics Concern  . None   Social History Narrative   Pt lives at home with family.   Caffeine Use: some, occasionally    Her Allergies Are:  Allergies  Allergen Reactions  . Penicillins Anaphylaxis  :  Her Current Medications Are:  Outpatient Encounter Prescriptions as of 01/04/2016  Medication Sig  . b complex vitamins tablet Take 1 tablet by mouth daily. MEGA FOOD  . Cholecalciferol (VITAMIN D3) 1000 UNIT/SPRAY LIQD Take by mouth 2 (two) times daily.  Marland Kitchen ESTRACE VAGINAL 0.1 MG/GM vaginal cream See admin instructions.  . NON FORMULARY Progesterone and Bi-est Cream as directed   . NON FORMULARY Grow Bone Calcium 2 caps twice a day  . Nutritional Supplements (DHEA PO) Take 5 mg by mouth daily.  . Omega-3 Fatty Acids (FISH OIL) 1200 MG CAPS Take by mouth daily.    . pramipexole (MIRAPEX) 0.75 MG tablet 0.75 mg 3 (three) times daily.   Facility-Administered Encounter Medications as of 01/04/2016  Medication  . fluorescein 10 % injection 500 mg  :  Review of Systems:  Out of a complete 14 point review of systems, all are reviewed and negative with the exception of these symptoms as listed below:   Review of Systems  Neurological:       Patient reports that she is having surgery on 4/24. Patient would like to discuss Mirapex during surgery time.     Objective:  Neurologic Exam  Physical Exam Physical Examination:   Filed Vitals:    01/04/16 1420  BP: 114/60  Pulse: 68  Resp: 16   General Examination: The patient is a very pleasant 70 y.o. female in no acute distress. She is less anxious appearing.   HEENT: Normocephalic, atraumatic, pupils are equal, round and reactive to light and accommodation. Extraocular tracking shows mild saccadic breakdown without nystagmus noted. There is limitation to upper gaze. There is mild decrease in eye blink rate. Hearing is intact. Face is symmetric with mild facial masking and normal facial sensation. There is no lip, neck or jaw tremor. Neck is mildly rigid with intact passive ROM. There are no carotid bruits on auscultation. Oropharynx exam reveals mild mouth dryness. No significant airway crowding is noted. Mallampati is class I. Tongue protrudes centrally and palate elevates symmetrically. There is no drooling.   Chest: is clear to auscultation without wheezing, rhonchi or crackles noted.  Heart: sounds are regular and normal without murmurs, rubs or gallops noted.   Abdomen: is soft, non-tender and non-distended with normal bowel sounds appreciated on auscultation.  Extremities: There is no pitting edema in the distal lower extremities bilaterally. Pedal pulses are intact. There are no varicose veins.  Skin: is warm and dry with no trophic changes noted.    Musculoskeletal: exam reveals no obvious joint deformities, tenderness, joint swelling or erythema.  Neurologically:  Mental status: The patient is awake and alert, paying good attention. She is able to completely provide the history. Her husband provides some details. She is oriented to: person, place, time/date, situation, day of week, month of year and year. Her memory, attention, language and knowledge are intact. There is no aphasia, agnosia, apraxia or anomia. There is a no significant degree of bradyphrenia. Speech is very mildly hypophonic with no dysarthria noted. Mood is congruent and affect is normal.    Cranial  nerves are as described above under HEENT exam. In addition, shoulder shrug is normal with equal shoulder height noted.  Motor exam: Normal bulk, and strength for age is noted. There are no dyskinesias noted.  Tone is mildly rigid with presence of cogwheeling in the right upper extremity. There is mild bradykinesia. There is no drift or rebound.  There is a moderate RUE and mild RLE resting tremor today.  She has no left upper extremity tremor.  Romberg is negative.  Reflexes are 2+ in the upper and lower extremities.   Fine motor skills exam: Finger taps are mildly impaired on the right and minimally impaired on the left. Hand movements are mildly impaired on the right and minimally impaired on the left. RAP (rapid alternating patting) is minimally impaired on the right and not impaired on the left. Foot taps are mildly impaired on the right and minimally impaired on the left. Foot agility (in the form of heel stomping) is mildly impaired on the right and minimally impaired on the left.    Cerebellar testing shows no dysmetria or intention tremor on finger to nose testing. Heel to shin is unremarkable bilaterally. There is no truncal or gait ataxia.   Sensory exam is intact to light touch in the upper and lower extremities.   Gait, station and balance: She stands up from the seated position with mild difficulty and does not need to push up with her hands. She needs no assistance. No veering to one side is noted. She is not noted to lean to the side. Posture is mildly stooped, stable. Stance is narrow-based. She walks with mild decrease in stride length and pace and decreased arm swing on the right. She turns in 3 steps. Tandem walk is possible with difficulty. Balance is overall preserved.    Assessment and Plan:   In summary, Amanda [name] is a very pleasant 70 year old female with an underlying medical history of vitamin D deficiency, osteopenia, hyperlipidemia, and anxiety, who presents for  followup consultation of her right sided predominant Parkinson's disease. Her symptoms and since have been slowly progressive, more noticeable since January 2016. She has been off of Azilect as she did not feel good on it. I thought she was somewhat worse because she is off of Azilect, but she was not convinced. She is currently on Mirapex 0.75 mg tid. She did not tolerate Lexapro and is no longer is on sertraline. Today, I suggested that she stay on the same medication and we talked about modifying lifestyle including adding more exercise, and she is encouraged to drink more water. Mood is fairly good at this time. Memory is stable, has mild forgetfulness. I suggested a 4 month checkup. She is scheduled for urological and gyn surgery next week. We have talked about potentially utilizing levodopa therapy down the road. I don't think she needs to come off the Mirapex, she could skip the morning dose for her surgery day, but is advised, that sedating medications will likely be given for her surgery and pain meds after surgery, which may exacerbate constipation. Next time, we may want to increase her mirapex to 1 mg tid. I would like to see her back in about 4 months and we will try to align her appointment with her husband's appointment as well. I answered all their questions today and the patient and her husband were in agreement with the above outlined plan.  I spent 25 minutes in total face-to-face time with the patient, more than 50% of which was spent in counseling and coordination of care, reviewing test results, reviewing medication and discussing or reviewing the diagnosis of PD, the prognosis and treatment options.

## 2016-01-05 ENCOUNTER — Encounter (HOSPITAL_BASED_OUTPATIENT_CLINIC_OR_DEPARTMENT_OTHER): Payer: Self-pay | Admitting: *Deleted

## 2016-01-05 DIAGNOSIS — N812 Incomplete uterovaginal prolapse: Secondary | ICD-10-CM | POA: Diagnosis not present

## 2016-01-05 NOTE — Progress Notes (Signed)
NPO AFTER MN.  ARRIVE AT 0600.  NEEDS HG AND T & S.  WILL TAKE MIRAPEX AM DOS W/ SIPS OF WATER.  PT AWARE OWER AT MAIN.

## 2016-01-10 ENCOUNTER — Encounter (HOSPITAL_BASED_OUTPATIENT_CLINIC_OR_DEPARTMENT_OTHER): Payer: Self-pay | Admitting: Anesthesiology

## 2016-01-10 NOTE — Anesthesia Preprocedure Evaluation (Addendum)
Anesthesia Evaluation  Patient identified by MRN, date of birth, ID band Patient awake    Reviewed: Allergy & Precautions, NPO status , Patient's Chart, lab work & pertinent test results  Airway Mallampati: II  TM Distance: >3 FB Neck ROM: Full    Dental no notable dental hx. (+) Teeth Intact, Dental Advisory Given   Pulmonary neg pulmonary ROS,    Pulmonary exam normal breath sounds clear to auscultation       Cardiovascular negative cardio ROS Normal cardiovascular exam Rhythm:Regular Rate:Normal     Neuro/Psych PSYCHIATRIC DISORDERS Anxiety Parkinson's Disease.    GI/Hepatic negative GI ROS, Neg liver ROS,   Endo/Other  negative endocrine ROS  Renal/GU negative Renal ROS  negative genitourinary   Musculoskeletal negative musculoskeletal ROS (+)   Abdominal   Peds negative pediatric ROS (+)  Hematology negative hematology ROS (+)   Anesthesia Other Findings   Reproductive/Obstetrics negative OB ROS                            Anesthesia Physical Anesthesia Plan  ASA: II  Anesthesia Plan: General   Post-op Pain Management:    Induction: Intravenous  Airway Management Planned: Oral ETT  Additional Equipment:   Intra-op Plan:   Post-operative Plan: Extubation in OR  Informed Consent: I have reviewed the patients History and Physical, chart, labs and discussed the procedure including the risks, benefits and alternatives for the proposed anesthesia with the patient or authorized representative who has indicated his/her understanding and acceptance.   Dental advisory given  Plan Discussed with: CRNA  Anesthesia Plan Comments: (Discussed r/b of general and spinal anesthesia. Ms. Scheuring is interested in spinal anesthesia. I discussed with her that the procedures are scheduled for 4 hours and 15 minutes and that a spinal will not last long enough and that the probable outcome would  be that she would receive spinal and general anesthesia. She consents to general. )       Anesthesia Quick Evaluation

## 2016-01-11 ENCOUNTER — Ambulatory Visit (HOSPITAL_COMMUNITY): Payer: Medicare Other

## 2016-01-11 ENCOUNTER — Ambulatory Visit (HOSPITAL_BASED_OUTPATIENT_CLINIC_OR_DEPARTMENT_OTHER): Payer: Medicare Other | Admitting: Anesthesiology

## 2016-01-11 ENCOUNTER — Inpatient Hospital Stay (HOSPITAL_BASED_OUTPATIENT_CLINIC_OR_DEPARTMENT_OTHER)
Admission: RE | Admit: 2016-01-11 | Discharge: 2016-01-13 | DRG: 743 | Disposition: A | Discharge status: Home / Self Care | Payer: Medicare Other | Source: Ambulatory Visit | Attending: Urology | Admitting: Urology

## 2016-01-11 ENCOUNTER — Encounter (HOSPITAL_COMMUNITY)
Admission: RE | Disposition: A | Discharge status: Home / Self Care | Payer: Self-pay | Source: Ambulatory Visit | Attending: Urology

## 2016-01-11 ENCOUNTER — Encounter (HOSPITAL_BASED_OUTPATIENT_CLINIC_OR_DEPARTMENT_OTHER): Payer: Self-pay | Admitting: *Deleted

## 2016-01-11 DIAGNOSIS — N8111 Cystocele, midline: Secondary | ICD-10-CM | POA: Diagnosis not present

## 2016-01-11 DIAGNOSIS — N393 Stress incontinence (female) (male): Secondary | ICD-10-CM | POA: Diagnosis present

## 2016-01-11 DIAGNOSIS — D259 Leiomyoma of uterus, unspecified: Secondary | ICD-10-CM | POA: Diagnosis not present

## 2016-01-11 DIAGNOSIS — F329 Major depressive disorder, single episode, unspecified: Secondary | ICD-10-CM | POA: Diagnosis present

## 2016-01-11 DIAGNOSIS — F419 Anxiety disorder, unspecified: Secondary | ICD-10-CM | POA: Diagnosis present

## 2016-01-11 DIAGNOSIS — K5909 Other constipation: Secondary | ICD-10-CM | POA: Diagnosis present

## 2016-01-11 DIAGNOSIS — Z87891 Personal history of nicotine dependence: Secondary | ICD-10-CM

## 2016-01-11 DIAGNOSIS — G20 Parkinson's disease: Secondary | ICD-10-CM | POA: Diagnosis not present

## 2016-01-11 DIAGNOSIS — Z880 Allergy status to penicillin: Secondary | ICD-10-CM

## 2016-01-11 DIAGNOSIS — N819 Female genital prolapse, unspecified: Secondary | ICD-10-CM | POA: Diagnosis present

## 2016-01-11 DIAGNOSIS — N813 Complete uterovaginal prolapse: Secondary | ICD-10-CM | POA: Diagnosis not present

## 2016-01-11 DIAGNOSIS — N3641 Hypermobility of urethra: Secondary | ICD-10-CM | POA: Diagnosis not present

## 2016-01-11 DIAGNOSIS — N8110 Cystocele, unspecified: Secondary | ICD-10-CM | POA: Diagnosis not present

## 2016-01-11 DIAGNOSIS — R109 Unspecified abdominal pain: Secondary | ICD-10-CM

## 2016-01-11 HISTORY — DX: Cystocele, unspecified: N81.10

## 2016-01-11 HISTORY — DX: Generalized anxiety disorder: F41.1

## 2016-01-11 HISTORY — DX: Presence of spectacles and contact lenses: Z97.3

## 2016-01-11 HISTORY — PX: VAGINAL HYSTERECTOMY: SHX2639

## 2016-01-11 HISTORY — DX: Frequency of micturition: R35.0

## 2016-01-11 HISTORY — DX: Urgency of urination: R39.15

## 2016-01-11 HISTORY — PX: CYSTOSCOPY W/ URETERAL STENT PLACEMENT: SHX1429

## 2016-01-11 HISTORY — PX: RECTOCELE REPAIR: SHX761

## 2016-01-11 HISTORY — PX: CYSTOCELE REPAIR: SHX163

## 2016-01-11 HISTORY — PX: PUBOVAGINAL SLING: SHX1035

## 2016-01-11 LAB — HEMOGLOBIN AND HEMATOCRIT, BLOOD
HEMATOCRIT: 33.2 % — AB (ref 36.0–46.0)
HEMOGLOBIN: 11.3 g/dL — AB (ref 12.0–15.0)

## 2016-01-11 LAB — TYPE AND SCREEN
ABO/RH(D): A POS
Antibody Screen: NEGATIVE

## 2016-01-11 LAB — POCT I-STAT, CHEM 8
BUN: 19 mg/dL (ref 6–20)
Calcium, Ion: 1.15 mmol/L (ref 1.13–1.30)
Chloride: 104 mmol/L (ref 101–111)
Creatinine, Ser: 0.8 mg/dL (ref 0.44–1.00)
Glucose, Bld: 98 mg/dL (ref 65–99)
HEMATOCRIT: 41 % (ref 36.0–46.0)
HEMOGLOBIN: 13.9 g/dL (ref 12.0–15.0)
Potassium: 3.7 mmol/L (ref 3.5–5.1)
SODIUM: 141 mmol/L (ref 135–145)
TCO2: 25 mmol/L (ref 0–100)

## 2016-01-11 LAB — ABO/RH: ABO/RH(D): A POS

## 2016-01-11 SURGERY — CYSTOSCOPY, WITH RETROGRADE PYELOGRAM AND URETERAL STENT INSERTION
Anesthesia: General | Site: Vagina

## 2016-01-11 MED ORDER — ACETAMINOPHEN 325 MG PO TABS: 650.0000 mg | ORAL_TABLET | ORAL | Status: DC | PRN

## 2016-01-11 MED ORDER — PROMETHAZINE HCL 25 MG/ML IJ SOLN
INTRAMUSCULAR | Status: AC
  Filled 2016-01-11: qty 1

## 2016-01-11 MED ORDER — PROPOFOL 10 MG/ML IV BOLUS
INTRAVENOUS | Status: DC | PRN
  Administered 2016-01-11: 150 mg via INTRAVENOUS

## 2016-01-11 MED ORDER — PROPOFOL 10 MG/ML IV BOLUS
INTRAVENOUS | Status: AC
  Filled 2016-01-11: qty 40

## 2016-01-11 MED ORDER — DEXAMETHASONE SODIUM PHOSPHATE 4 MG/ML IJ SOLN
INTRAMUSCULAR | Status: DC | PRN
  Administered 2016-01-11: 10 mg via INTRAVENOUS

## 2016-01-11 MED ORDER — NEOSTIGMINE METHYLSULFATE 10 MG/10ML IV SOLN
INTRAVENOUS | Status: DC | PRN
  Administered 2016-01-11: 2 mg via INTRAVENOUS

## 2016-01-11 MED ORDER — CIPROFLOXACIN IN D5W 400 MG/200ML IV SOLN
INTRAVENOUS | Status: AC
Start: 2016-01-11 — End: 2016-01-11
  Filled 2016-01-11: qty 200

## 2016-01-11 MED ORDER — IOPAMIDOL (ISOVUE-370) INJECTION 76%
INTRAVENOUS | Status: DC | PRN
  Administered 2016-01-11: 10 mL

## 2016-01-11 MED ORDER — KETOROLAC TROMETHAMINE 15 MG/ML IJ SOLN: 15.0000 mg | Freq: Four times a day (QID) | INTRAMUSCULAR | Status: DC

## 2016-01-11 MED ORDER — SODIUM CHLORIDE 0.9 % IR SOLN
Status: DC | PRN
  Administered 2016-01-11: 500 mL

## 2016-01-11 MED ORDER — HYDROCODONE-ACETAMINOPHEN 5-325 MG PO TABS
1.0000 | ORAL_TABLET | ORAL | Status: DC | PRN
  Administered 2016-01-12 – 2016-01-13 (×5): 2 via ORAL
  Filled 2016-01-11 (×6): qty 2

## 2016-01-11 MED ORDER — GLYCOPYRROLATE 0.2 MG/ML IJ SOLN
INTRAMUSCULAR | Status: AC
  Filled 2016-01-11: qty 3

## 2016-01-11 MED ORDER — LIDOCAINE HCL (CARDIAC) 20 MG/ML IV SOLN
INTRAVENOUS | Status: DC | PRN
  Administered 2016-01-11: 70 mg via INTRAVENOUS

## 2016-01-11 MED ORDER — LACTATED RINGERS IV SOLN
INTRAVENOUS | Status: DC
  Administered 2016-01-11 (×2): via INTRAVENOUS
  Filled 2016-01-11: qty 1000

## 2016-01-11 MED ORDER — LACTATED RINGERS IV SOLN
INTRAVENOUS | Status: DC
  Administered 2016-01-11 (×2): via INTRAVENOUS
  Filled 2016-01-11: qty 1000

## 2016-01-11 MED ORDER — SODIUM CHLORIDE 0.9 % IJ SOLN
INTRAMUSCULAR | Status: DC | PRN
  Administered 2016-01-11: 50 mL

## 2016-01-11 MED ORDER — MORPHINE SULFATE (PF) 2 MG/ML IV SOLN
2.0000 mg | INTRAVENOUS | Status: DC | PRN
  Administered 2016-01-11 – 2016-01-12 (×3): 2 mg via INTRAVENOUS
  Filled 2016-01-11 (×3): qty 1

## 2016-01-11 MED ORDER — CIPROFLOXACIN IN D5W 400 MG/200ML IV SOLN
400.0000 mg | INTRAVENOUS | Status: AC
  Administered 2016-01-11: 400 mg via INTRAVENOUS
  Filled 2016-01-11: qty 200

## 2016-01-11 MED ORDER — PRAMIPEXOLE DIHYDROCHLORIDE 0.25 MG PO TABS
0.7500 mg | ORAL_TABLET | Freq: Three times a day (TID) | ORAL | Status: DC
  Administered 2016-01-12 – 2016-01-13 (×4): 0.75 mg via ORAL
  Filled 2016-01-11 (×8): qty 3

## 2016-01-11 MED ORDER — ROCURONIUM BROMIDE 100 MG/10ML IV SOLN
INTRAVENOUS | Status: DC | PRN
  Administered 2016-01-11: 35 mg via INTRAVENOUS

## 2016-01-11 MED ORDER — STERILE WATER FOR IRRIGATION IR SOLN
Status: DC | PRN
  Administered 2016-01-11: 3000 mL via INTRAVESICAL

## 2016-01-11 MED ORDER — BUPIVACAINE-EPINEPHRINE 0.5% -1:200000 IJ SOLN
INTRAMUSCULAR | Status: DC | PRN
  Administered 2016-01-11: 30 mL

## 2016-01-11 MED ORDER — DEXTROSE-NACL 5-0.45 % IV SOLN
INTRAVENOUS | Status: DC
  Administered 2016-01-11 – 2016-01-12 (×2): via INTRAVENOUS
  Administered 2016-01-12: 75 mL/h via INTRAVENOUS

## 2016-01-11 MED ORDER — LIDOCAINE HCL 4 % EX SOLN
CUTANEOUS | Status: DC | PRN
  Administered 2016-01-11: 2.5 mL via TOPICAL

## 2016-01-11 MED ORDER — DOCUSATE SODIUM 100 MG PO CAPS
100.0000 mg | ORAL_CAPSULE | Freq: Two times a day (BID) | ORAL | Status: DC
  Administered 2016-01-12 – 2016-01-13 (×3): 100 mg via ORAL
  Filled 2016-01-11 (×5): qty 1

## 2016-01-11 MED ORDER — GENTAMICIN SULFATE 40 MG/ML IJ SOLN
290.0000 mg | INTRAVENOUS | Status: AC
  Administered 2016-01-11: 290 mg via INTRAVENOUS
  Filled 2016-01-11 (×2): qty 7.25

## 2016-01-11 MED ORDER — CLINDAMYCIN PHOSPHATE 900 MG/50ML IV SOLN
900.0000 mg | INTRAVENOUS | Status: AC
  Administered 2016-01-11: 900 mg via INTRAVENOUS
  Filled 2016-01-11 (×2): qty 50

## 2016-01-11 MED ORDER — EPHEDRINE SULFATE 50 MG/ML IJ SOLN
INTRAMUSCULAR | Status: DC | PRN
  Administered 2016-01-11: 10 mg via INTRAVENOUS

## 2016-01-11 MED ORDER — ONDANSETRON HCL 4 MG/2ML IJ SOLN
4.0000 mg | INTRAMUSCULAR | Status: DC | PRN
  Administered 2016-01-11 (×2): 4 mg via INTRAVENOUS
  Filled 2016-01-11 (×2): qty 2

## 2016-01-11 MED ORDER — ONDANSETRON HCL 4 MG/2ML IJ SOLN
INTRAMUSCULAR | Status: AC
  Filled 2016-01-11: qty 2

## 2016-01-11 MED ORDER — ESTRADIOL 0.1 MG/GM VA CREA
TOPICAL_CREAM | VAGINAL | Status: DC | PRN
  Administered 2016-01-11: 1 via VAGINAL

## 2016-01-11 MED ORDER — PROMETHAZINE HCL 25 MG/ML IJ SOLN
6.2500 mg | INTRAMUSCULAR | Status: DC | PRN
  Administered 2016-01-11: 6.25 mg via INTRAVENOUS
  Filled 2016-01-11: qty 1

## 2016-01-11 MED ORDER — DEXAMETHASONE SODIUM PHOSPHATE 10 MG/ML IJ SOLN
INTRAMUSCULAR | Status: AC
  Filled 2016-01-11: qty 1

## 2016-01-11 MED ORDER — GENTAMICIN SULFATE 40 MG/ML IJ SOLN: INTRAVENOUS | Status: DC

## 2016-01-11 MED ORDER — ROCURONIUM BROMIDE 100 MG/10ML IV SOLN
INTRAVENOUS | Status: AC
Start: 2016-01-11 — End: 2016-01-11
  Filled 2016-01-11: qty 1

## 2016-01-11 MED ORDER — FENTANYL CITRATE (PF) 250 MCG/5ML IJ SOLN
INTRAMUSCULAR | Status: AC
  Filled 2016-01-11: qty 5

## 2016-01-11 MED ORDER — MIDAZOLAM HCL 2 MG/2ML IJ SOLN
INTRAMUSCULAR | Status: AC
  Filled 2016-01-11: qty 4

## 2016-01-11 MED ORDER — MIDAZOLAM HCL 5 MG/5ML IJ SOLN
INTRAMUSCULAR | Status: DC | PRN
  Administered 2016-01-11: 0.5 mg via INTRAVENOUS

## 2016-01-11 MED ORDER — FLUORESCEIN SODIUM 10 % IJ SOLN
INTRAMUSCULAR | Status: AC
  Filled 2016-01-11: qty 5

## 2016-01-11 MED ORDER — NEOSTIGMINE METHYLSULFATE 10 MG/10ML IV SOLN
INTRAVENOUS | Status: AC
  Filled 2016-01-11: qty 1

## 2016-01-11 MED ORDER — FENTANYL CITRATE (PF) 100 MCG/2ML IJ SOLN
INTRAMUSCULAR | Status: DC | PRN
  Administered 2016-01-11: 25 ug via INTRAVENOUS
  Administered 2016-01-11 (×2): 50 ug via INTRAVENOUS
  Administered 2016-01-11: 100 ug via INTRAVENOUS

## 2016-01-11 MED ORDER — ONDANSETRON HCL 4 MG/2ML IJ SOLN
INTRAMUSCULAR | Status: DC | PRN
  Administered 2016-01-11: 4 mg via INTRAVENOUS

## 2016-01-11 MED ORDER — HYDROMORPHONE HCL 1 MG/ML IJ SOLN
0.2500 mg | INTRAMUSCULAR | Status: DC | PRN
  Filled 2016-01-11: qty 1

## 2016-01-11 MED ORDER — SENNA 8.6 MG PO TABS
1.0000 | ORAL_TABLET | Freq: Two times a day (BID) | ORAL | Status: DC
  Administered 2016-01-12 – 2016-01-13 (×3): 8.6 mg via ORAL
  Filled 2016-01-11 (×3): qty 1

## 2016-01-11 MED ORDER — KETOROLAC TROMETHAMINE 15 MG/ML IJ SOLN
15.0000 mg | Freq: Four times a day (QID) | INTRAMUSCULAR | Status: DC
  Administered 2016-01-11 – 2016-01-12 (×2): 15 mg via INTRAVENOUS
  Filled 2016-01-11 (×6): qty 1

## 2016-01-11 SURGICAL SUPPLY — 75 items
ADAPTER GOLDBERG URETERAL (ADAPTER) ×2 IMPLANT
ADPR CATH 15X14FR FL DRN BG (ADAPTER) ×3
BAG URINE DRAINAGE (UROLOGICAL SUPPLIES) ×8 IMPLANT
BLADE CLIPPER SURG (BLADE) ×8 IMPLANT
BLADE SURG 10 STRL SS (BLADE) ×5 IMPLANT
BLADE SURG 15 STRL LF DISP TIS (BLADE) ×3 IMPLANT
BLADE SURG 15 STRL SS (BLADE) ×10
CANISTER SUCTION 2500CC (MISCELLANEOUS) ×10 IMPLANT
CATH FOLEY 2WAY SLVR  5CC 16FR (CATHETERS) ×2
CATH FOLEY 2WAY SLVR 5CC 16FR (CATHETERS) ×3 IMPLANT
CATH INTERMIT  6FR 70CM (CATHETERS) ×4 IMPLANT
COVER BACK TABLE 60X90IN (DRAPES) ×11 IMPLANT
COVER MAYO STAND STRL (DRAPES) ×5 IMPLANT
DEVICE CAPIO SLIM BOX (INSTRUMENTS) IMPLANT
DEVICE CAPIO SLIM SINGLE (INSTRUMENTS) ×2 IMPLANT
DEVICE CAPIO SUTURING (INSTRUMENTS)
DEVICE CAPIO SUTURING OPC (INSTRUMENTS) IMPLANT
DRAPE LG THREE QUARTER DISP (DRAPES) ×5 IMPLANT
DRAPE UNDERBUTTOCKS STRL (DRAPE) ×8 IMPLANT
ELECT REM PT RETURN 9FT ADLT (ELECTROSURGICAL) ×5
ELECTRODE REM PT RTRN 9FT ADLT (ELECTROSURGICAL) ×3 IMPLANT
FLOSEAL 10ML (HEMOSTASIS) IMPLANT
GAUZE SPONGE 4X4 16PLY XRAY LF (GAUZE/BANDAGES/DRESSINGS) ×2 IMPLANT
GLOVE BIO SURGEON STRL SZ 6.5 (GLOVE) ×15 IMPLANT
GLOVE BIO SURGEON STRL SZ7 (GLOVE) ×5 IMPLANT
GLOVE BIO SURGEON STRL SZ7.5 (GLOVE) ×9 IMPLANT
GLOVE BIO SURGEONS STRL SZ 6.5 (GLOVE) ×6
GOWN STRL REUS W/ TWL LRG LVL3 (GOWN DISPOSABLE) ×6 IMPLANT
GOWN STRL REUS W/ TWL XL LVL3 (GOWN DISPOSABLE) ×3 IMPLANT
GOWN STRL REUS W/TWL LRG LVL3 (GOWN DISPOSABLE) ×20
GOWN STRL REUS W/TWL XL LVL3 (GOWN DISPOSABLE) ×15
GUIDEWIRE STR DUAL SENSOR (WIRE) ×2 IMPLANT
HOLDER FOLEY CATH W/STRAP (MISCELLANEOUS) ×5 IMPLANT
KIT ROOM TURNOVER WOR (KITS) ×8 IMPLANT
LIQUID BAND (GAUZE/BANDAGES/DRESSINGS) IMPLANT
NDL 1/2 CIR CATGUT .05X1.09 (NEEDLE) IMPLANT
NDL SPNL 22GX3.5 QUINCKE BK (NEEDLE) ×3 IMPLANT
NEEDLE 1/2 CIR CATGUT .05X1.09 (NEEDLE) ×5 IMPLANT
NEEDLE HYPO 22GX1.5 SAFETY (NEEDLE) ×8 IMPLANT
NEEDLE SPNL 22GX3.5 QUINCKE BK (NEEDLE) ×5 IMPLANT
NS IRRIG 500ML POUR BTL (IV SOLUTION) ×5 IMPLANT
PACK BASIN DAY SURGERY FS (CUSTOM PROCEDURE TRAY) ×5 IMPLANT
PACKING VAGINAL (PACKING) ×5 IMPLANT
PAD OB MATERNITY 4.3X12.25 (PERSONAL CARE ITEMS) ×5 IMPLANT
PAD PREP 24X48 CUFFED NSTRL (MISCELLANEOUS) ×5 IMPLANT
PENCIL BUTTON HOLSTER BLD 10FT (ELECTRODE) ×8 IMPLANT
PLUG CATH AND CAP STER (CATHETERS) ×5 IMPLANT
RETRACTOR LONRSTAR 16.6X16.6CM (MISCELLANEOUS) ×3 IMPLANT
RETRACTOR STAY HOOK 5MM (MISCELLANEOUS) ×5 IMPLANT
RETRACTOR STER APS 16.6X16.6CM (MISCELLANEOUS) ×5
SET IRRIG Y TYPE TUR BLADDER L (SET/KITS/TRAYS/PACK) ×5 IMPLANT
SHEET LAVH (DRAPES) ×8 IMPLANT
SLING ALTIS SYSTEM (Sling) ×2 IMPLANT
SLING SOLYX SYSTEM SIS BX (SLING) IMPLANT
SPONGE LAP 4X18 X RAY DECT (DISPOSABLE) ×19 IMPLANT
SUCTION FRAZIER HANDLE 10FR (MISCELLANEOUS) ×2
SUCTION TUBE FRAZIER 10FR DISP (MISCELLANEOUS) ×3 IMPLANT
SUT CAPIO PGA 48IN SZ 0 (SUTURE) ×4 IMPLANT
SUT VIC AB 0 CT1 18XCR BRD8 (SUTURE) IMPLANT
SUT VIC AB 0 CT1 36 (SUTURE) ×8 IMPLANT
SUT VIC AB 0 CT1 8-18 (SUTURE) ×15
SUT VIC AB 2-0 UR6 27 (SUTURE) ×16 IMPLANT
SUT VICRYL 0 TIES 12 18 (SUTURE) ×2 IMPLANT
SUT VICRYL 0 UR6 27IN ABS (SUTURE) ×2 IMPLANT
SYR BULB IRRIGATION 50ML (SYRINGE) ×8 IMPLANT
SYR CONTROL 10ML LL (SYRINGE) ×11 IMPLANT
SYRINGE 10CC LL (SYRINGE) ×8 IMPLANT
TISSUE AXIS TUTOPLAST 8CMX12CM (Tissue) ×2 IMPLANT
TOWEL OR 17X24 6PK STRL BLUE (TOWEL DISPOSABLE) ×10 IMPLANT
TRAY DSU PREP LF (CUSTOM PROCEDURE TRAY) ×10 IMPLANT
TUBE CONNECTING 12'X1/4 (SUCTIONS) ×2
TUBE CONNECTING 12X1/4 (SUCTIONS) ×14 IMPLANT
WATER STERILE IRR 3000ML UROMA (IV SOLUTION) ×2 IMPLANT
WATER STERILE IRR 500ML POUR (IV SOLUTION) ×13 IMPLANT
YANKAUER SUCT BULB TIP NO VENT (SUCTIONS) ×5 IMPLANT

## 2016-01-11 NOTE — Op Note (Signed)
Post-operative Diagnosis: Cystocele and stress incontinence   Procedure and Anesthesia:  Procedure(s) and Anesthesia Type: Panel 1:    * CYSTOSCOPY WITH BILATERAL RETROGRADE PYELOGRAM/URETERAL STENT PLACEMENT - General    * ANTERIOR REPAIR (CYSTOCELE) COLOPLAST FASCIA  SACROSPINOUS FIXATION - General    *  ALTIS SLING SINGLE INCISION - General  Panel 2:    * VAGINAL HYSTERECTOMY  - General    * POSTERIOR REPAIR (RECTOCELE) - General  Surgeon: Surgeon(s) and Role: Panel 1:    * Carolan Clines, MD - Primary    * Carolan Clines, MD - Primary  Panel 2:    * Linda Hedges, DO    * Dian Queen, MD - Primary    * Dian Queen, MD - Primary   Resident:  Christell Faith  EBL: 150 ml  IVF: See anesthesia record  Drains: Foley catheter  Implants:   Implant Name Type Inv. Item Serial No. Manufacturer Lot No. LRB No. Used  TISSUE AXIS TUTOPLAST [id number] - [id number] Tissue TISSUE AXIS TUTOPLAST [id number] COLOPLAST [id number] N/A 1  SLING ALTIS SYSTEM - [id number] Sling SLING ALTIS SYSTEM   COLOPLAST [id number] N/A 1    Specimens: @ORSPECIMEN @  Complications: * No complications entered in OR log *  Indications for Surgery: 70 y.o. female with a cystocele and stress incontinence. Risks, benefits, and alternatives of the above procedure were discussed previously in detail and informed consents was signed and verified.  Findings: Large grade 4 cystocele with stress incontinence demonstrated with a full bladder. Successful repair of the cystocele and placement of sling. There was some extra residual mucosa that was left in place but the bladder was well supported.   Procedure Details: The patient and consent was verified in the pre-op holding area and brought to the operating room where they were placed on the operating table. Pre-induction time out was called and general anesthesia was induced. SCDs were placed and IV antibiotics were started.   We were careful to get back  to the apex and used an Allis clamp technique and made a T-shaped anterior vaginal wall incision and sharply dissected the overlying vaginal wall mucosa from the underlying pubocervical fascia to the white line bilaterally. I was pleased with my apical mobilization.  With careful retraction and giving as much length as possible anteriorly a kelly plication anterior repair was performed with running 2-0 Vicryl not imbricating the bladder neck. It very much reduced the cystocele.   We carefully bluntly dissected down to the ischial spine bilaterally. We sweeped tissue medially. Using a Capio device I placed a 0 Ethibond on the sacrospinous ligament 1 full finger breath medial to the spine in a straight line between the spines. I triple checked the position.  We trimmed a 12 x 6 graft. It was well-prepared. It was tailored in the shape of the defect. It was sewn in place anatomically with the Ethibond sutures. 0 Vicryl was used to sutured to the pubocervical fascia distally tension-free.  I took down my double-ring retractor and reduced her apex. The graft was pulled over her apex. I did tacking sutures from the graft in the midline cephalad and just to the edge of the anterior vaginal wall incision. I trimmed an appropriate amount of anterior vaginal wall mucosa and closed the anterior vaginal wall with running 2-0 Vicryl and UR-6 needle.  Foley catheter was in placed. The area of the bladder neck is marked. The mid urethra is marked. Injection was then accomplished with Marcaine 0.5%  with epinephrine 1 200,000 in the suprapubic area, and the mid urethral area.  The vaginal incision over the mid urethra is made with a 15 blade. Subcutaneous tissue is dissected, bilaterally. Dissection is carried out towards the pelvic sidewall towards the obturator membrane just inferior to the insertion point of the adductor longus tendon.   The Sling is secured to the passing trocar in standard fashion  The Irrigation  of the vagina was accomplished, and the sling is smooth against the urethra, but not tight. Minimal bleeding is noted. The vaginal incision is closed with a single running 2-0 Vicryl suture.   The patient was cystoscoped. She had excellent efflux bilaterally. Cystoscopically she had a good repair. There is no distortion of the ureters.  At the end of our portion the patient had excellent length and very good support anteriorly and posteriorly.

## 2016-01-11 NOTE — Transfer of Care (Signed)
Immediate Anesthesia Transfer of Care Note  Patient: Amanda [name]  Procedure(s) Performed: Procedure(s): CYSTOSCOPY WITH BILATERAL RETROGRADE PYELOGRAM/URETERAL STENT PLACEMENT (Bilateral) ANTERIOR REPAIR (CYSTOCELE) COLOPLAST FASCIA  SACROSPINOUS FIXATION (N/A)  ALTIS SLING SINGLE INCISION (N/A) TOTAL VAGINAL HYSTERECTOMY  (N/A) POSTERIOR REPAIR (RECTOCELE), PERINEAPLASTY (N/A)  Patient Location: PACU  Anesthesia Type:General  Level of Consciousness: awake, alert , oriented and patient cooperative  Airway & Oxygen Therapy: Patient Spontanous Breathing and Patient connected to nasal cannula oxygen  Post-op Assessment: Report given to RN and Post -op Vital signs reviewed and stable  Post vital signs: Reviewed and stable  Last Vitals:  Filed Vitals:   01/11/16 0629  BP: 122/54  Pulse: 62  Temp: 36.7 C  Resp: 16    Complications: No apparent anesthesia complications

## 2016-01-11 NOTE — Brief Op Note (Signed)
01/11/2016  1:06 PM  PATIENT:  Amanda [name]  70 y.o. 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   POST-OPERATIVE DIAGNOSIS:  PROLAPSE OF VAGINAL WALL WITH MIDLINE CYSTOCELE TOTAL UTERINE PROCIDENTIA   PROCEDURE:  Procedure(s): CYSTOSCOPY WITH BILATERAL RETROGRADE PYELOGRAM/URETERAL STENT PLACEMENT (Bilateral) ANTERIOR REPAIR (CYSTOCELE) COLOPLAST FASCIA  SACROSPINOUS FIXATION (N/A)  ALTIS SLING SINGLE INCISION (N/A) TOTAL VAGINAL HYSTERECTOMY  (N/A) POSTERIOR REPAIR (RECTOCELE), PERINEAPLASTY (N/A)  SURGEON:  Surgeon(s) and Role: Panel 1:    * Carolan Clines, MD - Primary    * Carolan Clines, MD - Primary  Panel 2:    * Linda Hedges, DO    * Dian Queen, MD - Primary    * Dian Queen, MD - Primary  PHYSICIAN ASSISTANT:   ASSISTANTS: none   ANESTHESIA:   general  EBL:  Total I/O In: 1450 [I.V.:1450] Out: 525 [Urine:300; Blood:225]  BLOOD ADMINISTERED:none  DRAINS: Urinary Catheter (Foley) and vaginal packing   LOCAL MEDICATIONS USED:  NONE  SPECIMEN:  Source of Specimen:  uterus and cervix  DISPOSITION OF SPECIMEN:  PATHOLOGY  COUNTS:  YES  TOURNIQUET:  * No tourniquets in log *  DICTATION: .Other Dictation: Dictation Number [id number]  PLAN OF CARE: Other Dictation: Dictation Number [id number]  PATIENT DISPOSITION:  PACU - hemodynamically stable.   Delay start of Pharmacological VTE agent (>24hrs) due to surgical blood loss or risk of bleeding: not applicable

## 2016-01-11 NOTE — Anesthesia Procedure Notes (Signed)
Procedure Name: Intubation Date/Time: 01/11/2016 7:46 AM Performed by: Wanita Chamberlain Pre-anesthesia Checklist: Patient identified, Timeout performed, Emergency Drugs available, Suction available and Patient being monitored Patient Re-evaluated:Patient Re-evaluated prior to inductionOxygen Delivery Method: Circle system utilized Preoxygenation: Pre-oxygenation with 100% oxygen Intubation Type: IV induction Ventilation: Mask ventilation without difficulty Laryngoscope Size: Mac and 3 Grade View: Grade II Tube type: Oral Tube size: 7.0 mm Number of attempts: 1 Airway Equipment and Method: Stylet Placement Confirmation: ETT inserted through vocal cords under direct vision,  breath sounds checked- equal and bilateral and positive ETCO2 Secured at: 22 cm Tube secured with: Tape Dental Injury: Teeth and Oropharynx as per pre-operative assessment  Comments: Limited neck extension, Grade ll view w/ cricoid manipulation

## 2016-01-11 NOTE — Progress Notes (Signed)
Patient is without complaint.  H and P on the chart  Dr. Gaynelle Arabian to start with Cystoscopy and ureteral stent placement prior to Vaginal hysterectomy and possible BSO  Consent signed

## 2016-01-11 NOTE — Consult Note (Signed)
Reason For Visit F/u to discuss surgery   Active Problems Problems  1. Chronic constipation (K59.09) 2. Female stress incontinence (N39.3)   Assessed By: Carolan Clines (Urology); Last Assessed: 13 Nov 2015 3. Parkinson's disease (G20) 4. 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 Dr. Helane Rima states that she needs to have TVH and possible BSO. She is para 3-3-0, referred by Dr. Helane Rima for evaluation of pelvic floor prolapse. She has uterine prolapse, outside of the vaginal introitus, with a flattened cervix. The patient splints in order to void. She is being considered for combined GYN urologic surgery, total pelvic floor prolapse. The patient is currently on "bioidentical" hormones, which she tolerates. She notes urinary frequency, urgency, nocturia, difficulty initiating her urinary stream, denying cough, laugh, sneeze leakage, but having some urge incontinence. She has not been evaluated by PT.     In addition, the patient has a history of Parkinson's disease. She is now taking pramipeoxle 0.75mg  TID.  She has chronic constipation, untreated. She is better with diet control. She also has anxiety, and some depression, currently untreated.    Urodynamics on 12/23/14, showed a first sensation at 235 cc, normal desire at 324 cc and strong desire at 459 cc. There were no unstable contractions during the filling phase. The cough leak point pressure at 100 cc's 45cm of water, and cough leak point pressure at 200 cc was 42 cm of water, with leakage noted. Valsalva leak point pressure at 450 cc was 57 m water, with leakage noted with the vaginal packing in place. The pressure flow study showed a maximum flow rate of 17 cc/s, with detrusor pressure of 14 centers of water. The postvoid residual is 175 cc. Double void  post void residual is 50 cc.    The patient was felt to have stress urinary incontinence with and without reduction of her prolapse. She has no instability. She had initial elevation of her post void residual, but this normalized with double void technique. There was no reflux noted.   She will need pre-operative CT to evaluate ureters, followed by coincidental hysterectomy, anterior vault tissue -augmented repair with apical sacrospinous fixation; and Altis Single Incision Sling with intra-operative tensioning.    She is putting Estrogen/progesterone cream on her legs each day-and will put coconut oil on her everted bladder uterus.   Past Medical History Problems  1. History of Anxiety (F41.9) 2. History of depression (Z86.59)  Surgical History Problems  1. History of Excision Of Intra-Abdominal Mass  Current Meds 1. Biest/Progesterone CREA;  Therapy: (Recorded:01Mar2016) to Recorded 2. Calcium Acetate CAPS;  Therapy: (Recorded:01Mar2016) to Recorded 3. DHEA TABS;  Therapy: (Recorded:01Mar2016) to Recorded 4. Fish Oil CAPS;  Therapy: (Recorded:01Mar2016) to Recorded 5. Pramipexole Dihydrochloride 0.75 MG Oral Tablet;  Therapy: (Recorded:24Feb2017) to Recorded 6. Progesterone CAPS;  Therapy: (Recorded:01Mar2016) to Recorded 7. Vitamin B Complex CAPS;  Therapy: (Recorded:01Mar2016) to Recorded 8. Vitamin D SOLN;  Therapy: (Recorded:01Mar2016) to Recorded  Allergies Medication  1. Penicillins  Family History Problems  1. No pertinent family history : Mother  Social History Problems  1. Alcohol use (Z78.9) 2. Denied: History of Caffeine use 3. Father deceased 24. Former smoker [phone]) 5. Married 6. Mother deceased 26. Never a smoker 8. Number of children   1 son/ 2 daughters  Review of Systems Genitourinary, constitutional, skin, eye, otolaryngeal,  hematologic/lymphatic, cardiovascular, pulmonary, endocrine, musculoskeletal, gastrointestinal, neurological and  psychiatric system(s) were reviewed and pertinent findings if present are noted and are otherwise negative.  Genitourinary: urinary frequency, urinary urgency, nocturia and urinary hesitancy.  Gastrointestinal: constipation.  Constitutional: feeling tired (fatigue) and recent ~Ulb weight loss.  Integumentary: skin rash/lesion.  Musculoskeletal: back pain and joint pain.  Psychiatric: depression and anxiety.    Vitals Vital Signs [Data Includes: Last 1 Day]  Recorded: 24Feb2017 01:49PM  Blood Pressure: 107 / 64 Temperature: 97.9 F Heart Rate: 73  Physical Exam Constitutional: Well nourished and well developed . No acute distress.  ENT:. The ears and nose are normal in appearance.  Neck: The appearance of the neck is normal and no neck mass is present.  Pulmonary: No respiratory distress and normal respiratory rhythm and effort.  Cardiovascular: Heart rate and rhythm are normal . No peripheral edema.  Abdomen: The abdomen is flat and not distended. The abdomen is soft and nontender. No masses are palpated. The abdomen is normal to percussion. No CVA tenderness. Bowel sounds are normal. No hernias are palpable.  No inguinal hernia is present on the right.  No inguinal hernia is present on the left. No hepatosplenomegaly noted.  Genitourinary:  Chaperone Present: kim lewis.  Examination of the external genitalia shows normal female external genitalia, no vulvar atrophy, no vulvar mass, no lesions, no condyloma acuminatum and no labial adhesions. The urethra is normal in appearance, not tender, does not appear stenotic and no urethral caruncle. There is no urethral mass. Urethral hypermobility is present. There is no urethral discharge. No periurethral cyst is identified. There is no urethral prolapse. Vaginal exam demonstrates atrophy, the vaginal epithelium to be poorly estrogenized and uterine prolapse, but no abnormalities, no discharge, no tenderness and no mass. A cystocele is present with  a midline defect (grade 4 /4). At the entroitus. Cervix at the entritus. No enterocele is identified. No rectocele is identified. There is no evidence of a vesicovaginal fistula. The cervix is is without abnormalities and without masses. There is no cervical discharge. There is no cervical motion tenderness Flattened cervix. The uterus is without abnormalities, non tender, not enlarged and without masses. The adnexa are palpably normal and non tender . Without masses. The bladder is normal on palpation, non tender, not distended and without masses. The anus is normal on inspection. The perineum is normal on inspection. No perineal tenderness is present.  Lymphatics: The femoral and inguinal nodes are not enlarged or tender.  Skin: Normal skin turgor, no visible rash and no visible skin lesions.  Neuro/Psych:. Mood and affect are appropriate.    Assessment Assessed  1. 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 Parkinson's disease (G20) 4. Female stress incontinence (N39.3) 5. Chronic constipation (K59.09)  1. Pelvic Organ Prolapse: needs: vaginal hysterectomy, possible BSO                           anterior vault repair   2. Chronic constipation: holding stool and "waits until I have to go", with loss of control of gas,   Plan Chronic constipation, Parkinson's disease, Prolapse of vaginal wall with midline cystocele, Urethral hypermobility  1. PT/OT Referral Referral  Referral  Status: Hold For - Appointment,PreCert,Date of  Service,Physical Therapy  Requested for: 24Feb2017 Female stress incontinence, Prolapse of vaginal wall with midline cystocele, Urethral hypermobility  2. Follow-up Schedule Surgery Office  Follow-up  Status: Hold For -  Appointment   Requested for: [phone] Prolapse of vaginal wall with midline cystocele  3. CT-ABD/PELVIS W/O CONTRAST; Status:Hold For - Appointment,PreCert,Date of  Service,Print; Requested  for:24Feb2017;  Prolapse of vaginal wall with midline cystocele, Urethral hypermobility  4. Pelvic Exam; Status:Complete;   [id number]  1. Physical Therapy evaluation pre-op, for pelvic floor strength, and also for post void double void teaching.  2. Chronic constipation sheet.   3. CT with IV contrast to evaluate her ureters,  4. Will need cysto, stent placement at the time of surgery.   5. coconut oil in everted uterus-estrogen to uterus several weeks prior ti surgery.   Discussion/Summary cc: Dr. Dian Queen   cc: Dr. Augusto Gamble  cc: Dr. Erik O[facility]   cc: Dr. Hillery Hunter, [facility]     Amendment  Another long and complete discussion with Lillianne and her husband with regard to her pelvic organ prolapse. She has progressed to complete pelvic organ prolapse, and will need proposed vaginal hysterectomy. Because she is having vaginal surgery, I proposed that she continue with vaginal negative orifice repair, rather than have laparoscopic sacral colposuspension for repair of her bladder. We discussed the alternatives, believe that she will be happy with this repair. She does not want mesh for bladder repair at all. We discussed the use of Augmentin repair with Coloplast tissue, and that the tissue will last 6 months to one year before it degrades. We will allow her to have better vaginal repair. We will still plan to bring her bladder to her sacrospinous ligaments.    She will be preoperative physical therapy, as well as vaginal hormone therapy and Coker oil to try to improve the tissue quality. In addition she will need to have a urethral sling, and we have discussed mid urethral slings. We will use an Altis mid urethral sling, because it is the shortest segment of mesh material, which offers large holes for scar tissue were through, and offers intraoperative tensioning.    She will need cystoscopy and ureteral catheterization. Preoperatively, she will need  CT scan with IV contrast for evaluation of her ureters as noted above. Pt has seen [facility] in preparation for surgery. Possible use of  Fluorecin explained to pt.    Also note to Dr. Pearline Cables, for scheduling.1     1 Amended By: Carolan Clines; Nov 13 2015 8:05 PM EST  Signatures Electronically signed by : Carolan Clines, M.D.; Nov 13 2015  8:09PM EST

## 2016-01-11 NOTE — Progress Notes (Signed)
Urology Progress Note  Day of Surgery   Subjective: post op ck: s/p Vag Hys, Kelly plication with augmented anterior prolapse repair with apical suspension to the sacrospinus ligaments, Posterior pelvic floor repair, Altis single incision sling.  Awake, but sleepy. Post phenergan and Zofran for nausea.      No acute urologic events overnight. Ambulation:   negative Flatus:    negative Bowel movement  negative  Pain: complete resolution. +nausea.   Objective:  Blood pressure 115/69, pulse 70, temperature 98.3 F (36.8 C), temperature source Oral, resp. rate 18, height 5' 4.5" (1.638 m), weight 57.153 kg (126 lb), SpO2 100 %.  Physical Exam:  General:  No acute distress, awake Extremities: extremities normal, atraumatic, no cyanosis or edema Genitourinary:  Normal GU Foley: urine dark.       Recent Labs     01/11/16  0655  01/11/16  1358  HGB  13.9  11.3*    Recent Labs     01/11/16  0655  NA  141  K  3.7  CL  104  BUN  19  CREATININE  0.80      Assessment/Plan:  Catheter not removed. Continue any current medications. May receive 2nd dose of zofran.

## 2016-01-11 NOTE — Anesthesia Postprocedure Evaluation (Signed)
Anesthesia Post Note  Patient: Amanda [name]  Procedure(s) Performed: Procedure(s) (LRB): CYSTOSCOPY WITH BILATERAL RETROGRADE PYELOGRAM/URETERAL STENT PLACEMENT (Bilateral) ANTERIOR REPAIR (CYSTOCELE) COLOPLAST FASCIA  SACROSPINOUS FIXATION (N/A)  ALTIS SLING SINGLE INCISION (N/A) TOTAL VAGINAL HYSTERECTOMY  (N/A) POSTERIOR REPAIR (RECTOCELE), PERINEAPLASTY (N/A)  Patient location during evaluation: PACU Anesthesia Type: General Level of consciousness: awake and alert Pain management: pain level controlled Vital Signs Assessment: post-procedure vital signs reviewed and stable Respiratory status: spontaneous breathing, nonlabored ventilation, respiratory function stable and patient connected to nasal cannula oxygen Cardiovascular status: blood pressure returned to baseline and stable Postop Assessment: no signs of nausea or vomiting Anesthetic complications: no    Last Vitals:  Filed Vitals:   01/11/16 1442 01/11/16 1551  BP: 118/54 136/66  Pulse: 63 61  Temp: 36.5 C 36.7 C  Resp: 17 18    Last Pain:  Filed Vitals:   01/11/16 1551  PainSc: Asleep                 Carriann Hesse J

## 2016-01-11 NOTE — Op Note (Signed)
Amanda, [name]               ACCOUNT NO.:  [id number]  MEDICAL RECORD NO.:  [id number]  LOCATION:                                 FACILITY:  PHYSICIAN:  Minier Helane Rima, M.D.    DATE OF BIRTH:  DATE OF PROCEDURE:  01/11/2016 DATE OF DISCHARGE:                              OPERATIVE REPORT   PREOPERATIVE DIAGNOSIS:  Total uterine procidentia.  POSTOPERATIVE DIAGNOSIS:  Total uterine procidentia.  PROCEDURE:  Total vaginal hysterectomy, posterior repair, and perineoplasty.  SURGEON:  Minerva Bluett L. Helane Rima, MD.  ASSISTANT:  Dr. Lynnette Caffey.  EBL:  250 mL.  COMPLICATIONS:  None.  DRAINS:  Foley catheter and vaginal packing with Estrace cream.  PATHOLOGY:  Uterus and cervix.  PROCEDURE IN DETAIL:  The patient was taken to the operating room.  She was then prepped and draped after she was intubated.  Dr. Gaynelle Arabian initially scrubbed in with his assistant and beginning portion of the procedure will be dictated by him and briefly, he performed cystoscopy with placement of bilateral ureteral stents.  Of note, exam under anesthesia prior to the procedure revealed total uterine procidentia. After he completed that, he then scrubbed out and I scrubbed in with Dr. Lynnette Caffey.  A time-out was performed.  The patient was already draped and her legs were in high lithotomy position.  Again, I saw total procidentia.  The cervix was grasped with a tenaculum, then we made a small circumferential incision around the external os and then used sharp and blunt dissection.  We dissected overlying vaginal tissue, which was thickened because of the procidentia anteriorly, posteriorly, and laterally.  The cervix was very elongated, was about 6 to 7 cm long, so we progressively used sharp and blunt dissection, which we dissected the vaginal epithelium away with careful attention to avoid any injury to the bladder.  We then identified the uterosacral-cardinal ligaments and we clamped those on either  side and each pedicles were suture ligated using 0 Vicryl suture.  We then able to enter posteriorly into the cul-de-sac and then the Deaver retractor was placed in the cul-de- sac.  We then continued to work on anteriorly with using Metzenbaum to dissect the bladder away from the anterior surface of the cervix and the uterus.  Once we did that, we applied curved Heaney clamps across the uterine artery.  Each pedicle was clamped, cut, and suture ligated using 0 Vicryl suture.  We were then able to retroflex the uterus, it was very small, and then I was able to create an avascular window and developed the bladder flap with careful attention to avoid the bladder.  We then clamped the remainder of the triple pedicle on either side using curved Heaney clamps.  Each pedicles clamped, cut, and suture ligated using 0 Vicryl suture.  The ovaries were very high in the pelvis.  They were normal and they were definitely not and we were not able to remove them vaginally, so I was not able to remove her ovaries.  At this point, we had left tags on the uterosacral ligaments on either side.  I then closed the posterior cuff in a running locked stitch.  Hemostasis was very good,  it was dry.  Dr. Gaynelle Arabian scrubbed in.  We had a brief discussion about his plans for surgery and my plan was to return after his portion was completed to do the posterior repair.  Myself and Dr. Lynnette Caffey scrubbed out.  Dr. Gaynelle Arabian then performed the antrum procedure.  The patient was very stable and that will be dictated by him.  He then called me back to the operating room at approximately 12:15 p.m., I then scrubbed back in and exam at that point revealed that she had a catheter in her bladder.  Her anterior repair was completed. She had good support of the anterior vault.  She had a large rectocele and some gapping at the perineum.  My plan was to perform a posterior repair with perineoplasty.  I made a V-shaped incision  with a scalpel and a midline incision was made all the way up to approximately 2 cm distal from the vaginal cuff using Metzenbaum scissors.  I carefully used sharp and blunt dissection and dissected the rectocele away from the overlying vaginal epithelium in standard fashion.  We then reduced the rectocele by a series of interrupted using 2-0 Vicryl suture and the rectocele was nicely reduced.  She had fair support of her vaginal cuff and it improved after we were able to elevate the rectocele.  The redundant vaginal epithelium was then trimmed.  I then placed 2 figure- of-eights in the perineum to reapproximate the perineal body and I gave her excellent reapproximation and narrowing of the introitus and supported the perineum.  I then closed an incision using 0 Vicryl running lock stitch starting distally and moving proximally.  At the end, hemostasis was very good.  She had excellent support and excellent reduction of the rectocele and she had no gapping of the introitus whatsoever.  Vaginal packing with Estrace cream was inserted to the vagina.  All sponge, lap, and instrument counts were correct x2.  At the end of the procedure, I did remove the 2 stents, but left the catheter in, which was draining.  The patient tolerated the procedure very well and she went to the recovery room, extubated in stable condition.     [name] L. Helane Rima, M.D.     Nevin Bloodgood  D:  01/11/2016  T:  01/11/2016  Job:  [id number]

## 2016-01-12 ENCOUNTER — Inpatient Hospital Stay (HOSPITAL_COMMUNITY): Payer: Medicare Other

## 2016-01-12 ENCOUNTER — Encounter (HOSPITAL_BASED_OUTPATIENT_CLINIC_OR_DEPARTMENT_OTHER): Payer: Self-pay | Admitting: Urology

## 2016-01-12 DIAGNOSIS — Z880 Allergy status to penicillin: Secondary | ICD-10-CM | POA: Diagnosis not present

## 2016-01-12 DIAGNOSIS — N393 Stress incontinence (female) (male): Secondary | ICD-10-CM | POA: Diagnosis present

## 2016-01-12 DIAGNOSIS — N1330 Unspecified hydronephrosis: Secondary | ICD-10-CM | POA: Diagnosis not present

## 2016-01-12 DIAGNOSIS — F419 Anxiety disorder, unspecified: Secondary | ICD-10-CM | POA: Diagnosis present

## 2016-01-12 DIAGNOSIS — N3641 Hypermobility of urethra: Secondary | ICD-10-CM | POA: Diagnosis present

## 2016-01-12 DIAGNOSIS — K5909 Other constipation: Secondary | ICD-10-CM | POA: Diagnosis present

## 2016-01-12 DIAGNOSIS — G20 Parkinson's disease: Secondary | ICD-10-CM | POA: Diagnosis present

## 2016-01-12 DIAGNOSIS — F329 Major depressive disorder, single episode, unspecified: Secondary | ICD-10-CM | POA: Diagnosis present

## 2016-01-12 DIAGNOSIS — Z87891 Personal history of nicotine dependence: Secondary | ICD-10-CM | POA: Diagnosis not present

## 2016-01-12 DIAGNOSIS — N813 Complete uterovaginal prolapse: Secondary | ICD-10-CM | POA: Diagnosis present

## 2016-01-12 LAB — BASIC METABOLIC PANEL WITH GFR
Anion gap: 8 (ref 5–15)
BUN: 28 mg/dL — ABNORMAL HIGH (ref 6–20)
CO2: 25 mmol/L (ref 22–32)
Calcium: 8.4 mg/dL — ABNORMAL LOW (ref 8.9–10.3)
Chloride: 104 mmol/L (ref 101–111)
Creatinine, Ser: 1.96 mg/dL — ABNORMAL HIGH (ref 0.44–1.00)
GFR calc Af Amer: 29 mL/min — ABNORMAL LOW
GFR calc non Af Amer: 25 mL/min — ABNORMAL LOW
Glucose, Bld: 118 mg/dL — ABNORMAL HIGH (ref 65–99)
Potassium: 4.4 mmol/L (ref 3.5–5.1)
Sodium: 137 mmol/L (ref 135–145)

## 2016-01-12 LAB — HEMOGLOBIN AND HEMATOCRIT, BLOOD
HCT: 29.7 % — ABNORMAL LOW (ref 36.0–46.0)
Hemoglobin: 9.9 g/dL — ABNORMAL LOW (ref 12.0–15.0)

## 2016-01-12 MED ORDER — PROMETHAZINE HCL 25 MG PO TABS: 12.5000 mg | ORAL_TABLET | Freq: Four times a day (QID) | ORAL | Status: DC | PRN

## 2016-01-12 NOTE — Progress Notes (Signed)
Patient ate small amount of lunch.  She has had some Vicodin at 11 am.  She complains of a little discomfort left flank region.  Serum creatinine noted and renal ultrasound report reviewed. Discussed with Dr. Gaynelle Arabian above results.  Abdomen is soft and non distended and non tender except very mild tenderness left costal margin  Voided 500 cc earlier with 50 cc residual . Patient reports urine was bloody  POD #1 Patient doing well.  Rising creatinine of some concern- discussed with Dr. Gaynelle Arabian He plans to repeat lab in am and if still rising will order lasix renal scan. Ureteral obstruction unlikely given stents were in place throughout entire surgery but edema with ureter/bladder may be a possibility.

## 2016-01-12 NOTE — Progress Notes (Addendum)
Patient's BP this am is 84/38, Pulse 68.  Hgb 9.9, Still has blood in her urine. BP has been steadily going down.  Called [place]. MD.   And received orders to increase IV fluid to 100 cc/o.  And recheck vitals again after she is more awake and sitting up.  We will recheck in about 30 min.  Patient has refused all oral medication because she felt nauseous and did not want to vomit.

## 2016-01-12 NOTE — Progress Notes (Signed)
Pt voided 345ml  bladder scanned post void for 41ml.  Will continue to monitor.  Erlinda Solinger Rn

## 2016-01-12 NOTE — Progress Notes (Signed)
PT Cancellation Note  Patient Details Name: Amanda [name] MRN: [id number] DOB: [birth year]   Cancelled Treatment:    Reason Eval/Treat Not Completed: Patient at procedure or test/unavailable (pt leaving room to go to ultrasound. Will follow. )   Philomena Doheny 01/12/2016, 11:43 AM [phone]

## 2016-01-12 NOTE — Progress Notes (Signed)
Urology Progress Note  Day of Surgery   Subjective: post op ck: s/p Vag Hys, Kelly plication with augmented anterior prolapse repair with apical suspension to the sacrospinus ligaments, Posterior pelvic floor repair, Altis single incision sling.  Awake, complains of various pains and nausea, refused various pain and home meds, feeling better this morning.       No acute urologic events overnight. Ambulation:   negative Flatus:    negative Bowel movement  negative  Pain: some relief. +nausea.   Objective:  Blood pressure 91/40, pulse 76, temperature 98.9 F (37.2 C), temperature source Oral, resp. rate 16, height 5' 4.5" (1.638 m), weight 57.153 kg (126 lb), SpO2 99 %.  Physical Exam:  General:  No acute distress, awake Extremities: extremities normal, atraumatic, no cyanosis or edema Genitourinary:  Normal GU Foley: urine clear.    I/O last 3 completed shifts: In: 2288.8 [P.O.:120; I.V.:2168.8] Out: 1475 [Urine:1250; Blood:225]  Recent Labs     01/11/16  1358  01/12/16  0421  HGB  11.3*  9.9*    Recent Labs     01/11/16  0655  NA  141  K  3.7  CL  104  BUN  19  CREATININE  0.80      Assessment/Plan:  DC vag pack and Catheter Transition to oral pain meds and toradol Ambulate Regular diet, med lock if taking PO Continue any current medications.

## 2016-01-12 NOTE — Progress Notes (Signed)
Patient is doing well.  Complains of some pain near her left side.  BP 91/40 mmHg  Pulse 76  Temp(Src) 98.9 F (37.2 C) (Oral)  Resp 16  Ht 5' 4.5" (1.638 m)  Wt 57.153 kg (126 lb)  BMI 21.30 kg/m2  SpO2 99% Abdomen is soft and non tender  No vaginal bleeding Packing and Foley already removed  Results for orders placed or performed during the hospital encounter of 01/11/16 (from the past 24 hour(s))  Hemoglobin and hematocrit, blood     Status: Abnormal   Collection Time: 01/11/16  1:58 PM  Result Value Ref Range   Hemoglobin 11.3 (L) 12.0 - 15.0 g/dL   HCT 33.2 (L) 36.0 - 46.0 %  Hemoglobin and hematocrit, blood     Status: Abnormal   Collection Time: 01/12/16  4:21 AM  Result Value Ref Range   Hemoglobin 9.9 (L) 12.0 - 15.0 g/dL   HCT 29.7 (L) 36.0 - 46.0 %   POD #1 Doing well Ambulate Advance diet Change to po pain meds

## 2016-01-13 LAB — CBC
HCT: 27.2 % — ABNORMAL LOW (ref 36.0–46.0)
HEMOGLOBIN: 9 g/dL — AB (ref 12.0–15.0)
MCH: 30.6 pg (ref 26.0–34.0)
MCHC: 33.1 g/dL (ref 30.0–36.0)
MCV: 92.5 fL (ref 78.0–100.0)
PLATELETS: 144 10*3/uL — AB (ref 150–400)
RBC: 2.94 MIL/uL — AB (ref 3.87–5.11)
RDW: 14.4 % (ref 11.5–15.5)
WBC: 10.6 10*3/uL — AB (ref 4.0–10.5)

## 2016-01-13 LAB — BASIC METABOLIC PANEL
ANION GAP: 7 (ref 5–15)
Anion gap: 6 (ref 5–15)
BUN: 27 mg/dL — AB (ref 6–20)
BUN: 24 mg/dL — AB (ref 6–20)
CO2: 23 mmol/L (ref 22–32)
CO2: 23 mmol/L (ref 22–32)
Calcium: 8.1 mg/dL — ABNORMAL LOW (ref 8.9–10.3)
Calcium: 8.4 mg/dL — ABNORMAL LOW (ref 8.9–10.3)
Chloride: 103 mmol/L (ref 101–111)
Chloride: 104 mmol/L (ref 101–111)
Creatinine, Ser: 1.89 mg/dL — ABNORMAL HIGH (ref 0.44–1.00)
Creatinine, Ser: 1.87 mg/dL — ABNORMAL HIGH (ref 0.44–1.00)
GFR, EST AFRICAN AMERICAN: 30 mL/min — AB (ref >60)
GFR, EST AFRICAN AMERICAN: 31 mL/min — AB (ref >60)
GFR, EST NON AFRICAN AMERICAN: 26 mL/min — AB (ref >60)
GFR, EST NON AFRICAN AMERICAN: 27 mL/min — AB (ref >60)
Glucose, Bld: 138 mg/dL — ABNORMAL HIGH (ref 65–99)
Glucose, Bld: 108 mg/dL — ABNORMAL HIGH (ref 65–99)
POTASSIUM: 4.5 mmol/L (ref 3.5–5.1)
POTASSIUM: 4.2 mmol/L (ref 3.5–5.1)
SODIUM: 133 mmol/L — AB (ref 135–145)
SODIUM: 133 mmol/L — AB (ref 135–145)

## 2016-01-13 MED ORDER — BISACODYL 10 MG RE SUPP
10.0000 mg | Freq: Once | RECTAL | Status: AC
  Administered 2016-01-13: 10 mg via RECTAL
  Filled 2016-01-13: qty 1

## 2016-01-13 MED ORDER — HYDROCODONE-ACETAMINOPHEN 5-325 MG PO TABS: 1.0000 | ORAL_TABLET | Freq: Four times a day (QID) | ORAL | Status: DC | PRN

## 2016-01-13 NOTE — Progress Notes (Signed)
Dulcolax suppository administered as ordered this am. Patient did not pass gas at this time and had encouraged to ambulate but refused. Ambulated to BR this am with NT assist. Will continue to monitor.

## 2016-01-13 NOTE — Progress Notes (Signed)
Patient ambulated with PT this shift. Per PT patient walked approximately 160 feet.

## 2016-01-13 NOTE — Evaluation (Signed)
Physical Therapy Evaluation Patient Details Name: Amanda [name] MRN: [id number] DOB: 1946/04/25 Today's Date: 01/13/2016   History of Present Illness  Pt is a 70 year old female s/p Vag Hys, Kelly plication with augmented anterior prolapse repair with apical suspension to the sacrospinus ligaments, Posterior pelvic floor repair, Altis single incision sling with hx of Parkinsons  Clinical Impression  Pt admitted with above diagnosis. Pt currently with functional limitations due to the deficits listed below (see PT Problem List).  Pt will benefit from skilled PT to increase their independence and safety with mobility to allow discharge to the venue listed below.  Pt slowly mobilizing and able to tolerate 160 feet around unit.  Pt encouraged to ambulate AT LEAST 3 times a day.  Pt will likely progress to d/c home without needs.  Will follow acutely.     Follow Up Recommendations No PT follow up;Supervision for mobility/OOB    Equipment Recommendations  None recommended by PT    Recommendations for Other Services       Precautions / Restrictions Precautions Precautions: None      Mobility  Bed Mobility Overal bed mobility: Modified Independent             General bed mobility comments: HOB elevated, very slow, utilizes rails  Transfers Overall transfer level: Needs assistance Equipment used: Rolling walker (2 wheeled) Transfers: Sit to/from Stand Sit to Stand: Min guard         General transfer comment: verbal cues for hand placement  Ambulation/Gait Ambulation/Gait assistance: Min guard Ambulation Distance (Feet): 160 Feet Assistive device: Rolling walker (2 wheeled) Gait Pattern/deviations: Step-through pattern;Decreased stride length;Shuffle Gait velocity: decr   General Gait Details: short shuffling gait, pt reports due to socks and may benefit from sneakers next session, noted hx of Parkinsons, encouraged larger steps and increasing pace however pt not  performing  Stairs            Wheelchair Mobility    Modified Rankin (Stroke Patients Only)       Balance                                             Pertinent Vitals/Pain Pain Assessment: 0-10 Pain Score: 7  Pain Location: surgical site Pain Descriptors / Indicators: Sore Pain Intervention(s): Limited activity within patient's tolerance;Monitored during session;Repositioned    Home Living   Living Arrangements: Spouse/significant other   Type of Home: House Home Access: Stairs to enter Entrance Stairs-Rails: Right Entrance Stairs-Number of Steps: 6 Home Layout: One level Home Equipment: Environmental consultant - 2 wheels      Prior Function Level of Independence: Independent               Hand Dominance        Extremity/Trunk Assessment               Lower Extremity Assessment: Generalized weakness         Communication   Communication: No difficulties  Cognition Arousal/Alertness: Awake/alert Behavior During Therapy: WFL for tasks assessed/performed Overall Cognitive Status: Within Functional Limits for tasks assessed                      General Comments      Exercises        Assessment/Plan    PT Assessment Patient needs continued PT services  PT Diagnosis Difficulty  walking;Acute pain   PT Problem List Decreased strength;Decreased activity tolerance;Decreased mobility;Pain;Decreased knowledge of use of DME  PT Treatment Interventions DME instruction;Gait training;Functional mobility training;Patient/family education;Therapeutic activities;Therapeutic exercise;Stair training   PT Goals (Current goals can be found in the Care Plan section) Acute Rehab PT Goals PT Goal Formulation: With patient Time For Goal Achievement: 01/20/16 Potential to Achieve Goals: Good    Frequency Min 3X/week   Barriers to discharge        Co-evaluation               End of Session   Activity Tolerance: Patient  tolerated treatment well Patient left: in bed;with call bell/phone within reach;with family/visitor present Nurse Communication: Mobility status         Time: [id number] PT Time Calculation (min) (ACUTE ONLY): 20 min   Charges:   PT Evaluation $PT Eval Low Complexity: 1 Procedure     PT G Codes:        Afsana Liera,KATHrine E 01/13/2016, 12:26 PM Carmelia Bake, PT, DPT 01/13/2016 Pager: [phone]

## 2016-01-13 NOTE — Progress Notes (Signed)
Urology Progress Note  Day of Surgery   Subjective: post op ck: s/p Vag Hys, Kelly plication with augmented anterior prolapse repair with apical suspension to the sacrospinus ligaments, Posterior pelvic floor repair, Altis single incision sling.  Awake, pain and nausea improved, continued left flank pain that waxes and wanes.       No acute urologic events overnight. Ambulation:   Positive Flatus:    Positive Bowel movement  negative  Pain: complete resolution. -nausea.   Objective:  Blood pressure 97/43, pulse 69, temperature 97.9 F (36.6 C), temperature source Oral, resp. rate 16, height 5' 4.5" (1.638 m), weight 57.153 kg (126 lb), SpO2 98 %.  Physical Exam:  General:  No acute distress, awake Extremities: extremities normal, atraumatic, no cyanosis or edema Genitourinary:  Normal GU Foley: urine clear.    I/O last 3 completed shifts: In: 4802.1 [P.O.:600; I.V.:4202.1] Out: 2075 [Urine:1850; Blood:225]  Recent Labs     01/12/16  0421  01/13/16  0443  HGB  9.9*  9.0*  WBC   --   10.6*  PLT   --   144*    Recent Labs     01/12/16  0808  01/13/16  0443  NA  137  133*  K  4.4  4.5  CL  104  103  CO2  25  23  BUN  28*  27*  CREATININE  1.96*  1.89*  CALCIUM  8.4*  8.1*  GFRNONAA  25*  26*  GFRAA  29*  30*      Assessment/Plan:  Ultrasound showed mild bilateral hydro consistent with preop CT, bump in creatinine likely medication related, Toradol stopped yesterday. Creatinine coming down slowly this morning, will plan repeat at 11 am with self hydration, if continueing down will likely discharge home with repeat creatinine on follow up.  Tolerating oral pain meds and toradol Ambulating Regular diet  med lock Continue any current medications.

## 2016-01-13 NOTE — Progress Notes (Signed)
Patient reports pain still left flank . Was gone overnight now reports some belching as well.  Urinating without difficulty - amber color  BP 97/43 mmHg  Pulse 69  Temp(Src) 97.9 F (36.6 C) (Oral)  Resp 16  Ht 5' 4.5" (1.638 m)  Wt 57.153 kg (126 lb)  BMI 21.30 kg/m2  SpO2 98% Results for orders placed or performed during the hospital encounter of 01/11/16 (from the past 24 hour(s))  CBC     Status: Abnormal   Collection Time: 01/13/16  4:43 AM  Result Value Ref Range   WBC 10.6 (H) 4.0 - 10.5 K/uL   RBC 2.94 (L) 3.87 - 5.11 MIL/uL   Hemoglobin 9.0 (L) 12.0 - 15.0 g/dL   HCT 27.2 (L) 36.0 - 46.0 %   MCV 92.5 78.0 - 100.0 fL   MCH 30.6 26.0 - 34.0 pg   MCHC 33.1 30.0 - 36.0 g/dL   RDW 14.4 11.5 - 15.5 %   Platelets 144 (L) 150 - 400 K/uL  Basic metabolic panel     Status: Abnormal   Collection Time: 01/13/16  4:43 AM  Result Value Ref Range   Sodium 133 (L) 135 - 145 mmol/L   Potassium 4.5 3.5 - 5.1 mmol/L   Chloride 103 101 - 111 mmol/L   CO2 23 22 - 32 mmol/L   Glucose, Bld 138 (H) 65 - 99 mg/dL   BUN 27 (H) 6 - 20 mg/dL   Creatinine, Ser 1.89 (H) 0.44 - 1.00 mg/dL   Calcium 8.1 (L) 8.9 - 10.3 mg/dL   GFR calc non Af Amer 26 (L) >60 mL/min   GFR calc Af Amer 30 (L) >60 mL/min   Anion gap 7 5 - 15   Abdomen is soft and slightly tender left flank / LUQ  No rebound or guarding  IMPRESSION: POD #2 Doing well Creatinine slightly decreased / stable Encourage breakfast this am / dulcolax suppository  I agree that once she starts passing gas I think she will feel better Follow up CMET later this am Encouraged ambulating

## 2016-01-13 NOTE — Discharge Summary (Signed)
Date of admission: 01/11/2016  Date of discharge: 01/13/2016  Admission diagnosis: Vaginal Prolapse  Discharge diagnosis: Same  Secondary diagnoses: Acute kidney injury  History and Physical: For full details, please see admission history and physical. Briefly, Amanda [name] is a 70 y.o. year old patient with vaginal prolapse.  Procedure(s): CYSTOSCOPY WITH BILATERAL RETROGRADE PYELOGRAM/URETERAL STENT PLACEMENT ANTERIOR REPAIR (CYSTOCELE) COLOPLAST FASCIA  SACROSPINOUS FIXATION  ALTIS SLING SINGLE INCISION TOTAL VAGINAL HYSTERECTOMY  POSTERIOR REPAIR ([facility]), [facility] Course: The patient underwent the above procedure. They tolerated surgery well and were transferred to the floor postoperatively. Their diet was slowly advanced and at the time of discharge they were tolerating a regular diet. At the time of discharge their pain was controlled with PO medications and they were ambulating without difficulty. Their foley was removed prior to discharge and they were voiding spontaneously. They were discharged home on Post-Op day 2.  There was an acute rise in creatinine POD#1 with an ultrasound that showed mild hydronephrosis. Likely etiology of the AKI was dehydration and ureteral manipulation with stents during surgery along with Toradol. The patient's creatinine trended down POD#2 and she was felt to be stable for discharge. She will follow up closely to evaluate renal function.     Laboratory values:  Recent Labs  01/11/16 1358 01/12/16 0421 01/13/16 0443  HGB 11.3* 9.9* 9.0*  HCT 33.2* 29.7* 27.2*    Recent Labs  01/13/16 0443 01/13/16 1044  CREATININE 1.89* 1.87*    Disposition: Home  Discharge instruction: The patient was instructed to be ambulatory but told to refrain from heavy lifting, strenuous activity, or driving.   Discharge medications:    Medication List    TAKE these medications        b complex vitamins tablet  Take 1 tablet by mouth  daily. MEGA FOOD BALANCE SOURCE     DHEA PO  Take 5 mg by mouth at bedtime.     HYDROcodone-acetaminophen 5-325 MG tablet  Commonly known as:  NORCO/VICODIN  Take 1-2 tablets by mouth every 6 (six) hours as needed for moderate pain.     NON FORMULARY  Grow Bone Raw Calcium/K+/ Magnesium/ D--   2 caps twice a day     NON FORMULARY  Apply topically 2 (two) times daily. Progesterone 3% and Bi-est Cream 0.1mg  (these are compound separate cream that pt mixes together and apply's  Twice daily  to arm and thigh     pramipexole 0.75 MG tablet  Commonly known as:  MIRAPEX  0.75 mg 3 (three) times daily.     PROBIOTIC DAILY Caps  Take by mouth as needed. Healthy Intestinal Flora     Vitamin D3 1000 UNIT/SPRAY Liqd  Take 2 sprays by mouth 2 (two) times daily.        Followup: in 1 week with Dr. Gaynelle Arabian for repeat BMP

## 2016-01-13 NOTE — Progress Notes (Signed)
Patient d/s home. Stable. Pain is controlled.

## 2016-01-14 ENCOUNTER — Telehealth: Payer: Self-pay | Admitting: Neurology

## 2016-01-14 NOTE — Telephone Encounter (Deleted)
Patient's husband is calling. He states the patient had major surgery last week and during the process of the surgery she had stopped taking

## 2016-01-14 NOTE — Telephone Encounter (Signed)
Patient's husband is calling. He states the patient had surgery this past Monday and is wondering if the dosage of pramipexole (MIRAPEX) 0.75 MG tablet can be increased. The patient took one pill before surgery, did not take any on Tuesday but did take one yesterday. The patient is moving very slowly, shaking and her husband wants to know if they should increase this medication to build up in her system. Please call and advise.

## 2016-01-14 NOTE — Telephone Encounter (Signed)
I spoke to husband. He is aware that patient should take 3 times a day. He was just wondering if she could double up if she missed a dose. I advised him not to make more than prescribed and if she misses a dose, to just pick up with next dose as directed.

## 2016-01-14 NOTE — Telephone Encounter (Signed)
She was supposed to keep taking it 3 times a day as soon as possible. Please make sure she is on her pre-surgery dose again.

## 2016-01-14 NOTE — Telephone Encounter (Signed)
Prescribed for 3 times a day... Do you want her to work up to the 3 times a day?

## 2016-01-20 DIAGNOSIS — Z0000 Encounter for general adult medical examination without abnormal findings: Secondary | ICD-10-CM | POA: Diagnosis not present

## 2016-01-20 DIAGNOSIS — N393 Stress incontinence (female) (male): Secondary | ICD-10-CM | POA: Diagnosis not present

## 2016-01-20 DIAGNOSIS — N289 Disorder of kidney and ureter, unspecified: Secondary | ICD-10-CM | POA: Diagnosis not present

## 2016-01-20 DIAGNOSIS — K6289 Other specified diseases of anus and rectum: Secondary | ICD-10-CM | POA: Diagnosis not present

## 2016-02-01 DIAGNOSIS — N8111 Cystocele, midline: Secondary | ICD-10-CM | POA: Diagnosis not present

## 2016-02-01 DIAGNOSIS — Z0000 Encounter for general adult medical examination without abnormal findings: Secondary | ICD-10-CM | POA: Diagnosis not present

## 2016-02-10 DIAGNOSIS — R278 Other lack of coordination: Secondary | ICD-10-CM | POA: Diagnosis not present

## 2016-02-10 DIAGNOSIS — N8111 Cystocele, midline: Secondary | ICD-10-CM | POA: Diagnosis not present

## 2016-02-10 DIAGNOSIS — K5909 Other constipation: Secondary | ICD-10-CM | POA: Diagnosis not present

## 2016-02-10 DIAGNOSIS — M6281 Muscle weakness (generalized): Secondary | ICD-10-CM | POA: Diagnosis not present

## 2016-02-10 DIAGNOSIS — N393 Stress incontinence (female) (male): Secondary | ICD-10-CM | POA: Diagnosis not present

## 2016-03-02 DIAGNOSIS — N3941 Urge incontinence: Secondary | ICD-10-CM | POA: Diagnosis not present

## 2016-03-02 DIAGNOSIS — M6281 Muscle weakness (generalized): Secondary | ICD-10-CM | POA: Diagnosis not present

## 2016-03-02 DIAGNOSIS — N8111 Cystocele, midline: Secondary | ICD-10-CM | POA: Diagnosis not present

## 2016-03-02 DIAGNOSIS — R278 Other lack of coordination: Secondary | ICD-10-CM | POA: Diagnosis not present

## 2016-03-04 ENCOUNTER — Encounter: Payer: Self-pay | Admitting: Family Medicine

## 2016-03-04 ENCOUNTER — Ambulatory Visit (INDEPENDENT_AMBULATORY_CARE_PROVIDER_SITE_OTHER): Payer: Medicare Other | Admitting: Family Medicine

## 2016-03-04 VITALS — BP 115/69 | HR 61 | Temp 98.3°F | Ht 63.75 in | Wt 120.2 lb

## 2016-03-04 DIAGNOSIS — Z23 Encounter for immunization: Secondary | ICD-10-CM

## 2016-03-04 DIAGNOSIS — Z1159 Encounter for screening for other viral diseases: Secondary | ICD-10-CM

## 2016-03-04 DIAGNOSIS — Z0000 Encounter for general adult medical examination without abnormal findings: Secondary | ICD-10-CM

## 2016-03-04 DIAGNOSIS — R29890 Loss of height: Secondary | ICD-10-CM

## 2016-03-04 DIAGNOSIS — G20 Parkinson's disease: Secondary | ICD-10-CM

## 2016-03-04 DIAGNOSIS — Z136 Encounter for screening for cardiovascular disorders: Secondary | ICD-10-CM

## 2016-03-04 DIAGNOSIS — E2839 Other primary ovarian failure: Secondary | ICD-10-CM

## 2016-03-04 NOTE — Progress Notes (Signed)
Pre visit review using our clinic review tool, if applicable. No additional management support is needed unless otherwise documented below in the visit note. 

## 2016-03-04 NOTE — Patient Instructions (Signed)
Preventive Care for Adults, Female A healthy lifestyle and preventive care can promote health and wellness. Preventive health guidelines for women include the following key practices.  A routine yearly physical is a good way to check with your health care provider about your health and preventive screening. It is a chance to share any concerns and updates on your health and to receive a thorough exam.  Visit your dentist for a routine exam and preventive care every 6 months. Brush your teeth twice a day and floss once a day. Good oral hygiene prevents tooth decay and gum disease.  The frequency of eye exams is based on your age, health, family medical history, use of contact lenses, and other factors. Follow your health care provider's recommendations for frequency of eye exams.  Eat a healthy diet. Foods like vegetables, fruits, whole grains, low-fat dairy products, and lean protein foods contain the nutrients you need without too many calories. Decrease your intake of foods high in solid fats, added sugars, and salt. Eat the right amount of calories for you.Get information about a proper diet from your health care provider, if necessary.  Regular physical exercise is one of the most important things you can do for your health. Most adults should get at least 150 minutes of moderate-intensity exercise (any activity that increases your heart rate and causes you to sweat) each week. In addition, most adults need muscle-strengthening exercises on 2 or more days a week.  Maintain a healthy weight. The body mass index (BMI) is a screening tool to identify possible weight problems. It provides an estimate of body fat based on height and weight. Your health care provider can find your BMI and can help you achieve or maintain a healthy weight.For adults 20 years and older:  A BMI below 18.5 is considered underweight.  A BMI of 18.5 to 24.9 is normal.  A BMI of 25 to 29.9 is considered overweight.  A  BMI of 30 and above is considered obese.  Maintain normal blood lipids and cholesterol levels by exercising and minimizing your intake of saturated fat. Eat a balanced diet with plenty of fruit and vegetables. Blood tests for lipids and cholesterol should begin at age 45 and be repeated every 5 years. If your lipid or cholesterol levels are high, you are over 50, or you are at high risk for heart disease, you may need your cholesterol levels checked more frequently.Ongoing high lipid and cholesterol levels should be treated with medicines if diet and exercise are not working.  If you smoke, find out from your health care provider how to quit. If you do not use tobacco, do not start.  Lung cancer screening is recommended for adults aged 45-80 years who are at high risk for developing lung cancer because of a history of smoking. A yearly low-dose CT scan of the lungs is recommended for people who have at least a 30-pack-year history of smoking and are a current smoker or have quit within the past 15 years. A pack year of smoking is smoking an average of 1 pack of cigarettes a day for 1 year (for example: 1 pack a day for 30 years or 2 packs a day for 15 years). Yearly screening should continue until the smoker has stopped smoking for at least 15 years. Yearly screening should be stopped for people who develop a health problem that would prevent them from having lung cancer treatment.  If you are pregnant, do not drink alcohol. If you are  breastfeeding, be very cautious about drinking alcohol. If you are not pregnant and choose to drink alcohol, do not have more than 1 drink per day. One drink is considered to be 12 ounces (355 mL) of beer, 5 ounces (148 mL) of wine, or 1.5 ounces (44 mL) of liquor.  Avoid use of street drugs. Do not share needles with anyone. Ask for help if you need support or instructions about stopping the use of drugs.  High blood pressure causes heart disease and increases the risk  of stroke. Your blood pressure should be checked at least every 1 to 2 years. Ongoing high blood pressure should be treated with medicines if weight loss and exercise do not work.  If you are 55-79 years old, ask your health care provider if you should take aspirin to prevent strokes.  Diabetes screening is done by taking a blood sample to check your blood glucose level after you have not eaten for a certain period of time (fasting). If you are not overweight and you do not have risk factors for diabetes, you should be screened once every 3 years starting at age 45. If you are overweight or obese and you are 40-70 years of age, you should be screened for diabetes every year as part of your cardiovascular risk assessment.  Breast cancer screening is essential preventive care for women. You should practice "breast self-awareness." This means understanding the normal appearance and feel of your breasts and may include breast self-examination. Any changes detected, no matter how small, should be reported to a health care provider. Women in their 20s and 30s should have a clinical breast exam (CBE) by a health care provider as part of a regular health exam every 1 to 3 years. After age 40, women should have a CBE every year. Starting at age 40, women should consider having a mammogram (breast X-ray test) every year. Women who have a family history of breast cancer should talk to their health care provider about genetic screening. Women at a high risk of breast cancer should talk to their health care providers about having an MRI and a mammogram every year.  Breast cancer gene (BRCA)-related cancer risk assessment is recommended for women who have family members with BRCA-related cancers. BRCA-related cancers include breast, ovarian, tubal, and peritoneal cancers. Having family members with these cancers may be associated with an increased risk for harmful changes (mutations) in the breast cancer genes BRCA1 and  BRCA2. Results of the assessment will determine the need for genetic counseling and BRCA1 and BRCA2 testing.  Your health care provider may recommend that you be screened regularly for cancer of the pelvic organs (ovaries, uterus, and vagina). This screening involves a pelvic examination, including checking for microscopic changes to the surface of your cervix (Pap test). You may be encouraged to have this screening done every 3 years, beginning at age 21.  For women ages 30-65, health care providers may recommend pelvic exams and Pap testing every 3 years, or they may recommend the Pap and pelvic exam, combined with testing for human papilloma virus (HPV), every 5 years. Some types of HPV increase your risk of cervical cancer. Testing for HPV may also be done on women of any age with unclear Pap test results.  Other health care providers may not recommend any screening for nonpregnant women who are considered low risk for pelvic cancer and who do not have symptoms. Ask your health care provider if a screening pelvic exam is right for   you.  If you have had past treatment for cervical cancer or a condition that could lead to cancer, you need Pap tests and screening for cancer for at least 20 years after your treatment. If Pap tests have been discontinued, your risk factors (such as having a new sexual partner) need to be reassessed to determine if screening should resume. Some women have medical problems that increase the chance of getting cervical cancer. In these cases, your health care provider may recommend more frequent screening and Pap tests.  Colorectal cancer can be detected and often prevented. Most routine colorectal cancer screening begins at the age of 50 years and continues through age 75 years. However, your health care provider may recommend screening at an earlier age if you have risk factors for colon cancer. On a yearly basis, your health care provider may provide home test kits to check  for hidden blood in the stool. Use of a small camera at the end of a tube, to directly examine the colon (sigmoidoscopy or colonoscopy), can detect the earliest forms of colorectal cancer. Talk to your health care provider about this at age 50, when routine screening begins. Direct exam of the colon should be repeated every 5-10 years through age 75 years, unless early forms of precancerous polyps or small growths are found.  People who are at an increased risk for hepatitis B should be screened for this virus. You are considered at high risk for hepatitis B if:  You were born in a country where hepatitis B occurs often. Talk with your health care provider about which countries are considered high risk.  Your parents were born in a high-risk country and you have not received a shot to protect against hepatitis B (hepatitis B vaccine).  You have HIV or AIDS.  You use needles to inject street drugs.  You live with, or have sex with, someone who has hepatitis B.  You get hemodialysis treatment.  You take certain medicines for conditions like cancer, organ transplantation, and autoimmune conditions.  Hepatitis C blood testing is recommended for all people born from 1945 through 1965 and any individual with known risks for hepatitis C.  Practice safe sex. Use condoms and avoid high-risk sexual practices to reduce the spread of sexually transmitted infections (STIs). STIs include gonorrhea, chlamydia, syphilis, trichomonas, herpes, HPV, and human immunodeficiency virus (HIV). Herpes, HIV, and HPV are viral illnesses that have no cure. They can result in disability, cancer, and death.  You should be screened for sexually transmitted illnesses (STIs) including gonorrhea and chlamydia if:  You are sexually active and are younger than 24 years.  You are older than 24 years and your health care provider tells you that you are at risk for this type of infection.  Your sexual activity has changed  since you were last screened and you are at an increased risk for chlamydia or gonorrhea. Ask your health care provider if you are at risk.  If you are at risk of being infected with HIV, it is recommended that you take a prescription medicine daily to prevent HIV infection. This is called preexposure prophylaxis (PrEP). You are considered at risk if:  You are sexually active and do not regularly use condoms or know the HIV status of your partner(s).  You take drugs by injection.  You are sexually active with a partner who has HIV.  Talk with your health care provider about whether you are at high risk of being infected with HIV. If   you choose to begin PrEP, you should first be tested for HIV. You should then be tested every 3 months for as long as you are taking PrEP.  Osteoporosis is a disease in which the bones lose minerals and strength with aging. This can result in serious bone fractures or breaks. The risk of osteoporosis can be identified using a bone density scan. Women ages 67 years and over and women at risk for fractures or osteoporosis should discuss screening with their health care providers. Ask your health care provider whether you should take a calcium supplement or vitamin D to reduce the rate of osteoporosis.  Menopause can be associated with physical symptoms and risks. Hormone replacement therapy is available to decrease symptoms and risks. You should talk to your health care provider about whether hormone replacement therapy is right for you.  Use sunscreen. Apply sunscreen liberally and repeatedly throughout the day. You should seek shade when your shadow is shorter than you. Protect yourself by wearing long sleeves, pants, a wide-brimmed hat, and sunglasses year round, whenever you are outdoors.  Once a month, do a whole body skin exam, using a mirror to look at the skin on your back. Tell your health care provider of new moles, moles that have irregular borders, moles that  are larger than a pencil eraser, or moles that have changed in shape or color.  Stay current with required vaccines (immunizations).  Influenza vaccine. All adults should be immunized every year.  Tetanus, diphtheria, and acellular pertussis (Td, Tdap) vaccine. Pregnant women should receive 1 dose of Tdap vaccine during each pregnancy. The dose should be obtained regardless of the length of time since the last dose. Immunization is preferred during the 27th-36th week of gestation. An adult who has not previously received Tdap or who does not know her vaccine status should receive 1 dose of Tdap. This initial dose should be followed by tetanus and diphtheria toxoids (Td) booster doses every 10 years. Adults with an unknown or incomplete history of completing a 3-dose immunization series with Td-containing vaccines should begin or complete a primary immunization series including a Tdap dose. Adults should receive a Td booster every 10 years.  Varicella vaccine. An adult without evidence of immunity to varicella should receive 2 doses or a second dose if she has previously received 1 dose. Pregnant females who do not have evidence of immunity should receive the first dose after pregnancy. This first dose should be obtained before leaving the health care facility. The second dose should be obtained 4-8 weeks after the first dose.  Human papillomavirus (HPV) vaccine. Females aged 13-26 years who have not received the vaccine previously should obtain the 3-dose series. The vaccine is not recommended for use in pregnant females. However, pregnancy testing is not needed before receiving a dose. If a female is found to be pregnant after receiving a dose, no treatment is needed. In that case, the remaining doses should be delayed until after the pregnancy. Immunization is recommended for any person with an immunocompromised condition through the age of 61 years if she did not get any or all doses earlier. During the  3-dose series, the second dose should be obtained 4-8 weeks after the first dose. The third dose should be obtained 24 weeks after the first dose and 16 weeks after the second dose.  Zoster vaccine. One dose is recommended for adults aged 30 years or older unless certain conditions are present.  Measles, mumps, and rubella (MMR) vaccine. Adults born  before 1957 generally are considered immune to measles and mumps. Adults born in 1957 or later should have 1 or more doses of MMR vaccine unless there is a contraindication to the vaccine or there is laboratory evidence of immunity to each of the three diseases. A routine second dose of MMR vaccine should be obtained at least 28 days after the first dose for students attending postsecondary schools, health care workers, or international travelers. People who received inactivated measles vaccine or an unknown type of measles vaccine during 1963-1967 should receive 2 doses of MMR vaccine. People who received inactivated mumps vaccine or an unknown type of mumps vaccine before 1979 and are at high risk for mumps infection should consider immunization with 2 doses of MMR vaccine. For females of childbearing age, rubella immunity should be determined. If there is no evidence of immunity, females who are not pregnant should be vaccinated. If there is no evidence of immunity, females who are pregnant should delay immunization until after pregnancy. Unvaccinated health care workers born before 1957 who lack laboratory evidence of measles, mumps, or rubella immunity or laboratory confirmation of disease should consider measles and mumps immunization with 2 doses of MMR vaccine or rubella immunization with 1 dose of MMR vaccine.  Pneumococcal 13-valent conjugate (PCV13) vaccine. When indicated, a person who is uncertain of his immunization history and has no record of immunization should receive the PCV13 vaccine. All adults 65 years of age and older should receive this  vaccine. An adult aged 19 years or older who has certain medical conditions and has not been previously immunized should receive 1 dose of PCV13 vaccine. This PCV13 should be followed with a dose of pneumococcal polysaccharide (PPSV23) vaccine. Adults who are at high risk for pneumococcal disease should obtain the PPSV23 vaccine at least 8 weeks after the dose of PCV13 vaccine. Adults older than 69 years of age who have normal immune system function should obtain the PPSV23 vaccine dose at least 1 year after the dose of PCV13 vaccine.  Pneumococcal polysaccharide (PPSV23) vaccine. When PCV13 is also indicated, PCV13 should be obtained first. All adults aged 65 years and older should be immunized. An adult younger than age 65 years who has certain medical conditions should be immunized. Any person who resides in a nursing home or long-term care facility should be immunized. An adult smoker should be immunized. People with an immunocompromised condition and certain other conditions should receive both PCV13 and PPSV23 vaccines. People with human immunodeficiency virus (HIV) infection should be immunized as soon as possible after diagnosis. Immunization during chemotherapy or radiation therapy should be avoided. Routine use of PPSV23 vaccine is not recommended for American Indians, [place] Natives, or people younger than 65 years unless there are medical conditions that require PPSV23 vaccine. When indicated, people who have unknown immunization and have no record of immunization should receive PPSV23 vaccine. One-time revaccination 5 years after the first dose of PPSV23 is recommended for people aged 19-64 years who have chronic kidney failure, nephrotic syndrome, asplenia, or immunocompromised conditions. People who received 1-2 doses of PPSV23 before age 65 years should receive another dose of PPSV23 vaccine at age 65 years or later if at least 5 years have passed since the previous dose. Doses of PPSV23 are not  needed for people immunized with PPSV23 at or after age 65 years.  Meningococcal vaccine. Adults with asplenia or persistent complement component deficiencies should receive 2 doses of quadrivalent meningococcal conjugate (MenACWY-D) vaccine. The doses should be obtained   at least 2 months apart. Microbiologists working with certain meningococcal bacteria, [place] recruits, people at risk during an outbreak, and people who travel to or live in countries with a high rate of meningitis should be immunized. A first-year college student up through age 34 years who is living in a residence hall should receive a dose if she did not receive a dose on or after her 16th birthday. Adults who have certain high-risk conditions should receive one or more doses of vaccine.  Hepatitis A vaccine. Adults who wish to be protected from this disease, have certain high-risk conditions, work with hepatitis A-infected animals, work in hepatitis A research labs, or travel to or work in countries with a high rate of hepatitis A should be immunized. Adults who were previously unvaccinated and who anticipate close contact with an international adoptee during the first 60 days after arrival in the [place] from a country with a high rate of hepatitis A should be immunized.  Hepatitis B vaccine. Adults who wish to be protected from this disease, have certain high-risk conditions, may be exposed to blood or other infectious body fluids, are household contacts or sex partners of hepatitis B positive people, are clients or workers in certain care facilities, or travel to or work in countries with a high rate of hepatitis B should be immunized.  Haemophilus influenzae type b (Hib) vaccine. A previously unvaccinated person with asplenia or sickle cell disease or having a scheduled splenectomy should receive 1 dose of Hib vaccine. Regardless of previous immunization, a recipient of a hematopoietic stem cell transplant should receive a  3-dose series 6-12 months after her successful transplant. Hib vaccine is not recommended for adults with HIV infection. Preventive Services / Frequency Ages 35 to 4 years  Blood pressure check.** / Every 3-5 years.  Lipid and cholesterol check.** / Every 5 years beginning at age 60.  Clinical breast exam.** / Every 3 years for women in their 71s and 10s.  BRCA-related cancer risk assessment.** / For women who have family members with a BRCA-related cancer (breast, ovarian, tubal, or peritoneal cancers).  Pap test.** / Every 2 years from ages 76 through 26. Every 3 years starting at age 61 through age 76 or 93 with a history of 3 consecutive normal Pap tests.  HPV screening.** / Every 3 years from ages 37 through ages 60 to 51 with a history of 3 consecutive normal Pap tests.  Hepatitis C blood test.** / For any individual with known risks for hepatitis C.  Skin self-exam. / Monthly.  Influenza vaccine. / Every year.  Tetanus, diphtheria, and acellular pertussis (Tdap, Td) vaccine.** / Consult your health care provider. Pregnant women should receive 1 dose of Tdap vaccine during each pregnancy. 1 dose of Td every 10 years.  Varicella vaccine.** / Consult your health care provider. Pregnant females who do not have evidence of immunity should receive the first dose after pregnancy.  HPV vaccine. / 3 doses over 6 months, if 93 and younger. The vaccine is not recommended for use in pregnant females. However, pregnancy testing is not needed before receiving a dose.  Measles, mumps, rubella (MMR) vaccine.** / You need at least 1 dose of MMR if you were born in 1957 or later. You may also need a 2nd dose. For females of childbearing age, rubella immunity should be determined. If there is no evidence of immunity, females who are not pregnant should be vaccinated. If there is no evidence of immunity, females who are  pregnant should delay immunization until after pregnancy.  Pneumococcal  13-valent conjugate (PCV13) vaccine.** / Consult your health care provider.  Pneumococcal polysaccharide (PPSV23) vaccine.** / 1 to 2 doses if you smoke cigarettes or if you have certain conditions.  Meningococcal vaccine.** / 1 dose if you are age 68 to 8 years and a Market researcher living in a residence hall, or have one of several medical conditions, you need to get vaccinated against meningococcal disease. You may also need additional booster doses.  Hepatitis A vaccine.** / Consult your health care provider.  Hepatitis B vaccine.** / Consult your health care provider.  Haemophilus influenzae type b (Hib) vaccine.** / Consult your health care provider. Ages 7 to 53 years  Blood pressure check.** / Every year.  Lipid and cholesterol check.** / Every 5 years beginning at age 25 years.  Lung cancer screening. / Every year if you are aged 11-80 years and have a 30-pack-year history of smoking and currently smoke or have quit within the past 15 years. Yearly screening is stopped once you have quit smoking for at least 15 years or develop a health problem that would prevent you from having lung cancer treatment.  Clinical breast exam.** / Every year after age 48 years.  BRCA-related cancer risk assessment.** / For women who have family members with a BRCA-related cancer (breast, ovarian, tubal, or peritoneal cancers).  Mammogram.** / Every year beginning at age 41 years and continuing for as long as you are in good health. Consult with your health care provider.  Pap test.** / Every 3 years starting at age 65 years through age 37 or 70 years with a history of 3 consecutive normal Pap tests.  HPV screening.** / Every 3 years from ages 72 years through ages 60 to 40 years with a history of 3 consecutive normal Pap tests.  Fecal occult blood test (FOBT) of stool. / Every year beginning at age 21 years and continuing until age 5 years. You may not need to do this test if you get  a colonoscopy every 10 years.  Flexible sigmoidoscopy or colonoscopy.** / Every 5 years for a flexible sigmoidoscopy or every 10 years for a colonoscopy beginning at age 35 years and continuing until age 48 years.  Hepatitis C blood test.** / For all people born from [birth year] through 1965 and any individual with known risks for hepatitis C.  Skin self-exam. / Monthly.  Influenza vaccine. / Every year.  Tetanus, diphtheria, and acellular pertussis (Tdap/Td) vaccine.** / Consult your health care provider. Pregnant women should receive 1 dose of Tdap vaccine during each pregnancy. 1 dose of Td every 10 years.  Varicella vaccine.** / Consult your health care provider. Pregnant females who do not have evidence of immunity should receive the first dose after pregnancy.  Zoster vaccine.** / 1 dose for adults aged 30 years or older.  Measles, mumps, rubella (MMR) vaccine.** / You need at least 1 dose of MMR if you were born in 1957 or later. You may also need a second dose. For females of childbearing age, rubella immunity should be determined. If there is no evidence of immunity, females who are not pregnant should be vaccinated. If there is no evidence of immunity, females who are pregnant should delay immunization until after pregnancy.  Pneumococcal 13-valent conjugate (PCV13) vaccine.** / Consult your health care provider.  Pneumococcal polysaccharide (PPSV23) vaccine.** / 1 to 2 doses if you smoke cigarettes or if you have certain conditions.  Meningococcal vaccine.** /  Consult your health care provider.  Hepatitis A vaccine.** / Consult your health care provider.  Hepatitis B vaccine.** / Consult your health care provider.  Haemophilus influenzae type b (Hib) vaccine.** / Consult your health care provider. Ages 64 years and over  Blood pressure check.** / Every year.  Lipid and cholesterol check.** / Every 5 years beginning at age 23 years.  Lung cancer screening. / Every year if you  are aged 16-80 years and have a 30-pack-year history of smoking and currently smoke or have quit within the past 15 years. Yearly screening is stopped once you have quit smoking for at least 15 years or develop a health problem that would prevent you from having lung cancer treatment.  Clinical breast exam.** / Every year after age 74 years.  BRCA-related cancer risk assessment.** / For women who have family members with a BRCA-related cancer (breast, ovarian, tubal, or peritoneal cancers).  Mammogram.** / Every year beginning at age 44 years and continuing for as long as you are in good health. Consult with your health care provider.  Pap test.** / Every 3 years starting at age 58 years through age 22 or 39 years with 3 consecutive normal Pap tests. Testing can be stopped between 65 and 70 years with 3 consecutive normal Pap tests and no abnormal Pap or HPV tests in the past 10 years.  HPV screening.** / Every 3 years from ages 64 years through ages 70 or 61 years with a history of 3 consecutive normal Pap tests. Testing can be stopped between 65 and 70 years with 3 consecutive normal Pap tests and no abnormal Pap or HPV tests in the past 10 years.  Fecal occult blood test (FOBT) of stool. / Every year beginning at age 40 years and continuing until age 27 years. You may not need to do this test if you get a colonoscopy every 10 years.  Flexible sigmoidoscopy or colonoscopy.** / Every 5 years for a flexible sigmoidoscopy or every 10 years for a colonoscopy beginning at age 7 years and continuing until age 32 years.  Hepatitis C blood test.** / For all people born from [birth year] through 1964 and any individual with known risks for hepatitis C.  Osteoporosis screening.** / A one-time screening for women ages 30 years and over and women at risk for fractures or osteoporosis.  Skin self-exam. / Monthly.  Influenza vaccine. / Every year.  Tetanus, diphtheria, and acellular pertussis (Tdap/Td)  vaccine.** / 1 dose of Td every 10 years.  Varicella vaccine.** / Consult your health care provider.  Zoster vaccine.** / 1 dose for adults aged 35 years or older.  Pneumococcal 13-valent conjugate (PCV13) vaccine.** / Consult your health care provider.  Pneumococcal polysaccharide (PPSV23) vaccine.** / 1 dose for all adults aged 46 years and older.  Meningococcal vaccine.** / Consult your health care provider.  Hepatitis A vaccine.** / Consult your health care provider.  Hepatitis B vaccine.** / Consult your health care provider.  Haemophilus influenzae type b (Hib) vaccine.** / Consult your health care provider. ** Family history and personal history of risk and conditions may change your health care provider's recommendations.   This information is not intended to replace advice given to you by your health care provider. Make sure you discuss any questions you have with your health care provider.   Document Released: 11/01/2001 Document Revised: 09/26/2014 Document Reviewed: 01/31/2011 Elsevier Interactive Patient Education [employer].

## 2016-03-04 NOTE — Progress Notes (Signed)
Subjective:   Amanda [name] is a 70 y.o. female who presents for Medicare Annual (Subsequent) preventive examination.  Review of Systems:   Review of Systems  Constitutional: Negative for activity change, appetite change and fatigue.  HENT: Negative for hearing loss, congestion, tinnitus and ear discharge.   Eyes: Negative for visual disturbance (see optho q1y -- vision corrected to 20/20 with glasses).  Respiratory: Negative for cough, chest tightness and shortness of breath.   Cardiovascular: Negative for chest pain, palpitations and leg swelling.  Gastrointestinal: Negative for abdominal pain, diarrhea, constipation and abdominal distention.  Genitourinary: Negative for urgency, frequency, decreased urine volume and difficulty urinating.  Musculoskeletal: Negative for back pain, arthralgias and gait problem.  Skin: Negative for color change, pallor and rash.  Neurological: Negative for dizziness, light-headedness, numbness and headaches.  Hematological: Negative for adenopathy. Does not bruise/bleed easily.  Psychiatric/Behavioral: Negative for suicidal ideas, confusion, sleep disturbance, self-injury, dysphoric mood, decreased concentration and agitation.  Pt is able to read and write and can do all ADLs No risk for falling No abuse/ violence in home           Objective:     Vitals: BP 115/69 mmHg  Pulse 61  Temp(Src) 98.3 F (36.8 C) (Oral)  Ht 5' 3.75" (1.619 m)  Wt 120 lb 3.2 oz (54.522 kg)  BMI 20.80 kg/m2  SpO2 96%  Body mass index is 20.8 kg/(m^2).  BP 115/69 mmHg  Pulse 61  Temp(Src) 98.3 F (36.8 C) (Oral)  Ht 5' 3.75" (1.619 m)  Wt 120 lb 3.2 oz (54.522 kg)  BMI 20.80 kg/m2  SpO2 96% General appearance: alert, cooperative, appears stated age and no distress Head: Normocephalic, without obvious abnormality, atraumatic Nose: Nares normal. Septum midline. Mucosa normal. No drainage or sinus tenderness. Throat: lips, mucosa, and tongue normal; teeth and  gums normal Neck: no adenopathy, no carotid bruit, no JVD, supple, symmetrical, trachea midline and thyroid not enlarged, symmetric, no tenderness/mass/nodules Lungs: clear to auscultation bilaterally Heart: regular rate and rhythm, S1, S2 normal, no murmur, click, rub or gallop Extremities: extremities normal, atraumatic, no cyanosis or edema  Tobacco History  Smoking status  . Never Smoker   Smokeless tobacco  . Never Used     Counseling given: Not Answered   Past Medical History  Diagnosis Date  . Osteopenia   . Parkinson disease [facility])     neurologist-  dr Arvella Nigh--  right side predominant  . Prolapse of vaginal wall   . GAD (generalized anxiety disorder)   . Urgency of urination   . Frequency of urination   . Wears glasses    Past Surgical History  Procedure Laterality Date  . Laparoscopic ovarian cystectomy  March 2008    unilateral  . External ear surgery Bilateral age 28  . Colonoscopy  last one 2007  . Esophagogastroduodenoscopy  2007  . Cystoscopy w/ ureteral stent placement Bilateral 01/11/2016    Procedure: CYSTOSCOPY WITH BILATERAL RETROGRADE PYELOGRAM/URETERAL STENT PLACEMENT;  Surgeon: Carolan Clines, MD;  Location: [facility];  Service: Urology;  Laterality: Bilateral;  . Cystocele repair N/A 01/11/2016    Procedure: ANTERIOR REPAIR (CYSTOCELE) COLOPLAST FASCIA  SACROSPINOUS FIXATION;  Surgeon: Carolan Clines, MD;  Location: [facility];  Service: Urology;  Laterality: N/A;  . Pubovaginal sling N/A 01/11/2016    Procedure:  ALTIS SLING SINGLE INCISION;  Surgeon: Carolan Clines, MD;  Location: [facility];  Service: Urology;  Laterality: N/A;  . Vaginal hysterectomy  N/A 01/11/2016    Procedure: TOTAL VAGINAL HYSTERECTOMY ;  Surgeon: Dian Queen, MD;  Location: [facility];  Service: Gynecology;  Laterality: N/A;  . Rectocele repair N/A 01/11/2016    Procedure: POSTERIOR REPAIR  (RECTOCELE), PERINEAPLASTY;  Surgeon: Dian Queen, MD;  Location: [place];  Service: Gynecology;  Laterality: N/A;   Family History  Problem Relation Age of Onset  . Heart disease Mother     enlarged heart  . Cancer Father     pancreatic  . COPD Sister   . Pulmonary embolism Sister   . Heart disease Brother   . Polycystic kidney disease Son    History  Sexual Activity  . Sexual Activity: Not on file    Outpatient Encounter Prescriptions as of 03/04/2016  Medication Sig  . b complex vitamins tablet Take 1 tablet by mouth daily. MEGA FOOD BALANCE SOURCE  . Cholecalciferol (VITAMIN D3) 1000 UNIT/SPRAY LIQD Take 2 sprays by mouth 2 (two) times daily.   . NON FORMULARY Apply topically 2 (two) times daily. Progesterone 3% and Bi-est Cream 0.1mg  (these are compound separate cream that pt mixes together and apply's  Twice daily  to arm and thigh  . NON FORMULARY Grow Bone Raw Calcium/K+/ Magnesium/ D--   2 caps twice a day  . Nutritional Supplements (DHEA PO) Take 5 mg by mouth at bedtime.   . pramipexole (MIRAPEX) 0.75 MG tablet 0.75 mg 3 (three) times daily.  . Probiotic Product (PROBIOTIC DAILY) CAPS Take by mouth as needed. Healthy Intestinal Flora  . [DISCONTINUED] HYDROcodone-acetaminophen (NORCO/VICODIN) 5-325 MG tablet Take 1-2 tablets by mouth every 6 (six) hours as needed for moderate pain.   No facility-administered encounter medications on file as of 03/04/2016.    Activities of Daily Living In your present state of health, do you have any difficulty performing the following activities: 03/04/2016 01/11/2016  Hearing? N N  Vision? N N  Difficulty concentrating or making decisions? N N  Walking or climbing stairs? N Y  Dressing or bathing? N Y  Doing errands, shopping? N N    Patient Care Team: Ann Held, DO as PCP - General Star Age, MD as Attending Physician (Neurology) Dian Queen, MD as Consulting Physician (Obstetrics and  Gynecology) Katy Apo, MD as Consulting Physician (Ophthalmology) Lyndal Pulley, DO as Attending Physician (Family Medicine) Eldridge Abrahams, MD as Referring Physician (Neurology) Carolan Clines, MD as Consulting Physician (Urology)    Assessment:    CPE Exercise Activities and Dietary recommendations Current Exercise Habits: Structured exercise class, Type of exercise: strength training/weights;walking, Frequency (Times/Week): 3, Intensity: Mild, Exercise limited by: neurologic condition(s)  Goals    None     Fall Risk Fall Risk  03/04/2016 01/04/2016 07/28/2015 07/28/2015 03/05/2015  Falls in the past year? No No Yes No No  Number falls in past yr: - - 1 - -  Injury with Fall? - - No - -   Depression Screen PHQ 2/9 Scores 03/04/2016 03/05/2015 03/05/2015 01/09/2014  PHQ - 2 Score 0 4 1 0  PHQ- 9 Score - 7 - -     Cognitive Testing mmse 30/30  Immunization History  Administered Date(s) Administered  . Influenza Split 08/17/2011  . Influenza Whole 10/21/2009  . Influenza,inj,Quad PF,36+ Mos 07/12/2013  . Pneumococcal Conjugate-13 03/05/2015  . Td 04/22/2004, 10/21/2009  . Zoster 06/26/2008   Screening Tests Health Maintenance  Topic Date Due  . Hepatitis C Screening  10-26-1945  . COLONOSCOPY  06/23/2014  .  PNA vac Low Risk Adult (2 of 2 - PPSV23) 03/04/2016  . INFLUENZA VACCINE  04/19/2016  . MAMMOGRAM  11/09/2016  . TETANUS/TDAP  10/22/2019  . DEXA SCAN  Completed  . ZOSTAVAX  Completed      Plan:   see AVS During the course of the visit the patient was educated and counseled about the following appropriate screening and preventive services:   Vaccines to include Pneumoccal, Influenza, Hepatitis B, Td, Zostavax, HCV  Electrocardiogram  Cardiovascular Disease  Colorectal cancer screening  Bone density screening  Diabetes screening  Glaucoma screening  Mammography/PAP  Nutrition counseling   Patient Instructions (the written plan) was given  to the patient.  1. Estrogen deficiency   - DG Bone Density; Future - Lipid panel - POCT urinalysis dipstick - CBC with Differential/Platelet - Comprehensive metabolic panel  2. Loss of height   - DG Bone Density; Future - Lipid panel - POCT urinalysis dipstick - CBC with Differential/Platelet - Comprehensive metabolic panel  3. Parkinson's disease ([place])   - Lipid panel - POCT urinalysis dipstick - CBC with Differential/Platelet - Comprehensive metabolic panel  4. Screening, ischemic heart disease   - Lipid panel  5. Need for hepatitis C screening test   - Hepatitis C Antibody  6. Routine history and physical examination of adult    7. Need for pneumococcal vaccination   - Pneumococcal polysaccharide vaccine 23-valent greater than or equal to 2yo subcutaneous/IM   Ann Held, DO  03/04/2016

## 2016-03-07 ENCOUNTER — Telehealth: Payer: Self-pay

## 2016-03-07 NOTE — Telephone Encounter (Signed)
Ordered via the Portal.    KP

## 2016-03-07 NOTE — Telephone Encounter (Signed)
-----   Message from Ann Held, DO sent at 03/04/2016 10:47 AM EDT ----- Pt would like do cologuard

## 2016-03-08 ENCOUNTER — Other Ambulatory Visit: Payer: Medicare Other

## 2016-03-08 DIAGNOSIS — L814 Other melanin hyperpigmentation: Secondary | ICD-10-CM | POA: Diagnosis not present

## 2016-03-08 DIAGNOSIS — B353 Tinea pedis: Secondary | ICD-10-CM | POA: Diagnosis not present

## 2016-03-08 DIAGNOSIS — D1801 Hemangioma of skin and subcutaneous tissue: Secondary | ICD-10-CM | POA: Diagnosis not present

## 2016-03-08 DIAGNOSIS — L821 Other seborrheic keratosis: Secondary | ICD-10-CM | POA: Diagnosis not present

## 2016-03-08 DIAGNOSIS — L918 Other hypertrophic disorders of the skin: Secondary | ICD-10-CM | POA: Diagnosis not present

## 2016-03-08 DIAGNOSIS — B351 Tinea unguium: Secondary | ICD-10-CM | POA: Diagnosis not present

## 2016-03-24 DIAGNOSIS — R278 Other lack of coordination: Secondary | ICD-10-CM | POA: Diagnosis not present

## 2016-03-24 DIAGNOSIS — M6281 Muscle weakness (generalized): Secondary | ICD-10-CM | POA: Diagnosis not present

## 2016-03-24 DIAGNOSIS — N3941 Urge incontinence: Secondary | ICD-10-CM | POA: Diagnosis not present

## 2016-03-24 DIAGNOSIS — N8111 Cystocele, midline: Secondary | ICD-10-CM | POA: Diagnosis not present

## 2016-03-24 DIAGNOSIS — N393 Stress incontinence (female) (male): Secondary | ICD-10-CM | POA: Diagnosis not present

## 2016-03-24 DIAGNOSIS — K5909 Other constipation: Secondary | ICD-10-CM | POA: Diagnosis not present

## 2016-04-27 DIAGNOSIS — N3941 Urge incontinence: Secondary | ICD-10-CM | POA: Diagnosis not present

## 2016-04-27 DIAGNOSIS — R278 Other lack of coordination: Secondary | ICD-10-CM | POA: Diagnosis not present

## 2016-04-27 DIAGNOSIS — M6281 Muscle weakness (generalized): Secondary | ICD-10-CM | POA: Diagnosis not present

## 2016-04-27 DIAGNOSIS — K5909 Other constipation: Secondary | ICD-10-CM | POA: Diagnosis not present

## 2016-05-05 ENCOUNTER — Ambulatory Visit: Payer: Self-pay | Admitting: Neurology

## 2016-05-09 ENCOUNTER — Ambulatory Visit (INDEPENDENT_AMBULATORY_CARE_PROVIDER_SITE_OTHER): Payer: Medicare Other | Admitting: Family Medicine

## 2016-05-09 ENCOUNTER — Encounter: Payer: Self-pay | Admitting: Family Medicine

## 2016-05-09 ENCOUNTER — Telehealth: Payer: Self-pay | Admitting: Family Medicine

## 2016-05-09 VITALS — BP 110/70 | HR 73 | Temp 97.7°F | Wt 119.4 lb

## 2016-05-09 DIAGNOSIS — E559 Vitamin D deficiency, unspecified: Secondary | ICD-10-CM

## 2016-05-09 DIAGNOSIS — L03113 Cellulitis of right upper limb: Secondary | ICD-10-CM | POA: Diagnosis not present

## 2016-05-09 DIAGNOSIS — T63481A Toxic effect of venom of other arthropod, accidental (unintentional), initial encounter: Secondary | ICD-10-CM | POA: Insufficient documentation

## 2016-05-09 DIAGNOSIS — W57XXXA Bitten or stung by nonvenomous insect and other nonvenomous arthropods, initial encounter: Secondary | ICD-10-CM

## 2016-05-09 DIAGNOSIS — R748 Abnormal levels of other serum enzymes: Secondary | ICD-10-CM | POA: Diagnosis not present

## 2016-05-09 DIAGNOSIS — Z23 Encounter for immunization: Secondary | ICD-10-CM

## 2016-05-09 DIAGNOSIS — G20 Parkinson's disease: Secondary | ICD-10-CM

## 2016-05-09 DIAGNOSIS — R7989 Other specified abnormal findings of blood chemistry: Secondary | ICD-10-CM

## 2016-05-09 DIAGNOSIS — T148 Other injury of unspecified body region: Secondary | ICD-10-CM | POA: Diagnosis not present

## 2016-05-09 DIAGNOSIS — T63891A Toxic effect of contact with other venomous animals, accidental (unintentional), initial encounter: Secondary | ICD-10-CM

## 2016-05-09 LAB — CBC WITH DIFFERENTIAL/PLATELET
BASOS PCT: 0.6 % (ref 0.0–3.0)
Basophils Absolute: 0 10*3/uL (ref 0.0–0.1)
EOS ABS: 0.1 10*3/uL (ref 0.0–0.7)
EOS PCT: 1.4 % (ref 0.0–5.0)
HCT: 39.1 % (ref 36.0–46.0)
Hemoglobin: 13 g/dL (ref 12.0–15.0)
LYMPHS ABS: 1.2 10*3/uL (ref 0.7–4.0)
Lymphocytes Relative: 17.7 % (ref 12.0–46.0)
MCHC: 33.3 g/dL (ref 30.0–36.0)
MCV: 87.5 fl (ref 78.0–100.0)
MONO ABS: 0.5 10*3/uL (ref 0.1–1.0)
Monocytes Relative: 7.3 % (ref 3.0–12.0)
NEUTROS ABS: 5.1 10*3/uL (ref 1.4–7.7)
NEUTROS PCT: 73 % (ref 43.0–77.0)
PLATELETS: 208 10*3/uL (ref 150.0–400.0)
RBC: 4.47 Mil/uL (ref 3.87–5.11)
RDW: 14.6 % (ref 11.5–15.5)
WBC: 7 10*3/uL (ref 4.0–10.5)

## 2016-05-09 LAB — LIPID PANEL
CHOLESTEROL: 156 mg/dL (ref 0–200)
HDL: 64.8 mg/dL (ref >39.00)
LDL CALC: 73 mg/dL (ref 0–99)
NonHDL: 91.58
Total CHOL/HDL Ratio: 2
Triglycerides: 94 mg/dL (ref 0.0–149.0)
VLDL: 18.8 mg/dL (ref 0.0–40.0)

## 2016-05-09 LAB — COMPREHENSIVE METABOLIC PANEL
ALBUMIN: 4.1 g/dL (ref 3.5–5.2)
ALK PHOS: 59 U/L (ref 39–117)
ALT: 15 U/L (ref 0–35)
AST: 18 U/L (ref 0–37)
BUN: 20 mg/dL (ref 6–23)
CHLORIDE: 106 meq/L (ref 96–112)
CO2: 27 mEq/L (ref 19–32)
Calcium: 9.2 mg/dL (ref 8.4–10.5)
Creatinine, Ser: 0.92 mg/dL (ref 0.40–1.20)
GFR: 64.34 mL/min (ref >60.00)
Glucose, Bld: 55 mg/dL — ABNORMAL LOW (ref 70–99)
POTASSIUM: 3.7 meq/L (ref 3.5–5.1)
SODIUM: 141 meq/L (ref 135–145)
TOTAL PROTEIN: 6.7 g/dL (ref 6.0–8.3)
Total Bilirubin: 0.3 mg/dL (ref 0.2–1.2)

## 2016-05-09 MED ORDER — SULFAMETHOXAZOLE-TRIMETHOPRIM 800-160 MG PO TABS: 1.0000 | ORAL_TABLET | Freq: Two times a day (BID) | ORAL | 0 refills | Status: DC

## 2016-05-09 MED ORDER — ERYTHROMYCIN BASE 500 MG PO TABS: 500.0000 mg | ORAL_TABLET | Freq: Two times a day (BID) | ORAL | 0 refills | Status: DC

## 2016-05-09 NOTE — Telephone Encounter (Signed)
Caller name: [name] from [place]: [id number] - [place], [place] [address] [phone] (Phone) [phone] (Fax)     Reason for call:  Pharmacy states 400 MG in stock for erythromycin base (E-MYCIN) 500 MG tablet, please advise.

## 2016-05-09 NOTE — Assessment & Plan Note (Signed)
Pt did not want steroids con't benadryl abx per orders

## 2016-05-09 NOTE — Patient Instructions (Signed)
Bee, Wasp, or Hornet Sting °Bees, wasps, and hornets are part of a family of insects that can sting people. These stings can cause pain and inflammation, but they are usually not serious. However, some people may have an allergic reaction to a sting. This can cause the symptoms to be more severe.  °SYMPTOMS  °Common symptoms of this condition include:  °· A red lump in the skin that sometimes has a tiny hole in the center. In some cases, a stinger may be in the center of the wound. °· Pain and itching at the sting site. °· Redness and swelling around the sting site. If you have an allergic reaction (localized allergic reaction), the swelling and redness may spread out from the sting site. In some cases, this reaction can continue to develop over the next 12-36 hours. °In rare cases, a person may have a severe allergic reaction (anaphylactic reaction) to a sting. Symptoms of an anaphylactic reaction may include:  °· Wheezing or difficulty breathing. °· Raised, itchy, red patches on the skin. °· Nausea or vomiting. °· Abdominal cramping. °· Diarrhea. °· Chest pain. °· Fainting. °· Redness of the face (flushing). °DIAGNOSIS  °This condition is usually diagnosed based on symptoms, medical history, and a physical exam. °TREATMENT  °Most stings can be treated with:  °· Icing to reduce swelling. °· Medicines (antihistamines) to treat itching or an allergic reaction. °· Medicines to help reduce pain. These may be medicines that you take by mouth, or medicated creams or lotions that you apply to your skin. °If you were stung by a bee, the stinger and a small sac of poison may be in the wound. This may be removed by brushing across it with a flat card, such as a credit card. Another method is to pinch the area and pull it out. These methods can help reduce the severity of the body's reaction to the sting.  °HOME CARE INSTRUCTIONS  °· Wash the sting site daily with soap and water as told by your health care provider. °· Apply  or take over-the-counter and prescription medicines only as told by your health care provider. °· If directed, apply ice to the sting area. °¨  Put ice in a plastic bag. °¨  Place a towel between your skin and the bag. °¨  Leave the ice on for 20 minutes, 2-3 times per day. °· Do not scratch the sting area. °· To lessen pain, try using a paste that is made of water and baking soda. Rub the paste on the sting area and leave it on for 5 minutes. °· If you had a severe allergic reaction to a sting, you may need: °¨  To wear a medical bracelet or necklace that lists the allergy. °¨  To learn when and how to use an anaphylaxis kit or epinephrine injection. Your family members may also need to learn this. °¨  To carry an anaphylaxis kit with you at all times. °SEEK MEDICAL CARE IF:  °· Your symptoms do not get better in 2-3 days. °· You have redness, swelling, or pain that spreads beyond the area of the sting. °· You have a fever. °SEEK IMMEDIATE MEDICAL CARE IF:  °You have symptoms of a severe allergic reaction. These include:  °· Wheezing or difficulty breathing. °· Chest pain. °· Light-headedness or fainting. °· Itchy, raised, red patches on the skin. °· Nausea or vomiting. °· Abdominal cramping. °· Diarrhea. °  °This information is not intended to replace advice given to you by your health care provider.   Make sure you discuss any questions you have with your health care provider. °  °Document Released: 09/05/2005 Document Revised: 05/27/2015 Document Reviewed: 01/21/2015 °Elsevier Interactive Patient Education ©2016 Elsevier Inc. ° °

## 2016-05-09 NOTE — Addendum Note (Signed)
Addended by: Ewing Schlein on: 05/09/2016 03:07 PM   Modules accepted: Orders

## 2016-05-09 NOTE — Telephone Encounter (Signed)
yes

## 2016-05-09 NOTE — Telephone Encounter (Signed)
Pharmacy called again to follow up on the Erythromycin. Plse adv.

## 2016-05-09 NOTE — Progress Notes (Signed)
Pre visit review using our clinic review tool, if applicable. No additional management support is needed unless otherwise documented below in the visit note. 

## 2016-05-09 NOTE — Progress Notes (Signed)
Patient ID: Amanda [name], female    DOB: [birth year]  Age: 70 y.o. MRN: [id number]    Subjective:  Subjective  HPI Amanda [name] presents for wasp sting on R hand --- after 12 yesterday.   It is better today.  She was taking benadryl  Review of Systems  Constitutional: Negative for activity change, appetite change, fatigue and unexpected weight change.  Respiratory: Negative for cough and shortness of breath.   Cardiovascular: Negative for chest pain and palpitations.  Skin: Positive for color change and rash.  Neurological: Positive for tremors.  Psychiatric/Behavioral: Negative for behavioral problems and dysphoric mood. The patient is not nervous/anxious.     History Past Medical History:  Diagnosis Date  . Frequency of urination   . GAD (generalized anxiety disorder)   . Osteopenia   . Parkinson disease Amanda [name])    neurologist-  dr Arvella Nigh--  right side predominant  . Prolapse of vaginal wall   . Urgency of urination   . Wears glasses     She has a past surgical history that includes Laparoscopic ovarian cystectomy (March 2008); External ear surgery (Bilateral, age 64); Colonoscopy (last one 2007); Esophagogastroduodenoscopy (2007); Cystoscopy w/ ureteral stent placement (Bilateral, 01/11/2016); Cystocele repair (N/A, 01/11/2016); Pubovaginal sling (N/A, 01/11/2016); Vaginal hysterectomy (N/A, 01/11/2016); and Rectocele repair (N/A, 01/11/2016).   Her family history includes COPD in her sister; Cancer in her father; Heart disease in her brother and mother; Polycystic kidney disease in her son; Pulmonary embolism in her sister.She reports that she has never smoked. She has never used smokeless tobacco. She reports that she does not drink alcohol or use drugs.  Current Outpatient Prescriptions on File Prior to Visit  Medication Sig Dispense Refill  . b complex vitamins tablet Take 1 tablet by mouth daily. MEGA FOOD BALANCE SOURCE    . Cholecalciferol (VITAMIN D3) 1000 UNIT/SPRAY LIQD  Take 2 sprays by mouth 2 (two) times daily.     . NON FORMULARY Apply topically 2 (two) times daily. Progesterone 3% and Bi-est Cream 0.1mg  (these are compound separate cream that pt mixes together and apply's  Twice daily  to arm and thigh    . NON FORMULARY Grow Bone Raw Calcium/K+/ Magnesium/ D--   2 caps twice a day    . Nutritional Supplements (DHEA PO) Take 5 mg by mouth at bedtime.     . pramipexole (MIRAPEX) 0.75 MG tablet 0.75 mg 3 (three) times daily.  4  . Probiotic Product (PROBIOTIC DAILY) CAPS Take by mouth as needed. Healthy Intestinal Flora     No current facility-administered medications on file prior to visit.      Objective:  Objective  Physical Exam  Constitutional: She is oriented to person, place, and time. She appears well-developed and well-nourished.  HENT:  Head: Normocephalic and atraumatic.  Eyes: Conjunctivae and EOM are normal.  Neck: Normal range of motion. Neck supple. No JVD present. Carotid bruit is not present. No thyromegaly present.  Cardiovascular: Normal rate, regular rhythm and normal heart sounds.   No murmur heard. Pulmonary/Chest: Effort normal and breath sounds normal. No respiratory distress. She has no wheezes. She has no rales. She exhibits no tenderness.  Musculoskeletal: She exhibits no edema.  Neurological: She is alert and oriented to person, place, and time.  Skin: Skin is warm. Rash noted. There is erythema.     Psychiatric: She has a normal mood and affect.  Nursing note and vitals reviewed.  BP 110/70 (BP Location: Left Arm, Patient Position: Sitting,  Cuff Size: Normal)   Pulse 73   Temp 97.7 F (36.5 C) (Oral)   Wt 119 lb 6.4 oz (54.2 kg)   SpO2 97%   BMI 20.66 kg/m  Wt Readings from Last 3 Encounters:  05/09/16 119 lb 6.4 oz (54.2 kg)  03/04/16 120 lb 3.2 oz (54.5 kg)  01/11/16 126 lb (57.2 kg)     Lab Results  Component Value Date   WBC 10.6 (H) 01/13/2016   HGB 9.0 (L) 01/13/2016   HCT 27.2 (L) 01/13/2016    PLT 144 (L) 01/13/2016   GLUCOSE 108 (H) 01/13/2016   CHOL 164 03/05/2015   TRIG 78.0 03/05/2015   HDL 72.70 03/05/2015   LDLCALC 76 03/05/2015   ALT 17 03/05/2015   AST 20 03/05/2015   NA 133 (L) 01/13/2016   K 4.2 01/13/2016   CL 104 01/13/2016   CREATININE 1.87 (H) 01/13/2016   BUN 24 (H) 01/13/2016   CO2 23 01/13/2016   TSH 1.00 08/17/2011    US Renal  Result Date: 01/12/2016 CLINICAL DATA:  Left flank pain and rising creatinine after pelvic surgery. EXAM: RENAL / URINARY TRACT ULTRASOUND COMPLETE COMPARISON:  CT abdomen and pelvis 11/19/2015 FINDINGS: Right Kidney: Length: 10.4 cm. Echogenicity within normal limits. No mass. Mild hydronephrosis. Left Kidney: Length: 10.5 cm. Echogenicity within normal limits. No mass. Mild hydronephrosis. Small amount of perinephric fluid. Bladder: Small amount of debris dependently in the bladder. A right ureteral jet was documented, however no left ureteral jet was visualized. IMPRESSION: 1. Mild bilateral hydronephrosis. 2. Small amount of left perinephric fluid. 3. Bladder debris. 4. Lack of visualization of a left ureteral jet. Ureteral obstruction not excluded. Electronically Signed   By: Logan Bores M.D.   On: 01/12/2016 12:36   Dg C-arm 1-60 Min-no Report  Result Date: 01/11/2016 CLINICAL DATA: cysto retrograde/pyelogram C-ARM 1-60 MINUTES Fluoroscopy was utilized by the requesting physician.  No radiographic interpretation.     Assessment & Plan:  Plan  I am having Amanda [name] start on erythromycin base. I am also having her maintain her NON FORMULARY, b complex vitamins, Nutritional Supplements (DHEA PO), Vitamin D3, pramipexole, NON FORMULARY, and PROBIOTIC DAILY.  Meds ordered this encounter  Medications  . erythromycin base (E-MYCIN) 500 MG tablet    Sig: Take 1 tablet (500 mg total) by mouth 2 (two) times daily.    Dispense:  20 tablet    Refill:  0    Problem List Items Addressed This Visit      Unprioritized   Insect  sting    Pt did not want steroids con't benadryl abx per orders        Other Visit Diagnoses    Parkinson disease ([place])    -  Primary   Relevant Orders   Comprehensive metabolic panel   Lipid panel   CBC with Differential/Platelet   Insect bite       Relevant Medications   erythromycin base (E-MYCIN) 500 MG tablet   Elevated serum creatinine       Relevant Orders   Comprehensive metabolic panel   Lipid panel   CBC with Differential/Platelet   Right arm cellulitis       Relevant Medications   erythromycin base (E-MYCIN) 500 MG tablet   Low serum vitamin D       Vitamin D deficiency       Relevant Orders   Vitamin D (25 hydroxy)      Follow-up: Return if symptoms worsen or fail  to improve.  Ann Held, DO

## 2016-05-09 NOTE — Telephone Encounter (Signed)
Call from the pharmacy and they have 400 mg and not the 500 mg and would like to know if it is ok to switch? Please advise     KP

## 2016-05-09 NOTE — Telephone Encounter (Signed)
Rx is going to cost the patient $100 dollars and the pharmacist is asking to switch it to something else. Per Dr.Lowne Bactrim DS bid x's 10 days. Rx faxed.    KP

## 2016-05-10 DIAGNOSIS — G20 Parkinson's disease: Secondary | ICD-10-CM | POA: Diagnosis not present

## 2016-05-10 LAB — VITAMIN D 25 HYDROXY (VIT D DEFICIENCY, FRACTURES): VITD: 57.84 ng/mL (ref 30.00–100.00)

## 2016-05-18 DIAGNOSIS — N3946 Mixed incontinence: Secondary | ICD-10-CM | POA: Diagnosis not present

## 2016-05-18 DIAGNOSIS — M6281 Muscle weakness (generalized): Secondary | ICD-10-CM | POA: Diagnosis not present

## 2016-05-18 DIAGNOSIS — R278 Other lack of coordination: Secondary | ICD-10-CM | POA: Diagnosis not present

## 2016-08-30 DIAGNOSIS — G20 Parkinson's disease: Secondary | ICD-10-CM | POA: Diagnosis not present

## 2016-08-30 DIAGNOSIS — Z7189 Other specified counseling: Secondary | ICD-10-CM | POA: Diagnosis not present

## 2016-08-30 DIAGNOSIS — Z880 Allergy status to penicillin: Secondary | ICD-10-CM | POA: Diagnosis not present

## 2016-08-30 DIAGNOSIS — F411 Generalized anxiety disorder: Secondary | ICD-10-CM | POA: Diagnosis not present

## 2016-08-30 DIAGNOSIS — Z79899 Other long term (current) drug therapy: Secondary | ICD-10-CM | POA: Diagnosis not present

## 2016-09-14 DIAGNOSIS — J019 Acute sinusitis, unspecified: Secondary | ICD-10-CM | POA: Diagnosis not present

## 2016-09-19 HISTORY — PX: MASTECTOMY: SHX3

## 2016-09-24 IMAGING — US US RENAL
1 series · 14 of 25 positions shown · non-contrast
Comparison: CT abdomen and pelvis 11/19/2015

CLINICAL DATA: Left flank pain and rising creatinine after pelvic
surgery.

EXAM:
RENAL / URINARY TRACT ULTRASOUND COMPLETE

[Series 1: us renal · 0.23mm/px · 14 of 42 slices shown]
[im 1/42]
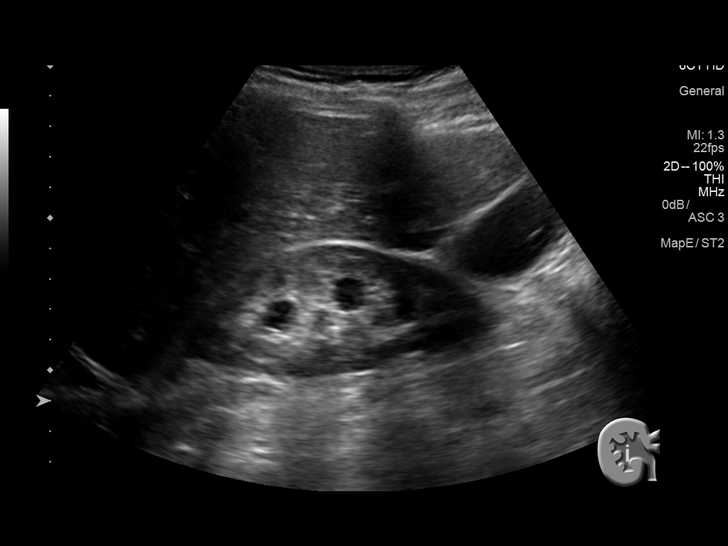
[im 4/42]
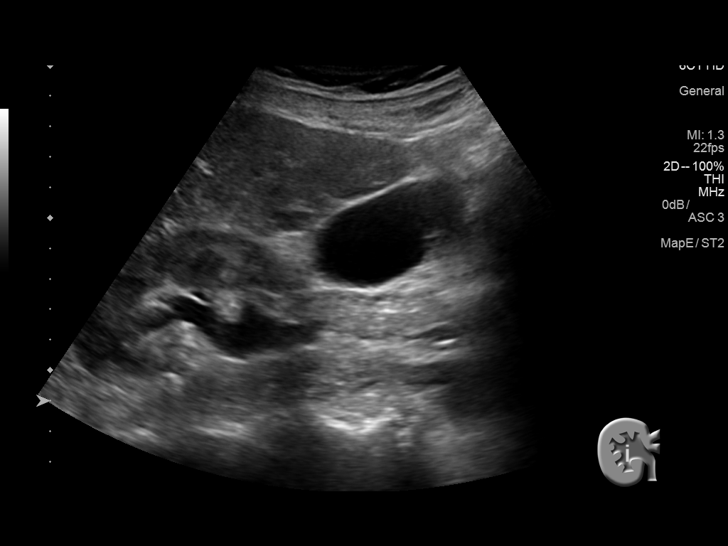
[im 7/42]
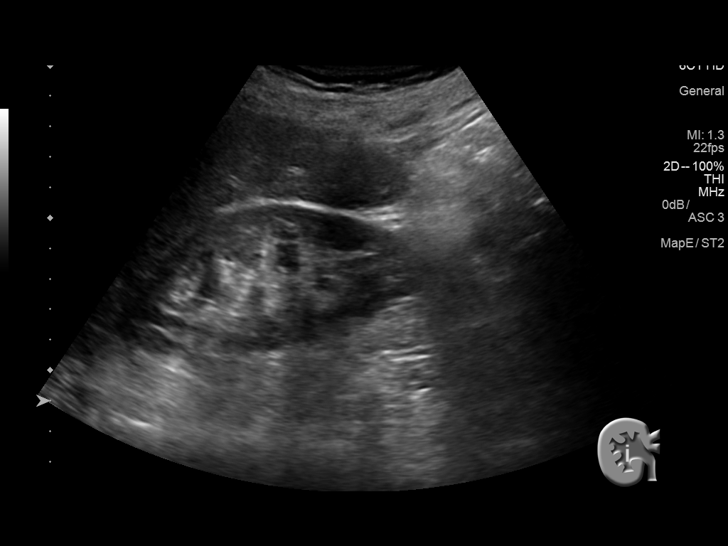
[im 11/42]
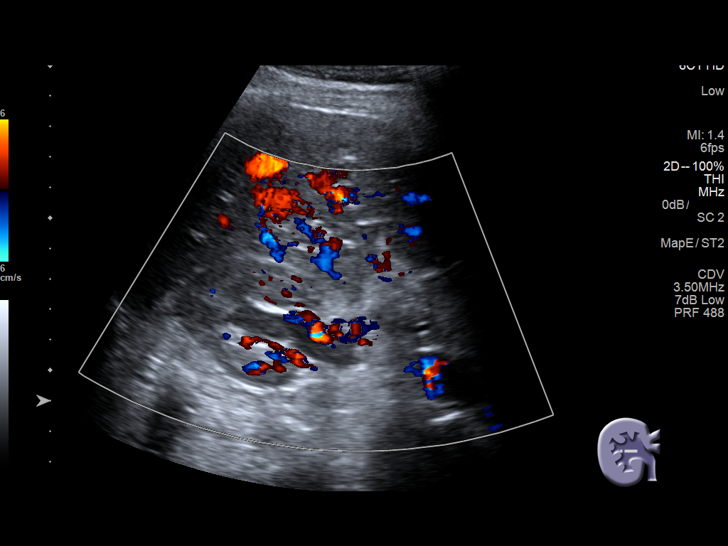
[im 14/42]
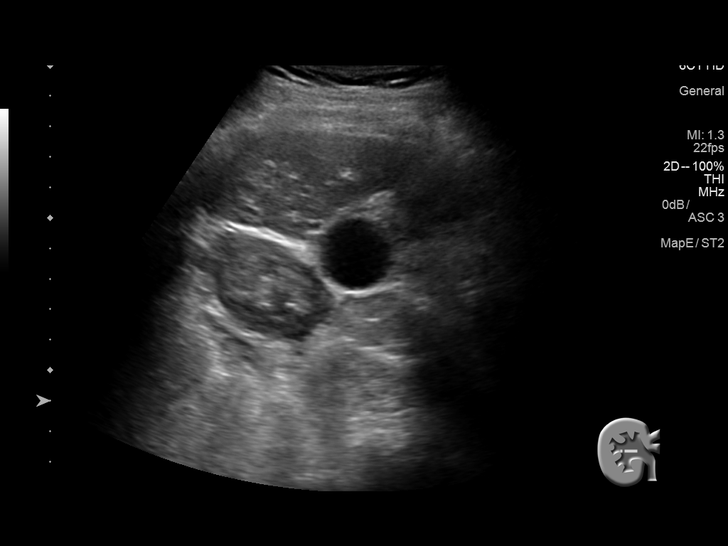
[im 16/42]
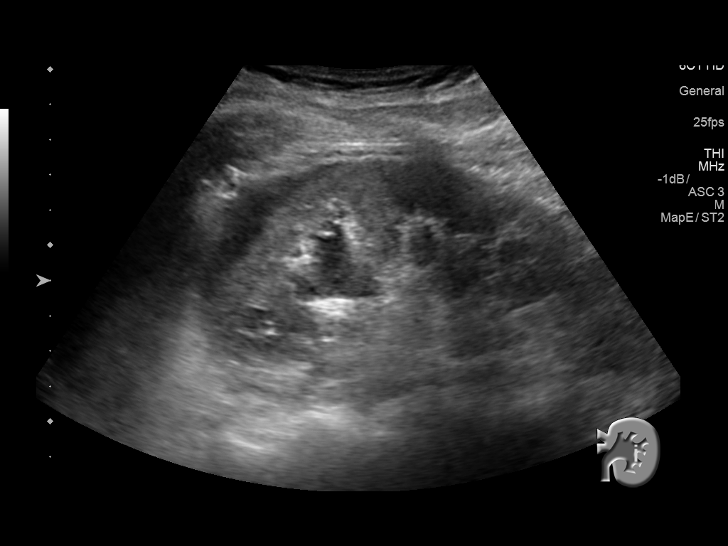
[im 19/42]
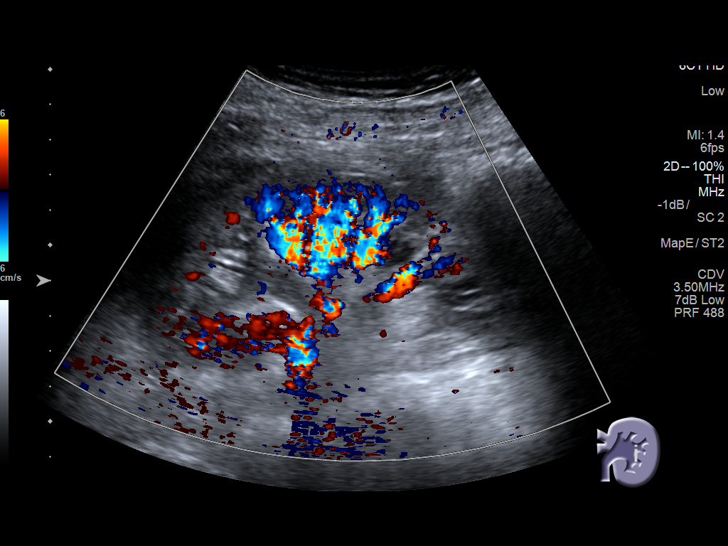
[im 23/42]
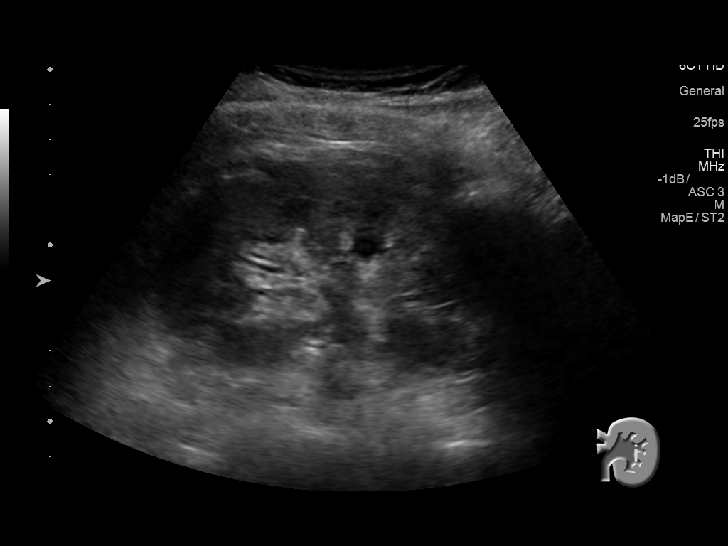
[im 26/42]
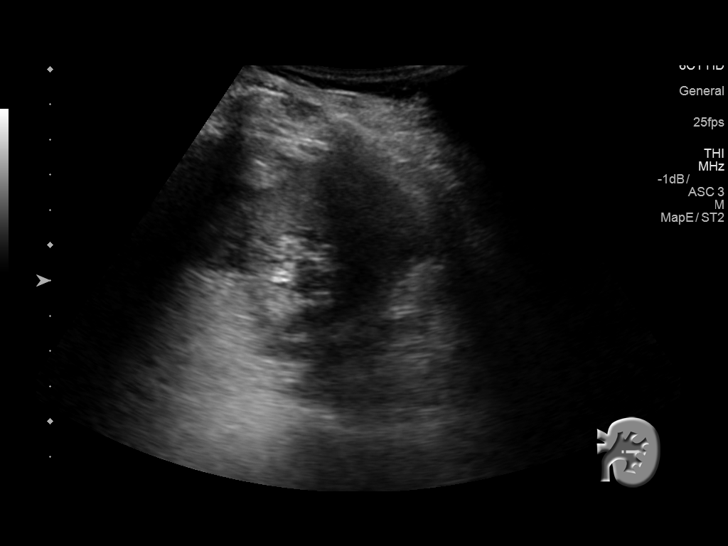
[im 28/42]
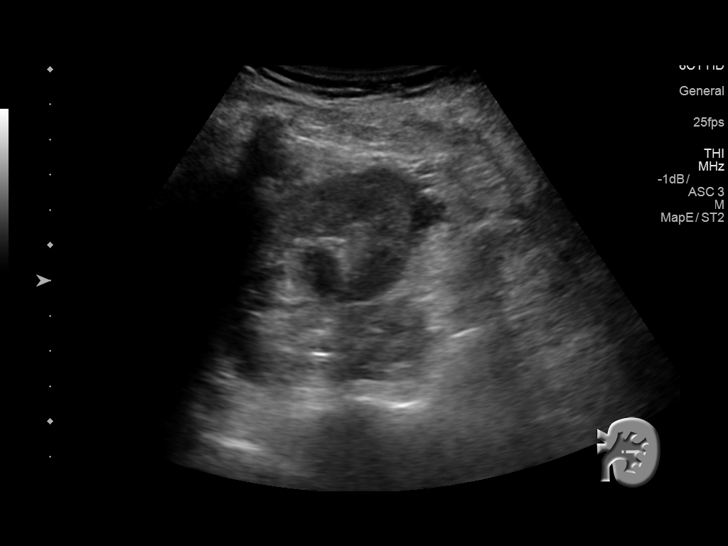
[im 31/42]
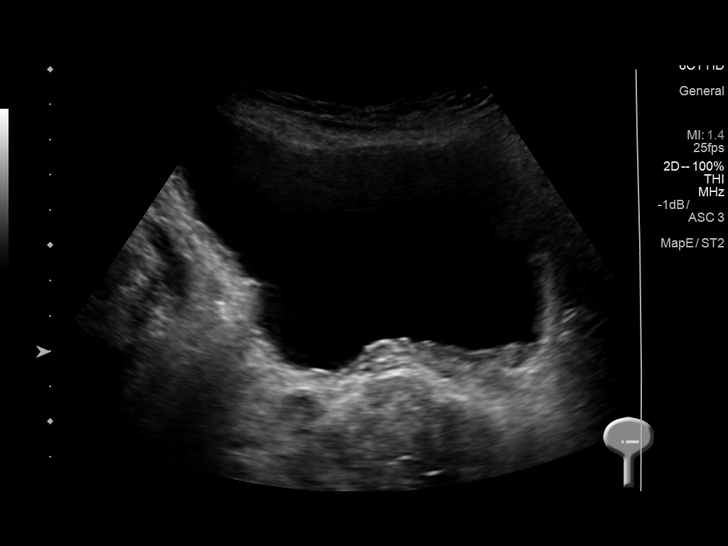
[im 35/42]
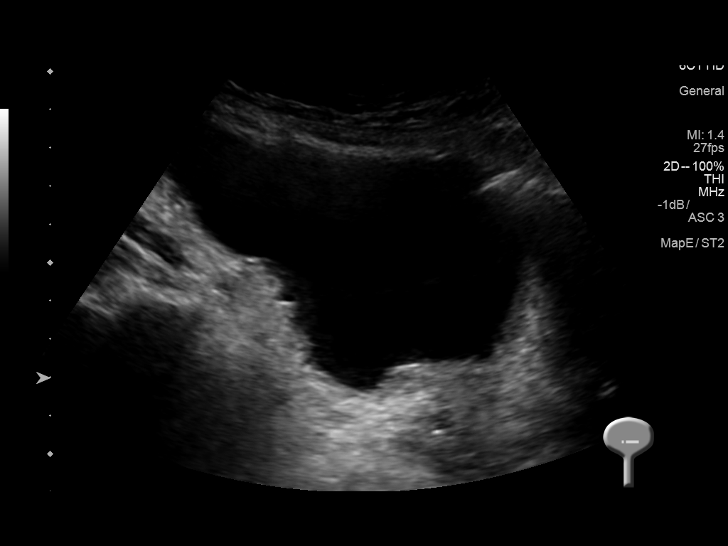
[im 38/42]
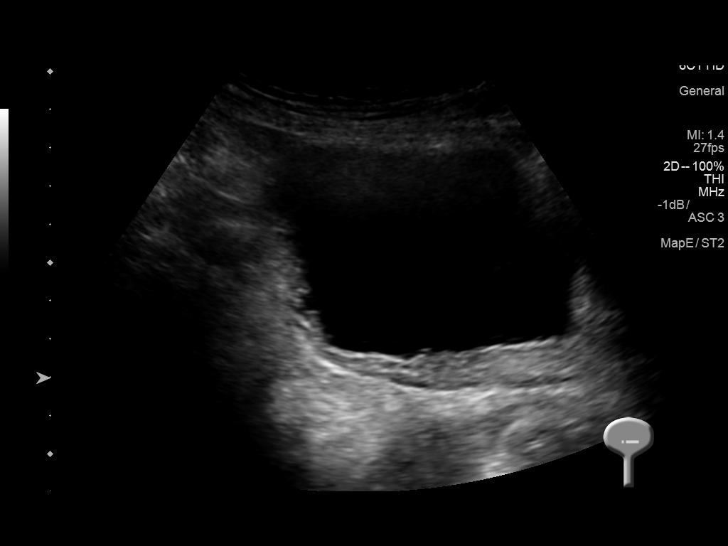
[im 42/42]
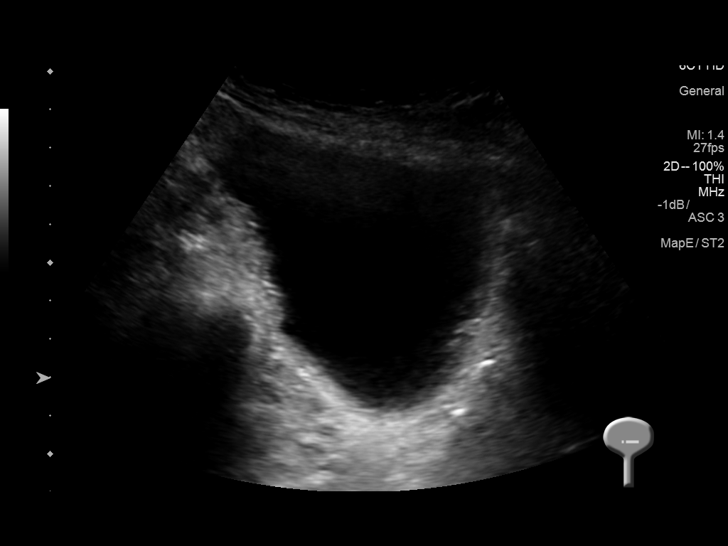

[14 of 25 positions shown; findings below may reference images not displayed]

FINDINGS: Right Kidney:

Length: 10.4 cm. Echogenicity within normal limits. No mass. Mild
hydronephrosis.

Left Kidney:

Length: 10.5 cm. Echogenicity within normal limits. No mass. Mild
hydronephrosis. Small amount of perinephric fluid.

Bladder:

Small amount of debris dependently in the bladder. A right ureteral
jet was documented, however no left ureteral jet was visualized.
IMPRESSION: 1. Mild bilateral hydronephrosis.
2. Small amount of left perinephric fluid.
3. Bladder debris.
4. Lack of visualization of a left ureteral jet. Ureteral
obstruction not excluded.

## 2016-11-22 DIAGNOSIS — Z79899 Other long term (current) drug therapy: Secondary | ICD-10-CM | POA: Diagnosis not present

## 2016-11-22 DIAGNOSIS — Z1272 Encounter for screening for malignant neoplasm of vagina: Secondary | ICD-10-CM | POA: Diagnosis not present

## 2016-11-22 DIAGNOSIS — Z124 Encounter for screening for malignant neoplasm of cervix: Secondary | ICD-10-CM | POA: Diagnosis not present

## 2016-11-22 DIAGNOSIS — Z6822 Body mass index (BMI) 22.0-22.9, adult: Secondary | ICD-10-CM | POA: Diagnosis not present

## 2016-11-22 DIAGNOSIS — Z880 Allergy status to penicillin: Secondary | ICD-10-CM | POA: Diagnosis not present

## 2016-11-22 DIAGNOSIS — N958 Other specified menopausal and perimenopausal disorders: Secondary | ICD-10-CM | POA: Diagnosis not present

## 2016-11-22 DIAGNOSIS — G20 Parkinson's disease: Secondary | ICD-10-CM | POA: Diagnosis not present

## 2016-11-22 DIAGNOSIS — F419 Anxiety disorder, unspecified: Secondary | ICD-10-CM | POA: Diagnosis not present

## 2016-11-22 DIAGNOSIS — M816 Localized osteoporosis [Lequesne]: Secondary | ICD-10-CM | POA: Diagnosis not present

## 2017-03-15 DIAGNOSIS — L918 Other hypertrophic disorders of the skin: Secondary | ICD-10-CM | POA: Diagnosis not present

## 2017-03-15 DIAGNOSIS — B351 Tinea unguium: Secondary | ICD-10-CM | POA: Diagnosis not present

## 2017-03-15 DIAGNOSIS — L738 Other specified follicular disorders: Secondary | ICD-10-CM | POA: Diagnosis not present

## 2017-03-15 DIAGNOSIS — L720 Epidermal cyst: Secondary | ICD-10-CM | POA: Diagnosis not present

## 2017-03-15 DIAGNOSIS — D1801 Hemangioma of skin and subcutaneous tissue: Secondary | ICD-10-CM | POA: Diagnosis not present

## 2017-03-15 DIAGNOSIS — L821 Other seborrheic keratosis: Secondary | ICD-10-CM | POA: Diagnosis not present

## 2017-03-15 DIAGNOSIS — L814 Other melanin hyperpigmentation: Secondary | ICD-10-CM | POA: Diagnosis not present

## 2017-03-15 DIAGNOSIS — B353 Tinea pedis: Secondary | ICD-10-CM | POA: Diagnosis not present

## 2017-04-21 DIAGNOSIS — H2513 Age-related nuclear cataract, bilateral: Secondary | ICD-10-CM | POA: Diagnosis not present

## 2017-05-02 ENCOUNTER — Ambulatory Visit (INDEPENDENT_AMBULATORY_CARE_PROVIDER_SITE_OTHER): Payer: Medicare Other | Admitting: Family Medicine

## 2017-05-02 ENCOUNTER — Ambulatory Visit: Payer: Self-pay | Admitting: *Deleted

## 2017-05-02 ENCOUNTER — Encounter: Payer: Self-pay | Admitting: Family Medicine

## 2017-05-02 VITALS — BP 122/82 | HR 66 | Temp 98.5°F | Ht 64.0 in | Wt 126.0 lb

## 2017-05-02 DIAGNOSIS — B351 Tinea unguium: Secondary | ICD-10-CM

## 2017-05-02 DIAGNOSIS — M21611 Bunion of right foot: Secondary | ICD-10-CM | POA: Diagnosis not present

## 2017-05-02 DIAGNOSIS — Z0000 Encounter for general adult medical examination without abnormal findings: Secondary | ICD-10-CM | POA: Diagnosis not present

## 2017-05-02 DIAGNOSIS — R600 Localized edema: Secondary | ICD-10-CM

## 2017-05-02 MED ORDER — HYDROCHLOROTHIAZIDE 25 MG PO TABS: 25.0000 mg | ORAL_TABLET | Freq: Every day | ORAL | 3 refills | Status: DC

## 2017-05-02 NOTE — Progress Notes (Signed)
Patient ID: Amanda [name], female    DOB: Oct 05, 1945  Age: 71 y.o. MRN: [id number]    Subjective:  Subjective  HPI Amanda [name] presents for edema low ext and she c/o bunions, corns and fungus on both feet and she is requesting a referral to podiatry. No sob, no cp.   Review of Systems  Constitutional: Negative for activity change, appetite change, fatigue and unexpected weight change.  Respiratory: Negative for cough and shortness of breath.   Cardiovascular: Positive for leg swelling. Negative for chest pain and palpitations.  Psychiatric/Behavioral: Negative for behavioral problems and dysphoric mood. The patient is not nervous/anxious.     History Past Medical History:  Diagnosis Date  . Frequency of urination   . GAD (generalized anxiety disorder)   . Osteopenia   . Parkinson disease [facility])    neurologist-  dr Arvella Nigh--  right side predominant  . Prolapse of vaginal wall   . Urgency of urination   . Wears glasses     She has a past surgical history that includes Laparoscopic ovarian cystectomy (March 2008); External ear surgery (Bilateral, age 46); Colonoscopy (last one 2007); Esophagogastroduodenoscopy (2007); Cystoscopy w/ ureteral stent placement (Bilateral, 01/11/2016); Cystocele repair (N/A, 01/11/2016); Pubovaginal sling (N/A, 01/11/2016); Vaginal hysterectomy (N/A, 01/11/2016); and Rectocele repair (N/A, 01/11/2016).   Her family history includes COPD in her sister; Cancer in her father; Edema in her sister; Heart disease in her brother and mother; Polycystic kidney disease in her son; Pulmonary embolism in her sister.She reports that she has never smoked. She has never used smokeless tobacco. She reports that she does not drink alcohol or use drugs.  Current Outpatient Prescriptions on File Prior to Visit  Medication Sig Dispense Refill  . b complex vitamins tablet Take 1 tablet by mouth daily. MEGA FOOD BALANCE SOURCE    . Cholecalciferol (VITAMIN D3) 1000 UNIT/SPRAY  LIQD Take 2 sprays by mouth 4 (four) times daily.     . NON FORMULARY Apply topically 2 (two) times daily. Progesterone 3% and Bi-est Cream 0.1mg  (these are compound separate cream that pt mixes together and apply's  Twice daily  to arm and thigh    . NON FORMULARY Grow Bone Raw Calcium/K+/ Magnesium/ D--   2 caps twice a day    . Nutritional Supplements (DHEA PO) Take 5 mg by mouth at bedtime.     . Probiotic Product (PROBIOTIC DAILY) CAPS Take by mouth as needed. Healthy Intestinal Flora    . pramipexole (MIRAPEX) 0.75 MG tablet 0.75 mg 3 (three) times daily.  4   No current facility-administered medications on file prior to visit.      Objective:  Objective  Physical Exam  Constitutional: She is oriented to person, place, and time. She appears well-developed and well-nourished.  HENT:  Head: Normocephalic and atraumatic.  Eyes: Conjunctivae and EOM are normal.  Neck: Normal range of motion. Neck supple. No JVD present. Carotid bruit is not present. No thyromegaly present.  Cardiovascular: Normal rate, regular rhythm and normal heart sounds.   No murmur heard. Pulmonary/Chest: Effort normal and breath sounds normal. No respiratory distress. She has no wheezes. She has no rales. She exhibits no tenderness.  Musculoskeletal: She exhibits edema.       Right ankle: She exhibits swelling.       Left ankle: She exhibits swelling.       Feet:  Neurological: She is alert and oriented to person, place, and time.  Psychiatric: She has a normal mood and affect.  Her behavior is normal. Judgment and thought content normal.  Nursing note and vitals reviewed.  BP 122/82 (BP Location: Right Arm, Patient Position: Sitting, Cuff Size: Normal)   Pulse 66   Temp 98.5 F (36.9 C) (Oral)   Ht 5\' 4"  (1.626 m)   Wt 126 lb (57.2 kg)   SpO2 99%   BMI 21.63 kg/m  Wt Readings from Last 3 Encounters:  05/02/17 126 lb (57.2 kg)  05/09/16 119 lb 6.4 oz (54.2 kg)  03/04/16 120 lb 3.2 oz (54.5 kg)      Lab Results  Component Value Date   WBC 7.0 05/09/2016   HGB 13.0 05/09/2016   HCT 39.1 05/09/2016   PLT 208.0 05/09/2016   GLUCOSE 55 (L) 05/09/2016   CHOL 156 05/09/2016   TRIG 94.0 05/09/2016   HDL 64.80 05/09/2016   LDLCALC 73 05/09/2016   ALT 15 05/09/2016   AST 18 05/09/2016   NA 141 05/09/2016   K 3.7 05/09/2016   CL 106 05/09/2016   CREATININE 0.92 05/09/2016   BUN 20 05/09/2016   CO2 27 05/09/2016   TSH 1.00 08/17/2011    US Renal  Result Date: 01/12/2016 CLINICAL DATA:  Left flank pain and rising creatinine after pelvic surgery. EXAM: RENAL / URINARY TRACT ULTRASOUND COMPLETE COMPARISON:  CT abdomen and pelvis 11/19/2015 FINDINGS: Right Kidney: Length: 10.4 cm. Echogenicity within normal limits. No mass. Mild hydronephrosis. Left Kidney: Length: 10.5 cm. Echogenicity within normal limits. No mass. Mild hydronephrosis. Small amount of perinephric fluid. Bladder: Small amount of debris dependently in the bladder. A right ureteral jet was documented, however no left ureteral jet was visualized. IMPRESSION: 1. Mild bilateral hydronephrosis. 2. Small amount of left perinephric fluid. 3. Bladder debris. 4. Lack of visualization of a left ureteral jet. Ureteral obstruction not excluded. Electronically Signed   By: Logan Bores M.D.   On: 01/12/2016 12:36   Dg C-arm 1-60 Min-no Report  Result Date: 01/11/2016 CLINICAL DATA: cysto retrograde/pyelogram C-ARM 1-60 MINUTES Fluoroscopy was utilized by the requesting physician.  No radiographic interpretation.     Assessment & Plan:  Plan  I have discontinued Ms. Kerwin's erythromycin base and sulfamethoxazole-trimethoprim. I am also having her start on hydrochlorothiazide. Additionally, I am having her maintain her NON FORMULARY, b complex vitamins, Nutritional Supplements (DHEA PO), Vitamin D3, pramipexole, NON FORMULARY, PROBIOTIC DAILY, and ketoconazole.  Meds ordered this encounter  Medications  . hydrochlorothiazide  (HYDRODIURIL) 25 MG tablet    Sig: Take 1 tablet (25 mg total) by mouth daily.    Dispense:  30 tablet    Refill:  3  . ketoconazole (NIZORAL) 2 % cream    Sig: Apply 1 application topically 2 (two) times daily as needed.    Refill:  0    Problem List Items Addressed This Visit      Unprioritized   Edema, lower extremity    Elevate legs  Drink water hctz  rto 2-3 weeks      Relevant Medications   hydrochlorothiazide (HYDRODIURIL) 25 MG tablet   Other Relevant Orders   Basic metabolic panel    Other Visit Diagnoses    Onychomycosis    -  Primary   Relevant Medications   ketoconazole (NIZORAL) 2 % cream   Other Relevant Orders   Ambulatory referral to Podiatry   Bunion of great toe of right foot       Relevant Orders   Ambulatory referral to Podiatry   Encounter for Medicare annual wellness exam  Follow-up: Return in about 2 weeks (around 05/16/2017), or f/u edema.  Ann Held, DO

## 2017-05-02 NOTE — Patient Instructions (Addendum)
Edema Edema is when you have too much fluid in your body or under your skin. Edema may make your legs, feet, and ankles swell up. Swelling is also common in looser tissues, like around your eyes. This is a common condition. It gets more common as you get older. There are many possible causes of edema. Eating too much salt (sodium) and being on your feet or sitting for a long time can cause edema in your legs, feet, and ankles. Hot weather may make edema worse. Edema is usually painless. Your skin may look swollen or shiny. Follow these instructions at home:  Keep the swollen body part raised (elevated) above the level of your heart when you are sitting or lying down.  Do not sit still or stand for a long time.  Do not wear tight clothes. Do not wear garters on your upper legs.  Exercise your legs. This can help the swelling go down.  Wear elastic bandages or support stockings as told by your doctor.  Eat a low-salt (low-sodium) diet to reduce fluid as told by your doctor.  Depending on the cause of your swelling, you may need to limit how much fluid you drink (fluid restriction).  Take over-the-counter and prescription medicines only as told by your doctor. Contact a doctor if:  Treatment is not working.  You have heart, liver, or kidney disease and have symptoms of edema.  You have sudden and unexplained weight gain. Get help right away if:  You have shortness of breath or chest pain.  You cannot breathe when you lie down.  You have pain, redness, or warmth in the swollen areas.  You have heart, liver, or kidney disease and get edema all of a sudden.  You have a fever and your symptoms get worse all of a sudden. Summary  Edema is when you have too much fluid in your body or under your skin.  Edema may make your legs, feet, and ankles swell up. Swelling is also common in looser tissues, like around your eyes.  Raise (elevate) the swollen body part above the level of your  heart when you are sitting or lying down.  Follow your doctor's instructions about diet and how much fluid you can drink (fluid restriction). This information is not intended to replace advice given to you by your health care provider. Make sure you discuss any questions you have with your health care provider. Document Released: 02/22/2008 Document Revised: 09/23/2016 Document Reviewed: 09/23/2016 Elsevier Interactive Patient Education  2017 [place]. [place] , Thank you for taking time to come for your Medicare Wellness Visit. I appreciate your ongoing commitment to your health goals. Please review the following plan we discussed and let me know if I can assist you in the future.   Bring a copy of your advance directives to your next office visit.  Call the [facility] to schedule your mammogram at [phone].  We will order Cologuard to your house.  These are the goals we discussed: Goals    . Increase physical activity (pt-stated)          Starting 05/02/2017, I will start doing Parkinson's exercises with my husband and using the stationary bike 2-3 times weekly.       This is a list of the screening recommended for you and due dates:  Health Maintenance  Topic Date Due  .  Hepatitis C: One time screening is recommended by Center for Disease Control  (CDC) for  adults born from [birth year] through 1964.   1946-06-08  . Colon Cancer Screening  06/23/2014  . Mammogram  11/09/2016  . Flu Shot  04/19/2017  . Tetanus Vaccine  05/09/2026  . DEXA scan (bone density measurement)  Completed  . Pneumonia vaccines  Completed   Preventive Care for Adults  A healthy lifestyle and preventive care can promote health and wellness. Preventive health guidelines for adults include the following key practices.  . A routine yearly physical is a good way to check with your health care provider about your health and preventive screening. It is a chance to share any concerns and updates on  your health and to receive a thorough exam.  . Visit your dentist for a routine exam and preventive care every 6 months. Brush your teeth twice a day and floss once a day. Good oral hygiene prevents tooth decay and gum disease.  . The frequency of eye exams is based on your age, health, family medical history, use  of contact lenses, and other factors. Follow your health care provider's ecommendations for frequency of eye exams.  . Eat a healthy diet. Foods like vegetables, fruits, whole grains, low-fat dairy products, and lean protein foods contain the nutrients you need without too many calories. Decrease your intake of foods high in solid fats, added sugars, and salt. Eat the right amount of calories for you. Get information about a proper diet from your health care provider, if necessary.  . Regular physical exercise is one of the most important things you can do for your health. Most adults should get at least 150 minutes of moderate-intensity exercise (any activity that increases your heart rate and causes you to sweat) each week. In addition, most adults need muscle-strengthening exercises on 2 or more days a week.  Silver Sneakers may be a benefit available to you. To determine eligibility, you may visit the website: www.silversneakers.com or contact program at (646)521-1428 Mon-Fri between 8AM-8PM.   . Maintain a healthy weight. The body mass index (BMI) is a screening tool to identify possible weight problems. It provides an estimate of body fat based on height and weight. Your health care provider can find your BMI and can help you achieve or maintain a healthy weight.   For adults 20 years and older: ? A BMI below 18.5 is considered underweight. ? A BMI of 18.5 to 24.9 is normal. ? A BMI of 25 to 29.9 is considered overweight. ? A BMI of 30 and above is considered obese.   . Maintain normal blood lipids and cholesterol levels by exercising and minimizing your intake of saturated  fat. Eat a balanced diet with plenty of fruit and vegetables. Blood tests for lipids and cholesterol should begin at age 53 and be repeated every 5 years. If your lipid or cholesterol levels are high, you are over 50, or you are at high risk for heart disease, you may need your cholesterol levels checked more frequently. Ongoing high lipid and cholesterol levels should be treated with medicines if diet and exercise are not working.  . If you smoke, find out from your health care provider how to quit. If you do not use tobacco, please do not start.  . If you choose to drink alcohol, please do not consume more than 2 drinks per day. One drink is considered to be 12 ounces (355 mL) of beer, 5 ounces (148 mL) of wine, or 1.5 ounces (44 mL) of liquor.  . If you are 109-79  years old, ask your health care provider if you should take aspirin to prevent strokes.  . Use sunscreen. Apply sunscreen liberally and repeatedly throughout the day. You should seek shade when your shadow is shorter than you. Protect yourself by wearing long sleeves, pants, a wide-brimmed hat, and sunglasses year round, whenever you are outdoors.  . Once a month, do a whole body skin exam, using a mirror to look at the skin on your back. Tell your health care provider of new moles, moles that have irregular borders, moles that are larger than a pencil eraser, or moles that have changed in shape or color.

## 2017-05-02 NOTE — Progress Notes (Addendum)
Subjective:   Amanda [name] is a 71 y.o. female who presents for Medicare Annual (Subsequent) preventive examination.  Review of Systems:  No ROS.  Medicare Wellness Visit. Additional risk factors are reflected in the social history.  Cardiac Risk Factors include: advanced age (>81men, >87 women)  Sleep patterns: no sleep issues.   Home Safety/Smoke Alarms: Feels safe in home. Smoke alarms in place.  Living environment; residence and Firearm Safety: Lives w/ husband. 2-story house, bedroom on main floor, bathroom on main floor, firearms stored safely. Seat Belt Safety/Bike Helmet: Wears seat belt.   Counseling:   Eye Exam- Follows w/ Dr. Valetta Close.  Female:   Pap- Aged out.       Mammo- last 11/10/2015. BI-RADS CATEGORY  1: Negative.      Dexa scan- last 10/15/2014, no report on file.        CCS- last 2005, report on file.    Objective:     Vitals: BP 122/82 (BP Location: Right Arm, Patient Position: Sitting, Cuff Size: Normal)   Pulse 66   Temp 98.5 F (36.9 C) (Oral)   Ht 5\' 4"  (1.626 m)   Wt 126 lb (57.2 kg)   SpO2 99%   BMI 21.63 kg/m   Body mass index is 21.63 kg/m.   Tobacco History  Smoking Status  . Never Smoker  Smokeless Tobacco  . Never Used     Counseling given: Not Answered   Past Medical History:  Diagnosis Date  . Frequency of urination   . GAD (generalized anxiety disorder)   . Osteopenia   . Parkinson disease [facility])    neurologist-  dr Arvella Nigh--  right side predominant  . Prolapse of vaginal wall   . Urgency of urination   . Wears glasses    Past Surgical History:  Procedure Laterality Date  . COLONOSCOPY  last one 2007  . CYSTOCELE REPAIR N/A 01/11/2016   Procedure: ANTERIOR REPAIR (CYSTOCELE) COLOPLAST FASCIA  SACROSPINOUS FIXATION;  Surgeon: Carolan Clines, MD;  Location: [place];  Service: Urology;  Laterality: N/A;  . CYSTOSCOPY W/ URETERAL STENT PLACEMENT Bilateral 01/11/2016   Procedure: CYSTOSCOPY WITH  BILATERAL RETROGRADE PYELOGRAM/URETERAL STENT PLACEMENT;  Surgeon: Carolan Clines, MD;  Location: [place];  Service: Urology;  Laterality: Bilateral;  . ESOPHAGOGASTRODUODENOSCOPY  2007  . EXTERNAL EAR SURGERY Bilateral age 62  . LAPAROSCOPIC OVARIAN CYSTECTOMY  March 2008   unilateral  . PUBOVAGINAL SLING N/A 01/11/2016   Procedure:  ALTIS SLING SINGLE INCISION;  Surgeon: Carolan Clines, MD;  [facility];  Service: Urology;  Laterality: N/A;  . RECTOCELE REPAIR N/A 01/11/2016   Procedure: POSTERIOR REPAIR (RECTOCELE), PERINEAPLASTY;  Surgeon: Dian Queen, MD;  Location: [place];  Service: Gynecology;  Laterality: N/A;  . VAGINAL HYSTERECTOMY N/A 01/11/2016   Procedure: TOTAL VAGINAL HYSTERECTOMY ;  Surgeon: Dian Queen, MD;  Location: [place];  Service: Gynecology;  Laterality: N/A;   Family History  Problem Relation Age of Onset  . Heart disease Mother        enlarged heart  . Cancer Father        pancreatic  . COPD Sister   . Pulmonary embolism Sister   . Edema Sister        Ankles  . Heart disease Brother   . Polycystic kidney disease Son    History  Sexual Activity  . Sexual activity: Not on file    Outpatient Encounter Prescriptions as  of 05/02/2017  Medication Sig  . b complex vitamins tablet Take 1 tablet by mouth daily. MEGA FOOD BALANCE SOURCE  . Cholecalciferol (VITAMIN D3) 1000 UNIT/SPRAY LIQD Take 2 sprays by mouth 4 (four) times daily.   . NON FORMULARY Apply topically 2 (two) times daily. Progesterone 3% and Bi-est Cream 0.1mg  (these are compound separate cream that pt mixes together and apply's  Twice daily  to arm and thigh  . NON FORMULARY Grow Bone Raw Calcium/K+/ Magnesium/ D--   2 caps twice a day  . Nutritional Supplements (DHEA PO) Take 5 mg by mouth at bedtime.   . Probiotic Product (PROBIOTIC DAILY) CAPS Take by mouth as needed. Healthy Intestinal Flora  .  [DISCONTINUED] erythromycin base (E-MYCIN) 500 MG tablet Take 1 tablet (500 mg total) by mouth 2 (two) times daily.  . [DISCONTINUED] sulfamethoxazole-trimethoprim (BACTRIM DS,SEPTRA DS) 800-160 MG tablet Take 1 tablet by mouth 2 (two) times daily.  . hydrochlorothiazide (HYDRODIURIL) 25 MG tablet Take 1 tablet (25 mg total) by mouth daily.  Marland Kitchen ketoconazole (NIZORAL) 2 % cream Apply 1 application topically 2 (two) times daily as needed.  . pramipexole (MIRAPEX) 0.75 MG tablet 0.75 mg 3 (three) times daily.   No facility-administered encounter medications on file as of 05/02/2017.     Activities of Daily Living In your present state of health, do you have any difficulty performing the following activities: 05/02/2017  Hearing? N  Vision? N  Difficulty concentrating or making decisions? N  Walking or climbing stairs? N  Dressing or bathing? N  Doing errands, shopping? N  Preparing Food and eating ? N  Using the Toilet? N  In the past six months, have you accidently leaked urine? N  Do you have problems with loss of bowel control? N  Managing your Medications? N  Managing your Finances? N  Housekeeping or managing your Housekeeping? N  Some recent data might be hidden    Patient Care Team: Carollee Herter, Alferd Apa, DO as PCP - General Star Age, MD as Consulting Physician (Neurology) Dian Queen, MD as Consulting Physician (Obstetrics and Gynecology) Lyndal Pulley, DO as Consulting Physician (Family Medicine) Eldridge Abrahams, MD as Consulting Physician (Neurology) Carolan Clines, MD as Consulting Physician (Urology) Jola Schmidt, MD as Consulting Physician (Ophthalmology)    Assessment:    Physical assessment deferred to PCP.  Exercise Activities and Dietary recommendations Current Exercise Habits: Home exercise routine, Type of exercise: stretching  Diet (meal preparation, eat out, water intake, caffeinated beverages, dairy products, fruits and vegetables): in  general, a "healthy" diet  , well balanced, on average, 3 meals per day. Tries to have fruit, vegetable, and protein at every meal.    Goals    . Increase physical activity (pt-stated)          Starting 05/02/2017, I will start doing Parkinson's exercises with my husband and using the stationary bike 2-3 times weekly.      Fall Risk Fall Risk  05/02/2017 03/04/2016 01/04/2016 07/28/2015 07/28/2015  Falls in the past year? No No No Yes No  Number falls in past yr: - - - 1 -  Injury with Fall? - - - No -  Comment - - - slipped on rock outside -  Risk for fall due to : Impaired balance/gait;Impaired mobility;Other (Comment) - - - -  Risk for fall due to: Comment Parkinson's Disease - - - -   Depression Screen PHQ 2/9 Scores 05/02/2017 03/04/2016 03/05/2015 03/05/2015  PHQ - 2 Score  1 0 4 1  PHQ- 9 Score - - 7 -     Cognitive Function MMSE - Mini Mental State Exam 05/02/2017  Orientation to time 4  Orientation to time comments Disoriented to date, off by 1 day.  Orientation to Place 5  Registration 3  Attention/ Calculation 5  Recall 3  Language- name 2 objects 2  Language- repeat 1  Language- follow 3 step command 3  Language- read & follow direction 1  Write a sentence 1  Copy design 1  Total score 29        Immunization History  Administered Date(s) Administered  . Influenza Split 08/17/2011  . Influenza Whole 10/21/2009  . Influenza,inj,Quad PF,36+ Mos 07/12/2013  . Pneumococcal Conjugate-13 03/05/2015  . Pneumococcal Polysaccharide-23 03/04/2016  . Td 04/22/2004, 10/21/2009  . Tdap 05/09/2016  . Zoster 06/26/2008   Screening Tests Health Maintenance  Topic Date Due  . Hepatitis C Screening  1946/03/27  . Fecal DNA (Cologuard)  05/22/1997  . MAMMOGRAM  11/09/2016  . INFLUENZA VACCINE  04/19/2017  . TETANUS/TDAP  05/09/2026  . DEXA SCAN  Completed  . PNA vac Low Risk Adult  Completed      Plan:    Follow-up w/ PCP as directed.   Cologuard ordered via portal.  Order #[id number].   I have personally reviewed and noted the following in the patient's chart:   . Medical and social history . Use of alcohol, tobacco or illicit drugs  . Current medications and supplements . Functional ability and status . Nutritional status . Physical activity . Advanced directives . List of other physicians . Vitals . Screenings to include cognitive, depression, and falls . Referrals and appointments  In addition, I have reviewed and discussed with patient certain preventive protocols, quality metrics, and best practice recommendations. A written personalized care plan for preventive services as well as general preventive health recommendations were provided to patient.     Dorrene German, RN  05/02/2017

## 2017-05-02 NOTE — Assessment & Plan Note (Signed)
Elevate legs  Drink water hctz  rto 2-3 weeks

## 2017-05-03 LAB — BASIC METABOLIC PANEL
BUN: 17 mg/dL (ref 6–23)
CALCIUM: 9.5 mg/dL (ref 8.4–10.5)
CO2: 32 mEq/L (ref 19–32)
CREATININE: 0.87 mg/dL (ref 0.40–1.20)
Chloride: 103 mEq/L (ref 96–112)
GFR: 68.43 mL/min (ref >60.00)
Glucose, Bld: 88 mg/dL (ref 70–99)
Potassium: 3.7 mEq/L (ref 3.5–5.1)
SODIUM: 140 meq/L (ref 135–145)

## 2017-05-12 ENCOUNTER — Telehealth: Payer: Self-pay | Admitting: Family Medicine

## 2017-05-12 NOTE — Telephone Encounter (Signed)
Patient scheduled 2 week follow up with PCP for 05/18/17, patient would like her shingles vaccination conducted at the time,please advise

## 2017-05-12 NOTE — Telephone Encounter (Signed)
We do not have batch in for new starts yet

## 2017-05-12 NOTE — Telephone Encounter (Signed)
Not yet.  2nd dosers only.

## 2017-05-12 NOTE — Telephone Encounter (Signed)
Please let her know.

## 2017-05-12 NOTE — Telephone Encounter (Signed)
°  Relation to QI:[facility] Call back number:[phone]  Reason for call:  Patient scheduled 2 week follow up with PCP for 05/18/17, patient would like her shingles vaccination conducted at the time,please advise

## 2017-05-16 DIAGNOSIS — Z79899 Other long term (current) drug therapy: Secondary | ICD-10-CM | POA: Diagnosis not present

## 2017-05-16 DIAGNOSIS — G20 Parkinson's disease: Secondary | ICD-10-CM | POA: Diagnosis not present

## 2017-05-16 DIAGNOSIS — F064 Anxiety disorder due to known physiological condition: Secondary | ICD-10-CM | POA: Diagnosis not present

## 2017-05-16 DIAGNOSIS — F329 Major depressive disorder, single episode, unspecified: Secondary | ICD-10-CM | POA: Diagnosis not present

## 2017-05-17 NOTE — Telephone Encounter (Signed)
Patient informed. 

## 2017-05-18 ENCOUNTER — Encounter: Payer: Self-pay | Admitting: Family Medicine

## 2017-05-18 ENCOUNTER — Ambulatory Visit (INDEPENDENT_AMBULATORY_CARE_PROVIDER_SITE_OTHER): Payer: Medicare Other | Admitting: Family Medicine

## 2017-05-18 VITALS — BP 98/60 | HR 69 | Temp 97.7°F | Ht 64.0 in | Wt 125.0 lb

## 2017-05-18 DIAGNOSIS — Z1159 Encounter for screening for other viral diseases: Secondary | ICD-10-CM

## 2017-05-18 DIAGNOSIS — R600 Localized edema: Secondary | ICD-10-CM | POA: Diagnosis not present

## 2017-05-18 DIAGNOSIS — Z23 Encounter for immunization: Secondary | ICD-10-CM | POA: Diagnosis not present

## 2017-05-18 NOTE — Progress Notes (Signed)
Patient ID: Amanda [name], female    DOB: 1946-04-09  Age: 71 y.o. MRN: [id number]    Subjective:  Subjective  HPI Amanda [name] presents for f/u edema.  She is eating/ drinking something with K in it daily  She feels like the swelling has improved.  No sob, no chest pain.  No calf pain  Review of Systems  Constitutional: Negative for appetite change, diaphoresis, fatigue and unexpected weight change.  Eyes: Negative for pain, redness and visual disturbance.  Respiratory: Negative for cough, chest tightness, shortness of breath and wheezing.   Cardiovascular: Negative for chest pain, palpitations and leg swelling.  Endocrine: Negative for cold intolerance, heat intolerance, polydipsia, polyphagia and polyuria.  Genitourinary: Negative for difficulty urinating, dysuria and frequency.  Musculoskeletal: Positive for joint swelling.  Neurological: Negative for dizziness, light-headedness, numbness and headaches.    History Past Medical History:  Diagnosis Date  . Frequency of urination   . GAD (generalized anxiety disorder)   . Osteopenia   . Parkinson disease [facility])    neurologist-  dr Arvella Nigh--  right side predominant  . Prolapse of vaginal wall   . Urgency of urination   . Wears glasses     She has a past surgical history that includes Laparoscopic ovarian cystectomy (March 2008); External ear surgery (Bilateral, age 69); Colonoscopy (last one 2007); Esophagogastroduodenoscopy (2007); Cystoscopy w/ ureteral stent placement (Bilateral, 01/11/2016); Cystocele repair (N/A, 01/11/2016); Pubovaginal sling (N/A, 01/11/2016); Vaginal hysterectomy (N/A, 01/11/2016); and Rectocele repair (N/A, 01/11/2016).   Her family history includes COPD in her sister; Cancer in her father; Edema in her sister; Heart disease in her brother and mother; Polycystic kidney disease in her son; Pulmonary embolism in her sister.She reports that she has never smoked. She has never used smokeless tobacco. She reports  that she does not drink alcohol or use drugs.  Current Outpatient Prescriptions on File Prior to Visit  Medication Sig Dispense Refill  . b complex vitamins tablet Take 1 tablet by mouth daily. MEGA FOOD BALANCE SOURCE    . Cholecalciferol (VITAMIN D3) 1000 UNIT/SPRAY LIQD Take 2 sprays by mouth 2 (two) times daily.     . hydrochlorothiazide (HYDRODIURIL) 25 MG tablet Take 1 tablet (25 mg total) by mouth daily. 30 tablet 3  . NON FORMULARY Apply topically 2 (two) times daily. Progesterone 3% and Bi-est Cream 0.1mg  (these are compound separate cream that pt mixes together and apply's  Twice daily  to arm and thigh    . NON FORMULARY Grow Bone Raw Calcium/K+/ Magnesium/ D--   2 caps twice a day    . Nutritional Supplements (DHEA PO) Take 5 mg by mouth at bedtime.     . pramipexole (MIRAPEX) 0.75 MG tablet 0.75 mg 3 (three) times daily.  4  . Probiotic Product (PROBIOTIC DAILY) CAPS Take by mouth as needed. Healthy Intestinal Flora    . ketoconazole (NIZORAL) 2 % cream Apply 1 application topically 2 (two) times daily as needed.  0   No current facility-administered medications on file prior to visit.      Objective:  Objective  Physical Exam  Constitutional: She is oriented to person, place, and time. She appears well-developed and well-nourished.  HENT:  Head: Normocephalic and atraumatic.  Eyes: Conjunctivae and EOM are normal.  Neck: Normal range of motion. Neck supple. No JVD present. Carotid bruit is not present. No thyromegaly present.  Cardiovascular: Normal rate, regular rhythm and normal heart sounds.   No murmur heard. Pulmonary/Chest: Effort normal and  breath sounds normal. No respiratory distress. She has no wheezes. She has no rales. She exhibits no tenderness.  Musculoskeletal: She exhibits edema.       Right ankle: She exhibits swelling.       Left ankle: She exhibits swelling.       Feet:  Neurological: She is alert and oriented to person, place, and time.    Psychiatric: She has a normal mood and affect.  Nursing note and vitals reviewed.  BP 98/60 (BP Location: Left Arm, Patient Position: Sitting, Cuff Size: Normal)   Pulse 69   Temp 97.7 F (36.5 C) (Oral)   Ht 5\' 4"  (1.626 m)   Wt 125 lb (56.7 kg)   SpO2 98%   BMI 21.46 kg/m  Wt Readings from Last 3 Encounters:  05/18/17 125 lb (56.7 kg)  05/02/17 126 lb (57.2 kg)  05/09/16 119 lb 6.4 oz (54.2 kg)     Lab Results  Component Value Date   WBC 7.0 05/09/2016   HGB 13.0 05/09/2016   HCT 39.1 05/09/2016   PLT 208.0 05/09/2016   GLUCOSE 88 05/02/2017   CHOL 156 05/09/2016   TRIG 94.0 05/09/2016   HDL 64.80 05/09/2016   LDLCALC 73 05/09/2016   ALT 15 05/09/2016   AST 18 05/09/2016   NA 140 05/02/2017   K 3.7 05/02/2017   CL 103 05/02/2017   CREATININE 0.87 05/02/2017   BUN 17 05/02/2017   CO2 32 05/02/2017   TSH 1.00 08/17/2011    US Renal  Result Date: 01/12/2016 CLINICAL DATA:  Left flank pain and rising creatinine after pelvic surgery. EXAM: RENAL / URINARY TRACT ULTRASOUND COMPLETE COMPARISON:  CT abdomen and pelvis 11/19/2015 FINDINGS: Right Kidney: Length: 10.4 cm. Echogenicity within normal limits. No mass. Mild hydronephrosis. Left Kidney: Length: 10.5 cm. Echogenicity within normal limits. No mass. Mild hydronephrosis. Small amount of perinephric fluid. Bladder: Small amount of debris dependently in the bladder. A right ureteral jet was documented, however no left ureteral jet was visualized. IMPRESSION: 1. Mild bilateral hydronephrosis. 2. Small amount of left perinephric fluid. 3. Bladder debris. 4. Lack of visualization of a left ureteral jet. Ureteral obstruction not excluded. Electronically Signed   By: Logan Bores M.D.   On: 01/12/2016 12:36   Dg C-arm 1-60 Min-no Report  Result Date: 01/11/2016 CLINICAL DATA: cysto retrograde/pyelogram C-ARM 1-60 MINUTES Fluoroscopy was utilized by the requesting physician.  No radiographic interpretation.     Assessment &  Plan:  Plan  I am having [name] maintain her NON FORMULARY, b complex vitamins, Nutritional Supplements (DHEA PO), Vitamin D3, pramipexole, NON FORMULARY, PROBIOTIC DAILY, hydrochlorothiazide, ketoconazole, and sertraline.  Meds ordered this encounter  Medications  . sertraline (ZOLOFT) 50 MG tablet    Sig: Take 50 mg by mouth.    Problem List Items Addressed This Visit      Unprioritized   Edema, lower extremity - Primary    Elevated legs con't diuretic       Relevant Orders   Basic metabolic panel    Other Visit Diagnoses    Need for hepatitis C screening test       Relevant Orders   Hepatitis C antibody   Need for influenza vaccination       Relevant Orders   Flu vaccine HIGH DOSE PF (Fluzone High dose) (Completed)      Follow-up: Return in about 3 months (around 08/18/2017).  Ann Held, DO

## 2017-05-18 NOTE — Patient Instructions (Signed)
Edema Edema is when you have too much fluid in your body or under your skin. Edema may make your legs, feet, and ankles swell up. Swelling is also common in looser tissues, like around your eyes. This is a common condition. It gets more common as you get older. There are many possible causes of edema. Eating too much salt (sodium) and being on your feet or sitting for a long time can cause edema in your legs, feet, and ankles. Hot weather may make edema worse. Edema is usually painless. Your skin may look swollen or shiny. Follow these instructions at home:  Keep the swollen body part raised (elevated) above the level of your heart when you are sitting or lying down.  Do not sit still or stand for a long time.  Do not wear tight clothes. Do not wear garters on your upper legs.  Exercise your legs. This can help the swelling go down.  Wear elastic bandages or support stockings as told by your doctor.  Eat a low-salt (low-sodium) diet to reduce fluid as told by your doctor.  Depending on the cause of your swelling, you may need to limit how much fluid you drink (fluid restriction).  Take over-the-counter and prescription medicines only as told by your doctor. Contact a doctor if:  Treatment is not working.  You have heart, liver, or kidney disease and have symptoms of edema.  You have sudden and unexplained weight gain. Get help right away if:  You have shortness of breath or chest pain.  You cannot breathe when you lie down.  You have pain, redness, or warmth in the swollen areas.  You have heart, liver, or kidney disease and get edema all of a sudden.  You have a fever and your symptoms get worse all of a sudden. Summary  Edema is when you have too much fluid in your body or under your skin.  Edema may make your legs, feet, and ankles swell up. Swelling is also common in looser tissues, like around your eyes.  Raise (elevate) the swollen body part above the level of your  heart when you are sitting or lying down.  Follow your doctor's instructions about diet and how much fluid you can drink (fluid restriction). This information is not intended to replace advice given to you by your health care provider. Make sure you discuss any questions you have with your health care provider. Document Released: 02/22/2008 Document Revised: 09/23/2016 Document Reviewed: 09/23/2016 Elsevier Interactive Patient Education  2017 [employer]  

## 2017-05-21 NOTE — Assessment & Plan Note (Signed)
Elevated legs con't diuretic

## 2017-05-24 ENCOUNTER — Ambulatory Visit (INDEPENDENT_AMBULATORY_CARE_PROVIDER_SITE_OTHER): Payer: Medicare Other

## 2017-05-24 ENCOUNTER — Encounter: Payer: Self-pay | Admitting: Podiatry

## 2017-05-24 ENCOUNTER — Ambulatory Visit (INDEPENDENT_AMBULATORY_CARE_PROVIDER_SITE_OTHER): Payer: Medicare Other | Admitting: Podiatry

## 2017-05-24 ENCOUNTER — Other Ambulatory Visit: Payer: Self-pay | Admitting: Podiatry

## 2017-05-24 DIAGNOSIS — M79672 Pain in left foot: Secondary | ICD-10-CM

## 2017-05-24 DIAGNOSIS — M2040 Other hammer toe(s) (acquired), unspecified foot: Secondary | ICD-10-CM

## 2017-05-24 DIAGNOSIS — M2041 Other hammer toe(s) (acquired), right foot: Secondary | ICD-10-CM

## 2017-05-24 DIAGNOSIS — M21619 Bunion of unspecified foot: Secondary | ICD-10-CM | POA: Diagnosis not present

## 2017-05-24 DIAGNOSIS — M79671 Pain in right foot: Secondary | ICD-10-CM

## 2017-05-24 NOTE — Progress Notes (Signed)
   Subjective:    Patient ID: Amanda [name], female    DOB: [birth year], 71 y.o.   MRN: [id number]  HPI  Chief Complaint  Patient presents with  . Nail Problem    B/L   . Toe Pain    2,3rd and 4th toes bent and hurt/ possible bunion/ Rt Foot       Review of Systems  Constitutional: Positive for fatigue.  Cardiovascular: Positive for leg swelling.  Gastrointestinal: Positive for constipation.  Endocrine: Positive for polyuria.  Genitourinary: Positive for urgency.  Musculoskeletal: Positive for gait problem.  Skin:       Change in nails  Neurological: Positive for tremors.       Objective:   Physical Exam        Assessment & Plan:

## 2017-05-24 NOTE — Patient Instructions (Signed)

## 2017-05-25 NOTE — Progress Notes (Signed)
Subjective:    Patient ID: Amanda [name], female   DOB: 71 y.o.   MRN: [id number]   HPI patient states she's had pain in both her feet with significant digital contracture digits 23 for the right foot structural bunion and moderate discomfort in the metatarsophalangeal joints of both feet    Review of Systems  All other systems reviewed and are negative.       Objective:  Physical Exam  Constitutional: She appears well-developed and well-nourished.  Cardiovascular: Intact distal pulses.   Pulmonary/Chest: Effort normal.  Musculoskeletal: Normal range of motion.  Neurological: She is alert.  Skin: Skin is warm.  Nursing note and vitals reviewed.  neurovascular status found to be intact muscle strength adequate range of motion within normal limits with patient noted to have significant digital contraction of the lesser digits right over left with moderate rigid contracture of the lesser digits right and discomfort in the metatarsophalangeal joint with mild structural bunion deformity right with redness around it. 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 We discussed orthotics which may be necessary and patient be reevaluated side at the appropriate and currently do not recommend surgery but I did educate patient on surgical intervention possible  X-rays indicate that there is structural bunion deformity with relative rigid contracture of the lesser digits

## 2017-07-20 ENCOUNTER — Other Ambulatory Visit: Payer: Self-pay | Admitting: Obstetrics and Gynecology

## 2017-07-20 DIAGNOSIS — N6320 Unspecified lump in the left breast, unspecified quadrant: Secondary | ICD-10-CM

## 2017-07-20 HISTORY — PX: BREAST BIOPSY: SHX20

## 2017-07-21 ENCOUNTER — Ambulatory Visit
Admission: RE | Admit: 2017-07-21 | Discharge: 2017-07-21 | Disposition: A | Discharge status: Home / Self Care | Payer: Medicare Other | Source: Ambulatory Visit | Attending: Obstetrics and Gynecology | Admitting: Obstetrics and Gynecology

## 2017-07-21 ENCOUNTER — Other Ambulatory Visit: Payer: Self-pay | Admitting: Obstetrics and Gynecology

## 2017-07-21 DIAGNOSIS — N6320 Unspecified lump in the left breast, unspecified quadrant: Secondary | ICD-10-CM

## 2017-07-21 DIAGNOSIS — R928 Other abnormal and inconclusive findings on diagnostic imaging of breast: Secondary | ICD-10-CM

## 2017-07-21 DIAGNOSIS — N6323 Unspecified lump in the left breast, lower outer quadrant: Secondary | ICD-10-CM | POA: Diagnosis not present

## 2017-07-21 DIAGNOSIS — R922 Inconclusive mammogram: Secondary | ICD-10-CM | POA: Diagnosis not present

## 2017-07-21 DIAGNOSIS — Z170 Estrogen receptor positive status [ER+]: Secondary | ICD-10-CM | POA: Diagnosis not present

## 2017-07-21 DIAGNOSIS — C50512 Malignant neoplasm of lower-outer quadrant of left female breast: Secondary | ICD-10-CM | POA: Diagnosis not present

## 2017-07-27 ENCOUNTER — Telehealth: Payer: Self-pay | Admitting: *Deleted

## 2017-07-27 NOTE — Telephone Encounter (Signed)
Received Dermatopathology Report results from [facility]; forwarded to provider/SLS 11/08

## 2017-07-31 ENCOUNTER — Telehealth: Payer: Self-pay | Admitting: *Deleted

## 2017-07-31 DIAGNOSIS — C50919 Malignant neoplasm of unspecified site of unspecified female breast: Secondary | ICD-10-CM | POA: Diagnosis not present

## 2017-07-31 NOTE — Telephone Encounter (Signed)
Received Surgical Pathology Report results from [facility]; forwarded to provider/SLS 11/12

## 2017-08-01 ENCOUNTER — Encounter: Payer: Self-pay | Admitting: Radiation Oncology

## 2017-08-01 ENCOUNTER — Telehealth: Payer: Self-pay | Admitting: Hematology and Oncology

## 2017-08-01 NOTE — Telephone Encounter (Signed)
Appt has been scheduled for the pt to see Dr. Lindi Adie on 11/16 at 12:15pm. Pt aware to arrive 30 minutes early.

## 2017-08-02 ENCOUNTER — Other Ambulatory Visit: Payer: Self-pay | Admitting: Surgery

## 2017-08-02 DIAGNOSIS — C50919 Malignant neoplasm of unspecified site of unspecified female breast: Secondary | ICD-10-CM

## 2017-08-03 ENCOUNTER — Other Ambulatory Visit: Payer: Self-pay | Admitting: Surgery

## 2017-08-03 ENCOUNTER — Telehealth: Payer: Self-pay | Admitting: Hematology and Oncology

## 2017-08-03 DIAGNOSIS — C50912 Malignant neoplasm of unspecified site of left female breast: Principal | ICD-10-CM

## 2017-08-03 DIAGNOSIS — C50911 Malignant neoplasm of unspecified site of right female breast: Secondary | ICD-10-CM

## 2017-08-03 NOTE — Telephone Encounter (Signed)
Spoke with patient and got them rescheduled for appt per 11/14 sch msg.

## 2017-08-04 ENCOUNTER — Ambulatory Visit: Payer: Self-pay | Admitting: Hematology and Oncology

## 2017-08-08 ENCOUNTER — Ambulatory Visit
Admission: RE | Admit: 2017-08-08 | Discharge: 2017-08-08 | Disposition: A | Discharge status: Home / Self Care | Payer: Medicare Other | Source: Ambulatory Visit | Attending: Surgery | Admitting: Surgery

## 2017-08-08 DIAGNOSIS — C50911 Malignant neoplasm of unspecified site of right female breast: Secondary | ICD-10-CM

## 2017-08-08 DIAGNOSIS — N6489 Other specified disorders of breast: Secondary | ICD-10-CM | POA: Diagnosis not present

## 2017-08-08 DIAGNOSIS — C50912 Malignant neoplasm of unspecified site of left female breast: Principal | ICD-10-CM

## 2017-08-08 MED ORDER — GADOBENATE DIMEGLUMINE 529 MG/ML IV SOLN: 12.0000 mL | Freq: Once | INTRAVENOUS | Status: DC | PRN

## 2017-08-09 ENCOUNTER — Ambulatory Visit: Payer: Self-pay | Admitting: Hematology and Oncology

## 2017-08-09 NOTE — Progress Notes (Signed)
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  Receptor Status: ER(95%), PR (60%), Her2-neu (NEG), Ki-(10%)  Did patient present with symptoms or was this found on screening mammography?: She found the mass herself in mid October  Past/Anticipated interventions by surgeon, if any: Dr. Brantley Stage saw her on 08/01/17. He documents that she would like to try anti-estrogen therapy for a few months to try and shrink her tumor.   Past/Anticipated interventions by medical oncology, if any: Dr. Lindi Adie 08/14/17 Recommendation: 1.  Neoadjuvant therapy with either chemotherapy or hormonal therapy based upon Oncotype DX testing 2. while we are waiting for Oncotype DX and started the patient on letrozole 2.5 mg daily 3.  5-6 months of neoadjuvant therapy followed by surgery depending on the degree of response, patient may be eligible for lumpectomy versus mastectomy 4.  Followed by adjuvant radiation 5.  Followed by antiestrogen therapy  She has an appointment with him again on 09/13/17  Lymphedema issues, if any:  N/A  Pain issues, if any:  She has pain related to her feet which has been chronic  SAFETY ISSUES:  Prior radiation? No  Pacemaker/ICD? No  Possible current pregnancy? N/A  Is the patient on methotrexate? No  Current Complaints / other details:    BP (!) 110/48   Pulse 66   Temp 98 F (36.7 C)   Ht _0  (1.626 m)   Wt 127 lb 9.6 oz (57.9 kg)   SpO2 99% Comment: room air  BMI 21.90 kg/m    Wt Readings from Last 3 Encounters:  08/15/17 127 lb 9.6 oz (57.9 kg)  08/14/17 127 lb (57.6 kg)  05/18/17 125 lb (56.7 kg)      Amanda [name], Stephani Police, RN 08/09/2017,7:38 AM

## 2017-08-14 ENCOUNTER — Telehealth: Payer: Self-pay | Admitting: Family Medicine

## 2017-08-14 ENCOUNTER — Ambulatory Visit: Payer: Self-pay | Admitting: Hematology and Oncology

## 2017-08-14 ENCOUNTER — Ambulatory Visit (HOSPITAL_BASED_OUTPATIENT_CLINIC_OR_DEPARTMENT_OTHER): Payer: Medicare Other | Admitting: Hematology and Oncology

## 2017-08-14 DIAGNOSIS — Z170 Estrogen receptor positive status [ER+]: Secondary | ICD-10-CM | POA: Diagnosis not present

## 2017-08-14 DIAGNOSIS — G20 Parkinson's disease: Secondary | ICD-10-CM | POA: Diagnosis not present

## 2017-08-14 DIAGNOSIS — R600 Localized edema: Secondary | ICD-10-CM

## 2017-08-14 DIAGNOSIS — C50512 Malignant neoplasm of lower-outer quadrant of left female breast: Secondary | ICD-10-CM | POA: Diagnosis not present

## 2017-08-14 MED ORDER — LETROZOLE 2.5 MG PO TABS: 2.5000 mg | ORAL_TABLET | Freq: Every day | ORAL | 3 refills | Status: DC

## 2017-08-14 MED ORDER — HYDROCHLOROTHIAZIDE 25 MG PO TABS: 25.0000 mg | ORAL_TABLET | Freq: Every day | ORAL | 0 refills | Status: DC

## 2017-08-14 NOTE — Assessment & Plan Note (Signed)
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 After reviewing the pathology in detail, we proceeded to discuss the different treatment options between surgery, radiation, chemotherapy, antiestrogen therapies.  Recommendation: 1.  Neoadjuvant therapy with either chemotherapy or hormonal therapy based upon Oncotype DX testing 2. while we are waiting for Oncotype DX and started the patient on letrozole 2.5 mg daily 3.  5-6 months of neoadjuvant therapy followed by surgery depending on the degree of response, patient may be eligible for lumpectomy versus mastectomy 4.  Followed by adjuvant radiation 5.  Followed by antiestrogen therapy ---------------------------------------------------------------- Oncotype counseling: I discussed Oncotype DX test. I explained to the patient that this is a 21 gene panel to evaluate patient tumors DNA to calculate recurrence score. This would help determine whether patient has high risk or intermediate risk or low risk breast cancer. She understands that if her tumor was found to be high risk, she would benefit from systemic chemotherapy. If low risk, no need of chemotherapy. If she was found to be intermediate risk, we would need to evaluate the score as well as other risk factors and determine if an abbreviated chemotherapy may be of benefit.  Return to clinic based upon Oncotype DX testing. If she is low risk and does not need chemo, then I will see her  back in 4 weeks for evaluation on letrozole therapy.

## 2017-08-14 NOTE — Progress Notes (Signed)
[place] NOTE  Patient Care Team: Carollee Herter, Alferd Apa, DO as PCP - General Star Age, MD as Consulting Physician (Neurology) Dian Queen, MD as Consulting Physician (Obstetrics and Gynecology) Lyndal Pulley, DO as Consulting Physician (Family Medicine) Eldridge Abrahams, MD as Consulting Physician (Neurology) Carolan Clines, MD as Consulting Physician (Urology) Jola Schmidt, MD as Consulting Physician (Ophthalmology)  CHIEF COMPLAINTS/PURPOSE OF CONSULTATION:  Newly diagnosed breast cancer  HISTORY OF PRESENTING ILLNESS:  Amanda [name] 71 y.o. female is here because of recent diagnosis of left breast cancer.  Patient has a long-standing history of parkinsonism.  She felt this lump while she was lying in bed and immediately brought her to the attention of her physicians.  Mammogram revealed a large mass in the left breast.  This was evaluated by ultrasound and biopsy was performed.  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  Because the MRI showed that the tumor was 5.6 cm, at the current state patient would require a mastectomy.  Because of this she was referred to [place] for evaluation for neoadjuvant therapy.  We presented her case in the multidisciplinary tumor board and she is here today to discuss the final plan. The patient feels that the lump has come down the breast since she originally felt it.  I reviewed her records extensively and collaborated the history with the patient.  SUMMARY OF ONCOLOGIC HISTORY:   Malignant neoplasm of lower-outer quadrant of left breast of female, estrogen receptor positive ([place])   07/21/2017 Initial Diagnosis    Left breast lower outer quadrant 5.4 x 2.3 x 3.8 cm mass along with additional satellite nodules measuring 6 mm and several other small satellite nodules, no lymphadenopathy, biopsy revealed invasive lobular cancer with LCIS grade 2, ER 95%, PR 60%, Ki-67 10%, HER-2  negative ratio 1.11, T3 N0 stage II a clinical stage AJCC 8       MEDICAL HISTORY:  Past Medical History:  Diagnosis Date  . Frequency of urination   . GAD (generalized anxiety disorder)   . Osteopenia   . Parkinson disease [facility])    neurologist-  dr Arvella Nigh--  right side predominant  . Prolapse of vaginal wall   . Urgency of urination   . Wears glasses     SURGICAL HISTORY: Past Surgical History:  Procedure Laterality Date  . COLONOSCOPY  last one 2007  . CYSTOCELE REPAIR N/A 01/11/2016   Procedure: ANTERIOR REPAIR (CYSTOCELE) COLOPLAST FASCIA  SACROSPINOUS FIXATION;  Surgeon: Carolan Clines, MD;  Location: [place];  Service: Urology;  Laterality: N/A;  . CYSTOSCOPY W/ URETERAL STENT PLACEMENT Bilateral 01/11/2016   Procedure: CYSTOSCOPY WITH BILATERAL RETROGRADE PYELOGRAM/URETERAL STENT PLACEMENT;  Surgeon: Carolan Clines, MD;  Location: [facility];  Service: Urology;  Laterality: Bilateral;  . ESOPHAGOGASTRODUODENOSCOPY  2007  . EXTERNAL EAR SURGERY Bilateral age 59  . LAPAROSCOPIC OVARIAN CYSTECTOMY  March 2008   unilateral  . PUBOVAGINAL SLING N/A 01/11/2016   Procedure:  ALTIS SLING SINGLE INCISION;  Surgeon: Carolan Clines, MD;  Location: [facility];  Service: Urology;  Laterality: N/A;  . RECTOCELE REPAIR N/A 01/11/2016   Procedure: POSTERIOR REPAIR (RECTOCELE), PERINEAPLASTY;  Surgeon: Dian Queen, MD;  Location: [place];  Service: Gynecology;  Laterality: N/A;  . VAGINAL HYSTERECTOMY N/A 01/11/2016   Procedure: TOTAL VAGINAL HYSTERECTOMY ;  Surgeon: Dian Queen, MD;  Location: [place];  Service: Gynecology;  Laterality: N/A;  SOCIAL HISTORY: Social History   Socioeconomic History  . Marital status: Married    Spouse name: Chrissie Noa  . Number of children: 3  . Years of education: College  . Highest education level: Not on file  Social Needs  . Financial  resource strain: Not on file  . Food insecurity - worry: Not on file  . Food insecurity - inability: Not on file  . Transportation needs - medical: Not on file  . Transportation needs - non-medical: Not on file  Occupational History  . Occupation: retired  Tobacco Use  . Smoking status: Never Smoker  . Smokeless tobacco: Never Used  Substance and Sexual Activity  . Alcohol use: No  . Drug use: No  . Sexual activity: Not on file  Other Topics Concern  . Not on file  Social History Narrative   Pt lives at home with family.   Caffeine Use: some, occasionally    FAMILY HISTORY: Family History  Problem Relation Age of Onset  . Heart disease Mother        enlarged heart  . Cancer Father        pancreatic  . COPD Sister   . Pulmonary embolism Sister   . Edema Sister        Ankles  . Heart disease Brother   . Polycystic kidney disease Son     ALLERGIES:  is allergic to penicillins.  MEDICATIONS:  Current Outpatient Medications  Medication Sig Dispense Refill  . b complex vitamins tablet Take 1 tablet by mouth daily. MEGA FOOD BALANCE SOURCE    . Cholecalciferol (VITAMIN D3) 1000 UNIT/SPRAY LIQD Take 2 sprays by mouth 2 (two) times daily.     . hydrochlorothiazide (HYDRODIURIL) 25 MG tablet Take 1 tablet (25 mg total) by mouth daily. 30 tablet 0  . letrozole (FEMARA) 2.5 MG tablet Take 1 tablet (2.5 mg total) by mouth daily. 90 tablet 3  . NON FORMULARY Apply topically 2 (two) times daily. Progesterone 3% and Bi-est Cream 0.'1mg'$  (these are compound separate cream that pt mixes together and apply's  Twice daily  to arm and thigh    . NON FORMULARY Grow Bone Raw Calcium/K+/ Magnesium/ D--   2 caps twice a day    . Nutritional Supplements (DHEA PO) Take 5 mg by mouth at bedtime.     . pramipexole (MIRAPEX) 0.75 MG tablet 0.75 mg 3 (three) times daily.  4  . Probiotic Product (PROBIOTIC DAILY) CAPS Take by mouth as needed. Healthy Intestinal Flora    . sertraline (ZOLOFT) 50 MG  tablet Take 50 mg by mouth.     No current facility-administered medications for this visit.     REVIEW OF SYSTEMS:   Constitutional: Denies fevers, chills or abnormal night sweats Eyes: Denies blurriness of vision, double vision or watery eyes Ears, nose, mouth, throat, and face: Denies mucositis or sore throat Respiratory: Denies cough, dyspnea or wheezes Cardiovascular: Denies palpitation, chest discomfort or lower extremity swelling Gastrointestinal:  Denies nausea, heartburn or change in bowel habits Skin: Denies abnormal skin rashes Lymphatics: Denies new lymphadenopathy or easy bruising Neurological:Denies numbness, tingling or new weaknesses Behavioral/Psych: Mood is stable, no new changes  Breast: Palpable lump in the left breast All other systems were reviewed with the patient and are negative.  PHYSICAL EXAMINATION: ECOG PERFORMANCE STATUS: 1 - Symptomatic but completely ambulatory  Vitals:   08/14/17 1427  BP: 130/64  Pulse: 71  Resp: 18  Temp: 97.7 F (36.5 C)  SpO2: 100%  Filed Weights   08/14/17 1427  Weight: 127 lb (57.6 kg)    GENERAL:alert, no distress and comfortable SKIN: skin color, texture, turgor are normal, no rashes or significant lesions EYES: normal, conjunctiva are pink and non-injected, sclera clear OROPHARYNX:no exudate, no erythema and lips, buccal mucosa, and tongue normal  NECK: supple, thyroid normal size, non-tender, without nodularity LYMPH:  no palpable lymphadenopathy in the cervical, axillary or inguinal LUNGS: clear to auscultation and percussion with normal breathing effort HEART: regular rate & rhythm and no murmurs and no lower extremity edema ABDOMEN:abdomen soft, non-tender and normal bowel sounds Musculoskeletal:no cyanosis of digits and no clubbing  PSYCH: alert & oriented x 3 with fluent speech NEURO: no focal motor/sensory deficits BREAST: Large palpable lump in the left breast. No palpable axillary or supraclavicular  lymphadenopathy (exam performed in the presence of a chaperone)   LABORATORY DATA:  I have reviewed the data as listed Lab Results  Component Value Date   WBC 7.0 05/09/2016   HGB 13.0 05/09/2016   HCT 39.1 05/09/2016   MCV 87.5 05/09/2016   PLT 208.0 05/09/2016   Lab Results  Component Value Date   NA 140 05/02/2017   K 3.7 05/02/2017   CL 103 05/02/2017   CO2 32 05/02/2017    RADIOGRAPHIC STUDIES: I have personally reviewed the radiological reports and agreed with the findings in the report.  ASSESSMENT AND PLAN:  Malignant neoplasm of lower-outer quadrant of left breast of female, estrogen receptor positive ([place]) 07/21/17: Left breast lower outer quadrant 5.4 x 2.3 x 3.8 cm mass along with additional satellite nodules measuring 6 mm and several other small satellite nodules, no lymphadenopathy, 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 After reviewing the pathology in detail, we proceeded to discuss the different treatment options between surgery, radiation, chemotherapy, antiestrogen therapies.  Recommendation: 1.  Neoadjuvant therapy with either chemotherapy or hormonal therapy based upon Oncotype DX testing 2. while we are waiting for Oncotype DX and started the patient on letrozole 2.5 mg daily 3.  5-6 months of neoadjuvant therapy followed by surgery depending on the degree of response, patient may be eligible for lumpectomy versus mastectomy 4.  Followed by adjuvant radiation 5.  Followed by antiestrogen therapy ---------------------------------------------------------------- Oncotype counseling: I discussed Oncotype DX test. I  explained to the patient that this is a 21 gene panel to evaluate patient tumors DNA to calculate recurrence score. This would help determine whether patient has high risk or intermediate risk or low risk breast cancer. She understands that if her tumor was found to be high risk, she would benefit from systemic chemotherapy. If low risk, no need of chemotherapy. If she was found to be intermediate risk, we would need to evaluate the score as well as other risk factors and determine if an abbreviated chemotherapy may be of benefit.  Return to clinic based upon Oncotype DX testing. If she is low risk and does not need chemo, then I will see her back in 4 weeks for evaluation on letrozole therapy.   All questions were answered. The patient knows to call the clinic with any problems, questions or concerns.    Rulon Eisenmenger, MD  08/14/17  

## 2017-08-14 NOTE — Telephone Encounter (Signed)
Copied from [place] [id number]. Topic: Quick Communication - See Telephone Encounter >> Aug 14, 2017  9:09 AM [place], Laban Emperor wrote: CRM for notification. See Telephone encounter for: [name] with [facility] calling to request a med refill on pt's hydrochlorothiazide (HYDRODIURIL) 25 MG tablet [[id number]] .  08/14/17.

## 2017-08-14 NOTE — Telephone Encounter (Signed)
Refill request

## 2017-08-14 NOTE — Telephone Encounter (Signed)
rx sent in 

## 2017-08-15 ENCOUNTER — Ambulatory Visit
Admission: RE | Admit: 2017-08-15 | Discharge: 2017-08-15 | Disposition: A | Discharge status: Home / Self Care | Payer: Medicare Other | Source: Ambulatory Visit | Attending: Radiation Oncology | Admitting: Radiation Oncology

## 2017-08-15 ENCOUNTER — Ambulatory Visit: Payer: Medicare Other | Admitting: Family Medicine

## 2017-08-15 ENCOUNTER — Encounter: Payer: Self-pay | Admitting: *Deleted

## 2017-08-15 ENCOUNTER — Telehealth: Payer: Self-pay | Admitting: *Deleted

## 2017-08-15 ENCOUNTER — Telehealth: Payer: Self-pay | Admitting: Hematology and Oncology

## 2017-08-15 ENCOUNTER — Encounter: Payer: Self-pay | Admitting: Radiation Oncology

## 2017-08-15 ENCOUNTER — Ambulatory Visit: Payer: Self-pay | Admitting: Family Medicine

## 2017-08-15 DIAGNOSIS — C50512 Malignant neoplasm of lower-outer quadrant of left female breast: Secondary | ICD-10-CM | POA: Diagnosis not present

## 2017-08-15 DIAGNOSIS — Z170 Estrogen receptor positive status [ER+]: Principal | ICD-10-CM

## 2017-08-15 DIAGNOSIS — G20 Parkinson's disease: Secondary | ICD-10-CM | POA: Insufficient documentation

## 2017-08-15 DIAGNOSIS — Z880 Allergy status to penicillin: Secondary | ICD-10-CM | POA: Insufficient documentation

## 2017-08-15 DIAGNOSIS — M8580 Other specified disorders of bone density and structure, unspecified site: Secondary | ICD-10-CM | POA: Insufficient documentation

## 2017-08-15 DIAGNOSIS — G8929 Other chronic pain: Secondary | ICD-10-CM | POA: Insufficient documentation

## 2017-08-15 DIAGNOSIS — Z79899 Other long term (current) drug therapy: Secondary | ICD-10-CM | POA: Insufficient documentation

## 2017-08-15 NOTE — Telephone Encounter (Signed)
Ordered oncotype on core bx. Per Dr. Lindi Adie.  Faxed requisition to pathology and confirmed receipt.  Faxed PA to [facility].

## 2017-08-15 NOTE — Telephone Encounter (Signed)
Scheduled appt per 11/26 los - f/u in 4 weeks . Sent reminder letter in the mail with appt date and time.

## 2017-08-15 NOTE — Progress Notes (Addendum)
Radiation Oncology         [phone] ________________________________  Initial outpatient Consultation  Name: Amanda [name] MRN: [id number]  Date: 08/15/2017  DOB: 1946-09-08  GU:RKYHC Chase, Alferd Apa, DO  Cornett, Marcello Moores, MD   REFERRING PHYSICIAN: Erroll Luna, MD  DIAGNOSIS:    ICD-10-CM   1. 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 female who presented with a palpable lump felt while lying in bed in mid Oct. Pursuant mammogram confirmed large mass in left breast.   US guided biopsy on 07/21/17 showed invasive mammary cancer with characteristics as described above in the diagnosis.  MRI on 08/08/17 showed 5.4 x 2.3 x 3.8 cm mass involving a large portion of the lower outer left breast and extending to the dermis of the inferior left breast with several surrounding satellite nodules. No suspicious lymph nodes were noted.   I have personally reviewed all of the patient's imaging.  Dr. Lindi Adie in medical oncology recommends neoadjuvant chemotherapy or hormonal therapy depending on Oncotype DX testing. Depending on degree of response to medicinal treatments, patient may be eligible for lumpectomy vs mastectomy. Patient was placed on letrozole and began treatment yesterday.  The patient has been kindly referred to radiation oncology to discuss potential radiation therapy treatment.  On review  of systems, patient reports of known hx of Parkinson's. She endorses a tremor in her right hand and a shuffling gait. Endorses pramipexole. She endorses chronic foot pain as well.  PREVIOUS RADIATION THERAPY: No  PAST MEDICAL HISTORY:  has a past medical history of Frequency of urination, GAD (generalized anxiety disorder), Osteopenia, Parkinson disease ([place]), Prolapse of vaginal wall, Urgency of urination, and Wears glasses.    PAST SURGICAL HISTORY: Past Surgical History:  Procedure Laterality Date  . COLONOSCOPY  last one 2007  . CYSTOCELE REPAIR N/A 01/11/2016   Procedure: ANTERIOR REPAIR (CYSTOCELE) COLOPLAST FASCIA  SACROSPINOUS FIXATION;  Surgeon: Carolan Clines, MD;  Location: [facility];  Service: Urology;  Laterality: N/A;  . CYSTOSCOPY W/ URETERAL STENT PLACEMENT Bilateral 01/11/2016   Procedure: CYSTOSCOPY WITH BILATERAL RETROGRADE PYELOGRAM/URETERAL STENT PLACEMENT;  Surgeon: Carolan Clines, MD;  Location: [place] [facility];  Service: Urology;  Laterality: Bilateral;  . ESOPHAGOGASTRODUODENOSCOPY  2007  . EXTERNAL EAR SURGERY Bilateral age 73  . LAPAROSCOPIC OVARIAN CYSTECTOMY  March 2008   unilateral  . PUBOVAGINAL SLING N/A 01/11/2016   Procedure:  ALTIS SLING SINGLE INCISION;  Surgeon: Carolan Clines, MD;  Location: [facility];  Service: Urology;  Laterality: N/A;  . RECTOCELE REPAIR N/A 01/11/2016   Procedure: POSTERIOR REPAIR (RECTOCELE), PERINEAPLASTY;  Surgeon: Dian Queen, MD;  Location: [place];  Service: Gynecology;  Laterality: N/A;  . VAGINAL HYSTERECTOMY N/A 01/11/2016   Procedure: TOTAL VAGINAL HYSTERECTOMY ;  Surgeon: Dian Queen, MD;  Location: [place];  Service: Gynecology;  Laterality: N/A;    FAMILY  HISTORY: family history includes COPD in her sister; Cancer in her father; Edema in her sister; Heart disease in her brother and mother; Polycystic kidney disease in her  son; Pulmonary embolism in her sister.  SOCIAL HISTORY:  reports that  has never smoked. she has never used smokeless tobacco. She reports that she does not drink alcohol or use drugs.  ALLERGIES: Penicillins  MEDICATIONS:  Current Outpatient Medications  Medication Sig Dispense Refill  . b complex vitamins tablet Take 1 tablet by mouth daily. MEGA FOOD BALANCE SOURCE    . Cholecalciferol (VITAMIN D3) 1000 UNIT/SPRAY LIQD Take 2 sprays by mouth 2 (two) times daily.     . hydrochlorothiazide (HYDRODIURIL) 25 MG tablet Take 1 tablet (25 mg total) by mouth daily. (Patient taking differently: Take 25 mg by mouth daily. As needed for swelling in her ankles) 30 tablet 0  . letrozole (FEMARA) 2.5 MG tablet Take 1 tablet (2.5 mg total) by mouth daily. 90 tablet 3  . NON FORMULARY Grow Bone Raw Calcium/K+/ Magnesium/ D--   2 caps twice a day    . Nutritional Supplements (DHEA PO) Take 5 mg by mouth at bedtime.     . pramipexole (MIRAPEX) 0.75 MG tablet 0.75 mg 3 (three) times daily.  4  . sertraline (ZOLOFT) 50 MG tablet Take 50 mg by mouth daily.     . Probiotic Product (PROBIOTIC DAILY) CAPS Take by mouth as needed. Healthy Intestinal Flora     No current facility-administered medications for this encounter.     REVIEW OF SYSTEMS: As above   PHYSICAL EXAM:  height is '5\' 4"'  (1.626 m) and weight is 127 lb 9.6 oz (57.9 kg). Her temperature is 98 F (36.7 C). Her blood pressure is 110/48 (abnormal) and her pulse is 66. Her oxygen saturation is 99%.   General: Alert and oriented, in no acute distress HEENT: Head is normocephalic. Extraocular movements are intact. Oropharynx is clear. Neck: Neck is supple, no palpable cervical or supraclavicular lymphadenopathy. Heart: Regular in rate and rhythm with no murmurs, rubs, or gallops. Chest: Clear to auscultation bilaterally, with no rhonchi, wheezes, or rales. Abdomen: Soft, nontender, nondistended, with no rigidity or guarding. Extremities: No  cyanosis or edema. Lymphatics: see Neck Exam Skin: No concerning lesions. Musculoskeletal: symmetric strength and muscle tone throughout. No cogwheel rigidity in arms Neurologic: Resting tremor in the right hand.  Psychiatric: Judgment and insight are intact. Affect is appropriate. Breasts: In the lower outer quadrant of left the breast a hard 6 cm mass was noted. No other palpable masses appreciated in the breasts or axillae bilaterally .   ECOG = 1  0 - Asymptomatic (Fully active, able to carry on all predisease activities without restriction)  1 - Symptomatic but completely ambulatory (Restricted in physically strenuous activity but ambulatory and able to carry out work of a light or sedentary nature. For example, light housework, office work)  2 - Symptomatic, <50% in bed during the day (Ambulatory and capable of all self care but unable to carry out any work activities. Up and about more than 50% of waking hours)  3 - Symptomatic, >50% in bed, but not bedbound (Capable of only limited self-care, confined to bed or chair 50% or more of waking hours)  4 - Bedbound (Completely disabled. Cannot carry on any self-care. Totally confined to bed or chair)  5 - Death   Eustace Pen MM, Creech RH, Tormey DC, et al. [phone]). "Toxicity and response criteria of the [facility]  Group". [place] Oncol. 5 (6): 649-55   LABORATORY DATA:  Lab Results  Component Value Date   WBC 7.0 05/09/2016   HGB 13.0 05/09/2016   HCT 39.1 05/09/2016   MCV 87.5 05/09/2016   PLT 208.0 05/09/2016   CMP     Component Value Date/Time   NA 140 05/02/2017 1621   K 3.7 05/02/2017 1621   CL 103 05/02/2017 1621   CO2 32 05/02/2017 1621   GLUCOSE 88 05/02/2017 1621   BUN 17 05/02/2017 1621   CREATININE 0.87 05/02/2017 1621   CALCIUM 9.5 05/02/2017 1621   PROT 6.7 05/09/2016 1040   ALBUMIN 4.1 05/09/2016 1040   AST 18 05/09/2016 1040   ALT 15 05/09/2016 1040   ALKPHOS 59 05/09/2016 1040    BILITOT 0.3 05/09/2016 1040   GFRNONAA 27 (L) 01/13/2016 1044   GFRAA 31 (L) 01/13/2016 1044         RADIOGRAPHY: Mr Breast Bilateral W Wo Contrast  Result Date: 08/09/2017 CLINICAL DATA:  71 year old female with recently diagnosed grade 2 invasive left breast cancer and mammary carcinoma in situ with calcifications post ultrasound-guided biopsy of a mass at the 4 o'clock position. LABS:  Creatinine was obtained on site at [facility] at [address] Results: Creatinine 0.9 mg/dL. EXAM: BILATERAL BREAST MRI WITH AND WITHOUT CONTRAST TECHNIQUE: Multiplanar, multisequence MR images of both breasts were obtained prior to and following the intravenous administration of 12 ml of MultiHance. THREE-DIMENSIONAL MR IMAGE RENDERING ON INDEPENDENT WORKSTATION: Three-dimensional MR images were rendered by post-processing of the original MR data on an independent workstation. The three-dimensional MR images were interpreted, and findings are reported in the following complete MRI report for this study. Three dimensional images were evaluated at the independent DynaCad workstation COMPARISON:  Previous exam(s). FINDINGS: Breast composition: c.  Heterogeneous fibroglandular tissue. Background parenchymal enhancement: Moderate. Right breast: No suspicious rapidly enhancing masses or abnormal areas of enhancement in the right breast to suggest malignancy. Left breast: Large irregular biopsy proven malignancy involving a large portion of the lower outer left breast and extending to the dermis of the inferior left breast measures up to 5.4 cm AP, 2.3 cm transverse, and 3.8 cm craniocaudal. A 6 mm small irregular enhancing mass in the lower central left breast likely represents a satellite nodule (subtracted image 101), with several additional small satellite nodules identified, including a small enhancing nodule in the upper-outer quadrant of the left breast (subtracted image 87) and a small enhancing nodule  just superior to the dominant mass (subtracted image 83). Lymph nodes: No internal mammary lymphadenopathy. No morphologically abnormal axillary lymph nodes. Ancillary findings:  None. IMPRESSION: 1. Biopsy proven malignancy predominantly involving the lower outer left breast and extending to the dermis of the inferior breast measures 5.4 x 2.3 x 3.8 cm with several surrounding satellite nodules. 2.  No MRI evidence of malignancy in the right breast. RECOMMENDATION: Treatment plan for known left breast malignancy. BI-RADS CATEGORY  6: Known biopsy-proven malignancy. Electronically Signed   By: Everlean Alstrom M.D.   On: 08/09/2017 11:56   US Breast Ltd Uni Left Inc Axilla  Result Date: 07/21/2017 CLINICAL DATA:  Patient describes a new palpable lump within the left breast, between 3 and 5 o'clock, felt by patient and ordering physician. EXAM: 2D DIGITAL DIAGNOSTIC BILATERAL MAMMOGRAM WITH CAD AND ADJUNCT TOMO ULTRASOUND LEFT BREAST COMPARISON:  Previous exam(s). ACR Breast Density Category c: The breast tissue is heterogeneously dense, which may obscure small masses. FINDINGS:  Bilateral 2D CC and MLO views were obtained today, with additional 3D tomosynthesis, and with additional spot compression view of the outer left breast corresponding to the area of clinical concern, with overlying skin marker in place. There is a new area of architectural distortion within the outer left breast, 4 o'clock axis region, at posterior depth. Partially obscured masses are seen within the area of architectural distortion. Mammographic images were processed with CAD. On physical exam, there is a firm palpable mass within the outer left breast. Targeted ultrasound is performed, showing an irregular hypoechoic mass within the left breast at the 3:30 o'clock axis, 4 cm from nipple, measuring 2.6 x 1.8 x 1.9 cm, corresponding to the mammographic finding. Additional contiguous component is seen at the 4 o'clock axis, 4 cm from  nipple, measuring 1.4 cm greatest dimension. Overall, the dominant component of the mass and the contiguous smaller component measure approximately 4 cm greatest dimension. Left axilla was evaluated with ultrasound showing no enlarged or morphologically abnormal lymph nodes. IMPRESSION: 1. Highly suspicious mass within the left breast, dominant component at the 3:30 o'clock axis, smaller contiguous component at the 4 o'clock axis, dominant component measuring 2.6 cm greatest dimension, overall measuring approximately 4 cm extent. Ultrasound-guided biopsy is recommended. 2.  No evidence of malignancy within the right breast. RECOMMENDATION: Ultrasound-guided biopsy of the left breast mass. Ultrasound-guided biopsy will be performed later today. I have discussed the findings and recommendations with the patient. Results were also provided in writing at the conclusion of the visit. If applicable, a reminder letter will be sent to the patient regarding the next appointment. BI-RADS CATEGORY  5: Highly suggestive of malignancy. Electronically Signed   By: Franki Cabot M.D.   On: 07/21/2017 14:40   Mm Diag Breast Tomo Bilateral  Result Date: 07/21/2017 CLINICAL DATA:  Patient describes a new palpable lump within the left breast, between 3 and 5 o'clock, felt by patient and ordering physician. EXAM: 2D DIGITAL DIAGNOSTIC BILATERAL MAMMOGRAM WITH CAD AND ADJUNCT TOMO ULTRASOUND LEFT BREAST COMPARISON:  Previous exam(s). ACR Breast Density Category c: The breast tissue is heterogeneously dense, which may obscure small masses. FINDINGS: Bilateral 2D CC and MLO views were obtained today, with additional 3D tomosynthesis, and with additional spot compression view of the outer left breast corresponding to the area of clinical concern, with overlying skin marker in place. There is a new area of architectural distortion within the outer left breast, 4 o'clock axis region, at posterior depth. Partially obscured masses are seen  within the area of architectural distortion. Mammographic images were processed with CAD. On physical exam, there is a firm palpable mass within the outer left breast. Targeted ultrasound is performed, showing an irregular hypoechoic mass within the left breast at the 3:30 o'clock axis, 4 cm from nipple, measuring 2.6 x 1.8 x 1.9 cm, corresponding to the mammographic finding. Additional contiguous component is seen at the 4 o'clock axis, 4 cm from nipple, measuring 1.4 cm greatest dimension. Overall, the dominant component of the mass and the contiguous smaller component measure approximately 4 cm greatest dimension. Left axilla was evaluated with ultrasound showing no enlarged or morphologically abnormal lymph nodes. IMPRESSION: 1. Highly suspicious mass within the left breast, dominant component at the 3:30 o'clock axis, smaller contiguous component at the 4 o'clock axis, dominant component measuring 2.6 cm greatest dimension, overall measuring approximately 4 cm extent. Ultrasound-guided biopsy is recommended. 2.  No evidence of malignancy within the right breast. RECOMMENDATION: Ultrasound-guided biopsy of the left  breast mass. Ultrasound-guided biopsy will be performed later today. I have discussed the findings and recommendations with the patient. Results were also provided in writing at the conclusion of the visit. If applicable, a reminder letter will be sent to the patient regarding the next appointment. BI-RADS CATEGORY  5: Highly suggestive of malignancy. Electronically Signed   By: Franki Cabot M.D.   On: 07/21/2017 14:40   Mm Clip Placement Left  Result Date: 07/21/2017 CLINICAL DATA:  Status post ultrasound-guided core biopsy of mass in the 4 o'clock location of the left breast. EXAM: DIAGNOSTIC LEFT MAMMOGRAM POST ULTRASOUND BIOPSY COMPARISON:  Previous exam(s). FINDINGS: Mammographic images were obtained following ultrasound guided biopsy of mass in the 4 o'clock location of the left breast  assist. A ribbon shaped clip is identified in lower outer quadrant of the left breast following biopsy, within the area of distortion and asymmetry. IMPRESSION: Tissue marker clip in the expected location following biopsy. Final Assessment: Post Procedure Mammograms for Marker Placement Electronically Signed   By: Nolon Nations M.D.   On: 07/21/2017 15:45   [place] Lt Breast Bx W Loc Dev 1st Lesion Img Bx Spec US Guide  Addendum Date: 07/24/2017   ADDENDUM REPORT: 07/24/2017 14:35 ADDENDUM: Pathology revealed GRADE II INVASIVE AND IN SITU MAMMARY CARCINOMA WITH CALCIFICATIONS of the Left breast, 4:00 o'clock. This was found to be concordant by Dr. Nolon Nations. Pathology results were discussed with the patient and her husband by telephone. The patient reported doing well after the biopsy with tenderness at the site. Post biopsy instructions and care were reviewed and questions were answered. The patient was encouraged to call [facility] for any additional concerns. Surgical consultation has been arranged with Dr. Erroll Luna at [facility] on July 31, 2017. Pathology results reported by Terie Purser, RN on 07/24/2017. Electronically Signed   By: Nolon Nations M.D.   On: 07/24/2017 14:35   Result Date: 07/24/2017 CLINICAL DATA:  Palpable mass in the lower outer quadrant of the left breast. EXAM: ULTRASOUND GUIDED LEFT BREAST CORE NEEDLE BIOPSY COMPARISON:  Previous exam(s). FINDINGS: I met with the patient and we discussed the procedure of ultrasound-guided biopsy, including benefits and alternatives. We discussed the high likelihood of a successful procedure. We discussed the risks of the procedure, including infection, bleeding, tissue injury, clip migration, and inadequate sampling. Informed written consent was given. The usual time-out protocol was performed immediately prior to the procedure. Lesion quadrant: Lower outer quadrant left breast Using sterile  technique and 1% Lidocaine as local anesthetic, under direct ultrasound visualization, a 12 gauge spring-loaded device was used to perform biopsy of mass in the 4 o'clock location of the left brow best using a lateral approach. At the conclusion of the procedure a ribbon shaped tissue marker clip was deployed into the biopsy cavity. Follow up 2 view mammogram was performed and dictated separately. IMPRESSION: Ultrasound guided biopsy of left breast mass. No apparent complications. Electronically Signed: By: Nolon Nations M.D. On: 07/21/2017 16:56      IMPRESSION/PLAN: Left breast cancer, currently on Letrozole  Oncotype test is pending. The patient is determining whether neoadjuvant chemotherapy is necessary or advisable, and she is motivated to preserve her breast as long as it does not compromise her overall prognosis. We discussed that radiotherapy would be indicated in the setting of breast conservation and less likely in the setting of post-mastectomy. We discussed that radiotherapy, if needed, would take place over a period of 4-6  weeks. We also discussed the reasons which could prompt post-mastectomy radiation.  She understands I'll be available to see her as needed if radiotherapy is warranted adjuvantly. Otherwise I wished her the best.   I spent over 45 minutes  face to face with the patient and more than 50% of that time was spent in counseling and/or coordination of care.   __________________________________________   Eppie Gibson, MD   This document serves as a record of services personally performed by Eppie Gibson, MD. It was created on his behalf by Linward Natal, a trained medical scribe. The creation of this record is based on the scribe's personal observations and the provider's statements to them. This document has been checked and approved by the attending provider.

## 2017-08-18 ENCOUNTER — Ambulatory Visit (INDEPENDENT_AMBULATORY_CARE_PROVIDER_SITE_OTHER): Payer: Medicare Other | Admitting: Family Medicine

## 2017-08-18 ENCOUNTER — Encounter: Payer: Self-pay | Admitting: Family Medicine

## 2017-08-18 DIAGNOSIS — R600 Localized edema: Secondary | ICD-10-CM | POA: Diagnosis not present

## 2017-08-18 LAB — BASIC METABOLIC PANEL
BUN: 24 mg/dL — ABNORMAL HIGH (ref 6–23)
CHLORIDE: 101 meq/L (ref 96–112)
CO2: 32 meq/L (ref 19–32)
CREATININE: 0.99 mg/dL (ref 0.40–1.20)
Calcium: 9.5 mg/dL (ref 8.4–10.5)
GFR: 58.9 mL/min — ABNORMAL LOW (ref >60.00)
GLUCOSE: 98 mg/dL (ref 70–99)
Potassium: 3.8 mEq/L (ref 3.5–5.1)
Sodium: 139 mEq/L (ref 135–145)

## 2017-08-18 NOTE — Progress Notes (Signed)
Patient ID: Amanda [name], female    DOB: 11/18/1945  Age: 71 y.o. MRN: [id number]    Subjective:  Subjective  HPI Amanda [name] presents for swelling in both feet/ ankles--- R >L   No sob, no chest pain  She was just dx with breast cancer.    Review of Systems  Constitutional: Negative for activity change, appetite change, fatigue and unexpected weight change.  Respiratory: Negative for cough and shortness of breath.   Cardiovascular: Positive for leg swelling. Negative for chest pain and palpitations.  Psychiatric/Behavioral: Negative for behavioral problems and dysphoric mood. The patient is not nervous/anxious.     History Past Medical History:  Diagnosis Date  . Frequency of urination   . GAD (generalized anxiety disorder)   . Osteopenia   . Parkinson disease Amanda [name])    neurologist-  dr Amanda [name]--  right side predominant  . Prolapse of vaginal wall   . Urgency of urination   . Wears glasses     She has a past surgical history that includes Laparoscopic ovarian cystectomy (March 2008); External ear surgery (Bilateral, age 30); Colonoscopy (last one 2007); Esophagogastroduodenoscopy (2007); Cystoscopy w/ ureteral stent placement (Bilateral, 01/11/2016); Cystocele repair (N/A, 01/11/2016); Pubovaginal sling (N/A, 01/11/2016); Vaginal hysterectomy (N/A, 01/11/2016); and Rectocele repair (N/A, 01/11/2016).   Her family history includes COPD in her sister; Cancer in her father; Edema in her sister; Heart disease in her brother and mother; Polycystic kidney disease in her son; Pulmonary embolism in her sister.She reports that  has never smoked. she has never used smokeless tobacco. She reports that she does not drink alcohol or use drugs.  Current Outpatient Medications on File Prior to Visit  Medication Sig Dispense Refill  . b complex vitamins tablet Take 1 tablet by mouth daily. MEGA FOOD BALANCE SOURCE    . Cholecalciferol (VITAMIN D3) 1000 UNIT/SPRAY LIQD Take 2 sprays by mouth 2  (two) times daily.     . hydrochlorothiazide (HYDRODIURIL) 25 MG tablet Take 1 tablet (25 mg total) by mouth daily. (Patient taking differently: Take 25 mg by mouth daily. As needed for swelling in her ankles) 30 tablet 0  . letrozole (FEMARA) 2.5 MG tablet Take 1 tablet (2.5 mg total) by mouth daily. 90 tablet 3  . NON FORMULARY Grow Bone Raw Calcium/K+/ Magnesium/ D--   2 caps twice a day    . pramipexole (MIRAPEX) 0.75 MG tablet 0.75 mg 3 (three) times daily.  4  . Probiotic Product (PROBIOTIC DAILY) CAPS Take by mouth as needed. Healthy Intestinal Flora    . sertraline (ZOLOFT) 50 MG tablet Take 50 mg by mouth daily.      No current facility-administered medications on file prior to visit.      Objective:  Objective  Physical Exam  Constitutional: She is oriented to person, place, and time. She appears well-developed and well-nourished.  HENT:  Head: Normocephalic and atraumatic.  Eyes: Conjunctivae and EOM are normal.  Neck: Normal range of motion. Neck supple. No JVD present. Carotid bruit is not present. No thyromegaly present.  Cardiovascular: Normal rate, regular rhythm and normal heart sounds.  No murmur heard. Pulmonary/Chest: Effort normal and breath sounds normal. No respiratory distress. She has no wheezes. She has no rales. She exhibits no tenderness.  Musculoskeletal: She exhibits edema.  Neurological: She is alert and oriented to person, place, and time.  Psychiatric: She has a normal mood and affect.  Nursing note and vitals reviewed.  BP (!) 108/50 (BP Location: Left Arm, Cuff  Size: Normal)   Pulse 63   Temp (!) 97.5 F (36.4 C) (Oral)   Resp 16   Ht 5\' 4"  (1.626 m)   Wt 128 lb 3.2 oz (58.2 kg)   SpO2 98%   BMI 22.01 kg/m  Wt Readings from Last 3 Encounters:  08/18/17 128 lb 3.2 oz (58.2 kg)  08/15/17 127 lb 9.6 oz (57.9 kg)  08/14/17 127 lb (57.6 kg)     Lab Results  Component Value Date   WBC 7.0 05/09/2016   HGB 13.0 05/09/2016   HCT 39.1  05/09/2016   PLT 208.0 05/09/2016   GLUCOSE 98 08/18/2017   CHOL 156 05/09/2016   TRIG 94.0 05/09/2016   HDL 64.80 05/09/2016   LDLCALC 73 05/09/2016   ALT 15 05/09/2016   AST 18 05/09/2016   NA 139 08/18/2017   K 3.8 08/18/2017   CL 101 08/18/2017   CREATININE 0.99 08/18/2017   BUN 24 (H) 08/18/2017   CO2 32 08/18/2017   TSH 1.00 08/17/2011    No results found.   Assessment & Plan:  Plan  I have discontinued Amanda [name]'s Nutritional Supplements (DHEA PO). I am also having her maintain her b complex vitamins, Vitamin D3, pramipexole, NON FORMULARY, PROBIOTIC DAILY, sertraline, hydrochlorothiazide, and letrozole.  No orders of the defined types were placed in this encounter.   Problem List Items Addressed This Visit      Unprioritized   Edema, lower extremity    Elevate legs Compression socks con't prn hctz--- can break in half       Relevant Orders   Basic metabolic panel (Completed)      Follow-up: Return if symptoms worsen or fail to improve.  Amanda Held, DO

## 2017-08-18 NOTE — Progress Notes (Deleted)
Patient ID: Amanda [name], female   DOB: 01-29-1946, 71 y.o.   MRN: [id number]     Subjective:  I acted as a Education administrator for Dr. Carollee Herter.  Guerry Bruin, [facility]   Patient ID: Amanda [name], female    DOB: 09-Oct-1945, 71 y.o.   MRN: [id number]  Chief Complaint  Patient presents with  . Edema    ankle swelling both feet    HPI  Patient is in today for follow up ankle swelling.    Patient Care Team: Carollee Herter, Alferd Apa, DO as PCP - General Star Age, MD as Consulting Physician (Neurology) Dian Queen, MD as Consulting Physician (Obstetrics and Gynecology) Lyndal Pulley, DO as Consulting Physician (Family Medicine) Eldridge Abrahams, MD as Consulting Physician (Neurology) Carolan Clines, MD as Consulting Physician (Urology) Jola Schmidt, MD as Consulting Physician (Ophthalmology)   Past Medical History:  Diagnosis Date  . Frequency of urination   . GAD (generalized anxiety disorder)   . Osteopenia   . Parkinson disease [facility])    neurologist-  dr Arvella Nigh--  right side predominant  . Prolapse of vaginal wall   . Urgency of urination   . Wears glasses     Past Surgical History:  Procedure Laterality Date  . COLONOSCOPY  last one 2007  . CYSTOCELE REPAIR N/A 01/11/2016   Procedure: ANTERIOR REPAIR (CYSTOCELE) COLOPLAST FASCIA  SACROSPINOUS FIXATION;  Surgeon: Carolan Clines, MD;  Location: [place];  Service: Urology;  Laterality: N/A;  . CYSTOSCOPY W/ URETERAL STENT PLACEMENT Bilateral 01/11/2016   Procedure: CYSTOSCOPY WITH BILATERAL RETROGRADE PYELOGRAM/URETERAL STENT PLACEMENT;  Surgeon: Carolan Clines, MD;  Location: [place];  Service: Urology;  Laterality: Bilateral;  . ESOPHAGOGASTRODUODENOSCOPY  2007  . EXTERNAL EAR SURGERY Bilateral age 13  . LAPAROSCOPIC OVARIAN CYSTECTOMY  March 2008   unilateral  . PUBOVAGINAL SLING N/A 01/11/2016   Procedure:  ALTIS SLING SINGLE INCISION;  Surgeon: Carolan Clines, MD;   Location: [facility];  Service: Urology;  Laterality: N/A;  . RECTOCELE REPAIR N/A 01/11/2016   Procedure: POSTERIOR REPAIR (RECTOCELE), PERINEAPLASTY;  Surgeon: Dian Queen, MD;  Location: [place];  Service: Gynecology;  Laterality: N/A;  . VAGINAL HYSTERECTOMY N/A 01/11/2016   Procedure: TOTAL VAGINAL HYSTERECTOMY ;  Surgeon: Dian Queen, MD;  Location: [place];  Service: Gynecology;  Laterality: N/A;    Family History  Problem Relation Age of Onset  . Heart disease Mother        enlarged heart  . Cancer Father        pancreatic  . COPD Sister   . Pulmonary embolism Sister   . Edema Sister        Ankles  . Heart disease Brother   . Polycystic kidney disease Son     Social History   Socioeconomic History  . Marital status: Married    Spouse name: Chrissie Noa  . Number of children: 3  . Years of education: College  . Highest education level: Not on file  Social Needs  . Financial resource strain: Not on file  . Food insecurity - worry: Not on file  . Food insecurity - inability: Not on file  . Transportation needs - medical: Not on file  . Transportation needs - non-medical: Not on file  Occupational History  . Occupation: retired  Tobacco Use  . Smoking status: Never Smoker  . Smokeless tobacco: Never Used  Substance and Sexual Activity  . Alcohol use: No  . Drug  use: No  . Sexual activity: Not on file  Other Topics Concern  . Not on file  Social History Narrative   Pt lives at home with family.   Caffeine Use: some, occasionally    Outpatient Medications Prior to Visit  Medication Sig Dispense Refill  . b complex vitamins tablet Take 1 tablet by mouth daily. MEGA FOOD BALANCE SOURCE    . Cholecalciferol (VITAMIN D3) 1000 UNIT/SPRAY LIQD Take 2 sprays by mouth 2 (two) times daily.     . hydrochlorothiazide (HYDRODIURIL) 25 MG tablet Take 1 tablet (25 mg total) by mouth daily. (Patient taking differently:  Take 25 mg by mouth daily. As needed for swelling in her ankles) 30 tablet 0  . letrozole (FEMARA) 2.5 MG tablet Take 1 tablet (2.5 mg total) by mouth daily. 90 tablet 3  . NON FORMULARY Grow Bone Raw Calcium/K+/ Magnesium/ D--   2 caps twice a day    . pramipexole (MIRAPEX) 0.75 MG tablet 0.75 mg 3 (three) times daily.  4  . Probiotic Product (PROBIOTIC DAILY) CAPS Take by mouth as needed. Healthy Intestinal Flora    . sertraline (ZOLOFT) 50 MG tablet Take 50 mg by mouth daily.     . Nutritional Supplements (DHEA PO) Take 5 mg by mouth at bedtime.      No facility-administered medications prior to visit.     Allergies  Allergen Reactions  . Penicillins Anaphylaxis    Has patient had a PCN reaction causing immediate rash, facial/tongue/throat swelling, SOB or lightheadedness with hypotension: No, just leg cramps Has patient had a PCN reaction causing severe rash involving mucus membranes or skin necrosis: No Has patient had a PCN reaction that required hospitalization Unknown Has patient had a PCN reaction occurring within the last 10 years:  No If all of the above answers are "NO", then may proceed with Cephalosporin use.     Review of Systems  Constitutional: Negative for fever and malaise/fatigue.  HENT: Negative for congestion.   Eyes: Negative for blurred vision.  Respiratory: Negative for cough and shortness of breath.   Cardiovascular: Negative for chest pain, palpitations and leg swelling.  Gastrointestinal: Negative for vomiting.  Musculoskeletal: Negative for back pain.  Skin: Negative for rash.  Neurological: Negative for loss of consciousness and headaches.       Objective:    Physical Exam  BP (!) 108/50 (BP Location: Left Arm, Cuff Size: Normal)   Pulse 63   Temp (!) 97.5 F (36.4 C) (Oral)   Resp 16   Ht 5\' 4"  (1.626 m)   Wt 128 lb 3.2 oz (58.2 kg)   SpO2 98%   BMI 22.01 kg/m  Wt Readings from Last 3 Encounters:  08/18/17 128 lb 3.2 oz (58.2 kg)    08/15/17 127 lb 9.6 oz (57.9 kg)  08/14/17 127 lb (57.6 kg)   BP Readings from Last 3 Encounters:  08/18/17 (!) 108/50  08/15/17 (!) 110/48  08/14/17 130/64     Immunization History  Administered Date(s) Administered  . Influenza Split 08/17/2011  . Influenza Whole 10/21/2009  . Influenza, High Dose Seasonal PF 05/18/2017  . Influenza,inj,Quad PF,6+ Mos 07/12/2013  . Pneumococcal Conjugate-13 03/05/2015  . Pneumococcal Polysaccharide-23 03/04/2016  . Td 04/22/2004, 10/21/2009  . Tdap 05/09/2016  . Zoster 06/26/2008    Health Maintenance  Topic Date Due  . Hepatitis C Screening  1945/10/26  . Fecal DNA (Cologuard)  05/22/1997  . MAMMOGRAM  07/21/2018  . TETANUS/TDAP  05/09/2026  .  INFLUENZA VACCINE  Completed  . DEXA SCAN  Completed  . PNA vac Low Risk Adult  Completed    Lab Results  Component Value Date   WBC 7.0 05/09/2016   HGB 13.0 05/09/2016   HCT 39.1 05/09/2016   PLT 208.0 05/09/2016   GLUCOSE 88 05/02/2017   CHOL 156 05/09/2016   TRIG 94.0 05/09/2016   HDL 64.80 05/09/2016   LDLCALC 73 05/09/2016   ALT 15 05/09/2016   AST 18 05/09/2016   NA 140 05/02/2017   K 3.7 05/02/2017   CL 103 05/02/2017   CREATININE 0.87 05/02/2017   BUN 17 05/02/2017   CO2 32 05/02/2017   TSH 1.00 08/17/2011    Lab Results  Component Value Date   TSH 1.00 08/17/2011   Lab Results  Component Value Date   WBC 7.0 05/09/2016   HGB 13.0 05/09/2016   HCT 39.1 05/09/2016   MCV 87.5 05/09/2016   PLT 208.0 05/09/2016   Lab Results  Component Value Date   NA 140 05/02/2017   K 3.7 05/02/2017   CO2 32 05/02/2017   GLUCOSE 88 05/02/2017   BUN 17 05/02/2017   CREATININE 0.87 05/02/2017   BILITOT 0.3 05/09/2016   ALKPHOS 59 05/09/2016   AST 18 05/09/2016   ALT 15 05/09/2016   PROT 6.7 05/09/2016   ALBUMIN 4.1 05/09/2016   CALCIUM 9.5 05/02/2017   ANIONGAP 6 01/13/2016   GFR 68.43 05/02/2017   Lab Results  Component Value Date   CHOL 156 05/09/2016   Lab  Results  Component Value Date   HDL 64.80 05/09/2016   Lab Results  Component Value Date   LDLCALC 73 05/09/2016   Lab Results  Component Value Date   TRIG 94.0 05/09/2016   Lab Results  Component Value Date   CHOLHDL 2 05/09/2016   No results found for: HGBA1C       Assessment & Plan:   Problem List Items Addressed This Visit    None      I have discontinued Jana Half Beretta's Nutritional Supplements (DHEA PO). I am also having her maintain her b complex vitamins, Vitamin D3, pramipexole, NON FORMULARY, PROBIOTIC DAILY, sertraline, hydrochlorothiazide, and letrozole.  No orders of the defined types were placed in this encounter.   {PROVIDER TO DELETE} Jerene Dilling, CMA

## 2017-08-18 NOTE — Patient Instructions (Signed)
Edema Edema is an abnormal buildup of fluids in your bodytissues. Edema is somewhatdependent on gravity to pull the fluid to the lowest place in your body. That makes the condition more common in the legs and thighs (lower extremities). Painless swelling of the feet and ankles is common and becomes more likely as you get older. It is also common in looser tissues, like around your eyes. When the affected area is squeezed, the fluid may move out of that spot and leave a dent for a few moments. This dent is called pitting. What are the causes? There are many possible causes of edema. Eating too much salt and being on your feet or sitting for a long time can cause edema in your legs and ankles. Hot weather may make edema worse. Common medical causes of edema include:  Heart failure.  Liver disease.  Kidney disease.  Weak blood vessels in your legs.  Cancer.  An injury.  Pregnancy.  Some medications.  Obesity.  What are the signs or symptoms? Edema is usually painless.Your skin may look swollen or shiny. How is this diagnosed? Your health care provider may be able to diagnose edema by asking about your medical history and doing a physical exam. You may need to have tests such as X-rays, an electrocardiogram, or blood tests to check for medical conditions that may cause edema. How is this treated? Edema treatment depends on the cause. If you have heart, liver, or kidney disease, you need the treatment appropriate for these conditions. General treatment may include:  Elevation of the affected body part above the level of your heart.  Compression of the affected body part. Pressure from elastic bandages or support stockings squeezes the tissues and forces fluid back into the blood vessels. This keeps fluid from entering the tissues.  Restriction of fluid and salt intake.  Use of a water pill (diuretic). These medications are appropriate only for some types of edema. They pull fluid  out of your body and make you urinate more often. This gets rid of fluid and reduces swelling, but diuretics can have side effects. Only use diuretics as directed by your health care provider.  Follow these instructions at home:  Keep the affected body part above the level of your heart when you are lying down.  Do not sit still or stand for prolonged periods.  Do not put anything directly under your knees when lying down.  Do not wear constricting clothing or garters on your upper legs.  Exercise your legs to work the fluid back into your blood vessels. This may help the swelling go down.  Wear elastic bandages or support stockings to reduce ankle swelling as directed by your health care provider.  Eat a low-salt diet to reduce fluid if your health care provider recommends it.  Only take medicines as directed by your health care provider. Contact a health care provider if:  Your edema is not responding to treatment.  You have heart, liver, or kidney disease and notice symptoms of edema.  You have edema in your legs that does not improve after elevating them.  You have sudden and unexplained weight gain. Get help right away if:  You develop shortness of breath or chest pain.  You cannot breathe when you lie down.  You develop pain, redness, or warmth in the swollen areas.  You have heart, liver, or kidney disease and suddenly get edema.  You have a fever and your symptoms suddenly get worse. This information is   not intended to replace advice given to you by your health care provider. Make sure you discuss any questions you have with your health care provider. Document Released: 09/05/2005 Document Revised: 02/11/2016 Document Reviewed: 06/28/2013 Elsevier Interactive Patient Education  2017 Elsevier Inc.  

## 2017-08-18 NOTE — Assessment & Plan Note (Signed)
Elevate legs Compression socks con't prn hctz--- can break in half

## 2017-08-23 ENCOUNTER — Encounter: Payer: Self-pay | Admitting: *Deleted

## 2017-08-25 ENCOUNTER — Telehealth: Payer: Self-pay | Admitting: *Deleted

## 2017-08-25 NOTE — Telephone Encounter (Signed)
Received oncotype score of 10. Physician team notified Called pt with results and discussed pt does not need chemotherapy.

## 2017-08-31 ENCOUNTER — Encounter (HOSPITAL_COMMUNITY): Payer: Self-pay

## 2017-09-08 ENCOUNTER — Telehealth: Payer: Self-pay | Admitting: Family Medicine

## 2017-09-08 NOTE — Telephone Encounter (Signed)
Patient called with questions about when she started her Zoloft and HCTZ. She reports she is having some swelling in her ankles and she wants to discuss when she started the Zoloft- and if that caused it. She also wants to discuss support hose and her wish not to take daily medication for this. Answered questions about diet, elevation, getting fitting for support stockings for appropriate fit, and staying hydrated. She is still using the Zoloft and she agrees that it is helping her anxiety. She had questions about how long it was safe to use and she was informed she could use it as long as it was beneficial to her. She will call back if she feels she needs to schedule an appointment to discuss changes in her management of her swelling in her ankles.

## 2017-09-10 ENCOUNTER — Other Ambulatory Visit: Payer: Self-pay | Admitting: Family Medicine

## 2017-09-10 DIAGNOSIS — R600 Localized edema: Secondary | ICD-10-CM

## 2017-09-11 NOTE — Telephone Encounter (Signed)
Called Pt and she states that's she's doing better but still having some swelling. Pt states that she now knows the Zoloft isn't causing the swelling but wants to know why she's swelling and where I might be coming from. After talking for a while Pt admitted that she does have somewhat of a high sodium intake and has not been taking HCTZ as advised. I advised Pt to take as directed as well as trying to keep feet elevated throughout the night and at night if permitted. She stated she'd comply and would call us in the event anything got worse or did not get better.

## 2017-09-13 ENCOUNTER — Other Ambulatory Visit: Payer: Self-pay | Admitting: Family Medicine

## 2017-09-13 ENCOUNTER — Ambulatory Visit: Payer: Self-pay | Admitting: Hematology and Oncology

## 2017-09-13 DIAGNOSIS — R600 Localized edema: Secondary | ICD-10-CM

## 2017-09-20 NOTE — Assessment & Plan Note (Signed)
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 After reviewing the pathology in detail, we proceeded to discuss the different treatment options between surgery, radiation, chemotherapy, antiestrogen therapies.  Recommendation: 1.  Neoadjuvant therapy with hormonal therapy based upon low risk Oncotype DX test result 2. on letrozole 2.5 mg daily 3.  6 months of neoadjuvant therapy followed by surgery depending on the degree of response, patient may be eligible for lumpectomy versus mastectomy 4.  Followed by adjuvant radiation 5.  Followed by antiestrogen therapy ---------------------------------------------------------------- Letrozole Toxicities:  Return to clinic in 3 months for follow up

## 2017-09-21 ENCOUNTER — Telehealth: Payer: Self-pay | Admitting: Hematology and Oncology

## 2017-09-21 ENCOUNTER — Inpatient Hospital Stay: Payer: Medicare Other | Attending: Hematology and Oncology | Admitting: Hematology and Oncology

## 2017-09-21 DIAGNOSIS — C50512 Malignant neoplasm of lower-outer quadrant of left female breast: Secondary | ICD-10-CM

## 2017-09-21 DIAGNOSIS — Z170 Estrogen receptor positive status [ER+]: Secondary | ICD-10-CM

## 2017-09-21 NOTE — Progress Notes (Signed)
Patient Care Team: Carollee Herter, Alferd Apa, DO as PCP - General Star Age, MD as Consulting Physician (Neurology) Dian Queen, MD as Consulting Physician (Obstetrics and Gynecology) Lyndal Pulley, DO as Consulting Physician (Family Medicine) Eldridge Abrahams, MD as Consulting Physician (Neurology) Carolan Clines, MD as Consulting Physician (Urology) Jola Schmidt, MD as Consulting Physician (Ophthalmology)  DIAGNOSIS:  Encounter Diagnosis  Name Primary?  . 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  She is tolerating letrozole fairly well.  She has intermittent hot flashes but they are not bothering her.  REVIEW OF SYSTEMS:   Constitutional: Denies fevers, chills or abnormal weight loss Eyes: Denies blurriness of vision Ears, nose, mouth, throat, and face: Denies mucositis or sore throat Respiratory: Denies cough, dyspnea or wheezes Cardiovascular: Denies palpitation, chest discomfort Gastrointestinal:  Denies nausea, heartburn or change in bowel  habits Skin: Denies abnormal skin rashes Lymphatics: Denies new lymphadenopathy or easy bruising Neurological:Denies numbness, tingling or new weaknesses Behavioral/Psych: Mood is stable, no new changes  Extremities: No lower extremity edema  All other systems were reviewed with the patient and are negative.  I have reviewed the past medical history, past surgical history, social history and family history with the patient and they are unchanged from previous note.  ALLERGIES:  is allergic to penicillins.  MEDICATIONS:  Current Outpatient Medications  Medication Sig Dispense Refill  . b complex vitamins tablet Take 1 tablet by mouth daily. MEGA FOOD BALANCE SOURCE    . Cholecalciferol (VITAMIN D3) 1000 UNIT/SPRAY LIQD Take 2 sprays by mouth 2 (two) times daily.     . hydrochlorothiazide (HYDRODIURIL) 25 MG tablet TAKE 1 TABLET BY MOUTH EVERY DAY 30 tablet 0  . letrozole (FEMARA) 2.5 MG tablet Take 1 tablet (2.5 mg total) by mouth daily. 90 tablet 3  . NON FORMULARY Grow Bone Raw Calcium/K+/ Magnesium/ D--   2 caps twice a day    . pramipexole (MIRAPEX) 0.75 MG tablet 0.75 mg 3 (three) times daily.  4  . Probiotic Product (PROBIOTIC DAILY) CAPS Take by mouth as needed. Healthy Intestinal Flora    . sertraline (ZOLOFT) 50 MG tablet Take 50 mg by mouth daily.      No current facility-administered medications for this visit.     PHYSICAL EXAMINATION: ECOG PERFORMANCE STATUS: 1 - Symptomatic but completely ambulatory  Vitals:   09/21/17 1459  BP: (!) 114/51  Pulse: 77  Resp: 18  Temp: 98.2 F (36.8 C)  SpO2: 100%   Filed Weights   09/21/17 1459  Weight: 128 lb (58.1  kg)    GENERAL:alert, no distress and comfortable SKIN: skin color, texture, turgor are normal, no rashes or significant lesions EYES: normal, Conjunctiva are pink and non-injected, sclera clear OROPHARYNX:no exudate, no erythema and lips, buccal mucosa, and tongue normal  NECK: supple, thyroid normal size,  non-tender, without nodularity LYMPH:  no palpable lymphadenopathy in the cervical, axillary or inguinal LUNGS: clear to auscultation and percussion with normal breathing effort HEART: regular rate & rhythm and no murmurs and no lower extremity edema ABDOMEN:abdomen soft, non-tender and normal bowel sounds MUSCULOSKELETAL:no cyanosis of digits and no clubbing  NEURO: alert & oriented x 3 with fluent speech, no focal motor/sensory deficits EXTREMITIES: No lower extremity edema  LABORATORY DATA:  I have reviewed the data as listed   Chemistry      Component Value Date/Time   NA 139 08/18/2017 1142   K 3.8 08/18/2017 1142   CL 101 08/18/2017 1142   CO2 32 08/18/2017 1142   BUN 24 (H) 08/18/2017 1142   CREATININE 0.99 08/18/2017 1142      Component Value Date/Time   CALCIUM 9.5 08/18/2017 1142   ALKPHOS 59 05/09/2016 1040   AST 18 05/09/2016 1040   ALT 15 05/09/2016 1040   BILITOT 0.3 05/09/2016 1040       Lab Results  Component Value Date   WBC 7.0 05/09/2016   HGB 13.0 05/09/2016   HCT 39.1 05/09/2016   MCV 87.5 05/09/2016   PLT 208.0 05/09/2016   NEUTROABS 5.1 05/09/2016    ASSESSMENT & PLAN:  Malignant neoplasm of lower-outer quadrant of left breast of female, estrogen receptor positive ([place]) 07/21/17: Left breast lower outer quadrant 5.4 x 2.3 x 3.8 cm mass along with additional satellite nodules measuring 6 mm and several other small satellite nodules, no lymphadenopathy, 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 After reviewing the pathology in detail, we proceeded to discuss the different treatment options between surgery,  radiation, chemotherapy, antiestrogen therapies.  Recommendation: 1.  Neoadjuvant therapy with hormonal therapy based upon low risk Oncotype DX test result 2. on letrozole 2.5 mg daily 3.  6 months of neoadjuvant therapy followed by surgery depending on the degree of response, patient may be eligible for lumpectomy versus mastectomy 4.  Followed by adjuvant radiation 5.  Followed by antiestrogen therapy ---------------------------------------------------------------- Letrozole Toxicities: Intermittent mild hot flashes Denies any arthralgias or myalgias.  Patient has severe essential tremor. I would like to obtain a mammogram and ultrasound in 2 months and follow-up after that. After she finishes 6 months of therapy then we can obtain a breast MRI. Return to clinic in 2 months for follow up   I spent 25 minutes talking to the patient of which more than half was spent in counseling and coordination of care.  Orders Placed This Encounter  Procedures  . MM DIAG BREAST TOMO UNI LEFT    Standing Status:   Future    Standing Expiration Date:   09/21/2018    Order Specific Question:   Reason for Exam (SYMPTOM  OR DIAGNOSIS REQUIRED)    Answer:   Left breast cancer on neoadjuvant hormonal therapy    Order Specific Question:   Preferred imaging  location?    Answer:   [facility]  . US BREAST LTD UNI LEFT INC AXILLA    Standing Status:   Future    Standing Expiration Date:   11/20/2018    Order Specific Question:   Reason for Exam (SYMPTOM  OR DIAGNOSIS REQUIRED)    Answer:   evaluate tumor size on neoadj hormonal therapy for breast cancer    Order Specific Question:   Preferred imaging location?    Answer:   [facility]   The patient has a good understanding of the overall plan. she agrees with it. she will call with any problems that may develop before the next visit here.   Harriette Ohara, MD 09/21/17

## 2017-09-21 NOTE — Telephone Encounter (Signed)
Gave patient AVs and calendar of upcoming March appointments. °

## 2017-11-13 ENCOUNTER — Ambulatory Visit
Admission: RE | Admit: 2017-11-13 | Discharge: 2017-11-13 | Disposition: A | Discharge status: Home / Self Care | Payer: Medicare Other | Source: Ambulatory Visit | Attending: Hematology and Oncology | Admitting: Hematology and Oncology

## 2017-11-13 DIAGNOSIS — C50512 Malignant neoplasm of lower-outer quadrant of left female breast: Secondary | ICD-10-CM

## 2017-11-13 DIAGNOSIS — Z170 Estrogen receptor positive status [ER+]: Principal | ICD-10-CM

## 2017-11-13 DIAGNOSIS — R922 Inconclusive mammogram: Secondary | ICD-10-CM | POA: Diagnosis not present

## 2017-11-13 DIAGNOSIS — N6489 Other specified disorders of breast: Secondary | ICD-10-CM | POA: Diagnosis not present

## 2017-11-16 DIAGNOSIS — F418 Other specified anxiety disorders: Secondary | ICD-10-CM | POA: Diagnosis not present

## 2017-11-16 DIAGNOSIS — C50912 Malignant neoplasm of unspecified site of left female breast: Secondary | ICD-10-CM | POA: Diagnosis not present

## 2017-11-16 DIAGNOSIS — R4189 Other symptoms and signs involving cognitive functions and awareness: Secondary | ICD-10-CM | POA: Diagnosis not present

## 2017-11-16 DIAGNOSIS — G20 Parkinson's disease: Secondary | ICD-10-CM | POA: Diagnosis not present

## 2017-11-16 DIAGNOSIS — Z79899 Other long term (current) drug therapy: Secondary | ICD-10-CM | POA: Diagnosis not present

## 2017-11-20 ENCOUNTER — Telehealth: Payer: Self-pay | Admitting: Hematology and Oncology

## 2017-11-20 ENCOUNTER — Inpatient Hospital Stay: Payer: Medicare Other | Attending: Hematology and Oncology | Admitting: Hematology and Oncology

## 2017-11-20 DIAGNOSIS — Z79899 Other long term (current) drug therapy: Secondary | ICD-10-CM | POA: Diagnosis not present

## 2017-11-20 DIAGNOSIS — Z79811 Long term (current) use of aromatase inhibitors: Secondary | ICD-10-CM | POA: Insufficient documentation

## 2017-11-20 DIAGNOSIS — Z880 Allergy status to penicillin: Secondary | ICD-10-CM | POA: Insufficient documentation

## 2017-11-20 DIAGNOSIS — Z923 Personal history of irradiation: Secondary | ICD-10-CM | POA: Diagnosis not present

## 2017-11-20 DIAGNOSIS — Z170 Estrogen receptor positive status [ER+]: Secondary | ICD-10-CM | POA: Diagnosis not present

## 2017-11-20 DIAGNOSIS — C50512 Malignant neoplasm of lower-outer quadrant of left female breast: Secondary | ICD-10-CM | POA: Insufficient documentation

## 2017-11-20 NOTE — Assessment & Plan Note (Signed)
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  Neoadjuvant therapy with hormonal therapy based upon low risk Oncotype DX test result (score 10: 7% ROR) 2. on letrozole 2.5 mg daily 3.  6 months of neoadjuvant therapy followed by surgery depending on the degree of response, patient may be eligible for lumpectomy versus mastectomy 4.  Followed by adjuvant radiation 5.  Followed by antiestrogen therapy ---------------------------------------------------------------- Letrozole Toxicities: Intermittent mild hot flashes Denies any arthralgias or myalgias.  Mammogram and ultrasound 11/13/2017: 330 position: Mass size is 1.8 cm.  This was previously 2.6 cm; 4 o'clock position: 1.2 cm (previously 1.4 cm)  Recommendation: Continuation of neoadjuvant hormonal therapy and obtain a breast MRI in 3 months.  Return to clinic in 3 months after breast MRI and follow-up

## 2017-11-20 NOTE — Progress Notes (Signed)
Patient Care Team: Carollee Herter, Alferd Apa, DO as PCP - General Star Age, MD as Consulting Physician (Neurology) Dian Queen, MD as Consulting Physician (Obstetrics and Gynecology) Lyndal Pulley, DO as Consulting Physician (Family Medicine) Eldridge Abrahams, MD as Consulting Physician (Neurology) Carolan Clines, MD as Consulting Physician (Urology) Jola Schmidt, MD as Consulting Physician (Ophthalmology)  DIAGNOSIS:  Encounter Diagnosis  Name Primary?  . 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  She appears to be tolerating it fairly well.  She had a recent mammogram and ultrasound is here today to discuss her report.  She has been tolerating letrozole extremely well.  She does not have any hot flashes or arthralgias or myalgias.  REVIEW OF SYSTEMS:   Constitutional: Denies fevers, chills or abnormal weight loss Eyes: Denies blurriness of vision Ears, nose, mouth, throat, and face: Denies mucositis or sore throat Respiratory: Denies cough, dyspnea or wheezes Cardiovascular: Denies  palpitation, chest discomfort Gastrointestinal:  Denies nausea, heartburn or change in bowel habits Skin: Denies abnormal skin rashes Lymphatics: Denies new lymphadenopathy or easy bruising Neurological:Denies numbness, tingling or new weaknesses Behavioral/Psych: Mood is stable, no new changes  Extremities: No lower extremity edema Breast:  denies any pain or lumps or nodules in either breasts All other systems were reviewed with the patient and are negative.  I have reviewed the past medical history, past surgical history, social history and family history with the patient and they are unchanged from previous note.  ALLERGIES:  is allergic to penicillins.  MEDICATIONS:  Current Outpatient Medications  Medication Sig Dispense Refill  . b complex vitamins tablet Take 1 tablet by mouth daily. MEGA FOOD BALANCE SOURCE    . Cholecalciferol (VITAMIN D3) 1000 UNIT/SPRAY LIQD Take 2 sprays by mouth 2 (two) times daily.     . hydrochlorothiazide (HYDRODIURIL) 25 MG tablet TAKE 1 TABLET BY MOUTH EVERY DAY 30 tablet 0  . letrozole (FEMARA) 2.5 MG tablet Take 1 tablet (2.5 mg total) by mouth daily. 90 tablet 3  . NON FORMULARY Grow Bone Raw Calcium/K+/ Magnesium/ D--   2 caps twice a day    . pramipexole (MIRAPEX) 0.75 MG tablet 0.75 mg 3 (three) times daily.  4  . Probiotic Product (PROBIOTIC DAILY) CAPS Take by mouth as needed. Healthy Intestinal Flora    . sertraline (ZOLOFT) 50 MG tablet Take 50 mg by mouth daily.      No current facility-administered medications for this visit.     PHYSICAL EXAMINATION: ECOG PERFORMANCE STATUS: 1 - Symptomatic but completely ambulatory  Vitals:   11/20/17 1135  BP: (!) 121/58  Pulse: 72  Resp: 17  Temp: 97.7 F (36.5 C)  SpO2: 100%   Filed Weights   11/20/17 1135  Weight: 128 lb 8 oz (58.3 kg)    GENERAL:alert, no distress and comfortable SKIN: skin color, texture, turgor are normal, no rashes or significant lesions EYES: normal,  Conjunctiva are pink and non-injected, sclera clear OROPHARYNX:no exudate, no erythema and lips, buccal mucosa, and tongue normal  NECK: supple, thyroid normal size, non-tender, without nodularity LYMPH:  no palpable lymphadenopathy in the cervical, axillary or inguinal LUNGS: clear to auscultation and percussion with normal breathing effort HEART: regular rate & rhythm and no murmurs and no lower extremity edema ABDOMEN:abdomen soft, non-tender and normal bowel sounds MUSCULOSKELETAL:no cyanosis of digits and no clubbing  NEURO: alert & oriented x 3 with fluent speech, no focal motor/sensory deficits EXTREMITIES: No lower extremity edema  LABORATORY DATA:  I have reviewed the data as listed CMP Latest Ref Rng & Units 08/18/2017 05/02/2017 05/09/2016  Glucose 70 - 99 mg/dL 98 88 55(L)  BUN 6 - 23 mg/dL 24(H) 17 20  Creatinine 0.40 - 1.20 mg/dL 0.99 0.87 0.92  Sodium 135 - 145 mEq/L 139 140 141  Potassium 3.5 - 5.1 mEq/L 3.8 3.7 3.7  Chloride 96 - 112 mEq/L 101 103 106  CO2 19 - 32 mEq/L 32 32 27  Calcium 8.4 - 10.5 mg/dL 9.5 9.5 9.2  Total Protein 6.0 - 8.3 g/dL - - 6.7  Total Bilirubin 0.2 - 1.2 mg/dL - - 0.3  Alkaline Phos 39 - 117 U/L - - 59  AST 0 - 37 U/L - - 18  ALT 0 - 35 U/L - - 15    Lab Results  Component Value Date   WBC 7.0 05/09/2016   HGB 13.0 05/09/2016   HCT 39.1 05/09/2016   MCV 87.5 05/09/2016   PLT 208.0 05/09/2016   NEUTROABS 5.1 05/09/2016    ASSESSMENT & PLAN:  Malignant neoplasm of lower-outer quadrant of left breast of female, estrogen receptor positive ([place]) 07/21/17: Left breast lower outer quadrant 5.4 x 2.3 x 3.8 cm mass along with additional satellite nodules measuring 6 mm and several other small satellite nodules, no lymphadenopathy, 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  Neoadjuvant therapy with hormonal therapy  based upon low risk Oncotype DX test result  (score 10: 7% ROR) 2. on letrozole 2.5 mg daily 3.  6 months of neoadjuvant therapy followed by surgery depending on the degree of response, patient may be eligible for lumpectomy versus mastectomy 4.  Followed by adjuvant radiation 5.  Followed by antiestrogen therapy ---------------------------------------------------------------- Letrozole Toxicities: Intermittent mild hot flashes Denies any arthralgias or myalgias.  Mammogram and ultrasound 11/13/2017: 330 position: Mass size is 1.8 cm.  This was previously 2.6 cm; 4 o'clock position: 1.2 cm (previously 1.4 cm)  Recommendation: Continuation of neoadjuvant hormonal therapy and obtain a breast MRI in 3 months. Patient had concerns about the dye used in breast MRI.  She believes that it has a metal in it and she is anxious about accumulating metal in her body. Return to clinic in 3 months after breast MRI and follow-up After that she will undergo surgical resection  I spent 25 minutes talking to the patient of which more than half was spent in counseling and coordination of care.  No orders of  the defined types were placed in this encounter.  The patient has a good understanding of the overall plan. she agrees with it. she will call with any problems that may develop before the next visit here.   Harriette Ohara, MD 11/20/17

## 2017-11-20 NOTE — Telephone Encounter (Signed)
Gave patient AVS and calendar of upcoming June appointments.  °

## 2017-12-27 ENCOUNTER — Telehealth: Payer: Self-pay | Admitting: Family Medicine

## 2017-12-27 DIAGNOSIS — R600 Localized edema: Secondary | ICD-10-CM

## 2017-12-27 NOTE — Telephone Encounter (Signed)
Copied from [place] [id number]. Topic: Quick Communication - See Telephone Encounter >> Dec 27, 2017 11:51 AM Ether Griffins B wrote: CRM for notification. See Telephone encounter for: 12/27/17.  Pt is needing a prescription sent in for compression stockings. She is still having swelling in her ankles. Also she is inquring if Dr. Carollee Herter could write her a letter stating she is unable to do jury duty due to having breast cancer and parkinson's. She has appts in June for MRI and [place] and follow up with oncologist. She is worrying a trial could take place and she would miss these appts in June.

## 2017-12-28 NOTE — Telephone Encounter (Signed)
We just need her jury duty letter and then we can write a note

## 2017-12-29 MED ORDER — AMBULATORY NON FORMULARY MEDICATION
0 refills | Status: DC
Start: 2017-12-29 — End: 2023-10-05

## 2017-12-29 NOTE — Addendum Note (Signed)
Addended by: Kem Boroughs D on: 12/29/2017 04:09 PM   Modules accepted: Orders

## 2017-12-29 NOTE — Telephone Encounter (Signed)
Note written and patient notified she will be in on Monday.

## 2017-12-29 NOTE — Telephone Encounter (Signed)
Pt will callback again with juror number and date

## 2017-12-29 NOTE — Telephone Encounter (Signed)
Left message on machine to call back to give juror number and dates that she is suppose to be there.

## 2017-12-29 NOTE — Telephone Encounter (Signed)
Juror # [id number] Date: 5.28.19 @ 8:15am  Pt asked about the stockings as well, contact pt to advise

## 2018-01-03 NOTE — Telephone Encounter (Signed)
Pt came to office to pick up letter for jury duty and also to pick up rx for compression for knee, pt notice that letter had written down a small detail incorrect and would like to have it corrected. Document at front office. (pt did take with her the rx that was ready)

## 2018-01-04 ENCOUNTER — Encounter: Payer: Self-pay | Admitting: *Deleted

## 2018-01-04 NOTE — Telephone Encounter (Signed)
Left message on machine that letter is ready for pickup

## 2018-01-25 ENCOUNTER — Telehealth: Payer: Self-pay

## 2018-01-25 ENCOUNTER — Other Ambulatory Visit: Payer: Self-pay

## 2018-01-25 DIAGNOSIS — C50512 Malignant neoplasm of lower-outer quadrant of left female breast: Secondary | ICD-10-CM

## 2018-01-25 DIAGNOSIS — Z170 Estrogen receptor positive status [ER+]: Principal | ICD-10-CM

## 2018-01-25 NOTE — Progress Notes (Signed)
Pt called left VM, she prefers to have MRI of breast of [facility] due to pt reports: she is somewhat claustrophobic and they have a bigger machine. New order placed for [facility] and I will contact [facility] to let her know the location was changed from [facility] to [facility].  I will call pt back to let her know and [facility] will make them aware as well.

## 2018-01-25 NOTE — Telephone Encounter (Signed)
Pt requested MRI of breast be switched to [facility] location.  (See previous note).  I called [facility] and they can see the order so pt just has to call to make the appt. Called pt and reached her VM, left her a message with [facility] phone number and let her know they have the order now and to call them to make the appt

## 2018-02-14 ENCOUNTER — Other Ambulatory Visit: Payer: Self-pay | Admitting: *Deleted

## 2018-02-14 MED ORDER — LORAZEPAM 0.5 MG PO TABS: ORAL_TABLET | ORAL | 0 refills | Status: DC

## 2018-02-14 NOTE — Telephone Encounter (Signed)
Received call from patient stating she took something to help her relax last time she had an MRI.  Called in Lorazepam for her to the pharmacy.  Patient aware of how to take medication before MRI.

## 2018-02-20 ENCOUNTER — Ambulatory Visit (HOSPITAL_COMMUNITY): Payer: Self-pay

## 2018-02-20 ENCOUNTER — Ambulatory Visit
Admission: RE | Admit: 2018-02-20 | Discharge: 2018-02-20 | Disposition: A | Discharge status: Home / Self Care | Payer: Medicare Other | Source: Ambulatory Visit | Attending: Hematology and Oncology | Admitting: Hematology and Oncology

## 2018-02-20 ENCOUNTER — Telehealth: Payer: Self-pay | Admitting: Hematology and Oncology

## 2018-02-20 DIAGNOSIS — C50512 Malignant neoplasm of lower-outer quadrant of left female breast: Secondary | ICD-10-CM

## 2018-02-20 DIAGNOSIS — Z170 Estrogen receptor positive status [ER+]: Principal | ICD-10-CM

## 2018-02-20 DIAGNOSIS — C50912 Malignant neoplasm of unspecified site of left female breast: Secondary | ICD-10-CM | POA: Diagnosis not present

## 2018-02-20 MED ORDER — GADOBENATE DIMEGLUMINE 529 MG/ML IV SOLN
10.0000 mL | Freq: Once | INTRAVENOUS | Status: AC | PRN
  Administered 2018-02-20: 10 mL via INTRAVENOUS

## 2018-02-20 NOTE — Telephone Encounter (Signed)
Tried to call patient regarding voicemail °

## 2018-02-22 ENCOUNTER — Telehealth: Payer: Self-pay | Admitting: Hematology and Oncology

## 2018-02-22 ENCOUNTER — Inpatient Hospital Stay: Payer: Medicare Other | Attending: Hematology and Oncology | Admitting: Hematology and Oncology

## 2018-02-22 VITALS — BP 120/64 | HR 60 | Temp 98.2°F | Resp 15 | Ht 64.0 in | Wt 125.6 lb

## 2018-02-22 DIAGNOSIS — Z79811 Long term (current) use of aromatase inhibitors: Secondary | ICD-10-CM | POA: Diagnosis not present

## 2018-02-22 DIAGNOSIS — R232 Flushing: Secondary | ICD-10-CM | POA: Insufficient documentation

## 2018-02-22 DIAGNOSIS — C50512 Malignant neoplasm of lower-outer quadrant of left female breast: Secondary | ICD-10-CM | POA: Insufficient documentation

## 2018-02-22 DIAGNOSIS — Z79899 Other long term (current) drug therapy: Secondary | ICD-10-CM | POA: Diagnosis not present

## 2018-02-22 DIAGNOSIS — Z170 Estrogen receptor positive status [ER+]: Secondary | ICD-10-CM | POA: Insufficient documentation

## 2018-02-22 NOTE — Progress Notes (Signed)
Patient Care Team: Carollee Herter, Alferd Apa, DO as PCP - General Star Age, MD as Consulting Physician (Neurology) Dian Queen, MD as Consulting Physician (Obstetrics and Gynecology) Lyndal Pulley, DO as Consulting Physician (Family Medicine) Eldridge Abrahams, MD as Consulting Physician (Neurology) Carolan Clines, MD as Consulting Physician (Urology) Jola Schmidt, MD as Consulting Physician (Ophthalmology)  DIAGNOSIS:  Encounter Diagnosis  Name Primary?  . 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  She had taken it for 6 months and underwent an MRI yesterday and is here today to discuss the results.  The MRI showed that the tumor appears to be less hypermetabolic but overall size is unchanged.  She has been tolerating letrozole extremely well.  She denies any hot flashes or myalgias.  REVIEW OF SYSTEMS:   Constitutional: Denies fevers, chills or abnormal weight loss Eyes: Denies blurriness of vision Ears, nose, mouth, throat, and face: Denies mucositis or sore  throat Respiratory: Denies cough, dyspnea or wheezes Cardiovascular: Denies palpitation, chest discomfort Gastrointestinal:  Denies nausea, heartburn or change in bowel habits Skin: Denies abnormal skin rashes Lymphatics: Denies new lymphadenopathy or easy bruising Neurological:Denies numbness, tingling or new weaknesses Behavioral/Psych: Mood is stable, no new changes  Extremities: No lower extremity edema Breast: The tumor in the breast appears to feel smaller All other systems were reviewed with the patient and are negative.  I have reviewed the past medical history, past surgical history, social history and family history with the patient and they are unchanged from previous note.  ALLERGIES:  is allergic to penicillins.  MEDICATIONS:  Current Outpatient Medications  Medication Sig Dispense Refill  . AMBULATORY NON FORMULARY MEDICATION Medication Name: Compression Stockings 20-13mHg  1 pair 1 each 0  . b complex vitamins tablet Take 1 tablet by mouth daily. MEGA FOOD BALANCE SOURCE    . Cholecalciferol (VITAMIN D3) 1000 UNIT/SPRAY LIQD Take 2 sprays by mouth 2 (two) times daily.     . hydrochlorothiazide (HYDRODIURIL) 25 MG tablet TAKE 1 TABLET BY MOUTH EVERY DAY 30 tablet 0  . letrozole (FEMARA) 2.5 MG tablet Take 1 tablet (2.5 mg total) by mouth daily. 90 tablet 3  . LORazepam (ATIVAN) 0.5 MG tablet Take 1 tablet by mouth 1 hour before procedure, then 1 tablet right before. 2 tablet 0  . NON FORMULARY Grow Bone Raw Calcium/K+/ Magnesium/ D--   2 caps twice a day    . pramipexole (MIRAPEX) 0.75 MG tablet 0.75 mg 3 (three) times daily.  4  . Probiotic Product (PROBIOTIC DAILY) CAPS Take by mouth  as needed. Healthy Intestinal Flora    . sertraline (ZOLOFT) 50 MG tablet Take 50 mg by mouth daily.      No current facility-administered medications for this visit.     PHYSICAL EXAMINATION: ECOG PERFORMANCE STATUS: 1 - Symptomatic but completely ambulatory  Vitals:   02/22/18  1135  BP: 120/64  Pulse: 60  Resp: 15  Temp: 98.2 F (36.8 C)  SpO2: 99%   Filed Weights   02/22/18 1135  Weight: 125 lb 9.6 oz (57 kg)    GENERAL:alert, no distress and comfortable SKIN: skin color, texture, turgor are normal, no rashes or significant lesions EYES: normal, Conjunctiva are pink and non-injected, sclera clear OROPHARYNX:no exudate, no erythema and lips, buccal mucosa, and tongue normal  NECK: supple, thyroid normal size, non-tender, without nodularity LYMPH:  no palpable lymphadenopathy in the cervical, axillary or inguinal LUNGS: clear to auscultation and percussion with normal breathing effort HEART: regular rate & rhythm and no murmurs and no lower extremity edema ABDOMEN:abdomen soft, non-tender and normal bowel sounds MUSCULOSKELETAL:no cyanosis of digits and no clubbing  NEURO: alert & oriented x 3 with fluent speech, no focal motor/sensory deficits EXTREMITIES: No lower extremity edema BREAST: No palpable lumps or nodules in the breast (exam performed in the presence of a chaperone)  LABORATORY DATA:  I have reviewed the data as listed CMP Latest Ref Rng & Units 08/18/2017 05/02/2017 05/09/2016  Glucose 70 - 99 mg/dL 98 88 55(L)  BUN 6 - 23 mg/dL 24(H) 17 20  Creatinine 0.40 - 1.20 mg/dL 0.99 0.87 0.92  Sodium 135 - 145 mEq/L 139 140 141  Potassium 3.5 - 5.1 mEq/L 3.8 3.7 3.7  Chloride 96 - 112 mEq/L 101 103 106  CO2 19 - 32 mEq/L 32 32 27  Calcium 8.4 - 10.5 mg/dL 9.5 9.5 9.2  Total Protein 6.0 - 8.3 g/dL - - 6.7  Total Bilirubin 0.2 - 1.2 mg/dL - - 0.3  Alkaline Phos 39 - 117 U/L - - 59  AST 0 - 37 U/L - - 18  ALT 0 - 35 U/L - - 15    Lab Results  Component Value Date   WBC 7.0 05/09/2016   HGB 13.0 05/09/2016   HCT 39.1 05/09/2016   MCV 87.5 05/09/2016   PLT 208.0 05/09/2016   NEUTROABS 5.1 05/09/2016    ASSESSMENT & PLAN:  Malignant neoplasm of lower-outer quadrant of left breast of female, estrogen receptor positive ([place]) 07/21/17:  Left breast lower outer quadrant 5.4 x 2.3 x 3.8 cm mass along with additional satellite nodules measuring 6 mm and several other small satellite nodules, no lymphadenopathy, 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  Neoadjuvant therapy with hormonal therapy based upon low risk Oncotype DX test result (score 10: 7% ROR) 2. on letrozole 2.5 mg daily 3.  6 months of neoadjuvant therapy followed by surgery depending on the degree of response, patient may be eligible for lumpectomy versus mastectomy 4.  Followed by adjuvant radiation 5.  Followed by antiestrogen therapy ---------------------------------------------------------------- Letrozole Toxicities: Intermittent mild hot flashes Denies any arthralgias or myalgias.  Breast MRI 02/21/2018: Interval decrease in the enhancement involving the breast cancer.  The primary breast tumor appears to be stable.  Recommendation:  I discussed with the patient that surgery should be considered next with mastectomy. However the patient is reluctant to undergo mastectomy  at this time.  She would like to continue antiestrogen therapy for 6 more months and then follow-up with the breast MRI.   Return to clinic in 6 months after breast MRI and follow-up    Orders Placed This Encounter  Procedures  . MR BREAST BILATERAL W WO CONTRAST INC CAD    Standing Status:   Future    Standing Expiration Date:   04/25/2019    Order Specific Question:   If indicated for the ordered procedure, I authorize the administration of contrast media per Radiology protocol    Answer:   Yes    Order Specific Question:   What is the patient's sedation requirement?    Answer:   No Sedation    Order Specific Question:   Does the patient have a pacemaker or implanted devices?    Answer:   No    Order Specific Question:   Radiology Contrast Protocol - do  NOT remove file path    Answer:   [email or web address]    Order Specific Question:   Preferred imaging location?    Answer:   [facility] (table limit-350 lbs)   The patient has a good understanding of the overall plan. she agrees with it. she will call with any problems that may develop before the next visit here.   Harriette Ohara, MD 02/22/18

## 2018-02-22 NOTE — Assessment & Plan Note (Addendum)
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  Neoadjuvant therapy with hormonal therapy based upon low risk Oncotype DX test result (score 10: 7% ROR) 2. on letrozole 2.5 mg daily 3.  6 months of neoadjuvant therapy followed by surgery depending on the degree of response, patient may be eligible for lumpectomy versus mastectomy 4.  Followed by adjuvant radiation 5.  Followed by antiestrogen therapy ---------------------------------------------------------------- Letrozole Toxicities: Intermittent mild hot flashes Denies any arthralgias or myalgias.  Breast MRI 02/21/2018: Interval decrease in the enhancement involving the breast cancer.  The primary breast tumor appears to be stable.  Recommendation:  I discussed with the patient that surgery should be considered next with mastectomy. However the patient is reluctant to undergo mastectomy at this time.  She would like to continue antiestrogen therapy for 6 more months and then follow-up with the breast MRI.   Return to clinic in 6 months after breast MRI and follow-up

## 2018-02-22 NOTE — Telephone Encounter (Signed)
Gave avs and calendar ° °

## 2018-02-23 ENCOUNTER — Other Ambulatory Visit (HOSPITAL_COMMUNITY): Payer: Self-pay

## 2018-02-23 DIAGNOSIS — Z170 Estrogen receptor positive status [ER+]: Principal | ICD-10-CM

## 2018-02-23 DIAGNOSIS — C50512 Malignant neoplasm of lower-outer quadrant of left female breast: Secondary | ICD-10-CM

## 2018-02-26 DIAGNOSIS — F419 Anxiety disorder, unspecified: Secondary | ICD-10-CM | POA: Diagnosis not present

## 2018-02-26 DIAGNOSIS — C50919 Malignant neoplasm of unspecified site of unspecified female breast: Secondary | ICD-10-CM | POA: Diagnosis not present

## 2018-02-26 DIAGNOSIS — R4189 Other symptoms and signs involving cognitive functions and awareness: Secondary | ICD-10-CM | POA: Diagnosis not present

## 2018-02-26 DIAGNOSIS — Z170 Estrogen receptor positive status [ER+]: Secondary | ICD-10-CM | POA: Diagnosis not present

## 2018-02-26 DIAGNOSIS — G20 Parkinson's disease: Secondary | ICD-10-CM | POA: Diagnosis not present

## 2018-02-27 ENCOUNTER — Other Ambulatory Visit (HOSPITAL_COMMUNITY): Payer: Self-pay

## 2018-02-27 DIAGNOSIS — Z170 Estrogen receptor positive status [ER+]: Secondary | ICD-10-CM

## 2018-02-27 DIAGNOSIS — C50412 Malignant neoplasm of upper-outer quadrant of left female breast: Secondary | ICD-10-CM

## 2018-02-27 DIAGNOSIS — C50121 Malignant neoplasm of central portion of right male breast: Secondary | ICD-10-CM

## 2018-03-05 ENCOUNTER — Other Ambulatory Visit: Payer: Self-pay

## 2018-03-05 ENCOUNTER — Ambulatory Visit (HOSPITAL_COMMUNITY): Payer: Medicare Other | Attending: Cardiology

## 2018-03-05 DIAGNOSIS — I313 Pericardial effusion (noninflammatory): Secondary | ICD-10-CM | POA: Diagnosis not present

## 2018-03-05 DIAGNOSIS — C50412 Malignant neoplasm of upper-outer quadrant of left female breast: Secondary | ICD-10-CM

## 2018-03-05 DIAGNOSIS — C50111 Malignant neoplasm of central portion of right female breast: Secondary | ICD-10-CM | POA: Diagnosis not present

## 2018-03-05 DIAGNOSIS — Z170 Estrogen receptor positive status [ER+]: Secondary | ICD-10-CM | POA: Insufficient documentation

## 2018-03-05 DIAGNOSIS — C50121 Malignant neoplasm of central portion of right male breast: Secondary | ICD-10-CM

## 2018-03-08 DIAGNOSIS — R41841 Cognitive communication deficit: Secondary | ICD-10-CM | POA: Diagnosis not present

## 2018-03-08 DIAGNOSIS — Z79899 Other long term (current) drug therapy: Secondary | ICD-10-CM | POA: Diagnosis not present

## 2018-03-08 DIAGNOSIS — G20 Parkinson's disease: Secondary | ICD-10-CM | POA: Diagnosis not present

## 2018-03-08 DIAGNOSIS — R2689 Other abnormalities of gait and mobility: Secondary | ICD-10-CM | POA: Diagnosis not present

## 2018-03-08 DIAGNOSIS — Z712 Person consulting for explanation of examination or test findings: Secondary | ICD-10-CM | POA: Diagnosis not present

## 2018-03-08 DIAGNOSIS — R251 Tremor, unspecified: Secondary | ICD-10-CM | POA: Diagnosis not present

## 2018-03-08 DIAGNOSIS — F419 Anxiety disorder, unspecified: Secondary | ICD-10-CM | POA: Diagnosis not present

## 2018-04-26 NOTE — Progress Notes (Signed)
Subjective:   Amanda [name] is a 72 y.o. female who presents for Medicare Annual (Subsequent) preventive examination.  Review of Systems: No ROS.  Medicare Wellness Visit. Additional risk factors are reflected in the social history.   Sleep patterns: Stays up late and wakes up late. Home Safety/Smoke Alarms: Feels safe in home. Smoke alarms in place.  Living environment; residence and Firearm Safety: Lives with husband in 2 story home. Can live on 1st floor. 6 steps to get into house. Walk in showers. Grab rail in place.  Female:   Pap-2016 with Dr.Grewal       Mammo-utd       Dexa scan- pt reports she will do with Dr.Grewall in March.        CCS- pt declines at this time    Objective:     Vitals: BP 110/64 (BP Location: Left Arm, Patient Position: Sitting, Cuff Size: Normal)   Pulse 65   Ht 5\' 4"  (1.626 m)   Wt 121 lb 12.8 oz (55.2 kg)   SpO2 97%   BMI 20.91 kg/m   Body mass index is 20.91 kg/m.  Advanced Directives 05/03/2018 08/15/2017 05/02/2017 03/04/2016 01/11/2016 01/11/2016 07/28/2015  Does Patient Have a Medical Advance Directive? Yes Yes Yes Yes Yes Yes Yes  Type of Paramedic of [place];Living will Living will;Healthcare Power of [place];Living will [place];Living will Living will Living will;Healthcare Power of [place];Living will  Does patient want to make changes to medical advance directive? - - - No - Patient declined No - Patient declined No - Patient declined -  Copy of [place] in Chart? No - copy requested No - copy requested No - copy requested No - copy requested No - copy requested No - copy requested No - copy requested    Tobacco Social History   Tobacco Use  Smoking Status Never Smoker  Smokeless Tobacco Never Used     Counseling given: Not Answered   Clinical Intake: Pain : No/denies pain    Past Medical History:    Diagnosis Date  . Breast cancer, left breast ([place])   . Frequency of urination   . GAD (generalized anxiety disorder)   . Osteopenia   . Parkinson disease [facility])    neurologist-  dr Arvella Nigh--  right side predominant  . Prolapse of vaginal wall   . Urgency of urination   . Wears glasses    Past Surgical History:  Procedure Laterality Date  . BREAST BIOPSY Left 07/2017   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  . COLONOSCOPY  last one 2007  . CYSTOCELE REPAIR N/A 01/11/2016   Procedure: ANTERIOR REPAIR (CYSTOCELE) COLOPLAST FASCIA  SACROSPINOUS FIXATION;  Surgeon: Carolan Clines, MD;  Location: [place];  Service: Urology;  Laterality: N/A;  . CYSTOSCOPY W/ URETERAL STENT PLACEMENT Bilateral 01/11/2016   Procedure: CYSTOSCOPY WITH BILATERAL RETROGRADE PYELOGRAM/URETERAL STENT PLACEMENT;  Surgeon: Carolan Clines, MD;  Location: [place];  Service: Urology;  Laterality: Bilateral;  . ESOPHAGOGASTRODUODENOSCOPY  2007  . EXTERNAL EAR SURGERY Bilateral age 20  . LAPAROSCOPIC OVARIAN CYSTECTOMY  March 2008   unilateral  . PUBOVAGINAL SLING N/A 01/11/2016   Procedure:  ALTIS SLING SINGLE INCISION;  Surgeon: Carolan Clines, MD;  Location: [facility];  Service: Urology;  Laterality: N/A;  . RECTOCELE REPAIR N/A 01/11/2016   Procedure: POSTERIOR REPAIR (RECTOCELE), PERINEAPLASTY;  Surgeon: Sharyn Lull  Helane Rima, MD;  Location[facility];  Service: Gynecology;  Laterality: N/A;  . VAGINAL HYSTERECTOMY N/A 01/11/2016   Procedure: TOTAL VAGINAL HYSTERECTOMY ;  Surgeon: Dian Queen, MD;  Location: [place];  Service: Gynecology;  Laterality: N/A;   Family History  Problem Relation Age of Onset  . Heart disease Mother        enlarged heart  . Cancer Father        pancreatic  . COPD Sister   . Pulmonary embolism Sister   . Edema Sister        Ankles  . Heart disease Brother   . Polycystic  kidney disease Son    Social History   Socioeconomic History  . Marital status: Married    Spouse name: Chrissie Noa  . Number of children: 3  . Years of education: College  . Highest education level: Not on file  Occupational History  . Occupation: retired  Scientific laboratory technician  . Financial resource strain: Not on file  . Food insecurity:    Worry: Not on file    Inability: Not on file  . Transportation needs:    Medical: Not on file    Non-medical: Not on file  Tobacco Use  . Smoking status: Never Smoker  . Smokeless tobacco: Never Used  Substance and Sexual Activity  . Alcohol use: No  . Drug use: No  . Sexual activity: Not on file  Lifestyle  . Physical activity:    Days per week: Not on file    Minutes per session: Not on file  . Stress: Not on file  Relationships  . Social connections:    Talks on phone: Not on file    Gets together: Not on file    Attends religious service: Not on file    Active member of club or organization: Not on file    Attends meetings of clubs or organizations: Not on file    Relationship status: Not on file  Other Topics Concern  . Not on file  Social History Narrative   Pt lives at home with family.   Caffeine Use: some, occasionally    Outpatient Encounter Medications as of 05/03/2018  Medication Sig  . AMBULATORY NON FORMULARY MEDICATION Medication Name: Compression Stockings 20-21mmHg  1 pair  . b complex vitamins tablet Take 1 tablet by mouth daily. MEGA FOOD BALANCE SOURCE  . Cholecalciferol (VITAMIN D3) 1000 UNIT/SPRAY LIQD Take 2 sprays by mouth 2 (two) times daily.   . hydrochlorothiazide (HYDRODIURIL) 25 MG tablet TAKE 1 TABLET BY MOUTH EVERY DAY  . letrozole (FEMARA) 2.5 MG tablet Take 1 tablet (2.5 mg total) by mouth daily.  . NON FORMULARY Grow Bone Raw Calcium/K+/ Magnesium/ D--   2 caps twice a day  . pramipexole (MIRAPEX) 0.75 MG tablet 1 mg 3 (three) times daily.   . Probiotic Product (PROBIOTIC DAILY) CAPS Take by mouth as  needed. Healthy Intestinal Flora  . sertraline (ZOLOFT) 50 MG tablet Take 50 mg by mouth daily.   Marland Kitchen LORazepam (ATIVAN) 0.5 MG tablet Take 1 tablet by mouth 1 hour before procedure, then 1 tablet right before. (Patient not taking: Reported on 05/03/2018)   No facility-administered encounter medications on file as of 05/03/2018.     Activities of Daily Living In your present state of health, do you have any difficulty performing the following activities: 05/03/2018  Hearing? N  Vision? N  Difficulty concentrating or making decisions? N  Walking or climbing stairs? N  Dressing or bathing? N  Doing errands, shopping? N  Preparing Food and eating ? N  Using the Toilet? N  In the past six months, have you accidently leaked urine? N  Do you have problems with loss of bowel control? N  Managing your Medications? N  Managing your Finances? Y  Comment husband does  [employer], broadcasting/film/video? N  Some recent data might be hidden    Patient Care Team: Carollee Herter, Alferd Apa, DO as PCP - General Star Age, MD as Consulting Physician (Neurology) Dian Queen, MD as Consulting Physician (Obstetrics and Gynecology) Lyndal Pulley, DO as Consulting Physician (Family Medicine) Eldridge Abrahams, MD as Consulting Physician (Neurology) Carolan Clines, MD as Consulting Physician (Urology) Jola Schmidt, MD as Consulting Physician (Ophthalmology)    Assessment:   This is a routine wellness examination for [place]. Physical assessment deferred to PCP.  Exercise Activities and Dietary recommendations Current Exercise Habits: The patient does not participate in regular exercise at present, Exercise limited by: neurologic condition(s)(parkinson's) Diet (meal preparation, eat out, water intake, caffeinated beverages, dairy products, fruits and vegetables): Diet varies. Doesn't feel like cooking much anymore.    Goals    . Increase physical activity (pt-stated)     Starting  05/02/2017, I will start doing Parkinson's exercises with my husband and using the stationary bike 2-3 times weekly.       Fall Risk Fall Risk  05/03/2018 08/15/2017 05/02/2017 03/04/2016 01/04/2016  Falls in the past year? Yes No No No No  Number falls in past yr: 2 or more - - - -  Injury with Fall? Yes - - - -  Comment - - - - -  Risk Factor Category  High Fall Risk - - - -  Risk for fall due to : Impaired balance/gait - Impaired balance/gait;Impaired mobility;Other (Comment) - -  Risk for fall due to: Comment parkinsons - Parkinson's Disease - -  Follow up Education provided;Falls prevention discussed - - - -    Depression Screen PHQ 2/9 Scores 05/03/2018 08/15/2017 05/02/2017 03/04/2016  PHQ - 2 Score 0 0 1 0  PHQ- 9 Score - - - -     Cognitive Function MMSE - Mini Mental State Exam 05/03/2018 05/02/2017  Orientation to time 5 4  Orientation to time comments - Disoriented to date, off by 1 day.  Orientation to Place 5 5  Registration 3 3  Attention/ Calculation 5 5  Recall 2 3  Language- name 2 objects 2 2  Language- repeat 1 1  Language- follow 3 step command 3 3  Language- read & follow direction 1 1  Write a sentence 1 1  Copy design - 1  Total score - 29        Immunization History  Administered Date(s) Administered  . Influenza Split 08/17/2011  . Influenza Whole 10/21/2009  . Influenza, High Dose Seasonal PF 05/18/2017  . Influenza,inj,Quad PF,6+ Mos 07/12/2013  . Pneumococcal Conjugate-13 03/05/2015  . Pneumococcal Polysaccharide-23 03/04/2016  . Td 04/22/2004, 10/21/2009  . Tdap 05/09/2016  . Zoster 06/26/2008   Screening Tests Health Maintenance  Topic Date Due  . Hepatitis C Screening  1946-08-16  . Fecal DNA (Cologuard)  05/22/1997  . INFLUENZA VACCINE  04/19/2018  . MAMMOGRAM  07/21/2018  . TETANUS/TDAP  05/09/2026  . DEXA SCAN  Completed  . PNA vac Low Risk Adult  Completed      Plan:    Please schedule your next medicare wellness visit with  me in 1 yr.  Bring a copy of your living will and/or healthcare power of attorney to your next office visit.  Please check into doing yoga or balance as discussed.  I have personally reviewed and noted the following in the patient's chart:   . Medical and social history . Use of alcohol, tobacco or illicit drugs  . Current medications and supplements . Functional ability and status . Nutritional status . Physical activity . Advanced directives . List of other physicians . Hospitalizations, surgeries, and ER visits in previous 12 months . Vitals . Screenings to include cognitive, depression, and falls . Referrals and appointments  In addition, I have reviewed and discussed with patient certain preventive protocols, quality metrics, and best practice recommendations. A written personalized care plan for preventive services as well as general preventive health recommendations were provided to patient.     Shela Nevin, [place]  05/03/2018

## 2018-04-30 ENCOUNTER — Telehealth: Payer: Self-pay | Admitting: Family Medicine

## 2018-04-30 NOTE — Telephone Encounter (Signed)
Copied from [place] [id number]. Topic: Quick Communication - See Telephone Encounter >> Apr 30, 2018  5:47 PM Blase Mess A wrote: CRM for notification. See Telephone encounter for: 04/30/18. Patient called in and said that she is not taking her hydrochlorothiazide (HYDRODIURIL) 25 MG tablet correctly.  She is getting swelling in her ankles at night.  She is taking the hydrochlorothiazide (HYDRODIURIL) 25 MG tablet every other day when she is not wearing the compression hose.  At this time, she is requesting an appointment to be seen.

## 2018-05-01 NOTE — Telephone Encounter (Signed)
Patient advised to take medications as directed.  She will take in the mornings.  She also complains of a rash on her arms.  She will try some otc berts bees to see if that will help.  Appointment made for 05/10/18 for swelling and rash

## 2018-05-03 ENCOUNTER — Ambulatory Visit (INDEPENDENT_AMBULATORY_CARE_PROVIDER_SITE_OTHER): Payer: Medicare Other | Admitting: *Deleted

## 2018-05-03 ENCOUNTER — Encounter: Payer: Self-pay | Admitting: *Deleted

## 2018-05-03 VITALS — BP 110/64 | HR 65 | Ht 64.0 in | Wt 121.8 lb

## 2018-05-03 DIAGNOSIS — Z0000 Encounter for general adult medical examination without abnormal findings: Secondary | ICD-10-CM

## 2018-05-03 NOTE — Patient Instructions (Signed)
Please schedule your next medicare wellness visit with me in 1 yr.  Bring a copy of your living will and/or healthcare power of attorney to your next office visit.  Please check into doing yoga or balance as discussed.   Amanda [name] , Thank you for taking time to come for your Medicare Wellness Visit. I appreciate your ongoing commitment to your health goals. Please review the following plan we discussed and let me know if I can assist you in the future.   These are the goals we discussed: Goals    . Increase physical activity (pt-stated)     Starting 05/02/2017, I will start doing Parkinson's exercises with my husband and using the stationary bike 2-3 times weekly.       This is a list of the screening recommended for you and due dates:  Health Maintenance  Topic Date Due  .  Hepatitis C: One time screening is recommended by Center for Disease Control  (CDC) for  adults born from [birth year] through 1964.   [birth year]  . Cologuard (Stool DNA test)  05/22/1997  . Flu Shot  04/19/2018  . Mammogram  07/21/2018  . Tetanus Vaccine  05/09/2026  . DEXA scan (bone density measurement)  Completed  . Pneumonia vaccines  Completed   Manufacturing engineer of Services Cost  A Matter of Balance Class locations vary. Call [place] on Aging for more information.  http://dawson-may.[email or web address]/ [phone] 8-Session program addressing the fear of falling and increasing activity levels of older adults Free to minimal cost  A.C.T. By [employer] [address], [place], [place] [address].  BetaBlues.dk [phone]  Personal training, gym, classes including Silver Sneakers* and ACTion for Aging Adults Fee-based  A.H.O.Y. (Add Health to [place]) Airs on Time [employer] 13, M-F at [place]: [employer],  [place] [facility] [place],  [place], [place] [facility], [address] [facility], [place], [place], [place] [address]  [place] Location: Sharrell Ku. [employer] [place]      [phone]  [phone]  [phone]  [phone]  [phone]  [phone]  [phone]  [phone]  [phone]  [phone]    [phone] A total-body conditioning class for adults 76 and older; designed to increase muscular strength, endurance, range of movement, flexibility, balance, agility and coordination Free  [facility] [place], [place] [address]      [address]      [phone]      Pilate's class for individualsreturning to exercise after an injury, before or after surgery or for individuals with complex musculoskeletal issues; designed to improve strength, balance , flexibility      $15/class  [place] [address] [place], [place] [address] [email or web address] [place] classes for beginners to advanced [place],  [address] [email or web address] [place] Chair Exercises Free, ages 23 and older; Ages 22-59 fee based  Marvia Pickles, [facility] [address] [place], [place] [address] [email or web address] .Beverlee Nims [phone]  A.H.O.Y. Tai Chi Fee-based Donation based or free  [place] Class locations vary.  Call or email Angela Burke or view website for more  information. [email or web address] [email or web address].[email or web address] [phone] Ongoing classes at local [employer]MC[employer] and gyms Fee-based  Silver Sneakers A.C.T. By [employer]: [place] [phone]  [phone] Classes designed for older adults who want to improve their strength, flexibility, balance and endurance.   Silver sneakers is covered by some insurance plans and includes a fitness center membership at participating locations. Find out more by calling [phone] or visiting [email or web address] Covered by some insurance plans  [facility] [place] [phone] A.H.O.Y., fitness room, personal training, fitness classes for injury prevention, strength, balance, flexibility, water fitness classes Ages 55+: $34 for 6 months; Ages 34-54: $45 for 6 months  Tai Chi for Everybody [facility] [address] [place], [place] [address] [email or web address] .Patsi Sears [phone] Tai Chi classes for beginners to advanced; geared for seniors Donation Based      [employer] ([place] Others Participate in Exercise     Loyal Gambler. Rosana Hoes, PhD, [place] [email or web address] .[email or web address] [place]     [phone]     A comprehensive fitness program for adults.  The program paris senior-level undergraduates Kinesiology students with adults who desire to learn how to exercise safely.  Includes a structural exercise class focusing on functional fitnesss     $100/semester in fall and spring; $75 in summer (no trainers)    *Silver Sneakers is covered by some Personal assistant and includes a  Museum/gallery curator at participating locations.  Find out more by calling [phone] or visiting [email or web address]  For additional health and human services resources for senior adults, please contact SeniorLine at [phone] in [place] and [place] at [phone] in all other areas.It is important to avoid accidents which may result in broken bones.  Here are a few ideas on how to make your home safer so you will be less likely to trip or fall.  1. Use nonskid mats or non slip strips in your shower or tub, on your bathroom floor and around sinks.  If you know that you have spilled water, wipe it up! 2. In the bathroom, it is important to have properly installed grab bars on the walls or on the edge of the tub.  Towel racks are NOT strong enough for you to hold onto or to pull on for support. 3. Stairs and hallways should have enough light.  Add lamps or night lights if you need ore light. 4. It is good to have handrails on both sides of the stairs if possible.  Always fix broken handrails right away. 5. It is important to see the edges of steps.  Paint the edges of outdoor steps white so you can see them better.  Put colored tape on the edge of inside steps. 6. Throw-rugs are dangerous because they can slide.  Removing the rugs is the best idea, but if they must stay, add adhesive carpet tape to prevent slipping. 7. Do not keep things on stairs or in the halls.  Remove small furniture that blocks the halls as it may cause you to trip.  Keep  telephone and electrical cords out of the way where you walk. 8. Always were sturdy, rubber-soled shoes for good support.  Never wear just socks, especially on the stairs.  Socks may cause you to slip or fall.  Do not wear full-length housecoats as you can easily trip on the bottom.  9. Place the things you use the most on the shelves that are the easiest to reach.  If you use a stepstool, make sure it is in good condition.  If you feel unsteady, DO NOT climb, ask for  help. 10. If a health professional advises you to use a cane or walker, do not be ashamed.  These items can keep you from falling and breaking your bones. Health Maintenance for Postmenopausal Women Menopause is a normal process in which your reproductive ability comes to an end. This process happens gradually over a span of months to years, usually between the ages of 35 and 49. Menopause is complete when you have missed 12 consecutive menstrual periods. It is important to talk with your health care provider about some of the most common conditions that affect postmenopausal women, such as heart disease, cancer, and bone loss (osteoporosis). Adopting a healthy lifestyle and getting preventive care can help to promote your health and wellness. Those actions can also lower your chances of developing some of these common conditions. What should I know about menopause? During menopause, you may experience a number of symptoms, such as:  Moderate-to-severe hot flashes.  Night sweats.  Decrease in sex drive.  Mood swings.  Headaches.  Tiredness.  Irritability.  Memory problems.  Insomnia.  Choosing to treat or not to treat menopausal changes is an individual decision that you make with your health care provider. What should I know about hormone replacement therapy and supplements? Hormone therapy products are effective for treating symptoms that are associated with menopause, such as hot flashes and night sweats. Hormone replacement carries certain risks, especially as you become older. If you are thinking about using estrogen or estrogen with progestin treatments, discuss the benefits and risks with your health care provider. What should I know about heart disease and stroke? Heart disease, heart attack, and stroke become more likely as you age. This may be due, in part, to the hormonal changes that your body experiences during menopause. These can affect how your body processes dietary  fats, triglycerides, and cholesterol. Heart attack and stroke are both medical emergencies. There are many things that you can do to help prevent heart disease and stroke:  Have your blood pressure checked at least every 1-2 years. High blood pressure causes heart disease and increases the risk of stroke.  If you are 64-107 years old, ask your health care provider if you should take aspirin to prevent a heart attack or a stroke.  Do not use any tobacco products, including cigarettes, chewing tobacco, or electronic cigarettes. If you need help quitting, ask your health care provider.  It is important to eat a healthy diet and maintain a healthy weight. ? Be sure to include plenty of vegetables, fruits, low-fat dairy products, and lean protein. ? Avoid eating foods that are high in solid fats, added sugars, or salt (sodium).  Get regular exercise. This is one of the most important things that you can do for your health. ? Try to exercise for at least 150 minutes each week. The type of exercise that you do should increase your heart rate and make you sweat. This is known as moderate-intensity  exercise. ? Try to do strengthening exercises at least twice each week. Do these in addition to the moderate-intensity exercise.  Know your numbers.Ask your health care provider to check your cholesterol and your blood glucose. Continue to have your blood tested as directed by your health care provider.  What should I know about cancer screening? There are several types of cancer. Take the following steps to reduce your risk and to catch any cancer development as early as possible. Breast Cancer  Practice breast self-awareness. ? This means understanding how your breasts normally appear and feel. ? It also means doing regular breast self-exams. Let your health care provider know about any changes, no matter how small.  If you are 7 or older, have a clinician do a breast exam (clinical breast exam or  CBE) every year. Depending on your age, family history, and medical history, it may be recommended that you also have a yearly breast X-ray (mammogram).  If you have a family history of breast cancer, talk with your health care provider about genetic screening.  If you are at high risk for breast cancer, talk with your health care provider about having an MRI and a mammogram every year.  Breast cancer (BRCA) gene test is recommended for women who have family members with BRCA-related cancers. Results of the assessment will determine the need for genetic counseling and BRCA1 and for BRCA2 testing. BRCA-related cancers include these types: ? Breast. This occurs in males or females. ? Ovarian. ? Tubal. This may also be called fallopian tube cancer. ? Cancer of the abdominal or pelvic lining (peritoneal cancer). ? Prostate. ? Pancreatic.  Cervical, Uterine, and Ovarian Cancer Your health care provider may recommend that you be screened regularly for cancer of the pelvic organs. These include your ovaries, uterus, and vagina. This screening involves a pelvic exam, which includes checking for microscopic changes to the surface of your cervix (Pap test).  For women ages 21-65, health care providers may recommend a pelvic exam and a Pap test every three years. For women ages 66-65, they may recommend the Pap test and pelvic exam, combined with testing for human papilloma virus (HPV), every five years. Some types of HPV increase your risk of cervical cancer. Testing for HPV may also be done on women of any age who have unclear Pap test results.  Other health care providers may not recommend any screening for nonpregnant women who are considered low risk for pelvic cancer and have no symptoms. Ask your health care provider if a screening pelvic exam is right for you.  If you have had past treatment for cervical cancer or a condition that could lead to cancer, you need Pap tests and screening for cancer  for at least 20 years after your treatment. If Pap tests have been discontinued for you, your risk factors (such as having a new sexual partner) need to be reassessed to determine if you should start having screenings again. Some women have medical problems that increase the chance of getting cervical cancer. In these cases, your health care provider may recommend that you have screening and Pap tests more often.  If you have a family history of uterine cancer or ovarian cancer, talk with your health care provider about genetic screening.  If you have vaginal bleeding after reaching menopause, tell your health care provider.  There are currently no reliable tests available to screen for ovarian cancer.  Lung Cancer Lung cancer screening is recommended for adults 19-91 years old  who are at high risk for lung cancer because of a history of smoking. A yearly low-dose CT scan of the lungs is recommended if you:  Currently smoke.  Have a history of at least 30 pack-years of smoking and you currently smoke or have quit within the past 15 years. A pack-year is smoking an average of one pack of cigarettes per day for one year.  Yearly screening should:  Continue until it has been 15 years since you quit.  Stop if you develop a health problem that would prevent you from having lung cancer treatment.  Colorectal Cancer  This type of cancer can be detected and can often be prevented.  Routine colorectal cancer screening usually begins at age 78 and continues through age 72.  If you have risk factors for colon cancer, your health care provider may recommend that you be screened at an earlier age.  If you have a family history of colorectal cancer, talk with your health care provider about genetic screening.  Your health care provider may also recommend using home test kits to check for hidden blood in your stool.  A small camera at the end of a tube can be used to examine your colon directly  (sigmoidoscopy or colonoscopy). This is done to check for the earliest forms of colorectal cancer.  Direct examination of the colon should be repeated every 5-10 years until age 29. However, if early forms of precancerous polyps or small growths are found or if you have a family history or genetic risk for colorectal cancer, you may need to be screened more often.  Skin Cancer  Check your skin from head to toe regularly.  Monitor any moles. Be sure to tell your health care provider: ? About any new moles or changes in moles, especially if there is a change in a mole's shape or color. ? If you have a mole that is larger than the size of a pencil eraser.  If any of your family members has a history of skin cancer, especially at a young age, talk with your health care provider about genetic screening.  Always use sunscreen. Apply sunscreen liberally and repeatedly throughout the day.  Whenever you are outside, protect yourself by wearing long sleeves, pants, a wide-brimmed hat, and sunglasses.  What should I know about osteoporosis? Osteoporosis is a condition in which bone destruction happens more quickly than new bone creation. After menopause, you may be at an increased risk for osteoporosis. To help prevent osteoporosis or the bone fractures that can happen because of osteoporosis, the following is recommended:  If you are 74-68 years old, get at least 1,000 mg of calcium and at least 600 mg of vitamin D per day.  If you are older than age 66 but younger than age 87, get at least 1,200 mg of calcium and at least 600 mg of vitamin D per day.  If you are older than age 23, get at least 1,200 mg of calcium and at least 800 mg of vitamin D per day.  Smoking and excessive alcohol intake increase the risk of osteoporosis. Eat foods that are rich in calcium and vitamin D, and do weight-bearing exercises several times each week as directed by your health care provider. What should I know about  how menopause affects my mental health? Depression may occur at any age, but it is more common as you become older. Common symptoms of depression include:  Low or sad mood.  Changes in sleep patterns.  Changes in appetite or eating patterns.  Feeling an overall lack of motivation or enjoyment of activities that you previously enjoyed.  Frequent crying spells.  Talk with your health care provider if you think that you are experiencing depression. What should I know about immunizations? It is important that you get and maintain your immunizations. These include:  Tetanus, diphtheria, and pertussis (Tdap) booster vaccine.  Influenza every year before the flu season begins.  Pneumonia vaccine.  Shingles vaccine.  Your health care provider may also recommend other immunizations. This information is not intended to replace advice given to you by your health care provider. Make sure you discuss any questions you have with your health care provider. Document Released: 10/28/2005 Document Revised: 03/25/2016 Document Reviewed: 06/09/2015 Elsevier Interactive Patient Education  2018 [employer].

## 2018-05-03 NOTE — Progress Notes (Signed)
Reviewed  Yvonne R Lowne Chase, DO  

## 2018-05-04 ENCOUNTER — Telehealth: Payer: Self-pay | Admitting: *Deleted

## 2018-05-04 NOTE — Telephone Encounter (Signed)
Received Cologuard Order Cancellation: [id number]; order has been Cancelled because it has been Inactive, and has exceeded the 365 days from the Initial order; forwarded to provider/SLS 08/16

## 2018-05-10 ENCOUNTER — Encounter: Payer: Self-pay | Admitting: Family Medicine

## 2018-05-10 ENCOUNTER — Ambulatory Visit (INDEPENDENT_AMBULATORY_CARE_PROVIDER_SITE_OTHER): Payer: Medicare Other | Admitting: Family Medicine

## 2018-05-10 VITALS — BP 111/46 | HR 70 | Temp 98.2°F | Resp 16 | Ht 64.0 in | Wt 123.2 lb

## 2018-05-10 DIAGNOSIS — L2082 Flexural eczema: Secondary | ICD-10-CM

## 2018-05-10 DIAGNOSIS — R600 Localized edema: Secondary | ICD-10-CM | POA: Diagnosis not present

## 2018-05-10 MED ORDER — TRIAMCINOLONE ACETONIDE 0.1 % EX CREA: 1.0000 "application " | TOPICAL_CREAM | Freq: Two times a day (BID) | CUTANEOUS | 0 refills | Status: DC

## 2018-05-10 NOTE — Progress Notes (Signed)
Patient ID: Amanda [name], female   DOB: Oct 18, 1945, 72 y.o.   MRN: [id number]    Subjective:  I acted as a Education administrator for Dr. Carollee Herter.  Guerry Bruin, [place]   Patient ID: Amanda [name], female    DOB: October 02, 1945, 72 y.o.   MRN: [id number]  Chief Complaint  Patient presents with  . Rash    both arms, itchy  . Edema    both ankles    HPI  Patient is in today for rash on both arms and swelling both ankles.  Etiology of rash is unknown.    Patient Care Team: Carollee Herter, Alferd Apa, DO as PCP - General Star Age, MD as Consulting Physician (Neurology) Dian Queen, MD as Consulting Physician (Obstetrics and Gynecology) Lyndal Pulley, DO as Consulting Physician (Family Medicine) Eldridge Abrahams, MD as Consulting Physician (Neurology) Carolan Clines, MD as Consulting Physician (Urology) Jola Schmidt, MD as Consulting Physician (Ophthalmology)   Past Medical History:  Diagnosis Date  . Breast cancer, left breast ([place])   . Frequency of urination   . GAD (generalized anxiety disorder)   . Osteopenia   . Parkinson disease [facility])    neurologist-  dr Arvella Nigh--  right side predominant  . Prolapse of vaginal wall   . Urgency of urination   . Wears glasses     Past Surgical History:  Procedure Laterality Date  . BREAST BIOPSY Left 07/2017   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  . COLONOSCOPY  last one 2007  . CYSTOCELE REPAIR N/A 01/11/2016   Procedure: ANTERIOR REPAIR (CYSTOCELE) COLOPLAST FASCIA  SACROSPINOUS FIXATION;  Surgeon: Carolan Clines, MD;  Location: [place];  Service: Urology;  Laterality: N/A;  . CYSTOSCOPY W/ URETERAL STENT PLACEMENT Bilateral 01/11/2016   Procedure: CYSTOSCOPY WITH BILATERAL RETROGRADE PYELOGRAM/URETERAL STENT PLACEMENT;  Surgeon: Carolan Clines, MD;  Location: [place];  Service: Urology;  Laterality: Bilateral;  . ESOPHAGOGASTRODUODENOSCOPY  2007  . EXTERNAL EAR SURGERY Bilateral age  59  . LAPAROSCOPIC OVARIAN CYSTECTOMY  March 2008   unilateral  . PUBOVAGINAL SLING N/A 01/11/2016   Procedure:  ALTIS SLING SINGLE INCISION;  Surgeon: Carolan Clines, MD;  Location: [facility];  Service: Urology;  Laterality: N/A;  . RECTOCELE REPAIR N/A 01/11/2016   Procedure: POSTERIOR REPAIR (RECTOCELE), PERINEAPLASTY;  Surgeon: Dian Queen, MD;  Location: [place];  Service: Gynecology;  Laterality: N/A;  . VAGINAL HYSTERECTOMY N/A 01/11/2016   Procedure: TOTAL VAGINAL HYSTERECTOMY ;  Surgeon: Dian Queen, MD;  Location: [place];  Service: Gynecology;  Laterality: N/A;    Family History  Problem Relation Age of Onset  . Heart disease Mother        enlarged heart  . Cancer Father        pancreatic  . COPD Sister   . Pulmonary embolism Sister   . Edema Sister        Ankles  . Heart disease Brother   . Polycystic kidney disease Son     Social History   Socioeconomic History  . Marital status: Married    Spouse name: Chrissie Noa  . Number of children: 3  . Years of education: College  . Highest education level: Not on file  Occupational History  . Occupation: retired  Scientific laboratory technician  . Financial resource strain: Not on file  . Food insecurity:    Worry: Not on file    Inability: Not on file  . Transportation needs:  Medical: Not on file    Non-medical: Not on file  Tobacco Use  . Smoking status: Never Smoker  . Smokeless tobacco: Never Used  Substance and Sexual Activity  . Alcohol use: No  . Drug use: No  . Sexual activity: Not on file  Lifestyle  . Physical activity:    Days per week: Not on file    Minutes per session: Not on file  . Stress: Not on file  Relationships  . Social connections:    Talks on phone: Not on file    Gets together: Not on file    Attends religious service: Not on file    Active member of club or organization: Not on file    Attends meetings of clubs or organizations: Not  on file    Relationship status: Not on file  . Intimate partner violence:    Fear of current or ex partner: Not on file    Emotionally abused: Not on file    Physically abused: Not on file    Forced sexual activity: Not on file  Other Topics Concern  . Not on file  Social History Narrative   Pt lives at home with family.   Caffeine Use: some, occasionally    Outpatient Medications Prior to Visit  Medication Sig Dispense Refill  . AMBULATORY NON FORMULARY MEDICATION Medication Name: Compression Stockings 20-10mmHg  1 pair 1 each 0  . b complex vitamins tablet Take 1 tablet by mouth daily. MEGA FOOD BALANCE SOURCE    . Cholecalciferol (VITAMIN D3) 1000 UNIT/SPRAY LIQD Take 2 sprays by mouth 2 (two) times daily.     . hydrochlorothiazide (HYDRODIURIL) 25 MG tablet TAKE 1 TABLET BY MOUTH EVERY DAY 30 tablet 0  . letrozole (FEMARA) 2.5 MG tablet Take 1 tablet (2.5 mg total) by mouth daily. 90 tablet 3  . LORazepam (ATIVAN) 0.5 MG tablet Take 1 tablet by mouth 1 hour before procedure, then 1 tablet right before. 2 tablet 0  . NON FORMULARY Grow Bone Raw Calcium/K+/ Magnesium/ D--   2 caps twice a day    . pramipexole (MIRAPEX) 1 MG tablet Take 1 tablet by mouth 3 (three) times daily.  3  . Probiotic Product (PROBIOTIC DAILY) CAPS Take by mouth as needed. Healthy Intestinal Flora    . sertraline (ZOLOFT) 50 MG tablet Take 50 mg by mouth daily.     . pramipexole (MIRAPEX) 0.75 MG tablet 1 mg 3 (three) times daily.   4   No facility-administered medications prior to visit.     Allergies  Allergen Reactions  . Penicillins Anaphylaxis    Has patient had a PCN reaction causing immediate rash, facial/tongue/throat swelling, SOB or lightheadedness with hypotension: No, just leg cramps Has patient had a PCN reaction causing severe rash involving mucus membranes or skin necrosis: No Has patient had a PCN reaction that required hospitalization Unknown Has patient had a PCN reaction occurring  within the last 10 years:  No If all of the above answers are "NO", then may proceed with Cephalosporin use.     Review of Systems  Constitutional: Negative for chills, fever and malaise/fatigue.  HENT: Negative for congestion and hearing loss.   Eyes: Negative for discharge.  Respiratory: Negative for cough, sputum production and shortness of breath.   Cardiovascular: Positive for leg swelling. Negative for chest pain and palpitations.  Gastrointestinal: Negative for abdominal pain, blood in stool, constipation, diarrhea, heartburn, nausea and vomiting.  Genitourinary: Negative for dysuria, frequency, hematuria and  urgency.  Musculoskeletal: Negative for back pain, falls and myalgias.  Skin: Positive for rash.  Neurological: Negative for dizziness, sensory change, loss of consciousness, weakness and headaches.  Endo/Heme/Allergies: Negative for environmental allergies. Does not bruise/bleed easily.  Psychiatric/Behavioral: Negative for depression and suicidal ideas. The patient is not nervous/anxious and does not have insomnia.        Objective:    Physical Exam  Constitutional: She is oriented to person, place, and time. She appears well-developed and well-nourished.  HENT:  Head: Normocephalic and atraumatic.  Eyes: Conjunctivae and EOM are normal.  Neck: Normal range of motion. Neck supple. No JVD present. Carotid bruit is not present. No thyromegaly present.  Cardiovascular: Normal rate, regular rhythm and normal heart sounds.  No murmur heard. Pulmonary/Chest: Effort normal and breath sounds normal. No respiratory distress. She has no wheezes. She has no rales. She exhibits no tenderness.  Musculoskeletal: She exhibits no edema.  Neurological: She is alert and oriented to person, place, and time.  Skin: Rash noted. Rash is macular and papular. There is erythema.     Psychiatric: She has a normal mood and affect.  Nursing note and vitals reviewed.   BP (!) 111/46 (BP  Location: Left Arm, Cuff Size: Normal)   Pulse 70   Temp 98.2 F (36.8 C) (Oral)   Resp 16   Ht 5\' 4"  (1.626 m)   Wt 123 lb 3.2 oz (55.9 kg)   SpO2 98%   BMI 21.15 kg/m  Wt Readings from Last 3 Encounters:  05/10/18 123 lb 3.2 oz (55.9 kg)  05/03/18 121 lb 12.8 oz (55.2 kg)  02/22/18 125 lb 9.6 oz (57 kg)   BP Readings from Last 3 Encounters:  05/10/18 (!) 111/46  05/03/18 110/64  02/22/18 120/64     Immunization History  Administered Date(s) Administered  . Influenza Split 08/17/2011  . Influenza Whole 10/21/2009  . Influenza, High Dose Seasonal PF 05/18/2017  . Influenza,inj,Quad PF,6+ Mos 07/12/2013  . Pneumococcal Conjugate-13 03/05/2015  . Pneumococcal Polysaccharide-23 03/04/2016  . Td 04/22/2004, 10/21/2009  . Tdap 05/09/2016  . Zoster 06/26/2008    Health Maintenance  Topic Date Due  . Hepatitis C Screening  1946/03/05  . Fecal DNA (Cologuard)  05/22/1997  . INFLUENZA VACCINE  04/19/2018  . MAMMOGRAM  07/21/2018  . TETANUS/TDAP  05/09/2026  . DEXA SCAN  Completed  . PNA vac Low Risk Adult  Completed    Lab Results  Component Value Date   WBC 7.0 05/09/2016   HGB 13.0 05/09/2016   HCT 39.1 05/09/2016   PLT 208.0 05/09/2016   GLUCOSE 98 08/18/2017   CHOL 156 05/09/2016   TRIG 94.0 05/09/2016   HDL 64.80 05/09/2016   LDLCALC 73 05/09/2016   ALT 15 05/09/2016   AST 18 05/09/2016   NA 139 08/18/2017   K 3.8 08/18/2017   CL 101 08/18/2017   CREATININE 0.99 08/18/2017   BUN 24 (H) 08/18/2017   CO2 32 08/18/2017   TSH 1.00 08/17/2011    Lab Results  Component Value Date   TSH 1.00 08/17/2011   Lab Results  Component Value Date   WBC 7.0 05/09/2016   HGB 13.0 05/09/2016   HCT 39.1 05/09/2016   MCV 87.5 05/09/2016   PLT 208.0 05/09/2016   Lab Results  Component Value Date   NA 139 08/18/2017   K 3.8 08/18/2017   CO2 32 08/18/2017   GLUCOSE 98 08/18/2017   BUN 24 (H) 08/18/2017   CREATININE 0.99  08/18/2017   BILITOT 0.3 05/09/2016    ALKPHOS 59 05/09/2016   AST 18 05/09/2016   ALT 15 05/09/2016   PROT 6.7 05/09/2016   ALBUMIN 4.1 05/09/2016   CALCIUM 9.5 08/18/2017   ANIONGAP 6 01/13/2016   GFR 58.90 (L) 08/18/2017   Lab Results  Component Value Date   CHOL 156 05/09/2016   Lab Results  Component Value Date   HDL 64.80 05/09/2016   Lab Results  Component Value Date   LDLCALC 73 05/09/2016   Lab Results  Component Value Date   TRIG 94.0 05/09/2016   Lab Results  Component Value Date   CHOLHDL 2 05/09/2016   No results found for: HGBA1C       Assessment & Plan:   Problem List Items Addressed This Visit      Unprioritized   Edema, lower extremity    Elevate legs Compression stocking con't hctz  Check labs       Flexural eczema - Primary    Triamcinolone and eucerin  2 weeks rto or call if no improvement      Relevant Medications   triamcinolone cream (KENALOG) 0.1 %    Other Visit Diagnoses    Lower extremity edema          I am having Amanda [name] start on triamcinolone cream. I am also having her maintain her b complex vitamins, Vitamin D3, NON FORMULARY, PROBIOTIC DAILY, sertraline, letrozole, hydrochlorothiazide, AMBULATORY NON FORMULARY MEDICATION, LORazepam, and pramipexole.  Meds ordered this encounter  Medications  . triamcinolone cream (KENALOG) 0.1 %    Sig: Apply 1 application topically 2 (two) times daily.    Dispense:  30 g    Refill:  0    CMA served as scribe during this visit. History, Physical and Plan performed by medical provider. Documentation and orders reviewed and attested to.  Ann Held, DO

## 2018-05-10 NOTE — Patient Instructions (Addendum)
Mix eucerin cream with the triamcinolone  2/3:1/3  You can con't to use the eucerin daily and the triamciolone mixuture for no  More than 2 weeks       Edema Edema is an abnormal buildup of fluids in your bodytissues. Edema is somewhatdependent on gravity to pull the fluid to the lowest place in your body. That makes the condition more common in the legs and thighs (lower extremities). Painless swelling of the feet and ankles is common and becomes more likely as you get older. It is also common in looser tissues, like around your eyes. When the affected area is squeezed, the fluid may move out of that spot and leave a dent for a few moments. This dent is called pitting. What are the causes? There are many possible causes of edema. Eating too much salt and being on your feet or sitting for a long time can cause edema in your legs and ankles. Hot weather may make edema worse. Common medical causes of edema include:  Heart failure.  Liver disease.  Kidney disease.  Weak blood vessels in your legs.  Cancer.  An injury.  Pregnancy.  Some medications.  Obesity.  What are the signs or symptoms? Edema is usually painless.Your skin may look swollen or shiny. How is this diagnosed? Your health care provider may be able to diagnose edema by asking about your medical history and doing a physical exam. You may need to have tests such as X-rays, an electrocardiogram, or blood tests to check for medical conditions that may cause edema. How is this treated? Edema treatment depends on the cause. If you have heart, liver, or kidney disease, you need the treatment appropriate for these conditions. General treatment may include:  Elevation of the affected body part above the level of your heart.  Compression of the affected body part. Pressure from elastic bandages or support stockings squeezes the tissues and forces fluid back into the blood vessels. This keeps fluid from entering the  tissues.  Restriction of fluid and salt intake.  Use of a water pill (diuretic). These medications are appropriate only for some types of edema. They pull fluid out of your body and make you urinate more often. This gets rid of fluid and reduces swelling, but diuretics can have side effects. Only use diuretics as directed by your health care provider.  Follow these instructions at home:  Keep the affected body part above the level of your heart when you are lying down.  Do not sit still or stand for prolonged periods.  Do not put anything directly under your knees when lying down.  Do not wear constricting clothing or garters on your upper legs.  Exercise your legs to work the fluid back into your blood vessels. This may help the swelling go down.  Wear elastic bandages or support stockings to reduce ankle swelling as directed by your health care provider.  Eat a low-salt diet to reduce fluid if your health care provider recommends it.  Only take medicines as directed by your health care provider. Contact a health care provider if:  Your edema is not responding to treatment.  You have heart, liver, or kidney disease and notice symptoms of edema.  You have edema in your legs that does not improve after elevating them.  You have sudden and unexplained weight gain. Get help right away if:  You develop shortness of breath or chest pain.  You cannot breathe when you lie down.  You develop  pain, redness, or warmth in the swollen areas.  You have heart, liver, or kidney disease and suddenly get edema.  You have a fever and your symptoms suddenly get worse. This information is not intended to replace advice given to you by your health care provider. Make sure you discuss any questions you have with your health care provider. Document Released: 09/05/2005 Document Revised: 02/11/2016 Document Reviewed: 06/28/2013 Elsevier Interactive Patient Education  2017 [employer].

## 2018-05-13 DIAGNOSIS — L2082 Flexural eczema: Secondary | ICD-10-CM | POA: Insufficient documentation

## 2018-05-13 NOTE — Assessment & Plan Note (Signed)
Triamcinolone and eucerin  2 weeks rto or call if no improvement

## 2018-05-13 NOTE — Assessment & Plan Note (Signed)
Elevate legs Compression stocking con't hctz  Check labs

## 2018-06-12 DIAGNOSIS — B351 Tinea unguium: Secondary | ICD-10-CM | POA: Diagnosis not present

## 2018-06-12 DIAGNOSIS — L218 Other seborrheic dermatitis: Secondary | ICD-10-CM | POA: Diagnosis not present

## 2018-06-12 DIAGNOSIS — L570 Actinic keratosis: Secondary | ICD-10-CM | POA: Diagnosis not present

## 2018-06-12 DIAGNOSIS — B353 Tinea pedis: Secondary | ICD-10-CM | POA: Diagnosis not present

## 2018-06-12 DIAGNOSIS — L821 Other seborrheic keratosis: Secondary | ICD-10-CM | POA: Diagnosis not present

## 2018-06-12 DIAGNOSIS — D2361 Other benign neoplasm of skin of right upper limb, including shoulder: Secondary | ICD-10-CM | POA: Diagnosis not present

## 2018-06-12 DIAGNOSIS — L851 Acquired keratosis [keratoderma] palmaris et plantaris: Secondary | ICD-10-CM | POA: Diagnosis not present

## 2018-06-19 DIAGNOSIS — Z79899 Other long term (current) drug therapy: Secondary | ICD-10-CM | POA: Diagnosis not present

## 2018-06-19 DIAGNOSIS — R296 Repeated falls: Secondary | ICD-10-CM | POA: Diagnosis not present

## 2018-06-19 DIAGNOSIS — G20 Parkinson's disease: Secondary | ICD-10-CM | POA: Diagnosis not present

## 2018-07-19 ENCOUNTER — Ambulatory Visit: Payer: Self-pay

## 2018-07-26 ENCOUNTER — Ambulatory Visit: Payer: Self-pay

## 2018-07-27 ENCOUNTER — Other Ambulatory Visit: Payer: Self-pay | Admitting: Hematology and Oncology

## 2018-07-27 DIAGNOSIS — Z853 Personal history of malignant neoplasm of breast: Secondary | ICD-10-CM

## 2018-08-02 ENCOUNTER — Ambulatory Visit (INDEPENDENT_AMBULATORY_CARE_PROVIDER_SITE_OTHER): Payer: Medicare Other

## 2018-08-02 DIAGNOSIS — Z23 Encounter for immunization: Secondary | ICD-10-CM | POA: Diagnosis not present

## 2018-08-04 ENCOUNTER — Other Ambulatory Visit: Payer: Self-pay | Admitting: Hematology and Oncology

## 2018-08-07 ENCOUNTER — Telehealth: Payer: Self-pay | Admitting: *Deleted

## 2018-08-07 NOTE — Telephone Encounter (Signed)
Attempted to schedule MRI last week but was told by Prentiss Bells at GI that she needed a mammogram 1st -per protocol.   I explained to her that this was a post neoadjuvant patient and she just needed an MRI to evaluate response and compare to previous MRI from 6/19.  She was to call me back after speaking with her supervisor.     11/19 - Spoke with patient and explained to her that she did not need a mammogram and only an MRI breast and that I would make sure it gets scheduled.    Called and spoke with Prentiss Bells at GI concerning patient's MRI breast.  Repeatedly explained to her the above about not needing the mammogram and why it is not needed and that MRI needs to be scheduled.  She still tells me that is protocol after checking with her Supervisor.  Informed her I would discuss with Dr. Rosana Hoes.   Per Dr. Rosana Hoes MRI breast is to be scheduled and Mammogram cancelled.  Spoke with Prentiss Bells and relayed the information to her to schedule MRI breast and to cancel Mammogram.    Moments later received call from patient stating that she was called by Prentiss Bells to schedule her MRI breast but was asked since it had been a year she needed her mammogram.  Informed patient again that she did not need her mammogram and that I had informed Prentiss Bells that it needed to be cancelled.  I explained to her that I would call Prentiss Bells back and mammogram would be cancelled appropriately.  Patient verbalized understanding.   Spoke with Prentiss Bells to let her know I received a phone call from patient stating "they wanted to leave my mammogram scheduled because it had been a year"  I had to explain the above to the patient again.  I again informed Prentiss Bells that the mammogram needs to be cancelled as I had already informed her earlier.    Confirmed that mammogram was cancelled.

## 2018-08-20 ENCOUNTER — Ambulatory Visit
Admission: RE | Admit: 2018-08-20 | Discharge: 2018-08-20 | Disposition: A | Discharge status: Home / Self Care | Payer: Medicare Other | Source: Ambulatory Visit | Attending: Hematology and Oncology | Admitting: Hematology and Oncology

## 2018-08-20 DIAGNOSIS — Z170 Estrogen receptor positive status [ER+]: Principal | ICD-10-CM

## 2018-08-20 DIAGNOSIS — C50512 Malignant neoplasm of lower-outer quadrant of left female breast: Secondary | ICD-10-CM | POA: Diagnosis not present

## 2018-08-20 MED ORDER — GADOBUTROL 1 MMOL/ML IV SOLN
5.0000 mL | Freq: Once | INTRAVENOUS | Status: AC | PRN
  Administered 2018-08-20: 5 mL via INTRAVENOUS

## 2018-08-24 ENCOUNTER — Inpatient Hospital Stay: Payer: Medicare Other | Attending: Hematology and Oncology | Admitting: Hematology and Oncology

## 2018-08-24 ENCOUNTER — Telehealth: Payer: Self-pay | Admitting: Hematology and Oncology

## 2018-08-24 DIAGNOSIS — Z880 Allergy status to penicillin: Secondary | ICD-10-CM | POA: Diagnosis not present

## 2018-08-24 DIAGNOSIS — C50512 Malignant neoplasm of lower-outer quadrant of left female breast: Secondary | ICD-10-CM | POA: Insufficient documentation

## 2018-08-24 DIAGNOSIS — Z79811 Long term (current) use of aromatase inhibitors: Secondary | ICD-10-CM | POA: Diagnosis not present

## 2018-08-24 DIAGNOSIS — G20 Parkinson's disease: Secondary | ICD-10-CM | POA: Insufficient documentation

## 2018-08-24 DIAGNOSIS — Z79899 Other long term (current) drug therapy: Secondary | ICD-10-CM

## 2018-08-24 DIAGNOSIS — G620 Drug-induced polyneuropathy: Secondary | ICD-10-CM | POA: Diagnosis not present

## 2018-08-24 DIAGNOSIS — Z170 Estrogen receptor positive status [ER+]: Secondary | ICD-10-CM | POA: Diagnosis not present

## 2018-08-24 NOTE — Assessment & Plan Note (Signed)
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 Neoadjuvant therapy with hormonal therapy based upon low riskOncotype DX testresult (score 10: 7% ROR) 2. on letrozole 2.5 mg daily 3. 6 months of neoadjuvant therapy followed by surgery depending on the degree of response, patient may be eligible for lumpectomy versus mastectomy 4. Followed by adjuvant radiation 5. Followed by antiestrogen therapy ---------------------------------------------------------------- Letrozole Toxicities: Intermittent mild hot flashes Denies any arthralgias or myalgias.  Breast MRI 02/21/2018: Interval decrease in the enhancement involving the breast cancer.  The primary breast tumor appears to be stable.  Breast MRI 08/20/2018: Persistent large area of non-mass enhancement 3.5 x 5.6 x 1.9 cm extending to the skin in the LOQ, satellite lesions 0.5 cm, peripheral nodularity  Radiology review: I discussed with the patient that her tumor is not adequately responding to treatment and that she should proceed with surgery with mastectomy. 

## 2018-08-24 NOTE — Progress Notes (Signed)
Patient Care Team: Carollee Herter, Alferd Apa, DO as PCP - General Star Age, MD as Consulting Physician (Neurology) Dian Queen, MD as Consulting Physician (Obstetrics and Gynecology) Lyndal Pulley, DO as Consulting Physician (Family Medicine) Eldridge Abrahams, MD as Consulting Physician (Neurology) Carolan Clines, MD as Consulting Physician (Urology) Jola Schmidt, MD as Consulting Physician (Ophthalmology)  DIAGNOSIS:  Encounter Diagnosis  Name Primary?  . 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  She underwent recent breast MRI and is here today accompanied by her husband and her daughter to discuss the results.  She has tolerated letrozole extremely well.  She does not have any hot flashes or arthralgias.  She has Parkinson's disease and has tremors and shuffling gait.  REVIEW OF SYSTEMS:   Constitutional: Denies fevers, chills or abnormal weight loss Eyes: Denies blurriness of vision Ears, nose, mouth, throat, and face: Denies mucositis or sore  throat Respiratory: Denies cough, dyspnea or wheezes Cardiovascular: Denies palpitation, chest discomfort Gastrointestinal:  Denies nausea, heartburn or change in bowel habits Skin: Denies abnormal skin rashes Lymphatics: Denies new lymphadenopathy or easy bruising Neurological: Parkinson's disease and shuffling gait Behavioral/Psych: Mood is stable, no new changes  Extremities: No lower extremity edema  All other systems were reviewed with the patient and are negative.  I have reviewed the past medical history, past surgical history, social history and family history with the patient and they are unchanged from previous note.  ALLERGIES:  is allergic to penicillins.  MEDICATIONS:  Current Outpatient Medications  Medication Sig Dispense Refill  . AMBULATORY NON FORMULARY MEDICATION Medication Name: Compression Stockings 20-16mHg  1 pair 1 each 0  . b complex vitamins tablet Take 1 tablet by mouth daily. MEGA FOOD BALANCE SOURCE    . Cholecalciferol (VITAMIN D3) 1000 UNIT/SPRAY LIQD Take 2 sprays by mouth 2 (two) times daily.     . hydrochlorothiazide (HYDRODIURIL) 25 MG tablet TAKE 1 TABLET BY MOUTH EVERY DAY 30 tablet 0  . letrozole (FEMARA) 2.5 MG tablet TAKE 1 TABLET BY MOUTH EVERY DAY 90 tablet 3  . LORazepam (ATIVAN) 0.5 MG tablet Take 1 tablet by mouth 1 hour before procedure, then 1 tablet right before. 2 tablet 0  . NON FORMULARY Grow Bone Raw Calcium/K+/ Magnesium/ D--   2 caps twice a day    . pramipexole (MIRAPEX) 1 MG tablet Take 1 tablet by mouth 3 (three) times daily.  3  . Probiotic Product (PROBIOTIC DAILY) CAPS Take by mouth as needed. Healthy Intestinal Flora    . sertraline (ZOLOFT) 50 MG tablet Take  50 mg by mouth daily.     Marland Kitchen triamcinolone cream (KENALOG) 0.1 % Apply 1 application topically 2 (two) times daily. 30 g 0   No current facility-administered medications for this visit.     PHYSICAL EXAMINATION: ECOG PERFORMANCE STATUS: 1 - Symptomatic but  completely ambulatory  Vitals:   08/24/18 1111  BP: (!) 113/53  Pulse: 67  Resp: 17  Temp: 98.4 F (36.9 C)  SpO2: 100%   Filed Weights   08/24/18 1111  Weight: 129 lb 6.4 oz (58.7 kg)    GENERAL:alert, no distress and comfortable SKIN: skin color, texture, turgor are normal, no rashes or significant lesions EYES: normal, Conjunctiva are pink and non-injected, sclera clear OROPHARYNX:no exudate, no erythema and lips, buccal mucosa, and tongue normal  NECK: supple, thyroid normal size, non-tender, without nodularity LYMPH:  no palpable lymphadenopathy in the cervical, axillary or inguinal LUNGS: clear to auscultation and percussion with normal breathing effort HEART: regular rate & rhythm and no murmurs and no lower extremity edema ABDOMEN:abdomen soft, non-tender and normal bowel sounds MUSCULOSKELETAL:no cyanosis of digits and no clubbing  NEURO: alert & oriented x 3 with fluent speech, Parkinson's disease EXTREMITIES: No lower extremity edema   LABORATORY DATA:  I have reviewed the data as listed CMP Latest Ref Rng & Units 08/18/2017 05/02/2017 05/09/2016  Glucose 70 - 99 mg/dL 98 88 55(L)  BUN 6 - 23 mg/dL 24(H) 17 20  Creatinine 0.40 - 1.20 mg/dL 0.99 0.87 0.92  Sodium 135 - 145 mEq/L 139 140 141  Potassium 3.5 - 5.1 mEq/L 3.8 3.7 3.7  Chloride 96 - 112 mEq/L 101 103 106  CO2 19 - 32 mEq/L 32 32 27  Calcium 8.4 - 10.5 mg/dL 9.5 9.5 9.2  Total Protein 6.0 - 8.3 g/dL - - 6.7  Total Bilirubin 0.2 - 1.2 mg/dL - - 0.3  Alkaline Phos 39 - 117 U/L - - 59  AST 0 - 37 U/L - - 18  ALT 0 - 35 U/L - - 15    Lab Results  Component Value Date   WBC 7.0 05/09/2016   HGB 13.0 05/09/2016   HCT 39.1 05/09/2016   MCV 87.5 05/09/2016   PLT 208.0 05/09/2016   NEUTROABS 5.1 05/09/2016    ASSESSMENT & PLAN:  Malignant neoplasm of lower-outer quadrant of left breast of female, estrogen receptor positive ([place]) 07/21/17: Left breast lower outer quadrant 5.4 x 2.3 x 3.8 cm mass along  with additional satellite nodules measuring 6 mm and several other small satellite nodules, no lymphadenopathy, 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 Neoadjuvant therapy with hormonal therapy based upon low riskOncotype DX testresult (score 10: 7% ROR) 2. on letrozole 2.5 mg daily 3. 6 months of neoadjuvant therapy followed by surgery depending on the degree of response, patient may be eligible for lumpectomy versus mastectomy 4. Followed by adjuvant radiation 5. Followed by antiestrogen therapy ---------------------------------------------------------------- Letrozole Toxicities: Intermittent mild hot flashes Denies any arthralgias or myalgias.  Breast MRI 02/21/2018: Interval decrease in the enhancement involving the breast cancer.  The primary breast tumor appears to be stable.  Breast MRI 08/20/2018: Persistent large area of non-mass enhancement 3.5 x 5.6 x 1.9 cm extending to the skin in the LOQ, satellite lesions 0.5 cm, peripheral nodularity  Radiology review: I discussed with the patient that her tumor is not adequately responding  to treatment and that she should proceed with surgery with mastectomy.  I will request Dr. Brantley Stage to see the patient.  She will also need plastic surgery referral.  No orders of the defined types were placed in this encounter.  The patient has a good understanding of the overall plan. she agrees with it. she will call with any problems that may develop before the next visit here.   Harriette Ohara, MD 08/24/18

## 2018-08-24 NOTE — Telephone Encounter (Signed)
No 12/6 los

## 2018-08-31 DIAGNOSIS — C50912 Malignant neoplasm of unspecified site of left female breast: Secondary | ICD-10-CM | POA: Diagnosis not present

## 2018-09-13 DIAGNOSIS — C50512 Malignant neoplasm of lower-outer quadrant of left female breast: Secondary | ICD-10-CM | POA: Diagnosis not present

## 2018-09-13 DIAGNOSIS — Z170 Estrogen receptor positive status [ER+]: Secondary | ICD-10-CM | POA: Diagnosis not present

## 2018-09-21 DIAGNOSIS — H2513 Age-related nuclear cataract, bilateral: Secondary | ICD-10-CM | POA: Diagnosis not present

## 2018-09-21 DIAGNOSIS — H524 Presbyopia: Secondary | ICD-10-CM | POA: Diagnosis not present

## 2018-09-26 DIAGNOSIS — G20 Parkinson's disease: Secondary | ICD-10-CM | POA: Diagnosis not present

## 2018-09-26 DIAGNOSIS — Z880 Allergy status to penicillin: Secondary | ICD-10-CM | POA: Diagnosis not present

## 2018-09-26 DIAGNOSIS — Z79899 Other long term (current) drug therapy: Secondary | ICD-10-CM | POA: Diagnosis not present

## 2018-09-26 DIAGNOSIS — G479 Sleep disorder, unspecified: Secondary | ICD-10-CM | POA: Diagnosis not present

## 2018-10-01 ENCOUNTER — Ambulatory Visit: Payer: Self-pay | Admitting: Surgery

## 2018-10-01 DIAGNOSIS — C50919 Malignant neoplasm of unspecified site of unspecified female breast: Secondary | ICD-10-CM | POA: Diagnosis not present

## 2018-10-01 DIAGNOSIS — C50912 Malignant neoplasm of unspecified site of left female breast: Secondary | ICD-10-CM

## 2018-10-01 NOTE — H&P (Signed)
Judye Bos Documented: 10/01/2018 2:17 PM Location: [facility] Patient #: [id number] DOB: 1946/05/10 Married / Language: Cleophus Molt / Race: White Female   History of Present Illness Marcello Moores A. Lester Platas MD; 10/01/2018 2:49 PM) Patient words: The patient returns for follow-up after neoadjuvant chemotherapy for T3 N0 MX left breast cancer. She was seen in January. She opted for neoadjuvant anti-hormonal therapy. He remains recent MRI shows minimal change to a 5 cm left breast cancer in the lower outer quadrant. Otherwise, she is in good spirits. She does have Parkinson's disease and this is remained stable. He is here today to talk about surgical options and potential treatment options. She is accompanied by her husband.   She saw plastic surgery but they did not feel reconstruction appropriate in this setting given possible radiation issues and advanced age.  The patient is a 73 year old female.   Allergies (Sabrina Canty, CMA; 10/01/2018 2:18 PM) Penicillins  Anaphylaxis. Sulfa Drugs  Dermatitis, Diarrhea. Allergies Reconciled   Medication History Nance Pew, CMA; 10/01/2018 2:18 PM) Carbidopa-Levodopa (25-100MG  Tablet, Oral) Active. Pramipexole Dihydrochloride (1MG  Tablet, Oral) Active. Letrozole (2.5MG  Tablet, Oral) Active. Sertraline HCl (50MG  Tablet, Oral) Active. B Complex (Oral) Active. (mega food) DHEA (Oral) Specific strength unknown - Active. Biest/Progesterone (Transdermal) Active. brompton (Raw Calcium) Active. Probiotic (Oral) Active. Vitamin D3 (1000UNIT/SPRAY Liquid, Oral) Active. Medications Reconciled  Vitals (Sabrina Canty CMA; 10/01/2018 2:19 PM) 10/01/2018 2:18 PM Weight: 129.13 lb Height: 64.5in Body Surface Area: 1.63 m Body Mass Index: 21.82 kg/m  Temp.: 97.72F(Temporal)  Pulse: 99 (Regular)  BP: 114/68 (Sitting, Right Arm, Standard)       Physical Exam (Anarosa Kubisiak A. Celsa Nordahl MD; 10/01/2018 2:50  PM) General Mental Status-Alert. General Appearance-Consistent with stated age. Hydration-Well hydrated. Voice-Normal.  Head and Neck Head-normocephalic, atraumatic with no lesions or palpable masses.  Breast Note: Left breast mass stable 3.4 cm by examination.   Neurologic Neurologic evaluation reveals -alert and oriented x 3 with no impairment of recent or remote memory. Mental Status-Normal.    Assessment & Plan (Trask Vosler A. Kaely Hollan MD; 10/01/2018 2:49 PM) BREAST CANCER (C50.919) Impression: Left breast lobular phenotype  Not felt to be a good reconstruction candidate for now. She may require radiation therapy  Proceed with left simple mastectomy with sentinel lymph node mapping. Risk of sentinel lymph node mapping include bleeding, infection, lymphedema, shoulder pain. stiffness, dye allergy. cosmetic deformity , blood clots, death, need for more surgery. Pt agres to proceed. Discussed treatment options for breast cancer to include breast conservation vs mastectomy with reconstruction. Pt has decided on mastectomy. Risk include bleeding, infection, flap necrosis, pain, numbness, recurrence, hematoma, other surgery needs. Pt understands and agrees to proceed.    Patient is a minimal change in size. She has many questions and examination breast reconstruction. I will refer her to plastic surgery for evaluation. She will continue on her antirejection therapy. I'll see her back next month to finalize her surgical planning but may wish to proceed with left breast mastectomy given the minimal change in size and the possibility of needing radiation therapy after her mastectomy. Current Plans You are being scheduled for surgery- Our schedulers will call you.  You should hear from our office's scheduling department within 5 working days about the location, date, and time of surgery. We try to make accommodations for patient's preferences in scheduling surgery, but  sometimes the OR schedule or the surgeon's schedule prevents [place] from making those accommodations.  If you have not heard from our office [phone]) in  5 working days, call the office and ask for your surgeon's nurse.  If you have other questions about your diagnosis, plan, or surgery, call the office and ask for your surgeon's nurse.  We discussed the staging and pathophysiology of breast cancer. We discussed all of the different options for treatment for breast cancer including surgery, chemotherapy, radiation therapy, Herceptin, and antiestrogen therapy. We discussed a sentinel lymph node biopsy as she does not appear to having lymph node involvement right now. We discussed the performance of that with injection of radioactive tracer and blue dye. We discussed that she would have an incision underneath her axillary hairline. We discussed that there is a bout a 10-20% chance of having a positive node with a sentinel lymph node biopsy and we will await the permanent pathology to make any other first further decisions in terms of her treatment. One of these options might be to return to the operating room to perform an axillary lymph node dissection. We discussed about a 1-2% risk lifetime of chronic shoulder pain as well as lymphedema associated with a sentinel lymph node biopsy. We discussed the options for treatment of the breast cancer which included lumpectomy versus a mastectomy. We discussed the performance of the lumpectomy with a wire placement. We discussed a 10-20% chance of a positive margin requiring reexcision in the operating room. We also discussed that she may need radiation therapy or antiestrogen therapy or both if she undergoes lumpectomy. We discussed the mastectomy and the postoperative care for that as well. We discussed that there is no difference in her survival whether she undergoes lumpectomy with radiation therapy or antiestrogen therapy versus a mastectomy. There is a slight  difference in the local recurrence rate being 3-5% with lumpectomy and about 1% with a mastectomy. We discussed the risks of operation including bleeding, infection, possible reoperation. She understands her further therapy will be based on what her stages at the time of her operation.  Pt Education - Pamphlet Given - Breast Biopsy: discussed with patient and provided information. Pt Education - [facility]CS Mastectomy HCI   Signed by Turner Daniels, MD (10/01/2018 2:53 PM)

## 2018-10-04 ENCOUNTER — Telehealth: Payer: Self-pay | Admitting: Hematology and Oncology

## 2018-10-04 NOTE — Telephone Encounter (Signed)
Called patient per 1/14 sch message - left message and sent reminder letter in the mail.

## 2018-10-30 ENCOUNTER — Telehealth: Payer: Self-pay | Admitting: Hematology and Oncology

## 2018-10-30 NOTE — Pre-Procedure Instructions (Signed)
Amanda [name]  10/30/2018      [id number] - [address], [address] [place] [address] Phone: [phone] Fax: [phone]    Your procedure is scheduled on Thursday February 20th.  Report to [facility] Admitting at 5:30 A.M.  Call this number if you have problems the morning of surgery:  [phone]   Remember:  Do not eat after midnight. You may drink clear liquids until 4:30 AM. Clear liquids include water, non-citrus juices without pulp, carbonated  beverages,  clear tea, black coffee and gatorade.     Take these medicines the morning of surgery with A SIP OF WATER  carbidopa-levodopa (SINEMET IR)  pramipexole (MIRAPEX)   Follow your surgeon's instructions on when to stop Asprin.  If no instructions were given by your surgeon then you will need to call the office to get those instructions.    7 days prior to surgery STOP taking any Aspirin(unless otherwise instructed by your surgeon), Aleve, Naproxen, Ibuprofen, Motrin, Advil, Goody's, BC's, all herbal medications, fish oil, and all vitamins     Do not wear jewelry, make-up or nail polish.  Do not wear lotions, powders, or perfumes, or deodorant.  Do not shave 48 hours prior to surgery.    Do not bring valuables to the hospital.  [facility] is not responsible for any belongings or valuables.  Contacts, dentures or bridgework may not be worn into surgery.  Leave your suitcase in the car.  After surgery it may be brought to your room.  For patients admitted to the hospital, discharge time will be determined by your treatment team.  Patients discharged the day of surgery will not be allowed to drive home.   [place]- Preparing For Surgery  Before surgery, you can play an important role. Because skin is not sterile, your skin needs to be as free of germs as possible. You can reduce the number of germs on your skin by washing with CHG (chlorahexidine gluconate)  Soap before surgery.  CHG is an antiseptic cleaner which kills germs and bonds with the skin to continue killing germs even after washing.    Oral Hygiene is also important to reduce your risk of infection.  Remember - BRUSH YOUR TEETH THE MORNING OF SURGERY WITH YOUR REGULAR TOOTHPASTE  Please do not use if you have an allergy to CHG or antibacterial soaps. If your skin becomes reddened/irritated stop using the CHG.  Do not shave (including legs and underarms) for at least 48 hours prior to first CHG shower. It is OK to shave your face.  Please follow these instructions carefully.   1. Shower the NIGHT BEFORE SURGERY and the MORNING OF SURGERY with CHG.   2. If you chose to wash your hair, wash your hair first as usual with your normal shampoo.  3. After you shampoo, rinse your hair and body thoroughly to remove the shampoo.  4. Use CHG as you would any other liquid soap. You can apply CHG directly to the skin and wash gently with a scrungie or a clean washcloth.   5. Apply the CHG Soap to your body ONLY FROM THE NECK DOWN.  Do not use on open wounds or open sores. Avoid contact with your eyes, ears, mouth and genitals (private parts). Wash Face and genitals (private parts)  with your normal soap.  6. Wash thoroughly, paying special attention to the area where your surgery will be performed.  7. Thoroughly rinse your  body with warm water from the neck down.  8. DO NOT shower/wash with your normal soap after using and rinsing off the CHG Soap.  9. Pat yourself dry with a CLEAN TOWEL.  10. Wear CLEAN PAJAMAS to bed the night before surgery, wear comfortable clothes the morning of surgery  11. Place CLEAN SHEETS on your bed the night of your first shower and DO NOT SLEEP WITH PETS.    Day of Surgery: Shower as stated above. Do not apply any deodorants/lotions.  Please wear clean clothes to the hospital/surgery center.   Remember to brush your teeth WITH YOUR REGULAR  TOOTHPASTE.   Please read over the following fact sheets that you were given.

## 2018-10-30 NOTE — Telephone Encounter (Signed)
[facility] CME 2/28 moved f/u to 3/3. Spoke with patient.

## 2018-10-31 ENCOUNTER — Other Ambulatory Visit: Payer: Self-pay

## 2018-10-31 ENCOUNTER — Encounter (HOSPITAL_COMMUNITY)
Admission: RE | Admit: 2018-10-31 | Discharge: 2018-10-31 | Disposition: A | Discharge status: Home / Self Care | Payer: Medicare Other | Source: Ambulatory Visit | Attending: Surgery | Admitting: Surgery

## 2018-10-31 ENCOUNTER — Encounter (HOSPITAL_COMMUNITY): Payer: Self-pay

## 2018-10-31 DIAGNOSIS — Z01818 Encounter for other preprocedural examination: Secondary | ICD-10-CM | POA: Insufficient documentation

## 2018-10-31 DIAGNOSIS — C50912 Malignant neoplasm of unspecified site of left female breast: Secondary | ICD-10-CM

## 2018-10-31 LAB — CBC WITH DIFFERENTIAL/PLATELET
ABS IMMATURE GRANULOCYTES: 0.01 10*3/uL (ref 0.00–0.07)
BASOS PCT: 1 %
Basophils Absolute: 0 10*3/uL (ref 0.0–0.1)
EOS PCT: 2 %
Eosinophils Absolute: 0.1 10*3/uL (ref 0.0–0.5)
HCT: 37 % (ref 36.0–46.0)
Hemoglobin: 12.7 g/dL (ref 12.0–15.0)
Immature Granulocytes: 0 %
Lymphocytes Relative: 23 %
Lymphs Abs: 0.9 10*3/uL (ref 0.7–4.0)
MCH: 31.9 pg (ref 26.0–34.0)
MCHC: 34.3 g/dL (ref 30.0–36.0)
MCV: 93 fL (ref 80.0–100.0)
MONO ABS: 0.4 10*3/uL (ref 0.1–1.0)
Monocytes Relative: 9 %
Neutro Abs: 2.6 10*3/uL (ref 1.7–7.7)
Neutrophils Relative %: 65 %
Platelets: 631 10*3/uL — ABNORMAL HIGH (ref 150–400)
RBC: 3.98 MIL/uL (ref 3.87–5.11)
RDW: 14.2 % (ref 11.5–15.5)
WBC: 4 10*3/uL (ref 4.0–10.5)
nRBC: 1.5 % — ABNORMAL HIGH (ref 0.0–0.2)

## 2018-10-31 LAB — COMPREHENSIVE METABOLIC PANEL
ALT: <5 U/L (ref 0–44)
AST: 32 U/L (ref 15–41)
Albumin: 3.9 g/dL (ref 3.5–5.0)
Alkaline Phosphatase: 54 U/L (ref 38–126)
Anion gap: 11 (ref 5–15)
BUN: 18 mg/dL (ref 8–23)
CO2: 25 mmol/L (ref 22–32)
CREATININE: 0.92 mg/dL (ref 0.44–1.00)
Calcium: 9.3 mg/dL (ref 8.9–10.3)
Chloride: 104 mmol/L (ref 98–111)
GFR calc non Af Amer: >60 mL/min (ref >60)
Glucose, Bld: 93 mg/dL (ref 70–99)
Potassium: 4.4 mmol/L (ref 3.5–5.1)
Sodium: 140 mmol/L (ref 135–145)
Total Bilirubin: 0.9 mg/dL (ref 0.3–1.2)
Total Protein: 6.4 g/dL — ABNORMAL LOW (ref 6.5–8.1)

## 2018-10-31 NOTE — Progress Notes (Signed)
PCP - Dr. Etter Sjogren Cardiologist - denies  Chest x-ray - N/A EKG - 11/01/18  Stress Test - denies ECHO - 03/05/18 Cardiac Cath - denies  Sleep Study - denies  Aspirin Instructions: Patient instructed to hold all Aspirin, NSAID's, herbal medications, fish oil and vitamins 7 days prior to surgery.   Anesthesia review:   Patient denies shortness of breath, fever, cough and chest pain at PAT appointment   Patient verbalized understanding of instructions that were given to them at the PAT appointment. Patient was also instructed that they will need to review over the PAT instructions again at home before surgery.

## 2018-11-07 ENCOUNTER — Encounter (HOSPITAL_COMMUNITY): Payer: Self-pay | Admitting: Certified Registered Nurse Anesthetist

## 2018-11-07 MED ORDER — CLINDAMYCIN PHOSPHATE 900 MG/50ML IV SOLN
900.0000 mg | INTRAVENOUS | Status: AC
  Administered 2018-11-08: 900 mg via INTRAVENOUS
  Filled 2018-11-07 (×2): qty 50

## 2018-11-07 NOTE — Anesthesia Preprocedure Evaluation (Addendum)
Anesthesia Evaluation  Patient identified by MRN, date of birth, ID band Patient awake    Reviewed: Allergy & Precautions, NPO status , Patient's Chart, lab work & pertinent test results  Airway Mallampati: I       Dental no notable dental hx. (+) Teeth Intact   Pulmonary neg pulmonary ROS,    Pulmonary exam normal breath sounds clear to auscultation       Cardiovascular Normal cardiovascular exam Rhythm:Regular Rate:Normal     Neuro/Psych PSYCHIATRIC DISORDERS Anxiety negative neurological ROS     GI/Hepatic negative GI ROS, Neg liver ROS,   Endo/Other  negative endocrine ROS  Renal/GU negative Renal ROS  negative genitourinary   Musculoskeletal negative musculoskeletal ROS (+)   Abdominal Normal abdominal exam  (+)   Peds  Hematology negative hematology ROS (+)   Anesthesia Other Findings Result Notes for ECHOCARDIOGRAM COMPLETE   Notes recorded by Shirley Muscat, RN on 03/06/2018 at 1:43 PM EDT Pt aware of results  ------  Notes recorded by Shirley Muscat, RN on 03/06/2018 at 12:50 PM EDT Left message to call back   ------  Notes recorded by Larey Dresser, MD on 03/05/2018 at 9:38 PM EDT Normal EF   Study Result   Result status: Final result                          [facility]                        [address], [place] [address]                            [phone]  ------------------------------------------------------------------- Transthoracic Echocardiography  Patient:    Amanda [name], Amanda [name] MR #:       [id number] Study Date: 03/05/2018 Gender:     F Age:        73 Height:     162.6 cm Weight:     57 kg BSA:        1.61 m^2 Pt. Status: Room:   ATTENDING    Kirk Ruths  [facility]     Loralie Champagne, M.D.  REFERRING    Loralie Champagne, M.D.  SONOGRAPHER  Marygrace Drought, RCS  PERFORMING   [facility],  Outpatient  cc:  ------------------------------------------------------------------- LV EF: 60% -   65%  ------------------------------------------------------------------- Indications:      Malignant neoplasm of upper quadrant (C50.512).   ------------------------------------------------------------------- Study Conclusions  - Left ventricle: The cavity size was normal. Wall thickness was   normal. Systolic function was normal. The estimated ejection   fraction was in the range of 60% to 65%. Wall motion was normal;   there were no regional wall motion abnormalities. Left   ventricular diastolic function parameters were normal. - Pericardium, extracardiac: A trivial pericardial effusion was   identified.  Impressions:  - Normal LV systolic and diastolic function; global longitudinal   strain - 18.1%.  ------------------------------------------------------------------- Study data:  No prior study was available for comparison.  Study status:  Routine.  Procedure:  The patient reported no pain pre or post test. Transthoracic echocardiography. Image quality was adequate.          Transthoracic echocardiography.  M-mode, complete 2D, spectral Doppler, and color Doppler.  Birthdate:  Patient birthdate: [birth year].  Age:  Patient is 73 yr old.  Sex: Gender: female.    BMI: 21.5 kg/m^2.  Blood pressure:     120/64 Patient status:  Outpatient.  Study date:  Study date: 03/05/2018. Study time: 09:34 AM.  Location:  [facility]  -------------------------------------------------------------------  ------------------------------------------------------------------- Left ventricle:  The cavity size was normal. Wall thickness was normal. Systolic function was normal. The estimated ejection fraction was in the range of 60% to 65%. Wall motion was normal; there were no regional wall motion abnormalities. Left ventricular diastolic function parameters were  normal.  ------------------------------------------------------------------- Aortic valve:   Trileaflet; normal thickness leaflets. Mobility was not restricted.  Doppler:  Transvalvular velocity was within the normal range. There was no stenosis. There was no regurgitation.   ------------------------------------------------------------------- Aorta:  Aortic root: The aortic root was normal in size.  ------------------------------------------------------------------- Mitral valve:   Structurally normal valve.   Mobility was not restricted.  Doppler:  Transvalvular velocity was within the normal range. There was no evidence for stenosis. There was trivial regurgitation.    Peak gradient (D): 4 mm Hg.  ------------------------------------------------------------------- Left atrium:  The atrium was normal in size.  ------------------------------------------------------------------- Right ventricle:  The cavity size was normal. Systolic function was normal.  ------------------------------------------------------------------- Pulmonic valve:    Doppler:  Transvalvular velocity was within the normal range. There was no evidence for stenosis.  ------------------------------------------------------------------- Tricuspid valve:   Structurally normal valve.    Doppler: Transvalvular velocity was within the normal range. There was mild regurgitation.  ------------------------------------------------------------------- Pulmonary artery:   Systolic pressure was within the normal range.   ------------------------------------------------------------------- Right atrium:  The atrium was normal in size.  ------------------------------------------------------------------- Pericardium:  A trivial pericardial effusion was identified.  ------------------------------------------------------------------- Systemic veins: Inferior vena cava: The vessel was normal in size.  The respirophasic diameter changes were in the normal range (= 50%), consistent with normal central venous pressure. Diameter: 14 mm.  ------------------------------------------------------------------- Measurements   IVC                                        Value        Reference  ID                                         14    mm     ----------    Left ventricle                             Value        Reference  LV ID, ED, PLAX chordal                    44.2  mm     43 - 52  LV ID, ES, PLAX chordal                    26    mm     23 - 38  LV fx shortening, PLAX chordal             41    %      >=29  LV PW thickness, ED  9     mm     ----------  IVS/LV PW ratio, ED                        0.7          <=1.3  Stroke volume, 2D                          59    ml     ----------  Stroke volume/bsa, 2D                      37    ml/m^2 ----------  LV e&', lateral                             10.8  cm/s   ----------  LV E/e&', lateral                           9.2          ----------  LV e&', medial                              9.9   cm/s   ----------  LV E/e&', medial                            10.04        ----------  LV e&', average                             10.35 cm/s   ----------  LV E/e&', average                           9.6          ----------  Longitudinal strain, TDI                   18    %      ----------    Ventricular septum                         Value        Reference  IVS thickness, ED                          6.3   mm     ----------    LVOT                                       Value        Reference  LVOT ID, S                                 17    mm     ----------  LVOT area                                  2.27  cm^2   ----------  LVOT peak velocity, S                      108   cm/s   ----------  LVOT mean velocity, S                      75.1  cm/s   ----------  LVOT VTI, S                                25.8  cm     ----------     Aorta                                      Value        Reference  Aortic root ID, ED                         27    mm     ----------    Left atrium                                Value        Reference  LA ID, A-P, ES                             31    mm     ----------  LA ID/bsa, A-P                             1.93  cm/m^2 <=2.2  LA volume, S                               34.7  ml     ----------  LA volume/bsa, S                           21.6  ml/m^2 ----------  LA volume, ES, 1-p A4C                     25.3  ml     ----------  LA volume/bsa, ES, 1-p A4C                 15.8  ml/m^2 ----------  LA volume, ES, 1-p A2C                     44.1  ml     ----------  LA volume/bsa, ES, 1-p A2C                 27.5  ml/m^2 ----------    Mitral valve                               Value        Reference  Mitral E-wave peak velocity                99.4  cm/s   ----------  Mitral A-wave peak velocity  66    cm/s   ----------  Mitral deceleration time            (H)    246   ms     150 - 230  Mitral peak gradient, D                    4     mm Hg  ----------  Mitral E/A ratio, peak                     1.5          ----------    Pulmonary arteries                         Value        Reference  PA pressure, S, DP                         27    mm Hg  <=30    Tricuspid valve                            Value        Reference  Tricuspid regurg peak velocity             247   cm/s   ----------  Tricuspid peak RV-RA gradient              24    mm Hg  ----------  Tricuspid maximal regurg velocity,         247   cm/s   ----------  PISA    Right atrium                               Value        Reference  RA ID, S-I, ES, A4C                 (H)    49.5  mm     34 - 49  RA area, ES, A4C                           16.3  cm^2   8.3 - 19.5  RA volume, ES, A/L                         46.4  ml     ----------  RA volume/bsa, ES, A/L                     28.9  ml/m^2 ----------    Systemic  veins                             Value        Reference  Estimated CVP                              3     mm Hg  ----------    Right ventricle                            Value  Reference  TAPSE                                      22.7  mm     ----------  RV pressure, S, DP                         27    mm Hg  <=30  RV s&', lateral, S                          10.6  cm/s   ----------  Legend: (L)  and  (H)  mark values outside specified reference range.  ------------------------------------------------------------------- Prepared and Electronically Authenticated by  Kirk Ruths 2019-06-17T14:17:07 [employer] Images   Show images for ECHOCARDIOGRAM COMPLETE MERGE Images   Show images for ECHOCARDIOGRAM COMPLETE Performing Technologist/Nurse   Performing Technologist/Nurse: Trey Paula Reason for Exam  Priority: Routine  Dx: Malignant neoplasm of upper-outer quadrant of left breast in female, estrogen receptor positive ([place]) (C50.412, Z17.0 (ICD-10-CM)); Malignant neoplasm of central portion of right breast in female, estrogen receptor positive ([place]) (C50.121, Z17.0 (ICD-10-CM)); Estrogen receptor positive (Z17.0 (ICD-10-CM)) Comments:             Surgical History   Surgical History    No past medical history on file.  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 Provider COLONOSCOPY  last one 2007  Provider CYSTOCELE REPAIR N/A 01/11/2016 Procedure: ANTERIOR REPAIR (CYSTOCELE) COLOPLAST FASCIA SACROSPINOUS FIXATION; Surgeon: Carolan Clines, MD; Location: [facility]; Service: Urology; Laterality: N/A; Provider CYSTOSCOPY W/ URETERAL STENT PLACEMENT Bilateral 01/11/2016 Procedure: CYSTOSCOPY WITH BILATERAL RETROGRADE PYELOGRAM/URETERAL STENT PLACEMENT; Surgeon: Carolan Clines, MD; Location:  [facility]; Service: Urology; Laterality: Bilateral; Provider ESOPHAGOGASTRODUODENOSCOPY  2007  Provider EXTERNAL EAR SURGERY Bilateral age 76  Provider LAPAROSCOPIC OVARIAN CYSTECTOMY  March 2008 unilateral Provider PUBOVAGINAL SLING N/A 01/11/2016 Procedure: ALTIS SLING SINGLE INCISION; Surgeon: Carolan Clines, MD; Location: [facility]; Service: Urology; Laterality: N/A; Provider RECTOCELE REPAIR N/A 01/11/2016 Procedure: POSTERIOR REPAIR (RECTOCELE), PERINEAPLASTY; Surgeon: Dian Queen, MD; Location: [facility]; Service: Gynecology; Laterality: N/A; Provider VAGINAL HYSTERECTOMY N/A 01/11/2016 Procedure: TOTAL VAGINAL HYSTERECTOMY ; Surgeon: Dian Queen, MD; Location: [facility]; Service: Gynecology; Laterality: N/A; Provider  Implants    No active implants to display in this view. Order-Level Documents:   There are no order-level documents.  Encounter-Level Documents - 03/05/2018:   Electronic signature on 03/05/2018 9:33 AM - Signed  Electronic signature on 03/05/2018 9:33 AM - Signed    Resulted by:   Signed Date/Time  Phone Pager Lelon Perla 03/05/2018 2:17 PM [phone]  External Result Report    External Result Report      Reproductive/Obstetrics                            Anesthesia Physical Anesthesia Plan  ASA: II  Anesthesia Plan: General   Post-op Pain Management:  Regional for Post-op pain   Induction: Intravenous  PONV Risk Score and Plan: 3 and Ondansetron and Dexamethasone  Airway Management Planned: LMA  Additional Equipment:   Intra-op Plan:   Post-operative Plan: Extubation in OR  Informed Consent: I have  reviewed the patients History and Physical, chart, labs and discussed the procedure including the risks, benefits and alternatives for the proposed anesthesia with the patient or authorized representative who has indicated  his/her understanding and acceptance.     Dental advisory given  Plan Discussed with: CRNA  Anesthesia Plan Comments:        Anesthesia Quick Evaluation

## 2018-11-08 ENCOUNTER — Ambulatory Visit (HOSPITAL_COMMUNITY): Payer: Medicare Other | Admitting: Certified Registered Nurse Anesthetist

## 2018-11-08 ENCOUNTER — Ambulatory Visit (HOSPITAL_COMMUNITY)
Admission: RE | Admit: 2018-11-08 | Discharge: 2018-11-08 | Disposition: A | Discharge status: Home / Self Care | Payer: Medicare Other | Source: Ambulatory Visit | Attending: Surgery | Admitting: Surgery

## 2018-11-08 ENCOUNTER — Other Ambulatory Visit: Payer: Self-pay

## 2018-11-08 ENCOUNTER — Observation Stay (HOSPITAL_COMMUNITY)
Admission: RE | Admit: 2018-11-08 | Discharge: 2018-11-09 | Disposition: A | Discharge status: Home / Self Care | Payer: Medicare Other | Attending: Surgery | Admitting: Surgery

## 2018-11-08 ENCOUNTER — Encounter (HOSPITAL_COMMUNITY): Payer: Self-pay

## 2018-11-08 ENCOUNTER — Encounter (HOSPITAL_COMMUNITY)
Admission: RE | Disposition: A | Discharge status: Home / Self Care | Payer: Self-pay | Source: Home / Self Care | Attending: Surgery

## 2018-11-08 DIAGNOSIS — C50512 Malignant neoplasm of lower-outer quadrant of left female breast: Principal | ICD-10-CM | POA: Insufficient documentation

## 2018-11-08 DIAGNOSIS — C50912 Malignant neoplasm of unspecified site of left female breast: Secondary | ICD-10-CM | POA: Diagnosis not present

## 2018-11-08 DIAGNOSIS — Z79899 Other long term (current) drug therapy: Secondary | ICD-10-CM | POA: Diagnosis not present

## 2018-11-08 DIAGNOSIS — G8918 Other acute postprocedural pain: Secondary | ICD-10-CM | POA: Diagnosis not present

## 2018-11-08 DIAGNOSIS — F419 Anxiety disorder, unspecified: Secondary | ICD-10-CM | POA: Diagnosis not present

## 2018-11-08 DIAGNOSIS — G20 Parkinson's disease: Secondary | ICD-10-CM | POA: Insufficient documentation

## 2018-11-08 DIAGNOSIS — Z170 Estrogen receptor positive status [ER+]: Secondary | ICD-10-CM | POA: Insufficient documentation

## 2018-11-08 DIAGNOSIS — Z9221 Personal history of antineoplastic chemotherapy: Secondary | ICD-10-CM | POA: Diagnosis not present

## 2018-11-08 HISTORY — PX: SIMPLE MASTECTOMY WITH AXILLARY SENTINEL NODE BIOPSY: SHX6098

## 2018-11-08 SURGERY — SIMPLE MASTECTOMY WITH AXILLARY SENTINEL NODE BIOPSY
Anesthesia: General | Site: Breast | Laterality: Left

## 2018-11-08 MED ORDER — MIDAZOLAM HCL 5 MG/5ML IJ SOLN
INTRAMUSCULAR | Status: DC | PRN
  Administered 2018-11-08: 0.5 mg via INTRAVENOUS

## 2018-11-08 MED ORDER — SODIUM CHLORIDE 0.9 % IV SOLN
INTRAVENOUS | Status: DC | PRN
  Administered 2018-11-08: 30 ug/min via INTRAVENOUS

## 2018-11-08 MED ORDER — 0.9 % SODIUM CHLORIDE (POUR BTL) OPTIME
TOPICAL | Status: DC | PRN
  Administered 2018-11-08: 1000 mL

## 2018-11-08 MED ORDER — PROPOFOL 10 MG/ML IV BOLUS
INTRAVENOUS | Status: DC | PRN
  Administered 2018-11-08: 20 mg via INTRAVENOUS
  Administered 2018-11-08: 100 mg via INTRAVENOUS

## 2018-11-08 MED ORDER — FENTANYL CITRATE (PF) 100 MCG/2ML IJ SOLN
INTRAMUSCULAR | Status: DC | PRN
  Administered 2018-11-08 (×3): 25 ug via INTRAVENOUS

## 2018-11-08 MED ORDER — ACETAMINOPHEN 160 MG/5ML PO SOLN: 325.0000 mg | ORAL | Status: DC | PRN

## 2018-11-08 MED ORDER — KETOROLAC TROMETHAMINE 15 MG/ML IJ SOLN
INTRAMUSCULAR | Status: AC
  Filled 2018-11-08: qty 1

## 2018-11-08 MED ORDER — ONDANSETRON HCL 4 MG/2ML IJ SOLN
INTRAMUSCULAR | Status: DC | PRN
  Administered 2018-11-08: 4 mg via INTRAVENOUS

## 2018-11-08 MED ORDER — METHYLENE BLUE 0.5 % INJ SOLN
INTRAVENOUS | Status: AC
  Filled 2018-11-08: qty 10

## 2018-11-08 MED ORDER — DEXAMETHASONE SODIUM PHOSPHATE 10 MG/ML IJ SOLN
INTRAMUSCULAR | Status: AC
  Filled 2018-11-08: qty 1

## 2018-11-08 MED ORDER — GABAPENTIN 300 MG PO CAPS
300.0000 mg | ORAL_CAPSULE | Freq: Two times a day (BID) | ORAL | Status: DC
  Administered 2018-11-08 – 2018-11-09 (×3): 300 mg via ORAL
  Filled 2018-11-08 (×3): qty 1

## 2018-11-08 MED ORDER — ACETAMINOPHEN 325 MG PO TABS: 325.0000 mg | ORAL_TABLET | ORAL | Status: DC | PRN

## 2018-11-08 MED ORDER — ONDANSETRON HCL 4 MG/2ML IJ SOLN: 4.0000 mg | Freq: Four times a day (QID) | INTRAMUSCULAR | Status: DC | PRN

## 2018-11-08 MED ORDER — TRAMADOL HCL 50 MG PO TABS: 50.0000 mg | ORAL_TABLET | Freq: Four times a day (QID) | ORAL | Status: DC | PRN

## 2018-11-08 MED ORDER — PROPOFOL 10 MG/ML IV BOLUS
INTRAVENOUS | Status: AC
  Filled 2018-11-08: qty 20

## 2018-11-08 MED ORDER — GABAPENTIN 300 MG PO CAPS
300.0000 mg | ORAL_CAPSULE | ORAL | Status: AC
  Administered 2018-11-08: 300 mg via ORAL
  Filled 2018-11-08: qty 1

## 2018-11-08 MED ORDER — CHLORHEXIDINE GLUCONATE CLOTH 2 % EX PADS: 6.0000 | MEDICATED_PAD | Freq: Once | CUTANEOUS | Status: DC

## 2018-11-08 MED ORDER — ROCURONIUM BROMIDE 50 MG/5ML IV SOSY
PREFILLED_SYRINGE | INTRAVENOUS | Status: AC
  Filled 2018-11-08: qty 5

## 2018-11-08 MED ORDER — KCL IN DEXTROSE-NACL 20-5-0.9 MEQ/L-%-% IV SOLN
INTRAVENOUS | Status: DC
  Administered 2018-11-08: 14:00:00 via INTRAVENOUS
  Filled 2018-11-08 (×3): qty 1000

## 2018-11-08 MED ORDER — LETROZOLE 2.5 MG PO TABS
2.5000 mg | ORAL_TABLET | Freq: Every day | ORAL | Status: DC
  Administered 2018-11-08: 2.5 mg via ORAL
  Filled 2018-11-08 (×2): qty 1

## 2018-11-08 MED ORDER — DIPHENHYDRAMINE HCL 12.5 MG/5ML PO ELIX: 12.5000 mg | ORAL_SOLUTION | Freq: Four times a day (QID) | ORAL | Status: DC | PRN

## 2018-11-08 MED ORDER — TECHNETIUM TC 99M SULFUR COLLOID FILTERED
1.0000 | Freq: Once | INTRAVENOUS | Status: AC | PRN
  Administered 2018-11-08: 1 via INTRADERMAL

## 2018-11-08 MED ORDER — CELECOXIB 200 MG PO CAPS
400.0000 mg | ORAL_CAPSULE | ORAL | Status: AC
  Administered 2018-11-08: 400 mg via ORAL
  Filled 2018-11-08: qty 2

## 2018-11-08 MED ORDER — PRAMIPEXOLE DIHYDROCHLORIDE 1 MG PO TABS
1.0000 mg | ORAL_TABLET | Freq: Three times a day (TID) | ORAL | Status: DC
  Administered 2018-11-08 – 2018-11-09 (×3): 1 mg via ORAL
  Filled 2018-11-08 (×5): qty 1

## 2018-11-08 MED ORDER — BUPIVACAINE HCL (PF) 0.25 % IJ SOLN
INTRAMUSCULAR | Status: AC
  Filled 2018-11-08: qty 30

## 2018-11-08 MED ORDER — OXYCODONE HCL 5 MG PO TABS: 5.0000 mg | ORAL_TABLET | Freq: Once | ORAL | Status: DC | PRN

## 2018-11-08 MED ORDER — LIDOCAINE 2% (20 MG/ML) 5 ML SYRINGE
INTRAMUSCULAR | Status: DC | PRN
  Administered 2018-11-08: 100 mg via INTRAVENOUS

## 2018-11-08 MED ORDER — KETOROLAC TROMETHAMINE 15 MG/ML IJ SOLN
15.0000 mg | Freq: Once | INTRAMUSCULAR | Status: AC
  Administered 2018-11-08: 15 mg via INTRAVENOUS

## 2018-11-08 MED ORDER — SODIUM CHLORIDE (PF) 0.9 % IJ SOLN
INTRAMUSCULAR | Status: AC
  Filled 2018-11-08: qty 10

## 2018-11-08 MED ORDER — ESMOLOL HCL 100 MG/10ML IV SOLN
INTRAVENOUS | Status: AC
  Filled 2018-11-08: qty 10

## 2018-11-08 MED ORDER — DIPHENHYDRAMINE HCL 50 MG/ML IJ SOLN: 12.5000 mg | Freq: Four times a day (QID) | INTRAMUSCULAR | Status: DC | PRN

## 2018-11-08 MED ORDER — SERTRALINE HCL 50 MG PO TABS
50.0000 mg | ORAL_TABLET | Freq: Every day | ORAL | Status: DC
  Administered 2018-11-08: 50 mg via ORAL
  Filled 2018-11-08: qty 1

## 2018-11-08 MED ORDER — HYDROCHLOROTHIAZIDE 25 MG PO TABS
25.0000 mg | ORAL_TABLET | Freq: Every day | ORAL | Status: DC
  Administered 2018-11-08: 25 mg via ORAL
  Filled 2018-11-08 (×2): qty 1

## 2018-11-08 MED ORDER — LIDOCAINE 2% (20 MG/ML) 5 ML SYRINGE
INTRAMUSCULAR | Status: AC
  Filled 2018-11-08: qty 5

## 2018-11-08 MED ORDER — BUPIVACAINE-EPINEPHRINE (PF) 0.25% -1:200000 IJ SOLN
INTRAMUSCULAR | Status: DC | PRN
  Administered 2018-11-08 (×11): 5 mL via PERINEURAL

## 2018-11-08 MED ORDER — KETAMINE HCL 50 MG/5ML IJ SOSY
PREFILLED_SYRINGE | INTRAMUSCULAR | Status: AC
  Filled 2018-11-08: qty 5

## 2018-11-08 MED ORDER — ONDANSETRON 4 MG PO TBDP: 4.0000 mg | ORAL_TABLET | Freq: Four times a day (QID) | ORAL | Status: DC | PRN

## 2018-11-08 MED ORDER — ENOXAPARIN SODIUM 40 MG/0.4ML ~~LOC~~ SOLN
40.0000 mg | SUBCUTANEOUS | Status: DC
  Administered 2018-11-09: 40 mg via SUBCUTANEOUS
  Filled 2018-11-08: qty 0.4

## 2018-11-08 MED ORDER — LACTATED RINGERS IV SOLN
INTRAVENOUS | Status: DC | PRN
  Administered 2018-11-08: 07:00:00 via INTRAVENOUS

## 2018-11-08 MED ORDER — HYDRALAZINE HCL 20 MG/ML IJ SOLN: 10.0000 mg | INTRAMUSCULAR | Status: DC | PRN

## 2018-11-08 MED ORDER — MIDAZOLAM HCL 2 MG/2ML IJ SOLN
INTRAMUSCULAR | Status: AC
  Filled 2018-11-08: qty 2

## 2018-11-08 MED ORDER — DEXAMETHASONE SODIUM PHOSPHATE 10 MG/ML IJ SOLN
INTRAMUSCULAR | Status: DC | PRN
  Administered 2018-11-08: 4 mg via INTRAVENOUS

## 2018-11-08 MED ORDER — OXYCODONE HCL 5 MG/5ML PO SOLN: 5.0000 mg | Freq: Once | ORAL | Status: DC | PRN

## 2018-11-08 MED ORDER — ONDANSETRON HCL 4 MG/2ML IJ SOLN
INTRAMUSCULAR | Status: AC
  Filled 2018-11-08: qty 2

## 2018-11-08 MED ORDER — KETAMINE HCL 10 MG/ML IJ SOLN
INTRAMUSCULAR | Status: DC | PRN
  Administered 2018-11-08 (×2): 10 mg via INTRAVENOUS
  Administered 2018-11-08: 20 mg via INTRAVENOUS

## 2018-11-08 MED ORDER — OXYCODONE HCL 5 MG PO TABS
5.0000 mg | ORAL_TABLET | ORAL | Status: DC | PRN
  Filled 2018-11-08: qty 1

## 2018-11-08 MED ORDER — FENTANYL CITRATE (PF) 100 MCG/2ML IJ SOLN: 50.0000 ug | INTRAMUSCULAR | Status: DC | PRN

## 2018-11-08 MED ORDER — ONDANSETRON HCL 4 MG/2ML IJ SOLN: 4.0000 mg | Freq: Once | INTRAMUSCULAR | Status: DC | PRN

## 2018-11-08 MED ORDER — MEPERIDINE HCL 50 MG/ML IJ SOLN: 6.2500 mg | INTRAMUSCULAR | Status: DC | PRN

## 2018-11-08 MED ORDER — ACETAMINOPHEN 500 MG PO TABS
1000.0000 mg | ORAL_TABLET | ORAL | Status: AC
  Administered 2018-11-08: 1000 mg via ORAL
  Filled 2018-11-08: qty 2

## 2018-11-08 MED ORDER — FENTANYL CITRATE (PF) 100 MCG/2ML IJ SOLN: 25.0000 ug | INTRAMUSCULAR | Status: DC | PRN

## 2018-11-08 MED ORDER — FENTANYL CITRATE (PF) 250 MCG/5ML IJ SOLN
INTRAMUSCULAR | Status: AC
  Filled 2018-11-08: qty 5

## 2018-11-08 MED ORDER — CARBIDOPA-LEVODOPA 25-100 MG PO TABS
1.0000 | ORAL_TABLET | Freq: Two times a day (BID) | ORAL | Status: DC
  Administered 2018-11-08 – 2018-11-09 (×3): 1 via ORAL
  Filled 2018-11-08 (×3): qty 1

## 2018-11-08 SURGICAL SUPPLY — 44 items
ADH SKN CLS APL DERMABOND .7 (GAUZE/BANDAGES/DRESSINGS) ×1
APPLIER CLIP 9.375 MED OPEN (MISCELLANEOUS) ×3
APR CLP MED 9.3 20 MLT OPN (MISCELLANEOUS) ×1
BINDER BREAST LRG (GAUZE/BANDAGES/DRESSINGS) ×2 IMPLANT
BIOPATCH RED 1 DISK 7.0 (GAUZE/BANDAGES/DRESSINGS) ×1 IMPLANT
BIOPATCH RED 1IN DISK 7.0MM (GAUZE/BANDAGES/DRESSINGS) ×1
CANISTER SUCT 3000ML PPV (MISCELLANEOUS) ×3 IMPLANT
CHLORAPREP W/TINT 26ML (MISCELLANEOUS) ×3 IMPLANT
CLIP APPLIE 9.375 MED OPEN (MISCELLANEOUS) ×1 IMPLANT
COVER PROBE W GEL 5X96 (DRAPES) ×3 IMPLANT
COVER SURGICAL LIGHT HANDLE (MISCELLANEOUS) ×3 IMPLANT
COVER WAND RF STERILE (DRAPES) ×3 IMPLANT
DERMABOND ADVANCED (GAUZE/BANDAGES/DRESSINGS) ×2
DERMABOND ADVANCED .7 DNX12 (GAUZE/BANDAGES/DRESSINGS) ×1 IMPLANT
DRAIN CHANNEL 19F RND (DRAIN) ×3 IMPLANT
DRAPE CHEST BREAST 15X10 FENES (DRAPES) ×3 IMPLANT
DRSG PAD ABDOMINAL 8X10 ST (GAUZE/BANDAGES/DRESSINGS) ×2 IMPLANT
DRSG TEGADERM 4X4.75 (GAUZE/BANDAGES/DRESSINGS) ×2 IMPLANT
ELECT CAUTERY BLADE 6.4 (BLADE) ×3 IMPLANT
ELECT REM PT RETURN 9FT ADLT (ELECTROSURGICAL) ×3
ELECTRODE REM PT RTRN 9FT ADLT (ELECTROSURGICAL) ×1 IMPLANT
EVACUATOR SILICONE 100CC (DRAIN) ×3 IMPLANT
GLOVE BIO SURGEON STRL SZ8 (GLOVE) ×3 IMPLANT
GLOVE BIOGEL PI IND STRL 8 (GLOVE) ×1 IMPLANT
GLOVE BIOGEL PI INDICATOR 8 (GLOVE) ×2
GOWN STRL REUS W/ TWL LRG LVL3 (GOWN DISPOSABLE) ×3 IMPLANT
GOWN STRL REUS W/ TWL XL LVL3 (GOWN DISPOSABLE) ×1 IMPLANT
GOWN STRL REUS W/TWL LRG LVL3 (GOWN DISPOSABLE) ×9
GOWN STRL REUS W/TWL XL LVL3 (GOWN DISPOSABLE) ×3
KIT BASIN OR (CUSTOM PROCEDURE TRAY) ×3 IMPLANT
KIT TURNOVER KIT B (KITS) ×3 IMPLANT
MARKER SKIN DUAL TIP RULER LAB (MISCELLANEOUS) ×2 IMPLANT
NDL HYPO 25GX1X1/2 BEV (NEEDLE) IMPLANT
NEEDLE HYPO 25GX1X1/2 BEV (NEEDLE) IMPLANT
NS IRRIG 1000ML POUR BTL (IV SOLUTION) ×3 IMPLANT
PACK GENERAL/GYN (CUSTOM PROCEDURE TRAY) ×3 IMPLANT
PAD ARMBOARD 7.5X6 YLW CONV (MISCELLANEOUS) ×5 IMPLANT
PENCIL SMOKE EVACUATOR (MISCELLANEOUS) ×3 IMPLANT
SPECIMEN JAR X LARGE (MISCELLANEOUS) ×3 IMPLANT
SUT ETHILON 3 0 FSL (SUTURE) ×3 IMPLANT
SUT MNCRL AB 4-0 PS2 18 (SUTURE) ×3 IMPLANT
SUT VIC AB 3-0 SH 18 (SUTURE) ×3 IMPLANT
SYR CONTROL 10ML LL (SYRINGE) IMPLANT
TOWEL GREEN STERILE (TOWEL DISPOSABLE) ×2 IMPLANT

## 2018-11-08 NOTE — Anesthesia Procedure Notes (Signed)
Anesthesia Regional Block: Pectoralis block   Pre-Anesthetic Checklist: ,, timeout performed, Correct Patient, Correct Site, Correct Laterality, Correct Procedure, Correct Position, site marked, Risks and benefits discussed,  Surgical consent,  Pre-op evaluation,  At surgeon's request and post-op pain management  Laterality: Upper and Left  Prep: chloraprep       Needles:  Injection technique: Single-shot  Needle Type: Echogenic Stimulator Needle     Needle Length: 9cm  Needle Gauge: 21   Needle insertion depth: 2 cm   Additional Needles:   Procedures:,,,, ultrasound used (permanent image in chart),,,,  Narrative:  Start time: 11/08/2018 7:15 AM End time: 11/08/2018 7:30 AM Injection made incrementally with aspirations every 5 mL.  Performed by: Personally  Anesthesiologist: Lyn Hollingshead, MD

## 2018-11-08 NOTE — Interval H&P Note (Signed)
History and Physical Interval Note:  11/08/2018 7:06 AM  Amanda [name]  has presented today for surgery, with the diagnosis of LEFT BREAST CANCER  The various methods of treatment have been discussed with the patient and family. After consideration of risks, benefits and other options for treatment, the patient has consented to  Procedure(s): LEFT SIMPLE MASTECTOMY WITH LEFT  AXILLARY [place] (Left) as a surgical intervention .  The patient's history has been reviewed, patient examined, no change in status, stable for surgery.  I have reviewed the patient's chart and labs.  Questions were answered to the patient's satisfaction.     [place]

## 2018-11-08 NOTE — Progress Notes (Signed)
Pt new admit from PACU s/p left simple mastectomy with left axillary sentinel node mapping, alert and oriented with left breast skin glued, dry and intact, with breast binder, left JP drain, husband at the bedside given health teachings on how to empty the JP drain, pt able to void in the bathroom, no complain of pain at this time.

## 2018-11-08 NOTE — Care Management (Signed)
Received a call from Raven with [facility].She has received a referral from Dr Brantley Stage for orders to monitor incision, monitor for complications . Jonelle Sidle has spoke with patient.  Magdalen Spatz RN BSN [phone]

## 2018-11-08 NOTE — Anesthesia Procedure Notes (Signed)
Procedure Name: LMA Insertion °Performed by: Sung Renton H, CRNA °Pre-anesthesia Checklist: Patient identified, Emergency Drugs available, Suction available and Patient being monitored °Patient Re-evaluated:Patient Re-evaluated prior to induction °Oxygen Delivery Method: Circle System Utilized °Preoxygenation: Pre-oxygenation with 100% oxygen °Induction Type: IV induction °Ventilation: Mask ventilation without difficulty °LMA: LMA inserted °LMA Size: 4.0 °Number of attempts: 1 °Airway Equipment and Method: Bite block °Placement Confirmation: positive ETCO2 °Tube secured with: Tape °Dental Injury: Teeth and Oropharynx as per pre-operative assessment  ° ° ° ° ° ° °

## 2018-11-08 NOTE — Op Note (Signed)
Preoperative diagnosis: Stage II left breast cancer  Postoperative diagnosis: Same  Procedure: Left simple mastectomy with left axillary sentinel lymph node mapping of deep level 1 left axillary lymph node  Surgeon: Erroll Luna, MD  Assistant: Judyann Munson, RNFA  Drains: 19 round drain to mastectomy wound  IV fluids: Per anesthesia record  EBL: 20 cc  Anesthesia: LMA with pectoral block  Indications for procedure: The patient is a 73 year old female who was found of a large left breast cancer in November 2018.  She was managed with antiestrogen therapy due to its lobular phenotype.  She has been followed but has had minimal reduction of her disease.  She has had no progression but after her being on antiestrogens for almost a year with no improvement, we opted for mastectomy at this point time since we felt we maximized her medical management.The surgical and non surgical options have been discussed with the patient.  Risks of surgery include bleeding,  Infection,  Flap necrosis,  Tissue loss,  Chronic pain, death, Numbness,  And the need for additional procedures.  Reconstruction options also have been discussed with the patient as well.  The patient agrees to proceed.   Description of procedure: The patient was met in the holding area.  Left side was marked as the correct side and questions were answered.  She underwent injection of the left breast with technetium sulfur colloid.  She was taken back the operating.  She is placed supine.  Of note she had a pectoral block placed in the holding area.  After induction of general anesthesia, left breast was prepped and draped in sterile fashion timeout was done.  She received appropriate antibiotics.  Curvilinear incision was made above and below the nipple areolar complex.  Superior skin flap was taken to the clavicle and the inferior skin flap was taken down to the inframammary fold.  We then dissected the breast tissue off the chest wall  in a medial to lateral fashion to include the pectoralis fascia.  The breast was amputated from its lateral attachments and passed off the field and oriented and sent on to pathology.  Neoprobe was used.  A hot sentinel nodes identified to level 1 in the deep left axilla and this was excised.  Background counts approaches 0.  Hemostasis achieved with cautery.  Through a separate stab incision a 19 round drain was placed and secured the skin with 2-0 nylon.  Irrigation was used.  There again was no bleeding.  We then closed the wound with a deep layer 3-0 Vicryl and 4-0 Monocryl for the skin.  Dermabond applied.  Breast binder placed in bulb placed to suction.  All final counts were found to be correct.  Breast binder placed.  The patient was awoke extubated taken to recovery in satisfactory condition.

## 2018-11-08 NOTE — Anesthesia Postprocedure Evaluation (Signed)
Anesthesia Post Note  Patient: Amanda [name]  Procedure(s) Performed: LEFT SIMPLE MASTECTOMY WITH LEFT  AXILLARY SENTINEL NODE MAPPING (Left Breast)     Patient location during evaluation: PACU Anesthesia Type: General Level of consciousness: awake and sedated Pain management: pain level controlled Vital Signs Assessment: post-procedure vital signs reviewed and stable Respiratory status: spontaneous breathing Cardiovascular status: stable Postop Assessment: no apparent nausea or vomiting Anesthetic complications: no    Last Vitals:  Vitals:   11/08/18 0958 11/08/18 1000  BP: (!) 106/59   Pulse: 71 69  Resp: 13 12  Temp:    SpO2: 99% 98%    Last Pain:  Vitals:   11/08/18 0945  TempSrc:   PainSc: Asleep   Pain Goal: Patients Stated Pain Goal: 0 (11/08/18 0648)                 Huston Foley

## 2018-11-08 NOTE — H&P (Signed)
Amanda [name]  Location: [facility] Surgery Patient #: [id number] DOB: [birth year] Married / Language: English / Race: White Female   History of Present Illness  Patient words: The patient returns for follow-up after neoadjuvant chemotherapy for T3 N0 MX left breast cancer. She was seen in January. She opted for neoadjuvant anti-hormonal therapy. Her  recent MRI shows minimal change to a 5 cm left breast cancer in the lower outer quadrant. Otherwise, she is in good spirits. She does have Parkinson's disease and this is remained stable. He is here today to talk about surgical options and potential treatment options. She is accompanied by her husband.   She saw plastic surgery but they did not feel reconstruction appropriate in this setting given possible radiation issues and advanced age.  The patient is a 73 year old female.   Allergies  Penicillins  Anaphylaxis. Sulfa Drugs  Dermatitis, Diarrhea. Allergies Reconciled   Medication History  Carbidopa-Levodopa (25-100MG  Tablet, Oral) Active. Pramipexole Dihydrochloride (1MG  Tablet, Oral) Active. Letrozole (2.5MG  Tablet, Oral) Active. Sertraline HCl (50MG  Tablet, Oral) Active. B Complex (Oral) Active. (mega food) DHEA (Oral) Specific strength unknown - Active. Biest/Progesterone (Transdermal) Active. brompton (Raw Calcium) Active. Probiotic (Oral) Active. Vitamin D3 (1000UNIT/SPRAY Liquid, Oral) Active. Medications Reconciled  Vitals  10/01/2018 2:18 PM Weight: 129.13 lb Height: 64.5in Body Surface Area: 1.63 m Body Mass Index: 21.82 kg/m  Temp.: 97.3F(Temporal)  Pulse: 99 (Regular)  BP: 114/68 (Sitting, Right Arm, Standard)       Physical Exam  General Mental Status-Alert. General Appearance-Consistent with stated age. Hydration-Well hydrated. Voice-Normal.  Head and Neck Head-normocephalic, atraumatic with no lesions or palpable  masses.  Breast Note: Left breast mass stable 3.4 cm by examination.   Neurologic Neurologic evaluation reveals -alert and oriented x 3 with no impairment of recent or remote memory. Mental Status-Normal.    Assessment & Plan  BREAST CANCER (C50.919) Impression: Left breast lobular phenotype  Not felt to be a good reconstruction candidate for now. She may require radiation therapy  Proceed with left simple mastectomy with sentinel lymph node mapping. Risk of sentinel lymph node mapping include bleeding, infection, lymphedema, shoulder pain. stiffness, dye allergy. cosmetic deformity , blood clots, death, need for more surgery. Pt agres to proceed. Discussed treatment options for breast cancer to include breast conservation vs mastectomy with reconstruction. Pt has decided on mastectomy. Risk include bleeding, infection, flap necrosis, pain, numbness, recurrence, hematoma, other surgery needs. Pt understands and agrees to proceed.    Patient is a minimal change in size. She has many questions and examination breast reconstruction. I will refer her to plastic surgery for evaluation. She will continue on her antirejection therapy. I'll see her back next month to finalize her surgical planning but may wish to proceed with left breast mastectomy given the minimal change in size and the possibility of needing radiation therapy after her mastectomy. Current Plans You are being scheduled for surgery- Our schedulers will call you.  You should hear from our office's scheduling department within 5 working days about the location, date, and time of surgery. We try to make accommodations for patient's preferences in scheduling surgery, but sometimes the OR schedule or the surgeon's schedule prevents [place] from making those accommodations.  If you have not heard from our office [phone]) in 5 working days, call the office and ask for your surgeon's nurse.  If you have other  questions about your diagnosis, plan, or surgery, call the office and ask for your surgeon's nurse.  We discussed  the staging and pathophysiology of breast cancer. We discussed all of the different options for treatment for breast cancer including surgery, chemotherapy, radiation therapy, Herceptin, and antiestrogen therapy. We discussed a sentinel lymph node biopsy as she does not appear to having lymph node involvement right now. We discussed the performance of that with injection of radioactive tracer and blue dye. We discussed that she would have an incision underneath her axillary hairline. We discussed that there is a bout a 10-20% chance of having a positive node with a sentinel lymph node biopsy and we will await the permanent pathology to make any other first further decisions in terms of her treatment. One of these options might be to return to the operating room to perform an axillary lymph node dissection. We discussed about a 1-2% risk lifetime of chronic shoulder pain as well as lymphedema associated with a sentinel lymph node biopsy. We discussed the options for treatment of the breast cancer which included lumpectomy versus a mastectomy. We discussed the performance of the lumpectomy with a wire placement. We discussed a 10-20% chance of a positive margin requiring reexcision in the operating room. We also discussed that she may need radiation therapy or antiestrogen therapy or both if she undergoes lumpectomy. We discussed the mastectomy and the postoperative care for that as well. We discussed that there is no difference in her survival whether she undergoes lumpectomy with radiation therapy or antiestrogen therapy versus a mastectomy. There is a slight difference in the local recurrence rate being 3-5% with lumpectomy and about 1% with a mastectomy. We discussed the risks of operation including bleeding, infection, possible reoperation. She understands her further therapy will be based on  what her stages at the time of her operation.  Pt Education - Pamphlet Given - Breast Biopsy: discussed with patient and provided information. Pt Education - [facility] Mastectomy HCI

## 2018-11-08 NOTE — Transfer of Care (Signed)
Immediate Anesthesia Transfer of Care Note  Patient: Amanda [name]  Procedure(s) Performed: LEFT SIMPLE MASTECTOMY WITH LEFT  AXILLARY SENTINEL NODE MAPPING (Left Breast)  Patient Location: PACU  Anesthesia Type:GA combined with regional for post-op pain  Level of Consciousness: drowsy  Airway & Oxygen Therapy: Patient Spontanous Breathing and Patient connected to face mask oxygen  Post-op Assessment: Report given to RN and Post -op Vital signs reviewed and stable  Post vital signs: Reviewed and stable  Last Vitals:  Vitals Value Taken Time  BP 119/56 11/08/2018  9:13 AM  Temp    Pulse 68 11/08/2018  9:14 AM  Resp 14 11/08/2018  9:14 AM  SpO2 100 % 11/08/2018  9:14 AM  Vitals shown include unvalidated device data.  Last Pain:  Vitals:   11/08/18 0648  TempSrc:   PainSc: 0-No pain      Patients Stated Pain Goal: 0 (64/15/83 0940)  Complications: No apparent anesthesia complications

## 2018-11-09 ENCOUNTER — Encounter (HOSPITAL_COMMUNITY): Payer: Self-pay | Admitting: Surgery

## 2018-11-09 DIAGNOSIS — Z79899 Other long term (current) drug therapy: Secondary | ICD-10-CM | POA: Diagnosis not present

## 2018-11-09 DIAGNOSIS — Z170 Estrogen receptor positive status [ER+]: Secondary | ICD-10-CM | POA: Diagnosis not present

## 2018-11-09 DIAGNOSIS — C50512 Malignant neoplasm of lower-outer quadrant of left female breast: Secondary | ICD-10-CM | POA: Diagnosis not present

## 2018-11-09 DIAGNOSIS — F419 Anxiety disorder, unspecified: Secondary | ICD-10-CM | POA: Diagnosis not present

## 2018-11-09 DIAGNOSIS — Z9221 Personal history of antineoplastic chemotherapy: Secondary | ICD-10-CM | POA: Diagnosis not present

## 2018-11-09 DIAGNOSIS — G20 Parkinson's disease: Secondary | ICD-10-CM | POA: Diagnosis not present

## 2018-11-09 MED ORDER — TRAMADOL HCL 50 MG PO TABS: 50.0000 mg | ORAL_TABLET | Freq: Four times a day (QID) | ORAL | 0 refills | Status: DC | PRN

## 2018-11-09 NOTE — Progress Notes (Signed)
Pt discharged via wheelchair assisted by Nurse Tech.

## 2018-11-09 NOTE — Discharge Summary (Signed)
Physician Discharge Summary  Patient ID: Amanda [name] MRN: [id number] DOB/AGE: [birth year] 72 y.o.  Admit date: 11/08/2018 Discharge date: 11/09/2018  Admission Diagnoses: Stage II breast cancer  Patient Active Problem List   Diagnosis Date Noted  . Breast cancer, stage 2, left ([place]) 11/08/2018  . Flexural eczema 05/13/2018  . Malignant neoplasm of lower-outer quadrant of left breast of female, estrogen receptor positive ([place]) 08/14/2017  . Edema, lower extremity 05/02/2017  . Insect sting 05/09/2016  . Prolapse of female pelvic organs 01/11/2016  . Biceps tendinitis on left 02/18/2014  . Parkinson's disease ([place]) 10/03/2012  . Sinusitis 11/09/2011  . ANXIETY 06/03/2009  . OVARIAN CYST 06/02/2008  . OSTEOPENIA 06/02/2008  . FATIGUE 06/02/2008    Discharge Diagnoses:  Active Problems:   Breast cancer, stage 2, left ([place])  Same as above Discharged Condition: good  Hospital Course: Patient underwent left simple mastectomy with sentinel lymph node mapping for stage II left breast cancer after neoadjuvant endocrine therapy.  Her postop course was unremarkable.  She was ambulating and was able to tolerate her diet with good pain control.  Her wound was clean dry and intact and the drainage was serosanguineous.  She was able to ambulate without difficulty.  She was discharged home in good condition.  Consults: None    Treatments: surgery: Left simple mastectomy with sentinel lymph node mapping  Discharge Exam: Blood pressure (!) 108/50, pulse 62, temperature 98.1 F (36.7 C), temperature source Oral, resp. rate 16, height 5' 4.5" (1.638 m), weight 58.2 kg, SpO2 95 %. General appearance: alert and cooperative Head: Normocephalic, without obvious abnormality, atraumatic Resp: clear to auscultation bilaterally Cardio: regular rate and rhythm, S1, S2 normal, no murmur, click, rub or gallop Incision/Wound: Mastectomy and wound clean dry and intact.  No hematoma.  Flaps are viable.   Serous drainage from drain  Disposition:    Allergies as of 11/09/2018      Reactions   Penicillins    Leg pain Did it involve swelling of the face/tongue/throat, SOB, or low BP? No Did it involve sudden or severe rash/hives, skin peeling, or any reaction on the inside of your mouth or nose? No Did you need to seek medical attention at a hospital or doctor's office? No When did it last happen?childhood allergy If all above answers are "NO", may proceed with cephalosporin use.      Medication List    TAKE these medications   AMBULATORY NON FORMULARY MEDICATION Medication Name: Compression Stockings 20-92mmHg  1 pair   aspirin EC 325 MG tablet Take 325 mg by mouth daily as needed for moderate pain.   b complex vitamins tablet Take 1 tablet by mouth daily. MEGA FOOD BALANCE SOURCE   CALCIUM-MAGNESIUM-VITAMIN D PO Take 2 tablets by mouth 2 (two) times daily.   carbidopa-levodopa 25-100 MG tablet Commonly known as:  SINEMET IR Take 1 tablet by mouth 2 (two) times daily.   COLD-EEZE 13.3 MG Lozg Generic drug:  Zinc Gluconate Use as directed 13.3 mg in the mouth or throat daily as needed (cold symptoms).   hydrochlorothiazide 25 MG tablet Commonly known as:  HYDRODIURIL TAKE 1 TABLET BY MOUTH EVERY DAY   letrozole 2.5 MG tablet Commonly known as:  FEMARA TAKE 1 TABLET BY MOUTH EVERY DAY What changed:  when to take this   pramipexole 1 MG tablet Commonly known as:  MIRAPEX Take 1 tablet by mouth 3 (three) times daily.   PROBIOTIC DAILY Caps Take 1 capsule by mouth daily  as needed (indigestion).   sertraline 50 MG tablet Commonly known as:  ZOLOFT Take 50 mg by mouth at bedtime.   SOOTHE OP Place 2 drops into both eyes daily as needed (dry eyes).   traMADol 50 MG tablet Commonly known as:  ULTRAM Take 1 tablet (50 mg total) by mouth every 6 (six) hours as needed (mild pain).   triamcinolone cream 0.1 % Commonly known as:  KENALOG Apply 1 application  topically 2 (two) times daily.   Vitamin D3 1000 UNIT/SPRAY Liqd Take 2 sprays by mouth 2 (two) times daily.        Signed: Joyice Faster Chameka Mcmullen 11/09/2018, 8:43 AM

## 2018-11-09 NOTE — Progress Notes (Signed)
Pt ifor discharge going home, her husband at the bedside, discontinue peripheral IV line, give health instruction on how to empty the JP drain and how to record it, wound site dry and intact, given all her personal belongings, discontinued the peripheral IV line, given health teachings and next appointment, due med explained and understood, no complain of pain at this time, paged Dr. Brantley Stage for discharge order.

## 2018-11-09 NOTE — Discharge Instructions (Signed)
[facility] °[phone] ° °MASTECTOMY: POST OP INSTRUCTIONS ° °Always review your discharge instruction sheet given to you by the facility where your surgery was performed. °IF YOU HAVE DISABILITY OR FAMILY LEAVE FORMS, YOU MUST BRING THEM TO THE OFFICE FOR PROCESSING.   °DO NOT GIVE THEM TO YOUR DOCTOR. °A prescription for pain medication may be given to you upon discharge.  Take your pain medication as prescribed, if needed.  If narcotic pain medicine is not needed, then you may take acetaminophen (Tylenol) or ibuprofen (Advil) as needed. °1. Take your usually prescribed medications unless otherwise directed. °2. If you need a refill on your pain medication, please contact your pharmacy.  They will contact our office to request authorization.  Prescriptions will not be filled after 5pm or on week-ends. °3. You should follow a light diet the first few days after arrival home, such as soup and crackers, etc.  Resume your normal diet the day after surgery. °4. Most patients will experience some swelling and bruising on the chest and underarm.  Ice packs will help.  Swelling and bruising can take several days to resolve.  °5. It is common to experience some constipation if taking pain medication after surgery.  Increasing fluid intake and taking a stool softener (such as Colace) will usually help or prevent this problem from occurring.  A mild laxative (Milk of Magnesia or Miralax) should be taken according to package instructions if there are no bowel movements after 48 hours. °6. Unless discharge instructions indicate otherwise, leave your bandage dry and in place until your next appointment in 3-5 days.  You may take a limited sponge bath.  No tube baths or showers until the drains are removed.  You may have steri-strips (small skin tapes) in place directly over the incision.  These strips should be left on the skin for 7-10 days.  If your surgeon used skin glue on the incision, you may  shower in 24 hours.  The glue will flake off over the next 2-3 weeks.  Any sutures or staples will be removed at the office during your follow-up visit. °7. DRAINS:  If you have drains in place, it is important to keep a list of the amount of drainage produced each day in your drains.  Before leaving the hospital, you should be instructed on drain care.  Call our office if you have any questions about your drains. °8. ACTIVITIES:  You may resume regular (light) daily activities beginning the next day--such as daily self-care, walking, climbing stairs--gradually increasing activities as tolerated.  You may have sexual intercourse when it is comfortable.  Refrain from any heavy lifting or straining until approved by your doctor. °a. You may drive when you are no longer taking prescription pain medication, you can comfortably wear a seatbelt, and you can safely maneuver your car and apply brakes. °b. RETURN TO WORK:  __________________________________________________________ °9. You should see your doctor in the office for a follow-up appointment approximately 3-5 days after your surgery.  Your doctor’s nurse will typically make your follow-up appointment when she calls you with your pathology report.  Expect your pathology report 2-3 business days after your surgery.  You may call to check if you do not hear from us after three days.   °10. OTHER INSTRUCTIONS: ______________________________________________________________________________________________ ____________________________________________________________________________________________ °WHEN TO CALL YOUR DOCTOR: °1. Fever over 101.0 °2. Nausea and/or vomiting °3. Extreme swelling or bruising °4. Continued bleeding from incision. °5. Increased pain, redness, or drainage from the incision. °  The clinic staff is available to answer your questions during regular business hours.  Please don’t hesitate to call and ask to speak to one of the nurses for clinical  concerns.  If you have a medical emergency, go to the nearest emergency room or call 911.  A surgeon from [facility] is always on call at the hospital. °[address], [address], [place], [place]  [address] ? [address] [address], [place], [place]   [address] °[phone] ? [phone] ? FAX [phone] °Web site: www.cent °[facility] °Surgical drains are used to remove extra fluid that normally builds up in a surgical wound after surgery. A surgical drain helps to heal a surgical wound. Different kinds of surgical drains include: °· Active drains. These drains use suction to pull drainage away from the surgical wound. Drainage flows through a tube to a container outside of the body. It is important to keep the bulb or the drainage container flat (compressed) at all times, except while you empty it. Flattening the bulb or container creates suction. The two most common types of active drains are bulb drains and Hemovac drains. °· Passive drains. These drains allow fluid to drain naturally, by gravity. Drainage flows through a tube to a bandage (dressing) or a container outside of the body. Passive drains do not need to be emptied. The most common type of passive drain is the Penrose drain. °A drain is placed during surgery. Immediately after surgery, drainage is usually bright red and a little thicker than water. The drainage may gradually turn yellow or pink and become thinner. It is likely that your health care provider will remove the drain when the drainage stops or when the amount decreases to 1-2 Tbsp (15-30 mL) during a 24-hour period. °How to care for your surgical drain °It is important to care for your drain to prevent infection. If your drain is placed at your back, or any other hard-to-reach area, ask another person to assist you in performing the following steps: °· Keep the skin around the drain dry and covered with a dressing at all times. °· Check your drain area every  day for signs of infection. Check for: °? More redness, swelling, or pain. °? Pus or a bad smell. °? Cloudy drainage. °Follow instructions from your health care provider about how to take care of your drain and how to change your dressing. Change your dressing at least one time every day. Change it more often if needed to keep the dressing dry. Make sure you: °1. Gather your supplies, including: °? Tape. °? Germ-free cleaning solution (sterile saline). °? Split gauze drain sponge: 4 x 4 inches (10 x 10 cm). °? Gauze square: 4 x 4 inches (10 x 10 cm). °2. Wash your hands with soap and water before you change your dressing. If soap and water are not available, use hand sanitizer. °3. Remove the old dressing. Avoid using scissors to do that. °4. Use sterile saline to clean your skin around the drain. °5. Place the tube through the slit in a drain sponge. Place the drain sponge so that it covers your wound. °6. Place the gauze square or another drain sponge on top of the drain sponge that is on the wound. Make sure the tube is between those layers. °7. Tape the dressing to your skin. °8. If you have an active bulb or Hemovac drain, tape the drainage tube to your skin 1-2 inches (2.5-5 cm) below the place where   the tube enters your body. Taping keeps the tube from pulling on any stitches (sutures) that you have. °9. Wash your hands with soap and water. °10. Write down the color of your drainage and how often you change your dressing. °How to empty your active bulb or Hemovac drain ° °1. Make sure that you have a measuring cup that you can empty your drainage into. °2. Wash your hands with soap and water. If soap and water are not available, use hand sanitizer. °3. Gently move your fingers down the tube while squeezing very lightly. This is called stripping the tube. This clears any drainage, clots, or tissue from the tube. °? Do not pull on the tube. °? You may need to strip the tube several times every day to keep the  tube clear. °4. Open the bulb cap or the drain plug. Do not touch the inside of the cap or the bottom of the plug. °5. Empty all of the drainage into the measuring cup. °6. Compress the bulb or the container and replace the cap or the plug. To compress the bulb or the container, squeeze it firmly in the middle while you close the cap or plug the container. °7. Write down the amount of drainage that you have in each 24-hour period. If you have less than 2 Tbsp (30 mL) of drainage during 24 hours, contact your health care provider. °8. Flush the drainage down the toilet. °9. Wash your hands with soap and water. °Contact a health care provider if: °· You have more redness, swelling, or pain around your drain area. °· The amount of drainage that you have is increasing instead of decreasing. °· You have pus or a bad smell coming from your drain area. °· You have a fever. °· You have drainage that is cloudy. °· There is a sudden stop or a sudden decrease in the amount of drainage that you have. °· Your tube falls out. °· Your active drain does not stay compressed after you empty it. °Summary °· Surgical drains are used to remove extra fluid that normally builds up in a surgical wound after surgery. °· Different kinds of surgical drains include active drains and passive drains. Active drains use suction to pull drainage away from the surgical wound, and passive drains allow fluid to drain naturally. °· It is important to care for your drain to prevent infection. If your drain is placed at your back, or any other hard-to-reach area, ask another person to assist you. °· Contact your health care provider if you have more redness, swelling, or pain around your drain area. °This information is not intended to replace advice given to you by your health care provider. Make sure you discuss any questions you have with your health care provider. °Document Released: 09/02/2000 Document Revised: 09/28/2017 Document Reviewed:  03/25/2015 °[employer]vier Interactive Patient Education © 2019 [employer] ° °

## 2018-11-10 DIAGNOSIS — Z483 Aftercare following surgery for neoplasm: Secondary | ICD-10-CM | POA: Diagnosis not present

## 2018-11-10 DIAGNOSIS — Z9012 Acquired absence of left breast and nipple: Secondary | ICD-10-CM | POA: Diagnosis not present

## 2018-11-10 DIAGNOSIS — F419 Anxiety disorder, unspecified: Secondary | ICD-10-CM | POA: Diagnosis not present

## 2018-11-10 DIAGNOSIS — N819 Female genital prolapse, unspecified: Secondary | ICD-10-CM | POA: Diagnosis not present

## 2018-11-10 DIAGNOSIS — R2689 Other abnormalities of gait and mobility: Secondary | ICD-10-CM | POA: Diagnosis not present

## 2018-11-10 DIAGNOSIS — Z171 Estrogen receptor negative status [ER-]: Secondary | ICD-10-CM | POA: Diagnosis not present

## 2018-11-10 DIAGNOSIS — G20 Parkinson's disease: Secondary | ICD-10-CM | POA: Diagnosis not present

## 2018-11-10 DIAGNOSIS — Z4801 Encounter for change or removal of surgical wound dressing: Secondary | ICD-10-CM | POA: Diagnosis not present

## 2018-11-10 DIAGNOSIS — C50512 Malignant neoplasm of lower-outer quadrant of left female breast: Secondary | ICD-10-CM | POA: Diagnosis not present

## 2018-11-10 DIAGNOSIS — M6281 Muscle weakness (generalized): Secondary | ICD-10-CM | POA: Diagnosis not present

## 2018-11-13 DIAGNOSIS — C50512 Malignant neoplasm of lower-outer quadrant of left female breast: Secondary | ICD-10-CM | POA: Diagnosis not present

## 2018-11-13 DIAGNOSIS — G20 Parkinson's disease: Secondary | ICD-10-CM | POA: Diagnosis not present

## 2018-11-13 DIAGNOSIS — Z4801 Encounter for change or removal of surgical wound dressing: Secondary | ICD-10-CM | POA: Diagnosis not present

## 2018-11-13 DIAGNOSIS — Z483 Aftercare following surgery for neoplasm: Secondary | ICD-10-CM | POA: Diagnosis not present

## 2018-11-13 DIAGNOSIS — Z171 Estrogen receptor negative status [ER-]: Secondary | ICD-10-CM | POA: Diagnosis not present

## 2018-11-13 DIAGNOSIS — F419 Anxiety disorder, unspecified: Secondary | ICD-10-CM | POA: Diagnosis not present

## 2018-11-15 NOTE — Progress Notes (Signed)
Patient Care Team: Carollee Herter, Alferd Apa, DO as PCP - General Star Age, MD as Consulting Physician (Neurology) Dian Queen, MD as Consulting Physician (Obstetrics and Gynecology) Lyndal Pulley, DO as Consulting Physician (Family Medicine) Eldridge Abrahams, MD as Consulting Physician (Neurology) Carolan Clines, MD (Inactive) as Consulting Physician (Urology) Jola Schmidt, MD as Consulting Physician (Ophthalmology)  DIAGNOSIS:    ICD-10-CM   1. Malignant neoplasm of lower-outer quadrant of left breast of female, estrogen receptor positive ([place]) C50.512    Z17.0     SUMMARY OF ONCOLOGIC HISTORY:   Malignant neoplasm of lower-outer quadrant of left breast of female, estrogen receptor positive ([place])   07/21/2017 Initial Diagnosis    Left breast lower outer quadrant 5.4 x 2.3 x 3.8 cm mass along with additional satellite nodules measuring 6 mm and several other small satellite nodules, no lymphadenopathy, biopsy revealed invasive lobular cancer with LCIS grade 2, ER 95%, PR 60%, Ki-67 10%, HER-2 negative ratio 1.11, T3 N0 stage II a clinical stage AJCC 8    08/21/2017 Oncotype testing    Oncotype Dx 10: ROR 7%    08/21/2017 -  Anti-estrogen oral therapy    Neoadjuvant letrozole    11/08/2018 Surgery    Left mastectomy: 5 cm invasive lobular cancer with 40% cellularity, grade 2, involves anterior margin which is skin, no LVI or PNI, 0/2 lymph nodes negative, ER 100%, PR 0%, Ki-67 5%, HER-2 -1+ by IHC, T2 N0    11/20/2018 Cancer Staging    Staging form: Breast, AJCC 8th Edition - Pathologic stage from 11/20/2018: No Stage Recommended (ypT2, pN0, cM0, G2, ER+, PR-, HER2-, Oncotype DX score: 10) - Signed by Nicholas Lose, MD on 11/20/2018     CHIEF COMPLIANT: Follow-up s/p mastectomy to review pathology and discuss further treatment  INTERVAL HISTORY: Amanda [name] is a 73 y.o. with above-mentioned history of left breast cancer who took 1 year of neoadjuvant antiestrogen  therapy with letrozole without interval response and subsequently underwent a left mastectomy by Dr. Brantley Stage on 11/08/18. Pathology showed the cancer to be grade 2 invasive lobular carcinoma measuring 5cm with 40% cellularity, HER2 negative, ER 100%, PR negative, Ki67 5% with no lymph node involvement. She presents to the clinic today with her husband. She is recovering from surgery and notes pain under her arm and in her left chest area. She is concerned about the possible side effects of radiation therapy. She reviewed her medication list with me.   REVIEW OF SYSTEMS:   Constitutional: Denies fevers, chills or abnormal weight loss Eyes: Denies blurriness of vision Ears, nose, mouth, throat, and face: Denies mucositis or sore throat Respiratory: Denies cough, dyspnea or wheezes Cardiovascular: Denies palpitation, chest discomfort Gastrointestinal: Denies nausea, heartburn or change in bowel habits Skin: Denies abnormal skin rashes  Lymphatics: Denies new lymphadenopathy or easy bruising Neurological: Denies numbness, tingling or new weaknesses Behavioral/Psych: Mood is stable, no new changes  Extremities: No lower extremity edema Breast: denies any lumps or nodules in either breasts (+) pain at left mastectomy site All other systems were reviewed with the patient and are negative.  I have reviewed the past medical history, past surgical history, social history and family history with the patient and they are unchanged from previous note.  ALLERGIES:  is allergic to penicillins.  MEDICATIONS:  Current Outpatient Medications  Medication Sig Dispense Refill  . AMBULATORY NON FORMULARY MEDICATION Medication Name: Compression Stockings 20-73mHg  1 pair 1 each 0  . aspirin EC 325 MG  tablet Take 325 mg by mouth daily as needed for moderate pain.    Marland Kitchen b complex vitamins tablet Take 1 tablet by mouth daily. MEGA FOOD BALANCE SOURCE    . CALCIUM-MAGNESIUM-VITAMIN D PO Take 2 tablets by mouth 2  (two) times daily.    . carbidopa-levodopa (SINEMET IR) 25-100 MG tablet Take 1 tablet by mouth 2 (two) times daily.    . Cholecalciferol (VITAMIN D3) 1000 UNIT/SPRAY LIQD Take 2 sprays by mouth 2 (two) times daily.     Marland Kitchen letrozole (FEMARA) 2.5 MG tablet TAKE 1 TABLET BY MOUTH EVERY DAY (Patient taking differently: Take 2.5 mg by mouth at bedtime. ) 90 tablet 3  . pramipexole (MIRAPEX) 1 MG tablet Take 1 tablet by mouth 3 (three) times daily.  3  . Probiotic Product (PROBIOTIC DAILY) CAPS Take 1 capsule by mouth daily as needed (indigestion).     . Propylene Glycol-Glycerin (SOOTHE OP) Place 2 drops into both eyes daily as needed (dry eyes).    . sertraline (ZOLOFT) 50 MG tablet Take 50 mg by mouth at bedtime.     . Zinc Gluconate (COLD-EEZE) 13.3 MG LOZG Use as directed 13.3 mg in the mouth or throat daily as needed (cold symptoms).     No current facility-administered medications for this visit.     PHYSICAL EXAMINATION: ECOG PERFORMANCE STATUS: 1 - Symptomatic but completely ambulatory  Vitals:   11/20/18 1145  BP: (!) 120/56  Pulse: 71  Resp: 17  Temp: 98 F (36.7 C)  SpO2: 100%   Filed Weights   11/20/18 1145  Weight: 129 lb 14.4 oz (58.9 kg)    GENERAL: alert, no distress and comfortable SKIN: skin color, texture, turgor are normal, no rashes or significant lesions EYES: normal, Conjunctiva are pink and non-injected, sclera clear OROPHARYNX: no exudate, no erythema and lips, buccal mucosa, and tongue normal  NECK: supple, thyroid normal size, non-tender, without nodularity LYMPH: no palpable lymphadenopathy in the cervical, axillary or inguinal LUNGS: clear to auscultation and percussion with normal breathing effort HEART: regular rate & rhythm and no murmurs and no lower extremity edema ABDOMEN: abdomen soft, non-tender and normal bowel sounds MUSCULOSKELETAL: no cyanosis of digits and no clubbing  NEURO: alert & oriented x 3 with fluent speech, no focal motor/sensory  deficits EXTREMITIES: No lower extremity edema  LABORATORY DATA:  I have reviewed the data as listed CMP Latest Ref Rng & Units 10/31/2018 08/18/2017 05/02/2017  Glucose 70 - 99 mg/dL 93 98 88  BUN 8 - 23 mg/dL 18 24(H) 17  Creatinine 0.44 - 1.00 mg/dL 0.92 0.99 0.87  Sodium 135 - 145 mmol/L 140 139 140  Potassium 3.5 - 5.1 mmol/L 4.4 3.8 3.7  Chloride 98 - 111 mmol/L 104 101 103  CO2 22 - 32 mmol/L 25 32 32  Calcium 8.9 - 10.3 mg/dL 9.3 9.5 9.5  Total Protein 6.5 - 8.1 g/dL 6.4(L) - -  Total Bilirubin 0.3 - 1.2 mg/dL 0.9 - -  Alkaline Phos 38 - 126 U/L 54 - -  AST 15 - 41 U/L 32 - -  ALT 0 - 44 U/L <5 - -    Lab Results  Component Value Date   WBC 4.0 10/31/2018   HGB 12.7 10/31/2018   HCT 37.0 10/31/2018   MCV 93.0 10/31/2018   PLT 631 (H) 10/31/2018   NEUTROABS 2.6 10/31/2018    ASSESSMENT & PLAN:  Malignant neoplasm of lower-outer quadrant of left breast of female, estrogen receptor positive ([place])  07/21/17: Left breast lower outer quadrant 5.4 x 2.3 x 3.8 cm mass along with additional satellite nodules measuring 6 mm and several other small satellite nodules, no lymphadenopathy, 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 Neoadjuvant therapy with hormonal therapy based upon low riskOncotype DX testresult (score 10: 7% ROR) on letrozole 2.5 mg daily 08/21/2017-11/08/2018 2.  Followed by left mastectomy: 11/08/2018 3. Followed by adjuvant radiation 5. Followed by antiestrogen therapy ----------------------------------------------------------------  11/08/2018:Left mastectomy: 5 cm invasive lobular cancer with 40% cellularity, grade 2, involves anterior margin which is skin, no LVI or PNI, 0/2 lymph nodes negative, ER 100%, PR 0%, Ki-67 5%, HER-2 -1+ by IHC, T2 N0  Pathology counseling: I discussed the final pathology report of the patient  provided  a copy of this report. I discussed the margins as well as lymph node surgeries. We also discussed the final staging along with previously performed ER/PR and HER-2/neu testing.  Plan: Adjuvant radiation therapy followed by adjuvant letrozole. Patient is very anxious about her Parkinson's disease and how radiation affects not only Parkinson's but also her cardiac function and lung function. We will make an appointment with her radiation oncologist.  Return to clinic in about 4 months to meet for survivorship care plan visit  No orders of the defined types were placed in this encounter.  The patient has a good understanding of the overall plan. she agrees with it. she will call with any problems that may develop before the next visit here.  Nicholas Lose, MD 11/20/2018  Julious Oka Amanda [name] am acting as scribe for Dr. Nicholas Lose.  I have reviewed the above documentation for accuracy and completeness, and I agree with the above.

## 2018-11-16 ENCOUNTER — Ambulatory Visit: Payer: Self-pay | Admitting: Hematology and Oncology

## 2018-11-16 DIAGNOSIS — G20 Parkinson's disease: Secondary | ICD-10-CM | POA: Diagnosis not present

## 2018-11-16 DIAGNOSIS — F419 Anxiety disorder, unspecified: Secondary | ICD-10-CM | POA: Diagnosis not present

## 2018-11-16 DIAGNOSIS — Z171 Estrogen receptor negative status [ER-]: Secondary | ICD-10-CM | POA: Diagnosis not present

## 2018-11-16 DIAGNOSIS — Z4801 Encounter for change or removal of surgical wound dressing: Secondary | ICD-10-CM | POA: Diagnosis not present

## 2018-11-16 DIAGNOSIS — Z483 Aftercare following surgery for neoplasm: Secondary | ICD-10-CM | POA: Diagnosis not present

## 2018-11-16 DIAGNOSIS — C50512 Malignant neoplasm of lower-outer quadrant of left female breast: Secondary | ICD-10-CM | POA: Diagnosis not present

## 2018-11-19 DIAGNOSIS — F419 Anxiety disorder, unspecified: Secondary | ICD-10-CM | POA: Diagnosis not present

## 2018-11-19 DIAGNOSIS — C50512 Malignant neoplasm of lower-outer quadrant of left female breast: Secondary | ICD-10-CM | POA: Diagnosis not present

## 2018-11-19 DIAGNOSIS — Z483 Aftercare following surgery for neoplasm: Secondary | ICD-10-CM | POA: Diagnosis not present

## 2018-11-19 DIAGNOSIS — Z171 Estrogen receptor negative status [ER-]: Secondary | ICD-10-CM | POA: Diagnosis not present

## 2018-11-19 DIAGNOSIS — G20 Parkinson's disease: Secondary | ICD-10-CM | POA: Diagnosis not present

## 2018-11-19 DIAGNOSIS — Z4801 Encounter for change or removal of surgical wound dressing: Secondary | ICD-10-CM | POA: Diagnosis not present

## 2018-11-20 ENCOUNTER — Telehealth: Payer: Self-pay | Admitting: Adult Health

## 2018-11-20 ENCOUNTER — Inpatient Hospital Stay: Payer: Medicare Other | Attending: Hematology and Oncology | Admitting: Hematology and Oncology

## 2018-11-20 DIAGNOSIS — Z79811 Long term (current) use of aromatase inhibitors: Secondary | ICD-10-CM

## 2018-11-20 DIAGNOSIS — Z9010 Acquired absence of unspecified breast and nipple: Secondary | ICD-10-CM | POA: Insufficient documentation

## 2018-11-20 DIAGNOSIS — Z923 Personal history of irradiation: Secondary | ICD-10-CM

## 2018-11-20 DIAGNOSIS — G20 Parkinson's disease: Secondary | ICD-10-CM

## 2018-11-20 DIAGNOSIS — Z7982 Long term (current) use of aspirin: Secondary | ICD-10-CM | POA: Insufficient documentation

## 2018-11-20 DIAGNOSIS — C50512 Malignant neoplasm of lower-outer quadrant of left female breast: Secondary | ICD-10-CM | POA: Diagnosis not present

## 2018-11-20 DIAGNOSIS — Z79899 Other long term (current) drug therapy: Secondary | ICD-10-CM | POA: Insufficient documentation

## 2018-11-20 DIAGNOSIS — Z170 Estrogen receptor positive status [ER+]: Secondary | ICD-10-CM | POA: Insufficient documentation

## 2018-11-20 NOTE — Telephone Encounter (Signed)
Gave avs and calendar ° °

## 2018-11-20 NOTE — Assessment & Plan Note (Signed)
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 Neoadjuvant therapy with hormonal therapy based upon low riskOncotype DX testresult (score 10: 7% ROR) on letrozole 2.5 mg daily 08/21/2017-11/08/2018 2.  Followed by left mastectomy: 11/08/2018 3. Followed by adjuvant radiation 5. Followed by antiestrogen therapy ----------------------------------------------------------------  11/08/2018:Left mastectomy: 5 cm invasive lobular cancer with 40% cellularity, grade 2, involves anterior margin which is skin, no LVI or PNI, 0/2 lymph nodes negative, ER 100%, PR 0%, Ki-67 5%, HER-2 -1+ by IHC, T2 N0  Pathology counseling: I discussed the final pathology report of the patient provided  a copy of this report. I discussed the margins as well as lymph node surgeries. We also discussed the final staging along with previously performed ER/PR and HER-2/neu testing.  Plan: Adjuvant radiation therapy followed by adjuvant letrozole. Return to clinic at the end of radiation therapy 

## 2018-11-21 NOTE — Progress Notes (Signed)
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  Receptor Status: ER(95%), PR (60%), Her2-neu (NEG), Ki-(10%)  11/08/18 Diagnosis 1. Breast, simple mastectomy, Left - INVASIVE LOBULAR CARCINOMA, GRADE II. SEE NOTE. - CARCINOMA FOCALLY INVOLVES THE ANTERIOR RESECTION MARGIN AND IS 0.4 CM FROM THE POSTERIOR MARGIN. - NO EVIDENCE OF LYMPHOVASCULAR OR PERINEURAL INVASION. - NO DEFINITE EVIDENCE OF RESPONSE TO PRESURGICAL THERAPY. - LYMPH NODE, NEGATIVE FOR CARCINOMA (0/1) (CONFIRMED WITH IMMUNOSTAIN FOR PANKERATIN) - BIOPSY SITE CHANGES. - SEE ONCOLOGY TABLE. 2. Lymph node, sentinel, biopsy, Left axillary - LYMPH NODE, NEGATIVE FOR CARCINOMA (0/1) (CONFIRMED WITH IMMUNOSTAIN FOR PANKERATIN)  Did patient present with symptoms or was this found on screening mammography?: She found the mass herself in mid October  Past/Anticipated interventions by surgeon, if any: 11/08/18 Procedure: Left simple mastectomy with left axillary sentinel lymph node mapping of deep level 1 left axillary lymph node Surgeon: Erroll Luna, MD  Past/Anticipated interventions by medical oncology, if any: 11/20/18 Dr. Lindi Adie Plan: Adjuvant radiation therapy followed by adjuvant letrozole. Patient is very anxious about her Parkinson's disease and how radiation affects not only Parkinson's but also her cardiac function and lung function. We will make an appointment with her radiation oncologist.  Return to clinic in about 4 months to meet for survivorship care plan visit   Lymphedema issues, if any:  She denies.    Pain issues, if any:  She reports pain to her left lateral breast when being touched. She has pain related to her feet which has been chronic  SAFETY ISSUES:  Prior radiation? No  Pacemaker/ICD? No  Possible current pregnancy?  N/A  Is the patient on methotrexate? No  Current Complaints / other details:    BP 139/67 (BP Location: Right Arm)   Pulse 63   Temp 97.8 F (36.6 C) (Oral)   Ht _0  (1.626 m)   Wt 130 lb 9.6 oz (59.2 kg)   SpO2 100% Comment: room air  BMI 22.42 kg/m    Wt Readings from Last 3 Encounters:  11/27/18 130 lb 9.6 oz (59.2 kg)  11/20/18 129 lb 14.4 oz (58.9 kg)  11/08/18 128 lb 4.9 oz (58.2 kg)

## 2018-11-22 DIAGNOSIS — F419 Anxiety disorder, unspecified: Secondary | ICD-10-CM | POA: Diagnosis not present

## 2018-11-22 DIAGNOSIS — Z483 Aftercare following surgery for neoplasm: Secondary | ICD-10-CM | POA: Diagnosis not present

## 2018-11-22 DIAGNOSIS — G20 Parkinson's disease: Secondary | ICD-10-CM | POA: Diagnosis not present

## 2018-11-22 DIAGNOSIS — Z4801 Encounter for change or removal of surgical wound dressing: Secondary | ICD-10-CM | POA: Diagnosis not present

## 2018-11-22 DIAGNOSIS — Z171 Estrogen receptor negative status [ER-]: Secondary | ICD-10-CM | POA: Diagnosis not present

## 2018-11-22 DIAGNOSIS — C50512 Malignant neoplasm of lower-outer quadrant of left female breast: Secondary | ICD-10-CM | POA: Diagnosis not present

## 2018-11-24 IMAGING — MG 2D DIGITAL DIAGNOSTIC BILATERAL MAMMOGRAM WITH CAD AND ADJUNCT T
8 of 15 series · 8 of 35 positions shown · non-contrast
Comparison: Previous exam(s).

CLINICAL DATA: Patient describes a new palpable lump within the
left breast, between 3 and 5 o'clock, felt by patient and ordering
physician.

EXAM:
2D DIGITAL DIAGNOSTIC BILATERAL MAMMOGRAM WITH CAD AND ADJUNCT TOMO
ULTRASOUND LEFT BREAST

[R MLO]
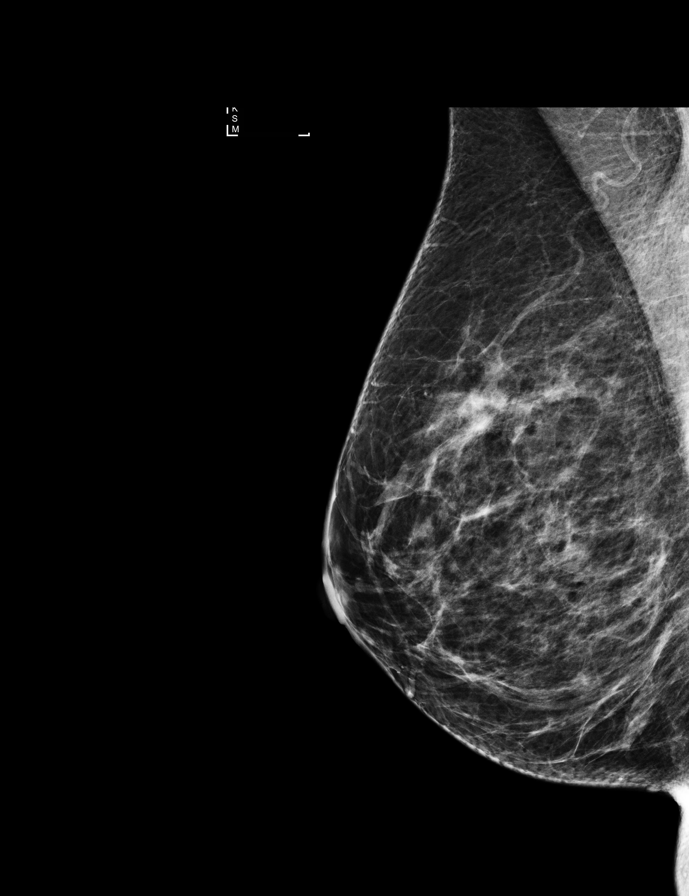

[L MLO]
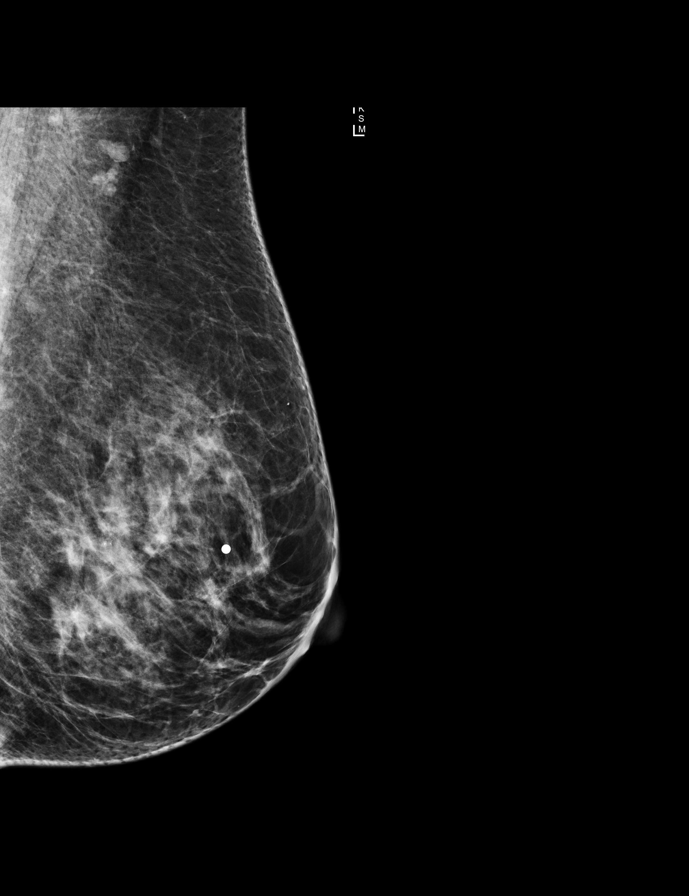

[L CC (1 of 2)]
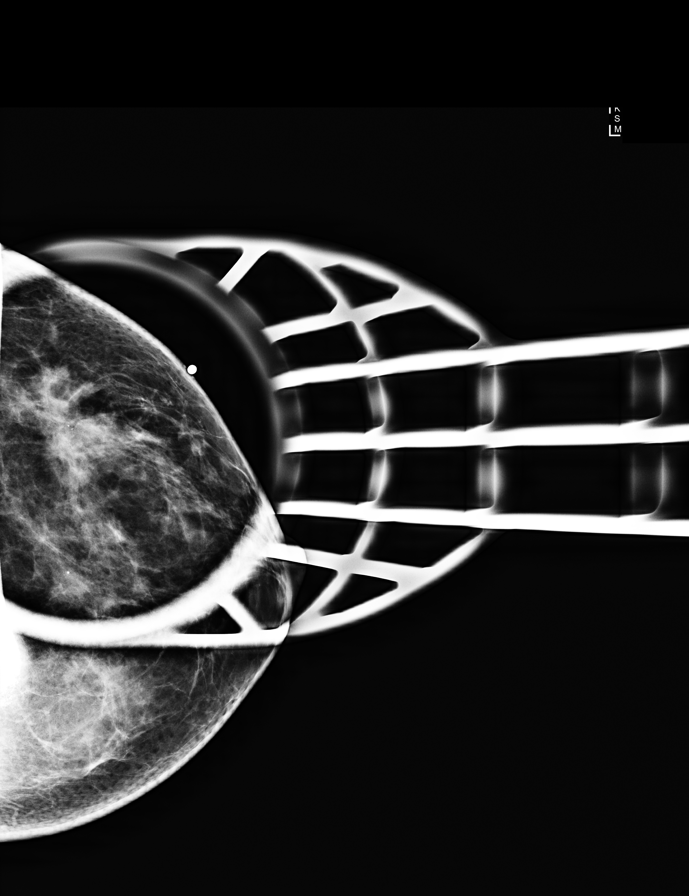

[R MLO synth-2D]
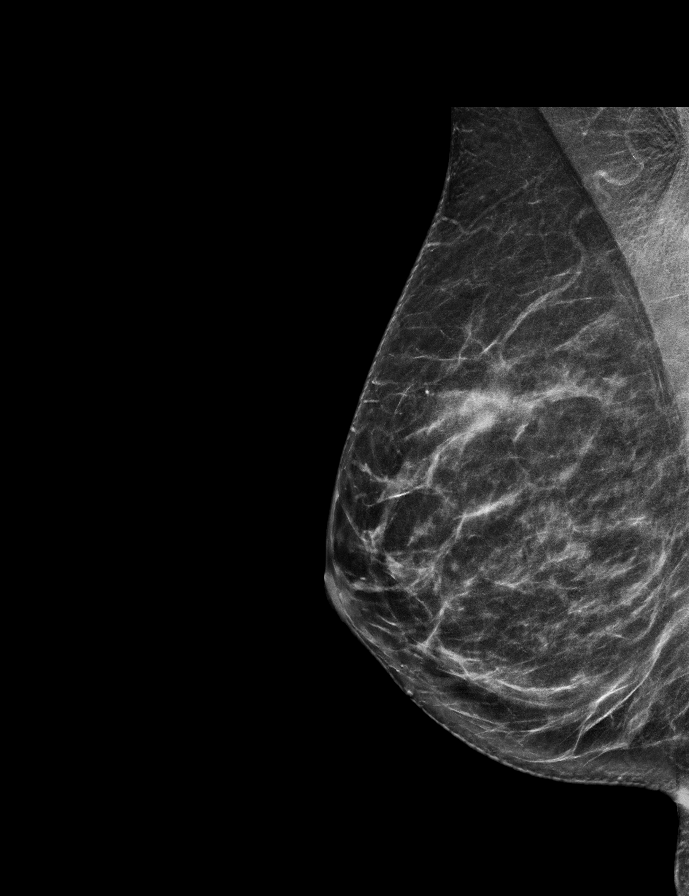

[R CC synth-2D]
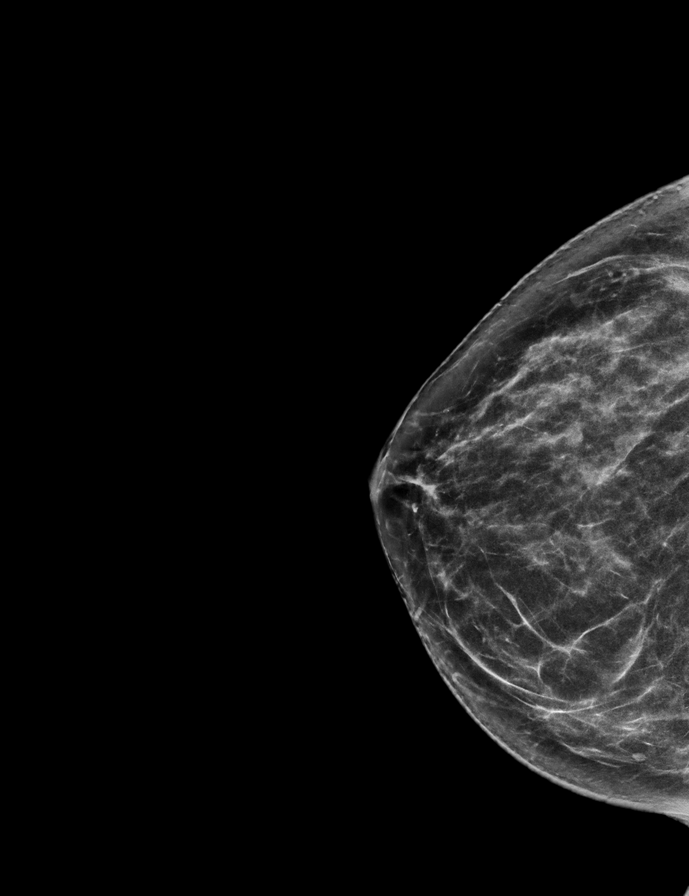

[L CC (2 of 2)]
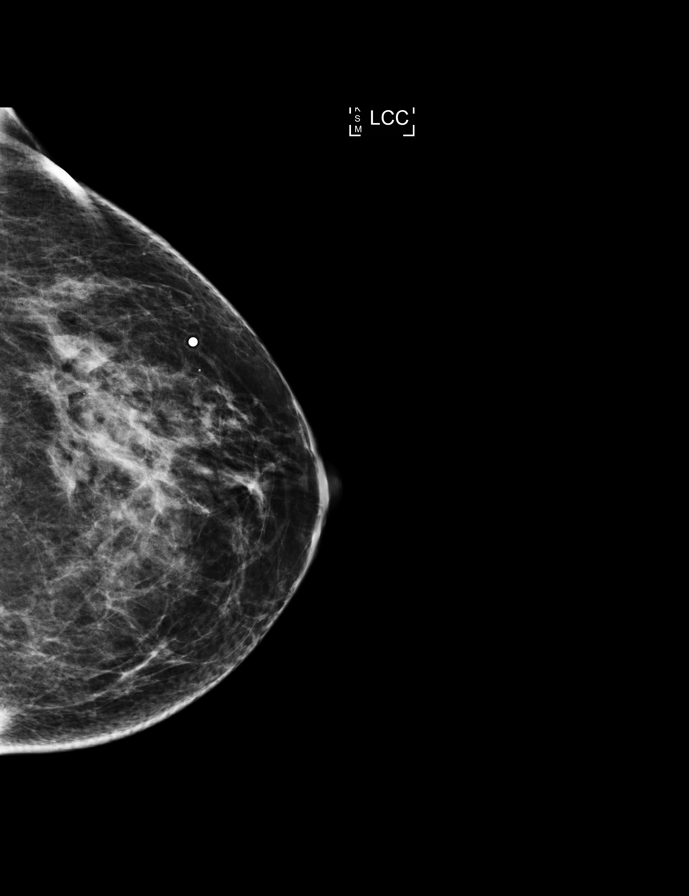

[R CC]
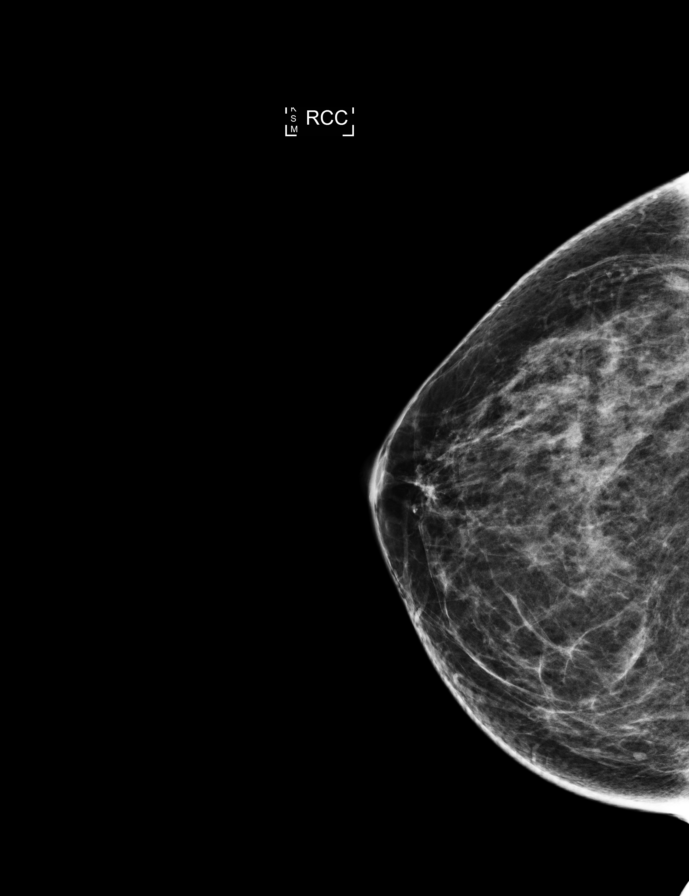

[L CC synth-2D]
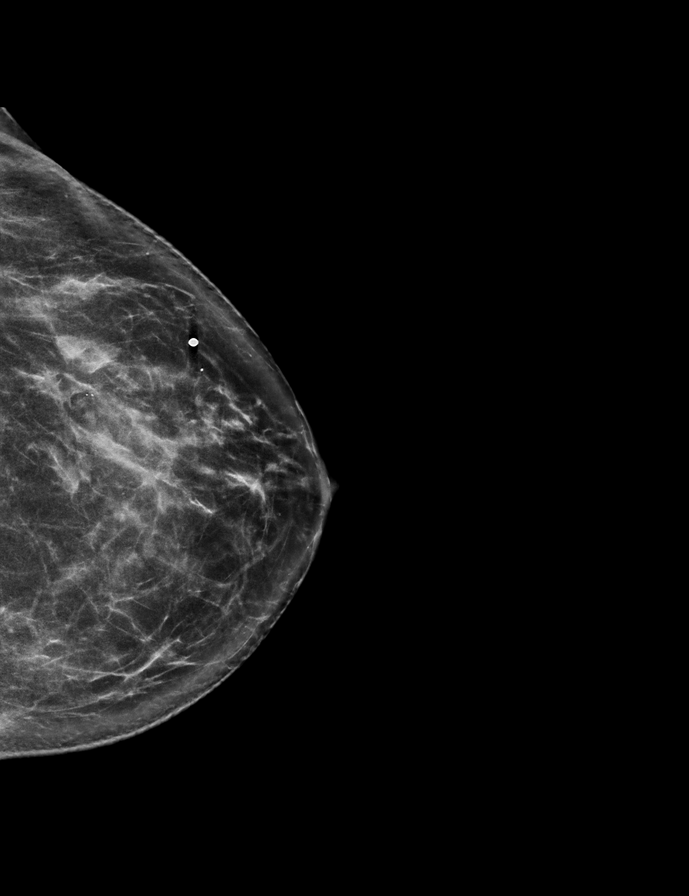

[8 of 35 positions shown; findings below may reference images not displayed]

ACR Breast Density Category c: The breast tissue is heterogeneously
dense, which may obscure small masses.
FINDINGS: Bilateral 2D CC and MLO views were obtained today, with additional
3D tomosynthesis, and with additional spot compression view of the
outer left breast corresponding to the area of clinical concern,
with overlying skin marker in place.

There is a new area of architectural distortion within the outer
left breast, 4 o'clock axis region, at posterior depth. Partially
obscured masses are seen within the area of architectural
distortion.

Mammographic images were processed with CAD.

On physical exam, there is a firm palpable mass within the outer
left breast.

Targeted ultrasound is performed, showing an irregular hypoechoic
mass within the left breast at the 3:30 o'clock axis, 4 cm from
nipple, measuring 2.6 x 1.8 x 1.9 cm, corresponding to the
mammographic finding. Additional contiguous component is seen at the
4 o'clock axis, 4 cm from nipple, measuring 1.4 cm greatest
dimension. Overall, the dominant component of the mass and the
contiguous smaller component measure approximately 4 cm greatest
dimension.

Left axilla was evaluated with ultrasound showing no enlarged or
morphologically abnormal lymph nodes.
IMPRESSION: 1. Highly suspicious mass within the left breast, dominant component
at the 3:30 o'clock axis, smaller contiguous component at the 4
o'clock axis, dominant component measuring 2.6 cm greatest
dimension, overall measuring approximately 4 cm extent.
Ultrasound-guided biopsy is recommended.

2.  No evidence of malignancy within the right breast.

RECOMMENDATION:
Ultrasound-guided biopsy of the left breast mass.

Ultrasound-guided biopsy will be performed later today.

I have discussed the findings and recommendations with the patient.
Results were also provided in writing at the conclusion of the
visit. If applicable, a reminder letter will be sent to the patient
regarding the next appointment.

BI-RADS CATEGORY  5: Highly suggestive of malignancy.

## 2018-11-27 ENCOUNTER — Ambulatory Visit: Admission: RE | Admit: 2018-11-27 | Payer: Medicare Other | Source: Ambulatory Visit | Admitting: Radiation Oncology

## 2018-11-27 ENCOUNTER — Other Ambulatory Visit: Payer: Self-pay

## 2018-11-27 ENCOUNTER — Encounter: Payer: Self-pay | Admitting: Radiation Oncology

## 2018-11-27 ENCOUNTER — Ambulatory Visit
Admission: RE | Admit: 2018-11-27 | Discharge: 2018-11-27 | Disposition: A | Discharge status: Home / Self Care | Payer: Medicare Other | Source: Ambulatory Visit | Attending: Radiation Oncology | Admitting: Radiation Oncology

## 2018-11-27 DIAGNOSIS — Z7982 Long term (current) use of aspirin: Secondary | ICD-10-CM | POA: Diagnosis not present

## 2018-11-27 DIAGNOSIS — Z79811 Long term (current) use of aromatase inhibitors: Secondary | ICD-10-CM | POA: Diagnosis not present

## 2018-11-27 DIAGNOSIS — Z170 Estrogen receptor positive status [ER+]: Principal | ICD-10-CM

## 2018-11-27 DIAGNOSIS — C50512 Malignant neoplasm of lower-outer quadrant of left female breast: Secondary | ICD-10-CM

## 2018-11-27 DIAGNOSIS — G20 Parkinson's disease: Secondary | ICD-10-CM | POA: Insufficient documentation

## 2018-11-27 DIAGNOSIS — Z79899 Other long term (current) drug therapy: Secondary | ICD-10-CM | POA: Insufficient documentation

## 2018-11-27 DIAGNOSIS — C50412 Malignant neoplasm of upper-outer quadrant of left female breast: Secondary | ICD-10-CM | POA: Diagnosis not present

## 2018-11-27 DIAGNOSIS — Z9012 Acquired absence of left breast and nipple: Secondary | ICD-10-CM | POA: Diagnosis not present

## 2018-11-27 NOTE — Progress Notes (Signed)
Radiation Oncology         [phone] ________________________________  Name: Amanda [name] MRN: [id number]  Date: 11/27/2018  DOB: 1946-05-29  Follow-Up Visit Note  Outpatient  CC: Ann Held, DO  Nicholas Lose, MD  Diagnosis:      ICD-10-CM   1. Malignant neoplasm of lower-outer quadrant of left breast of female, estrogen receptor positive ([place]) C50.512    Z17.0      Cancer Staging Malignant neoplasm of lower-outer quadrant of left breast of female, estrogen receptor positive ([place]) Staging form: Breast, AJCC 8th Edition - Clinical: Stage IIA (cT3, cN0, cM0, G2, ER: Positive, PR: Positive, HER2: Negative) - Signed by Eppie Gibson, MD on 08/15/2017 - Pathologic stage from 11/20/2018: No Stage Recommended (ypT2, pN0, cM0, G2, ER+, PR-, HER2-, Oncotype DX score: 10) - Signed by Nicholas Lose, MD on 11/20/2018   CHIEF COMPLAINT: Here to discuss management of left breast cancer  Narrative:  The patient returns today for follow-up to discuss radiation treatment options. She was last seen in our office on 08/15/2017.   Since consultation date, she was started on neoadjuvant antiestrogen therapy with letrozole on 08/21/2017 under Dr. Lindi Adie. She underwent bilateral breast MRI most recently on 08/21/2019 revealing: persistent non-mass enhancement in the LOQ of the left breast, extending to the skin; persistent nodularity along the periphery of the non-mass enhancement; 0.5 cm oval mass in the posterior central central portion of the left breast. Despite antiestrogen therapy, there was no significant  interval response.  She opted to proceed with left mastectomy on date of 11/08/2018 with pathology report revealing: tumor size of 5 cm; histology of invasive lobular carcinoma; margin status to invasive disease of focally involved anterior margin; nodal status of negative 0/2; ER status: 100%; PR status 0%, Her2 status: negative; Grade 2. Dr Brantley Stage reports no further surgery will be  done.  Symptomatically, the patient reports: pain with palpation to her left lateral chest         ALLERGIES:  is allergic to penicillins.  Meds: Current Outpatient Medications  Medication Sig Dispense Refill  . AMBULATORY NON FORMULARY MEDICATION Medication Name: Compression Stockings 20-36mHg  1 pair 1 each 0  . aspirin EC 325 MG tablet Take 325 mg by mouth daily as needed for moderate pain.    .Marland Kitchenb complex vitamins tablet Take 1 tablet by mouth daily. MEGA FOOD BALANCE SOURCE    . CALCIUM-MAGNESIUM-VITAMIN D PO Take 2 tablets by mouth 2 (two) times daily.    . carbidopa-levodopa (SINEMET IR) 25-100 MG tablet Take 1 tablet by mouth 2 (two) times daily.    . Cholecalciferol (VITAMIN D3) 1000 UNIT/SPRAY LIQD Take 2 sprays by mouth 2 (two) times daily.     .Marland Kitchenletrozole (FEMARA) 2.5 MG tablet TAKE 1 TABLET BY MOUTH EVERY DAY (Patient taking differently: Take 2.5 mg by mouth at bedtime. ) 90 tablet 3  . pramipexole (MIRAPEX) 1 MG tablet Take 1 tablet by mouth 3 (three) times daily.  3  . Probiotic Product (PROBIOTIC DAILY) CAPS Take 1 capsule by mouth daily as needed (indigestion).     . sertraline (ZOLOFT) 50 MG tablet Take 50 mg by mouth at bedtime.     . Zinc Gluconate (COLD-EEZE) 13.3 MG LOZG Use as directed 13.3 mg in the mouth or throat daily as needed (cold symptoms).    . Propylene Glycol-Glycerin (SOOTHE OP) Place 2 drops into both eyes daily as needed (dry eyes).     No current facility-administered  medications for this encounter.     Physical Findings:  height is '5\' 4"'  (1.626 m) and weight is 130 lb 9.6 oz (59.2 kg). Her oral temperature is 97.8 F (36.6 C). Her blood pressure is 139/67 and her pulse is 63. Her oxygen saturation is 100%. .    General: Alert and oriented, in mild distress, blunted affect Neurologic: resting tremor Breast exam reveals healing mastectomy scar, left chest, with mild edema in upper chest tissues. MSK: some limited ROM/ discomfort with left  shoulder abduction due to tightness in surgical site  Lab Findings: Lab Results  Component Value Date   WBC 4.0 10/31/2018   HGB 12.7 10/31/2018   HCT 37.0 10/31/2018   MCV 93.0 10/31/2018   PLT 631 (H) 10/31/2018    Radiographic Findings: Nm Sentinel Node Inj-no Rpt (breast)  Result Date: 11/08/2018 Sulfur colloid was injected by the nuclear medicine technologist for melanoma sentinel node.    Impression/Plan: Left Breast LOQ Invasive Lobular Carcinoma, ER+ /PR-  We discussed adjuvant radiotherapy today.  I recommend 6 weeks directed at the left chest wall with high tangents in order to reduce locoregional recurrence by 2/3; this is indicated due to the tumor size and positive margins.  The risks, benefits and side effects of this treatment were discussed in detail.  She understands that radiotherapy is associated with skin irritation and fatigue in the acute setting. Late effects can include cosmetic changes and rare injury to internal organs.  We discussed our techniques to spare the heart and lung from excessive dose.  The patient was very hesitant about proceeding with radiation. She is anxious this will affect her Parkinsons' Disease and I reassured her that since we are not treating her brain, it will not exacerbate her disease; however, it could cause some stress which may temporarily enhance certain symptoms (ie tremor) though I am not certain that will be the case.   She expressed feeling rushed because she and her husband were uninformed before getting here about what this appointment was meant to accomplish. To ease her anxieties, we will move back her CT simulation to closer to our anticipated start date in 2 weeks. We will also add extra time (~15 min) to the beginning to go over the consent form again - she will bring this home to review further. She is scheduled for 12/11/2018 at 10:45 am.  I did make it very clear that radiation is recommended, but not obligatory.  I do not  want her to feel pressured to do this against her own will - it is 100% her choice whether to follow my recommendations for the best chance of cure.  I will ask social work to reach out to her today - she seems quite anxious and extra support could be quite helpful.  I spent 40 minutes minutes face to face with the patient and more than 50% of that time was spent in counseling and/or coordination of care. _____________________________________   Eppie Gibson, MD   This document serves as a record of services personally performed by Eppie Gibson, MD. It was created on her behalf by Wilburn Mylar, a trained medical scribe. The creation of this record is based on the scribe's personal observations and the provider's statements to them. This document has been checked and approved by the attending provider.

## 2018-11-28 ENCOUNTER — Other Ambulatory Visit: Payer: Self-pay | Admitting: Radiation Oncology

## 2018-11-28 ENCOUNTER — Encounter: Payer: Self-pay | Admitting: Radiation Oncology

## 2018-11-28 DIAGNOSIS — Z170 Estrogen receptor positive status [ER+]: Principal | ICD-10-CM

## 2018-11-28 DIAGNOSIS — C50512 Malignant neoplasm of lower-outer quadrant of left female breast: Secondary | ICD-10-CM

## 2018-11-30 ENCOUNTER — Telehealth: Payer: Self-pay | Admitting: General Practice

## 2018-11-30 NOTE — Telephone Encounter (Signed)
[facility] CSW Progress Notes  Referral received from MD to call patient to provide support and reassurance re radiation treatment.  Called patient.  She is having difficulty processing need to begin radiation treatment so soon after surgery - feels she is still sore and that recommended CT scan would be difficult.  Also wants to be sure that MDs have spoken w each other about the timing of radiation therapy in her particular case.  Is reviewing consent forms and aware that side effects are possible w this treatment, wants to know that she is making good decision re treatment.  Would like to talk w another patient who has had similar treatment to understand from patient's perspective how radiation treatments were tolerated.  CSW will refer to [employer] program for mentor match.  Will also send staff message to MDs outlining patient's concerns and requesting guidance for appropriate follow up w patient.  Patient admits "Im not really looking forward to this anyhow", expressing normal anxiety about initiating treatment.  Discussed reality of risks of no treatment, delay in treatment and possible treatment side effects - all of which need to be weighed thoughtfully.    Amanda Shell, LCSW Clinical Social Worker Phone:  [phone]

## 2018-12-10 ENCOUNTER — Telehealth: Payer: Self-pay | Admitting: Hematology and Oncology

## 2018-12-10 ENCOUNTER — Other Ambulatory Visit: Payer: Self-pay | Admitting: Radiation Oncology

## 2018-12-10 ENCOUNTER — Telehealth: Payer: Self-pay | Admitting: Radiation Oncology

## 2018-12-10 NOTE — Telephone Encounter (Signed)
I informed the patient that upon discussing with Dr. Isidore Moos and hearing her concerns regarding coronavirus, we will hold off on starting radiation until June. In the meantime she will continue with antiestrogen therapy with letrozole.  She is very happy to hear this with message.

## 2018-12-10 NOTE — Progress Notes (Signed)
I spoke with Dr Lindi Adie and the patient today.  Consensus is to resume antiestrogens and delay RT secondary to patient's valid concerns about coronavirus.  I'll plan to see her for f/u in June, with simulation.  Pt and I spoke by phone and she is happy with this plan..  We discussed measures to reduce the risk of infection during the COVID-19 pandemic.    -----------------------------------  Eppie Gibson, MD

## 2018-12-11 ENCOUNTER — Ambulatory Visit: Payer: Medicare Other | Admitting: Radiation Oncology

## 2018-12-11 NOTE — Telephone Encounter (Signed)
Opened in error

## 2019-03-04 ENCOUNTER — Other Ambulatory Visit: Payer: Self-pay

## 2019-03-04 ENCOUNTER — Ambulatory Visit
Admission: RE | Admit: 2019-03-04 | Discharge: 2019-03-04 | Disposition: A | Discharge status: Home / Self Care | Payer: Medicare Other | Source: Ambulatory Visit | Attending: Radiation Oncology | Admitting: Radiation Oncology

## 2019-03-04 DIAGNOSIS — Z170 Estrogen receptor positive status [ER+]: Secondary | ICD-10-CM | POA: Insufficient documentation

## 2019-03-04 DIAGNOSIS — Z510 Encounter for antineoplastic radiation therapy: Secondary | ICD-10-CM | POA: Insufficient documentation

## 2019-03-04 DIAGNOSIS — C50512 Malignant neoplasm of lower-outer quadrant of left female breast: Secondary | ICD-10-CM | POA: Diagnosis not present

## 2019-03-04 DIAGNOSIS — Z853 Personal history of malignant neoplasm of breast: Secondary | ICD-10-CM | POA: Diagnosis not present

## 2019-03-05 DIAGNOSIS — G20 Parkinson's disease: Secondary | ICD-10-CM | POA: Diagnosis not present

## 2019-03-05 NOTE — Progress Notes (Signed)
  Radiation Oncology         [phone] ________________________________  Name: Amanda [name] MRN: [id number]  Date: 03/04/2019  DOB: 1946-01-23  SIMULATION AND TREATMENT PLANNING NOTE    Outpatient  DIAGNOSIS:     ICD-10-CM   1. Malignant neoplasm of lower-outer quadrant of left breast of female, estrogen receptor positive ([place])  C50.512    Z17.0     NARRATIVE:  The patient was brought to the [facility].  Identity was confirmed.  All relevant records and images related to the planned course of therapy were reviewed.  The patient freely provided informed written consent to proceed with treatment after reviewing the details related to the planned course of therapy. The consent form was witnessed and verified by the simulation staff.    Then, the patient was set-up in a stable reproducible supine position for radiation therapy with her ipsilateral arm over her head, and her upper body secured in a custom-made Vac-lok device.  CT images were obtained.  Surface markings were placed.  The CT images were loaded into the planning software.    TREATMENT PLANNING NOTE: Treatment planning then occurred.  The radiation prescription was entered and confirmed.     A total of 3 medically necessary complex treatment devices were fabricated and supervised by me: 2 fields with MLCs for custom blocks to protect heart, and lungs;  and, a Vac-lok. MORE COMPLEX DEVICES MAY BE MADE IN DOSIMETRY FOR FIELD IN FIELD BEAMS FOR DOSE HOMOGENEITY.  I have requested : 3D Simulation which is medically necessary to give adequate dose to at risk tissues while sparing lungs and heart.  I have requested a DVH of the following structures: lungs, heart, target.    The patient will receive 50 Gy in 25 fractions to the left chest wall and axilla with 2 high tangential fields.  This will be followed by a boost.  Optical Surface Tracking Plan:  Since intensity modulated radiotherapy (IMRT) and 3D conformal  radiation treatment methods are predicated on accurate and precise positioning for treatment, intrafraction motion monitoring is medically necessary to ensure accurate and safe treatment delivery. The ability to quantify intrafraction motion without excessive ionizing radiation dose can only be performed with optical surface tracking. Accordingly, surface imaging offers the opportunity to obtain 3D measurements of patient position throughout IMRT and 3D treatments without excessive radiation exposure. I am ordering optical surface tracking for this patient's upcoming course of radiotherapy.  ________________________________   Reference:  Ursula Alert, J, et al. Surface imaging-based analysis of intrafraction motion for breast radiotherapy patients.Journal of [place], n. 6, nov. 2014. ISSN [id number].  Available at: <http://www.jacmp.org/index.php/jacmp/article/view/4957>.    -----------------------------------  Eppie Gibson, MD

## 2019-03-06 DIAGNOSIS — Z510 Encounter for antineoplastic radiation therapy: Secondary | ICD-10-CM | POA: Diagnosis not present

## 2019-03-06 DIAGNOSIS — Z170 Estrogen receptor positive status [ER+]: Secondary | ICD-10-CM | POA: Diagnosis not present

## 2019-03-06 DIAGNOSIS — C50512 Malignant neoplasm of lower-outer quadrant of left female breast: Secondary | ICD-10-CM | POA: Diagnosis not present

## 2019-03-07 ENCOUNTER — Telehealth: Payer: Self-pay

## 2019-03-08 ENCOUNTER — Telehealth: Payer: Self-pay | Admitting: Adult Health

## 2019-03-08 NOTE — Telephone Encounter (Signed)
I LEFT a message regarding delay

## 2019-03-11 ENCOUNTER — Ambulatory Visit
Admission: RE | Admit: 2019-03-11 | Discharge: 2019-03-11 | Disposition: A | Discharge status: Home / Self Care | Payer: Medicare Other | Source: Ambulatory Visit | Attending: Radiation Oncology | Admitting: Radiation Oncology

## 2019-03-11 ENCOUNTER — Other Ambulatory Visit: Payer: Self-pay

## 2019-03-11 DIAGNOSIS — Z170 Estrogen receptor positive status [ER+]: Secondary | ICD-10-CM | POA: Diagnosis not present

## 2019-03-11 DIAGNOSIS — C50512 Malignant neoplasm of lower-outer quadrant of left female breast: Secondary | ICD-10-CM | POA: Diagnosis not present

## 2019-03-11 DIAGNOSIS — Z510 Encounter for antineoplastic radiation therapy: Secondary | ICD-10-CM | POA: Diagnosis not present

## 2019-03-12 ENCOUNTER — Other Ambulatory Visit: Payer: Self-pay

## 2019-03-12 ENCOUNTER — Ambulatory Visit
Admission: RE | Admit: 2019-03-12 | Discharge: 2019-03-12 | Disposition: A | Discharge status: Home / Self Care | Payer: Medicare Other | Source: Ambulatory Visit | Attending: Radiation Oncology | Admitting: Radiation Oncology

## 2019-03-12 DIAGNOSIS — Z170 Estrogen receptor positive status [ER+]: Secondary | ICD-10-CM | POA: Diagnosis not present

## 2019-03-12 DIAGNOSIS — Z510 Encounter for antineoplastic radiation therapy: Secondary | ICD-10-CM | POA: Diagnosis not present

## 2019-03-12 DIAGNOSIS — C50512 Malignant neoplasm of lower-outer quadrant of left female breast: Secondary | ICD-10-CM | POA: Diagnosis not present

## 2019-03-13 ENCOUNTER — Ambulatory Visit
Admission: RE | Admit: 2019-03-13 | Discharge: 2019-03-13 | Disposition: A | Discharge status: Home / Self Care | Payer: Medicare Other | Source: Ambulatory Visit | Attending: Radiation Oncology | Admitting: Radiation Oncology

## 2019-03-13 ENCOUNTER — Other Ambulatory Visit: Payer: Self-pay

## 2019-03-13 DIAGNOSIS — Z510 Encounter for antineoplastic radiation therapy: Secondary | ICD-10-CM | POA: Diagnosis not present

## 2019-03-13 DIAGNOSIS — C50512 Malignant neoplasm of lower-outer quadrant of left female breast: Secondary | ICD-10-CM | POA: Diagnosis not present

## 2019-03-13 DIAGNOSIS — Z170 Estrogen receptor positive status [ER+]: Secondary | ICD-10-CM | POA: Diagnosis not present

## 2019-03-14 ENCOUNTER — Other Ambulatory Visit: Payer: Self-pay

## 2019-03-14 ENCOUNTER — Ambulatory Visit
Admission: RE | Admit: 2019-03-14 | Discharge: 2019-03-14 | Disposition: A | Discharge status: Home / Self Care | Payer: Medicare Other | Source: Ambulatory Visit | Attending: Radiation Oncology | Admitting: Radiation Oncology

## 2019-03-14 DIAGNOSIS — C50512 Malignant neoplasm of lower-outer quadrant of left female breast: Secondary | ICD-10-CM | POA: Diagnosis not present

## 2019-03-14 DIAGNOSIS — Z510 Encounter for antineoplastic radiation therapy: Secondary | ICD-10-CM | POA: Diagnosis not present

## 2019-03-14 DIAGNOSIS — Z170 Estrogen receptor positive status [ER+]: Secondary | ICD-10-CM | POA: Diagnosis not present

## 2019-03-15 ENCOUNTER — Ambulatory Visit
Admission: RE | Admit: 2019-03-15 | Discharge: 2019-03-15 | Disposition: A | Discharge status: Home / Self Care | Payer: Medicare Other | Source: Ambulatory Visit | Attending: Radiation Oncology | Admitting: Radiation Oncology

## 2019-03-15 ENCOUNTER — Other Ambulatory Visit: Payer: Self-pay

## 2019-03-15 DIAGNOSIS — Z510 Encounter for antineoplastic radiation therapy: Secondary | ICD-10-CM | POA: Diagnosis not present

## 2019-03-15 DIAGNOSIS — Z170 Estrogen receptor positive status [ER+]: Secondary | ICD-10-CM | POA: Diagnosis not present

## 2019-03-15 DIAGNOSIS — C50512 Malignant neoplasm of lower-outer quadrant of left female breast: Secondary | ICD-10-CM | POA: Diagnosis not present

## 2019-03-18 ENCOUNTER — Ambulatory Visit
Admission: RE | Admit: 2019-03-18 | Discharge: 2019-03-18 | Disposition: A | Discharge status: Home / Self Care | Payer: Medicare Other | Source: Ambulatory Visit | Attending: Radiation Oncology | Admitting: Radiation Oncology

## 2019-03-18 ENCOUNTER — Other Ambulatory Visit: Payer: Self-pay

## 2019-03-18 DIAGNOSIS — C50512 Malignant neoplasm of lower-outer quadrant of left female breast: Secondary | ICD-10-CM | POA: Diagnosis not present

## 2019-03-18 DIAGNOSIS — Z510 Encounter for antineoplastic radiation therapy: Secondary | ICD-10-CM | POA: Diagnosis not present

## 2019-03-18 DIAGNOSIS — Z170 Estrogen receptor positive status [ER+]: Secondary | ICD-10-CM | POA: Diagnosis not present

## 2019-03-19 ENCOUNTER — Other Ambulatory Visit: Payer: Self-pay

## 2019-03-19 ENCOUNTER — Ambulatory Visit
Admission: RE | Admit: 2019-03-19 | Discharge: 2019-03-19 | Disposition: A | Discharge status: Home / Self Care | Payer: Medicare Other | Source: Ambulatory Visit | Attending: Radiation Oncology | Admitting: Radiation Oncology

## 2019-03-19 DIAGNOSIS — Z170 Estrogen receptor positive status [ER+]: Secondary | ICD-10-CM | POA: Diagnosis not present

## 2019-03-19 DIAGNOSIS — C50512 Malignant neoplasm of lower-outer quadrant of left female breast: Secondary | ICD-10-CM | POA: Diagnosis not present

## 2019-03-19 DIAGNOSIS — Z510 Encounter for antineoplastic radiation therapy: Secondary | ICD-10-CM | POA: Diagnosis not present

## 2019-03-20 ENCOUNTER — Ambulatory Visit
Admission: RE | Admit: 2019-03-20 | Discharge: 2019-03-20 | Disposition: A | Discharge status: Home / Self Care | Payer: Medicare Other | Source: Ambulatory Visit | Attending: Radiation Oncology | Admitting: Radiation Oncology

## 2019-03-20 ENCOUNTER — Other Ambulatory Visit: Payer: Self-pay

## 2019-03-20 DIAGNOSIS — Z510 Encounter for antineoplastic radiation therapy: Secondary | ICD-10-CM | POA: Insufficient documentation

## 2019-03-20 DIAGNOSIS — C50512 Malignant neoplasm of lower-outer quadrant of left female breast: Secondary | ICD-10-CM | POA: Diagnosis not present

## 2019-03-20 DIAGNOSIS — Z170 Estrogen receptor positive status [ER+]: Secondary | ICD-10-CM | POA: Insufficient documentation

## 2019-03-20 NOTE — Telephone Encounter (Signed)
Patient had called to see if she could come early for treatment explained that was up to [place]

## 2019-03-21 ENCOUNTER — Other Ambulatory Visit: Payer: Self-pay

## 2019-03-21 ENCOUNTER — Ambulatory Visit
Admission: RE | Admit: 2019-03-21 | Discharge: 2019-03-21 | Disposition: A | Discharge status: Home / Self Care | Payer: Medicare Other | Source: Ambulatory Visit | Attending: Radiation Oncology | Admitting: Radiation Oncology

## 2019-03-21 DIAGNOSIS — Z510 Encounter for antineoplastic radiation therapy: Secondary | ICD-10-CM | POA: Diagnosis not present

## 2019-03-21 DIAGNOSIS — Z170 Estrogen receptor positive status [ER+]: Secondary | ICD-10-CM | POA: Diagnosis not present

## 2019-03-21 DIAGNOSIS — C50512 Malignant neoplasm of lower-outer quadrant of left female breast: Secondary | ICD-10-CM | POA: Diagnosis not present

## 2019-03-25 ENCOUNTER — Other Ambulatory Visit: Payer: Self-pay

## 2019-03-25 ENCOUNTER — Ambulatory Visit
Admission: RE | Admit: 2019-03-25 | Discharge: 2019-03-25 | Disposition: A | Discharge status: Home / Self Care | Payer: Medicare Other | Source: Ambulatory Visit | Attending: Radiation Oncology | Admitting: Radiation Oncology

## 2019-03-25 ENCOUNTER — Encounter: Payer: Self-pay | Admitting: Adult Health

## 2019-03-25 DIAGNOSIS — C50512 Malignant neoplasm of lower-outer quadrant of left female breast: Secondary | ICD-10-CM | POA: Diagnosis not present

## 2019-03-25 DIAGNOSIS — Z510 Encounter for antineoplastic radiation therapy: Secondary | ICD-10-CM | POA: Diagnosis not present

## 2019-03-25 DIAGNOSIS — Z170 Estrogen receptor positive status [ER+]: Secondary | ICD-10-CM | POA: Diagnosis not present

## 2019-03-26 ENCOUNTER — Other Ambulatory Visit: Payer: Self-pay

## 2019-03-26 ENCOUNTER — Ambulatory Visit
Admission: RE | Admit: 2019-03-26 | Discharge: 2019-03-26 | Disposition: A | Discharge status: Home / Self Care | Payer: Medicare Other | Source: Ambulatory Visit | Attending: Radiation Oncology | Admitting: Radiation Oncology

## 2019-03-26 DIAGNOSIS — C50512 Malignant neoplasm of lower-outer quadrant of left female breast: Secondary | ICD-10-CM | POA: Diagnosis not present

## 2019-03-26 DIAGNOSIS — Z510 Encounter for antineoplastic radiation therapy: Secondary | ICD-10-CM | POA: Diagnosis not present

## 2019-03-26 DIAGNOSIS — Z170 Estrogen receptor positive status [ER+]: Secondary | ICD-10-CM | POA: Diagnosis not present

## 2019-03-27 ENCOUNTER — Other Ambulatory Visit: Payer: Self-pay

## 2019-03-27 ENCOUNTER — Ambulatory Visit
Admission: RE | Admit: 2019-03-27 | Discharge: 2019-03-27 | Disposition: A | Discharge status: Home / Self Care | Payer: Medicare Other | Source: Ambulatory Visit | Attending: Radiation Oncology | Admitting: Radiation Oncology

## 2019-03-27 DIAGNOSIS — Z170 Estrogen receptor positive status [ER+]: Secondary | ICD-10-CM | POA: Diagnosis not present

## 2019-03-27 DIAGNOSIS — C50512 Malignant neoplasm of lower-outer quadrant of left female breast: Secondary | ICD-10-CM | POA: Diagnosis not present

## 2019-03-27 DIAGNOSIS — Z510 Encounter for antineoplastic radiation therapy: Secondary | ICD-10-CM | POA: Diagnosis not present

## 2019-03-28 ENCOUNTER — Other Ambulatory Visit: Payer: Self-pay

## 2019-03-28 ENCOUNTER — Ambulatory Visit
Admission: RE | Admit: 2019-03-28 | Discharge: 2019-03-28 | Disposition: A | Discharge status: Home / Self Care | Payer: Medicare Other | Source: Ambulatory Visit | Attending: Radiation Oncology | Admitting: Radiation Oncology

## 2019-03-28 DIAGNOSIS — Z510 Encounter for antineoplastic radiation therapy: Secondary | ICD-10-CM | POA: Diagnosis not present

## 2019-03-28 DIAGNOSIS — Z170 Estrogen receptor positive status [ER+]: Secondary | ICD-10-CM | POA: Diagnosis not present

## 2019-03-28 DIAGNOSIS — C50512 Malignant neoplasm of lower-outer quadrant of left female breast: Secondary | ICD-10-CM | POA: Diagnosis not present

## 2019-03-29 ENCOUNTER — Other Ambulatory Visit: Payer: Self-pay

## 2019-03-29 ENCOUNTER — Ambulatory Visit
Admission: RE | Admit: 2019-03-29 | Discharge: 2019-03-29 | Disposition: A | Discharge status: Home / Self Care | Payer: Medicare Other | Source: Ambulatory Visit | Attending: Radiation Oncology | Admitting: Radiation Oncology

## 2019-03-29 DIAGNOSIS — C50512 Malignant neoplasm of lower-outer quadrant of left female breast: Secondary | ICD-10-CM | POA: Diagnosis not present

## 2019-03-29 DIAGNOSIS — Z510 Encounter for antineoplastic radiation therapy: Secondary | ICD-10-CM | POA: Diagnosis not present

## 2019-03-29 DIAGNOSIS — Z170 Estrogen receptor positive status [ER+]: Secondary | ICD-10-CM | POA: Diagnosis not present

## 2019-04-01 ENCOUNTER — Ambulatory Visit: Payer: Medicare Other

## 2019-04-02 ENCOUNTER — Ambulatory Visit
Admission: RE | Admit: 2019-04-02 | Discharge: 2019-04-02 | Disposition: A | Discharge status: Home / Self Care | Payer: Medicare Other | Source: Ambulatory Visit | Attending: Radiation Oncology | Admitting: Radiation Oncology

## 2019-04-02 ENCOUNTER — Other Ambulatory Visit: Payer: Self-pay

## 2019-04-02 DIAGNOSIS — Z170 Estrogen receptor positive status [ER+]: Secondary | ICD-10-CM | POA: Diagnosis not present

## 2019-04-02 DIAGNOSIS — C50512 Malignant neoplasm of lower-outer quadrant of left female breast: Secondary | ICD-10-CM | POA: Diagnosis not present

## 2019-04-02 DIAGNOSIS — Z510 Encounter for antineoplastic radiation therapy: Secondary | ICD-10-CM | POA: Diagnosis not present

## 2019-04-03 ENCOUNTER — Other Ambulatory Visit: Payer: Self-pay

## 2019-04-03 ENCOUNTER — Ambulatory Visit
Admission: RE | Admit: 2019-04-03 | Discharge: 2019-04-03 | Disposition: A | Discharge status: Home / Self Care | Payer: Medicare Other | Source: Ambulatory Visit | Attending: Radiation Oncology | Admitting: Radiation Oncology

## 2019-04-03 DIAGNOSIS — Z510 Encounter for antineoplastic radiation therapy: Secondary | ICD-10-CM | POA: Diagnosis not present

## 2019-04-03 DIAGNOSIS — C50512 Malignant neoplasm of lower-outer quadrant of left female breast: Secondary | ICD-10-CM | POA: Diagnosis not present

## 2019-04-03 DIAGNOSIS — Z170 Estrogen receptor positive status [ER+]: Secondary | ICD-10-CM | POA: Diagnosis not present

## 2019-04-04 ENCOUNTER — Other Ambulatory Visit: Payer: Self-pay

## 2019-04-04 ENCOUNTER — Ambulatory Visit
Admission: RE | Admit: 2019-04-04 | Discharge: 2019-04-04 | Disposition: A | Discharge status: Home / Self Care | Payer: Medicare Other | Source: Ambulatory Visit | Attending: Radiation Oncology | Admitting: Radiation Oncology

## 2019-04-04 DIAGNOSIS — C50512 Malignant neoplasm of lower-outer quadrant of left female breast: Secondary | ICD-10-CM | POA: Diagnosis not present

## 2019-04-04 DIAGNOSIS — Z510 Encounter for antineoplastic radiation therapy: Secondary | ICD-10-CM | POA: Diagnosis not present

## 2019-04-04 DIAGNOSIS — Z170 Estrogen receptor positive status [ER+]: Secondary | ICD-10-CM | POA: Diagnosis not present

## 2019-04-05 ENCOUNTER — Other Ambulatory Visit: Payer: Self-pay

## 2019-04-05 ENCOUNTER — Ambulatory Visit
Admission: RE | Admit: 2019-04-05 | Discharge: 2019-04-05 | Disposition: A | Discharge status: Home / Self Care | Payer: Medicare Other | Source: Ambulatory Visit | Attending: Radiation Oncology | Admitting: Radiation Oncology

## 2019-04-05 DIAGNOSIS — Z510 Encounter for antineoplastic radiation therapy: Secondary | ICD-10-CM | POA: Diagnosis not present

## 2019-04-05 DIAGNOSIS — Z170 Estrogen receptor positive status [ER+]: Secondary | ICD-10-CM | POA: Diagnosis not present

## 2019-04-05 DIAGNOSIS — C50512 Malignant neoplasm of lower-outer quadrant of left female breast: Secondary | ICD-10-CM | POA: Diagnosis not present

## 2019-04-08 ENCOUNTER — Ambulatory Visit
Admission: RE | Admit: 2019-04-08 | Discharge: 2019-04-08 | Disposition: A | Discharge status: Home / Self Care | Payer: Medicare Other | Source: Ambulatory Visit | Attending: Radiation Oncology | Admitting: Radiation Oncology

## 2019-04-08 ENCOUNTER — Other Ambulatory Visit: Payer: Self-pay

## 2019-04-08 DIAGNOSIS — C50512 Malignant neoplasm of lower-outer quadrant of left female breast: Secondary | ICD-10-CM | POA: Diagnosis not present

## 2019-04-08 DIAGNOSIS — Z170 Estrogen receptor positive status [ER+]: Secondary | ICD-10-CM | POA: Diagnosis not present

## 2019-04-08 DIAGNOSIS — Z510 Encounter for antineoplastic radiation therapy: Secondary | ICD-10-CM | POA: Diagnosis not present

## 2019-04-09 ENCOUNTER — Ambulatory Visit
Admission: RE | Admit: 2019-04-09 | Discharge: 2019-04-09 | Disposition: A | Discharge status: Home / Self Care | Payer: Medicare Other | Source: Ambulatory Visit | Attending: Radiation Oncology | Admitting: Radiation Oncology

## 2019-04-09 ENCOUNTER — Other Ambulatory Visit: Payer: Self-pay

## 2019-04-09 DIAGNOSIS — Z510 Encounter for antineoplastic radiation therapy: Secondary | ICD-10-CM | POA: Diagnosis not present

## 2019-04-09 DIAGNOSIS — C50512 Malignant neoplasm of lower-outer quadrant of left female breast: Secondary | ICD-10-CM | POA: Diagnosis not present

## 2019-04-09 DIAGNOSIS — Z170 Estrogen receptor positive status [ER+]: Secondary | ICD-10-CM | POA: Diagnosis not present

## 2019-04-09 MED ORDER — RADIAPLEXRX EX GEL
Freq: Two times a day (BID) | CUTANEOUS | Status: DC
  Administered 2019-04-09: 15:00:00 via TOPICAL

## 2019-04-10 ENCOUNTER — Other Ambulatory Visit: Payer: Self-pay

## 2019-04-10 ENCOUNTER — Ambulatory Visit
Admission: RE | Admit: 2019-04-10 | Discharge: 2019-04-10 | Disposition: A | Discharge status: Home / Self Care | Payer: Medicare Other | Source: Ambulatory Visit | Attending: Radiation Oncology | Admitting: Radiation Oncology

## 2019-04-10 DIAGNOSIS — C50512 Malignant neoplasm of lower-outer quadrant of left female breast: Secondary | ICD-10-CM | POA: Diagnosis not present

## 2019-04-10 DIAGNOSIS — Z510 Encounter for antineoplastic radiation therapy: Secondary | ICD-10-CM | POA: Diagnosis not present

## 2019-04-10 DIAGNOSIS — Z170 Estrogen receptor positive status [ER+]: Secondary | ICD-10-CM | POA: Diagnosis not present

## 2019-04-11 ENCOUNTER — Other Ambulatory Visit: Payer: Self-pay

## 2019-04-11 ENCOUNTER — Ambulatory Visit
Admission: RE | Admit: 2019-04-11 | Discharge: 2019-04-11 | Disposition: A | Discharge status: Home / Self Care | Payer: Medicare Other | Source: Ambulatory Visit | Attending: Radiation Oncology | Admitting: Radiation Oncology

## 2019-04-11 DIAGNOSIS — C50512 Malignant neoplasm of lower-outer quadrant of left female breast: Secondary | ICD-10-CM | POA: Diagnosis not present

## 2019-04-11 DIAGNOSIS — Z170 Estrogen receptor positive status [ER+]: Secondary | ICD-10-CM | POA: Diagnosis not present

## 2019-04-11 DIAGNOSIS — Z510 Encounter for antineoplastic radiation therapy: Secondary | ICD-10-CM | POA: Diagnosis not present

## 2019-04-12 ENCOUNTER — Ambulatory Visit: Payer: Medicare Other | Admitting: Radiation Oncology

## 2019-04-12 ENCOUNTER — Ambulatory Visit
Admission: RE | Admit: 2019-04-12 | Discharge: 2019-04-12 | Disposition: A | Discharge status: Home / Self Care | Payer: Medicare Other | Source: Ambulatory Visit | Attending: Radiation Oncology | Admitting: Radiation Oncology

## 2019-04-12 ENCOUNTER — Other Ambulatory Visit: Payer: Self-pay

## 2019-04-12 DIAGNOSIS — Z170 Estrogen receptor positive status [ER+]: Secondary | ICD-10-CM | POA: Diagnosis not present

## 2019-04-12 DIAGNOSIS — C50512 Malignant neoplasm of lower-outer quadrant of left female breast: Secondary | ICD-10-CM | POA: Diagnosis not present

## 2019-04-12 DIAGNOSIS — Z510 Encounter for antineoplastic radiation therapy: Secondary | ICD-10-CM | POA: Diagnosis not present

## 2019-04-15 ENCOUNTER — Other Ambulatory Visit: Payer: Self-pay

## 2019-04-15 ENCOUNTER — Ambulatory Visit
Admission: RE | Admit: 2019-04-15 | Discharge: 2019-04-15 | Disposition: A | Discharge status: Home / Self Care | Payer: Medicare Other | Source: Ambulatory Visit | Attending: Radiation Oncology | Admitting: Radiation Oncology

## 2019-04-15 DIAGNOSIS — Z170 Estrogen receptor positive status [ER+]: Secondary | ICD-10-CM | POA: Diagnosis not present

## 2019-04-15 DIAGNOSIS — C50512 Malignant neoplasm of lower-outer quadrant of left female breast: Secondary | ICD-10-CM | POA: Diagnosis not present

## 2019-04-15 DIAGNOSIS — Z510 Encounter for antineoplastic radiation therapy: Secondary | ICD-10-CM | POA: Diagnosis not present

## 2019-04-16 ENCOUNTER — Ambulatory Visit
Admission: RE | Admit: 2019-04-16 | Discharge: 2019-04-16 | Disposition: A | Discharge status: Home / Self Care | Payer: Medicare Other | Source: Ambulatory Visit | Attending: Radiation Oncology | Admitting: Radiation Oncology

## 2019-04-16 ENCOUNTER — Other Ambulatory Visit: Payer: Self-pay

## 2019-04-16 DIAGNOSIS — C50512 Malignant neoplasm of lower-outer quadrant of left female breast: Secondary | ICD-10-CM | POA: Diagnosis not present

## 2019-04-16 DIAGNOSIS — Z170 Estrogen receptor positive status [ER+]: Secondary | ICD-10-CM | POA: Diagnosis not present

## 2019-04-16 DIAGNOSIS — Z510 Encounter for antineoplastic radiation therapy: Secondary | ICD-10-CM | POA: Diagnosis not present

## 2019-04-17 ENCOUNTER — Ambulatory Visit
Admission: RE | Admit: 2019-04-17 | Discharge: 2019-04-17 | Disposition: A | Discharge status: Home / Self Care | Payer: Medicare Other | Source: Ambulatory Visit | Attending: Radiation Oncology | Admitting: Radiation Oncology

## 2019-04-17 ENCOUNTER — Other Ambulatory Visit: Payer: Self-pay

## 2019-04-17 DIAGNOSIS — Z170 Estrogen receptor positive status [ER+]: Secondary | ICD-10-CM | POA: Diagnosis not present

## 2019-04-17 DIAGNOSIS — C50512 Malignant neoplasm of lower-outer quadrant of left female breast: Secondary | ICD-10-CM | POA: Diagnosis not present

## 2019-04-17 DIAGNOSIS — Z510 Encounter for antineoplastic radiation therapy: Secondary | ICD-10-CM | POA: Diagnosis not present

## 2019-04-18 ENCOUNTER — Ambulatory Visit
Admission: RE | Admit: 2019-04-18 | Discharge: 2019-04-18 | Disposition: A | Discharge status: Home / Self Care | Payer: Medicare Other | Source: Ambulatory Visit | Attending: Radiation Oncology | Admitting: Radiation Oncology

## 2019-04-18 ENCOUNTER — Other Ambulatory Visit: Payer: Self-pay

## 2019-04-18 DIAGNOSIS — Z510 Encounter for antineoplastic radiation therapy: Secondary | ICD-10-CM | POA: Diagnosis not present

## 2019-04-18 DIAGNOSIS — C50512 Malignant neoplasm of lower-outer quadrant of left female breast: Secondary | ICD-10-CM | POA: Diagnosis not present

## 2019-04-18 DIAGNOSIS — Z170 Estrogen receptor positive status [ER+]: Secondary | ICD-10-CM | POA: Diagnosis not present

## 2019-04-19 ENCOUNTER — Ambulatory Visit
Admission: RE | Admit: 2019-04-19 | Discharge: 2019-04-19 | Disposition: A | Discharge status: Home / Self Care | Payer: Medicare Other | Source: Ambulatory Visit | Attending: Radiation Oncology | Admitting: Radiation Oncology

## 2019-04-19 ENCOUNTER — Other Ambulatory Visit: Payer: Self-pay

## 2019-04-19 ENCOUNTER — Encounter: Payer: Self-pay | Admitting: Radiation Oncology

## 2019-04-19 DIAGNOSIS — Z170 Estrogen receptor positive status [ER+]: Secondary | ICD-10-CM | POA: Diagnosis not present

## 2019-04-19 DIAGNOSIS — C50512 Malignant neoplasm of lower-outer quadrant of left female breast: Secondary | ICD-10-CM | POA: Diagnosis not present

## 2019-04-19 DIAGNOSIS — Z510 Encounter for antineoplastic radiation therapy: Secondary | ICD-10-CM | POA: Diagnosis not present

## 2019-04-22 ENCOUNTER — Ambulatory Visit
Admission: RE | Admit: 2019-04-22 | Discharge: 2019-04-22 | Disposition: A | Discharge status: Home / Self Care | Payer: Medicare Other | Source: Ambulatory Visit | Attending: Radiation Oncology | Admitting: Radiation Oncology

## 2019-04-22 ENCOUNTER — Other Ambulatory Visit: Payer: Self-pay

## 2019-04-22 DIAGNOSIS — Z510 Encounter for antineoplastic radiation therapy: Secondary | ICD-10-CM | POA: Insufficient documentation

## 2019-04-22 DIAGNOSIS — C50512 Malignant neoplasm of lower-outer quadrant of left female breast: Secondary | ICD-10-CM | POA: Diagnosis not present

## 2019-04-22 DIAGNOSIS — Z170 Estrogen receptor positive status [ER+]: Secondary | ICD-10-CM | POA: Insufficient documentation

## 2019-04-22 DIAGNOSIS — C50912 Malignant neoplasm of unspecified site of left female breast: Secondary | ICD-10-CM

## 2019-04-22 MED ORDER — RADIAPLEXRX EX GEL: Freq: Once | CUTANEOUS | Status: DC

## 2019-04-23 ENCOUNTER — Other Ambulatory Visit: Payer: Self-pay

## 2019-04-23 ENCOUNTER — Ambulatory Visit: Payer: Medicare Other

## 2019-04-23 ENCOUNTER — Ambulatory Visit
Admission: RE | Admit: 2019-04-23 | Discharge: 2019-04-23 | Disposition: A | Discharge status: Home / Self Care | Payer: Medicare Other | Source: Ambulatory Visit | Attending: Radiation Oncology | Admitting: Radiation Oncology

## 2019-04-23 DIAGNOSIS — Z170 Estrogen receptor positive status [ER+]: Secondary | ICD-10-CM | POA: Diagnosis not present

## 2019-04-23 DIAGNOSIS — Z510 Encounter for antineoplastic radiation therapy: Secondary | ICD-10-CM | POA: Diagnosis not present

## 2019-04-23 DIAGNOSIS — C50512 Malignant neoplasm of lower-outer quadrant of left female breast: Secondary | ICD-10-CM | POA: Diagnosis not present

## 2019-04-24 ENCOUNTER — Ambulatory Visit
Admission: RE | Admit: 2019-04-24 | Discharge: 2019-04-24 | Disposition: A | Discharge status: Home / Self Care | Payer: Medicare Other | Source: Ambulatory Visit | Attending: Radiation Oncology | Admitting: Radiation Oncology

## 2019-04-24 ENCOUNTER — Encounter: Payer: Self-pay | Admitting: *Deleted

## 2019-04-24 ENCOUNTER — Other Ambulatory Visit: Payer: Self-pay

## 2019-04-24 ENCOUNTER — Encounter: Payer: Self-pay | Admitting: Radiation Oncology

## 2019-04-24 DIAGNOSIS — Z510 Encounter for antineoplastic radiation therapy: Secondary | ICD-10-CM | POA: Diagnosis not present

## 2019-04-24 DIAGNOSIS — C50512 Malignant neoplasm of lower-outer quadrant of left female breast: Secondary | ICD-10-CM | POA: Diagnosis not present

## 2019-04-24 DIAGNOSIS — Z170 Estrogen receptor positive status [ER+]: Secondary | ICD-10-CM | POA: Diagnosis not present

## 2019-05-03 ENCOUNTER — Telehealth: Payer: Self-pay

## 2019-05-03 NOTE — Telephone Encounter (Signed)
Husband answered. Reports pt unable to come to phone at this time and will have pt call back to office when available.

## 2019-05-03 NOTE — Telephone Encounter (Signed)
Copied from [place] [id number]. Topic: General - Other >> May 02, 2019 11:52 AM Rainey Pines A wrote: Patient would like to speak with the nurse that we be advising her during her AWV scheduled on 08/18

## 2019-05-06 NOTE — Progress Notes (Deleted)
Virtual Visit via Video Note  I connected with patient on 05/07/19 at  2:00 PM EDT by audio enabled telemedicine application and verified that I am speaking with the correct person using two identifiers.   THIS ENCOUNTER IS A VIRTUAL VISIT DUE TO COVID-19 - PATIENT WAS NOT SEEN IN THE OFFICE. PATIENT HAS CONSENTED TO VIRTUAL VISIT / TELEMEDICINE VISIT   Location of patient: home  Location of provider: office  I discussed the limitations of evaluation and management by telemedicine and the availability of in person appointments. The patient expressed understanding and agreed to proceed.   Subjective:   Amanda [name] is a 73 y.o. female who presents for Medicare Annual (Subsequent) preventive examination.  Review of Systems: No ROS.  Medicare Wellness Virtual Visit.  Visual/audio telehealth visit, UTA vital signs.   See social history for additional risk factors.   Sleep patterns: Home Safety/Smoke Alarms: Feels safe in home. Smoke alarms in place.  Lives with husband in 2 story home. 6 steps going into home. Walk in shower. Grab rails present.   Female:          Mammo- 08/20/18      Dexa scan-        CCS-     Objective:     Vitals: There were no vitals taken for this visit.  There is no height or weight on file to calculate BMI.  Advanced Directives 11/27/2018 10/31/2018 05/03/2018 08/15/2017 05/02/2017 03/04/2016 01/11/2016  Does Patient Have a Medical Advance Directive? Yes No Yes Yes Yes Yes Yes  Type of Paramedic of [place];Living will - [place];Living will Living will;Healthcare Power of [place];Living will [place];Living will Living will  Does patient want to make changes to medical advance directive? - - - - - No - Patient declined No - Patient declined  Copy of [place] in Chart? No - copy requested - No - copy requested No - copy requested No - copy  requested No - copy requested No - copy requested  Would patient like information on creating a medical advance directive? - No - Patient declined - - - - -    Tobacco Social History   Tobacco Use  Smoking Status Never Smoker  Smokeless Tobacco Never Used     Counseling given: Not Answered   Clinical Intake:                       Past Medical History:  Diagnosis Date  . Breast cancer, left breast ([place])   . Frequency of urination   . GAD (generalized anxiety disorder)   . Osteopenia   . Parkinson disease [facility])    neurologist-  dr Arvella Nigh--  right side predominant  . Prolapse of vaginal wall   . Urgency of urination   . Wears glasses    Past Surgical History:  Procedure Laterality Date  . ABDOMINAL HYSTERECTOMY    . BREAST BIOPSY Left 07/2017   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  . COLONOSCOPY  last one 2007  . CYSTOCELE REPAIR N/A 01/11/2016   Procedure: ANTERIOR REPAIR (CYSTOCELE) COLOPLAST FASCIA  SACROSPINOUS FIXATION;  Surgeon: Carolan Clines, MD;  Location: [facility];  Service: Urology;  Laterality: N/A;  . CYSTOSCOPY W/ URETERAL STENT PLACEMENT Bilateral 01/11/2016   Procedure: CYSTOSCOPY WITH BILATERAL RETROGRADE PYELOGRAM/URETERAL STENT PLACEMENT;  Surgeon: Carolan Clines, MD;  Location: [facility];  Service: Urology;  Laterality: Bilateral;  . ESOPHAGOGASTRODUODENOSCOPY  2007  . EXTERNAL EAR SURGERY Bilateral age 55  . LAPAROSCOPIC OVARIAN CYSTECTOMY  March 2008   unilateral  . PUBOVAGINAL SLING N/A 01/11/2016   Procedure:  ALTIS SLING SINGLE INCISION;  Surgeon: Carolan Clines, MD;  Location: [facility];  Service: Urology;  Laterality: N/A;  . RECTOCELE REPAIR N/A 01/11/2016   Procedure: POSTERIOR REPAIR (RECTOCELE), PERINEAPLASTY;  Surgeon: Dian Queen, MD;  Location: [facility];  Service: Gynecology;  Laterality: N/A;  . SIMPLE MASTECTOMY WITH AXILLARY  SENTINEL NODE BIOPSY Left 11/08/2018   Procedure: LEFT SIMPLE MASTECTOMY WITH LEFT  AXILLARY SENTINEL NODE MAPPING;  Surgeon: Erroll Luna, MD;  Location: [facility];  Service: General;  Laterality: Left;  Marland Kitchen VAGINAL HYSTERECTOMY N/A 01/11/2016   Procedure: TOTAL VAGINAL HYSTERECTOMY ;  Surgeon: Dian Queen, MD;  Location[facility];  Service: Gynecology;  Laterality: N/A;   Family History  Problem Relation Age of Onset  . Heart disease Mother        enlarged heart  . Cancer Father        pancreatic  . COPD Sister   . Pulmonary embolism Sister   . Edema Sister        Ankles  . Heart disease Brother   . Polycystic kidney disease Son    Social History   Socioeconomic History  . Marital status: Married    Spouse name: Chrissie Noa  . Number of children: 3  . Years of education: College  . Highest education level: Not on file  Occupational History  . Occupation: retired  Scientific laboratory technician  . Financial resource strain: Not on file  . Food insecurity    Worry: Not on file    Inability: Not on file  . Transportation needs    Medical: No    Non-medical: No  Tobacco Use  . Smoking status: Never Smoker  . Smokeless tobacco: Never Used  Substance and Sexual Activity  . Alcohol use: No  . Drug use: No  . Sexual activity: Not on file  Lifestyle  . Physical activity    Days per week: Not on file    Minutes per session: Not on file  . Stress: Not on file  Relationships  . Social Herbalist on phone: Not on file    Gets together: Not on file    Attends religious service: Not on file    Active member of club or organization: Not on file    Attends meetings of clubs or organizations: Not on file    Relationship status: Not on file  Other Topics Concern  . Not on file  Social History Narrative   Pt lives at home with family.   Caffeine Use: some, occasionally    Outpatient Encounter Medications as of 05/07/2019  Medication Sig  . AMBULATORY NON FORMULARY  MEDICATION Medication Name: Compression Stockings 20-57mmHg  1 pair  . aspirin EC 325 MG tablet Take 325 mg by mouth daily as needed for moderate pain.  Marland Kitchen b complex vitamins tablet Take 1 tablet by mouth daily. MEGA FOOD BALANCE SOURCE  . CALCIUM-MAGNESIUM-VITAMIN D PO Take 2 tablets by mouth 2 (two) times daily.  . carbidopa-levodopa (SINEMET IR) 25-100 MG tablet Take 1 tablet by mouth 2 (two) times daily.  . Cholecalciferol (VITAMIN D3) 1000 UNIT/SPRAY LIQD Take 2 sprays by mouth 2 (two) times daily.   Marland Kitchen letrozole (FEMARA) 2.5 MG tablet TAKE 1  TABLET BY MOUTH EVERY DAY (Patient taking differently: Take 2.5 mg by mouth at bedtime. )  . pramipexole (MIRAPEX) 1 MG tablet Take 1 tablet by mouth 3 (three) times daily.  . Probiotic Product (PROBIOTIC DAILY) CAPS Take 1 capsule by mouth daily as needed (indigestion).   . Propylene Glycol-Glycerin (SOOTHE OP) Place 2 drops into both eyes daily as needed (dry eyes).  . sertraline (ZOLOFT) 50 MG tablet Take 50 mg by mouth at bedtime.   . Zinc Gluconate (COLD-EEZE) 13.3 MG LOZG Use as directed 13.3 mg in the mouth or throat daily as needed (cold symptoms).   No facility-administered encounter medications on file as of 05/07/2019.     Activities of Daily Living In your present state of health, do you have any difficulty performing the following activities: 10/31/2018  Hearing? N  Vision? N  Difficulty concentrating or making decisions? N  Walking or climbing stairs? N  Dressing or bathing? N  Doing errands, shopping? N  Some recent data might be hidden    Patient Care Team: Carollee Herter, Alferd Apa, DO as PCP - General Star Age, MD as Consulting Physician (Neurology) Dian Queen, MD as Consulting Physician (Obstetrics and Gynecology) Lyndal Pulley, DO as Consulting Physician (Family Medicine) Eldridge Abrahams, MD as Consulting Physician (Neurology) Carolan Clines, MD (Inactive) as Consulting Physician (Urology) Jola Schmidt,  MD as Consulting Physician (Ophthalmology)    Assessment:   This is a routine wellness examination for [place]. Physical assessment deferred to PCP.  Exercise Activities and Dietary recommendations   Diet (meal preparation, eat out, water intake, caffeinated beverages, dairy products, fruits and vegetables): {Desc; diets:16563} Breakfast: Lunch:  Dinner:      Goals    . Increase physical activity (pt-stated)     Starting 05/02/2017, I will start doing Parkinson's exercises with my husband and using the stationary bike 2-3 times weekly.       Fall Risk Fall Risk  05/03/2018 08/15/2017 05/02/2017 03/04/2016 01/04/2016  Falls in the past year? Yes No No No No  Number falls in past yr: 2 or more - - - -  Injury with Fall? Yes - - - -  Comment - - - - -  Risk Factor Category  High Fall Risk - - - -  Risk for fall due to : Impaired balance/gait - Impaired balance/gait;Impaired mobility;Other (Comment) - -  Risk for fall due to: Comment parkinsons - Parkinson's Disease - -  Follow up Education provided;Falls prevention discussed - - - -     Depression Screen PHQ 2/9 Scores 05/03/2018 08/15/2017 05/02/2017 03/04/2016  PHQ - 2 Score 0 0 1 0  PHQ- 9 Score - - - -     Cognitive Function Ad8 score reviewed for issues:  Issues making decisions:  Less interest in hobbies / activities:  Repeats questions, stories (family complaining):  Trouble using ordinary gadgets (microwave, computer, phone):  Forgets the month or year:   Mismanaging finances:   Remembering appts:  Daily problems with thinking and/or memory: Ad8 score is=     MMSE - Mini Mental State Exam 05/03/2018 05/02/2017  Orientation to time 5 4  Orientation to time comments - Disoriented to date, off by 1 day.  Orientation to Place 5 5  Registration 3 3  Attention/ Calculation 5 5  Recall 2 3  Language- name 2 objects 2 2  Language- repeat 1 1  Language- follow 3 step command 3 3  Language- read & follow  direction 1 1  Write a sentence 1 1  Copy design - 1  Total score - 29        Immunization History  Administered Date(s) Administered  . Influenza Split 08/17/2011  . Influenza Whole 10/21/2009  . Influenza, High Dose Seasonal PF 05/18/2017, 08/02/2018  . Influenza,inj,Quad PF,6+ Mos 07/12/2013  . Pneumococcal Conjugate-13 03/05/2015  . Pneumococcal Polysaccharide-23 03/04/2016  . Td 04/22/2004, 10/21/2009  . Tdap 05/09/2016  . Zoster 06/26/2008   Screening Tests Health Maintenance  Topic Date Due  . Hepatitis C Screening  03-26-1946  . Fecal DNA (Cologuard)  05/22/1997  . MAMMOGRAM  07/21/2018  . INFLUENZA VACCINE  04/20/2019  . TETANUS/TDAP  05/09/2026  . DEXA SCAN  Completed  . PNA vac Low Risk Adult  Completed      Plan:   ***   I have personally reviewed and noted the following in the patient's chart:   . Medical and social history . Use of alcohol, tobacco or illicit drugs  . Current medications and supplements . Functional ability and status . Nutritional status . Physical activity . Advanced directives . List of other physicians . Hospitalizations, surgeries, and ER visits in previous 12 months . Vitals . Screenings to include cognitive, depression, and falls . Referrals and appointments  In addition, I have reviewed and discussed with patient certain preventive protocols, quality metrics, and best practice recommendations. A written personalized care plan for preventive services as well as general preventive health recommendations were provided to patient.     Shela Nevin, [place]  05/06/2019

## 2019-05-07 ENCOUNTER — Ambulatory Visit: Payer: Medicare Other | Admitting: *Deleted

## 2019-05-07 ENCOUNTER — Other Ambulatory Visit: Payer: Self-pay

## 2019-05-08 NOTE — Progress Notes (Deleted)
Patient Name: Amanda [name] MRN: [id number] DOB: Aug 01, 1946 Referring Physician: Nicholas Lose (Profile Not Attached) Date of Service: 04/24/2019 [facility], [place]                                                        End Of Treatment Note  Diagnoses: C50.512-Malignant neoplasm of lower-outer quadrant of left female breast Z17.0-Estrogen receptor positive status [ER+]  Cancer Staging: Cancer Staging Malignant neoplasm of lower-outer quadrant of left breast of female, estrogen receptor positive ([place]) Staging form: Breast, AJCC 8th Edition - Clinical: Stage IIA (cT3, cN0, cM0, G2, ER: Positive, PR: Positive, HER2: Negative) - Signed by Eppie Gibson, MD on 08/15/2017 - Pathologic stage from 11/20/2018: No Stage Recommended (ypT2, pN0, cM0, G2, ER+, PR-, HER2-, Oncotype DX score: 10) - Signed by Nicholas Lose, MD on 11/20/2018   Intent: Curative  Radiation Treatment Dates: 03/12/2019 through 04/24/2019 Site Technique Total Dose Dose per Fx Completed Fx Beam Energies  Breast: CW_Lt 3D 50/50 2 25/25 6X, 10X  Breast: CW_Lt_Bst Electron 10/10 2 5/5 6E   Narrative: The patient tolerated radiation therapy relatively well.   Plan: The patient will follow-up with radiation oncology in 37month     SEppie Gibson MD

## 2019-05-08 NOTE — Progress Notes (Addendum)
Patient Name: Amanda [name] MRN: [id number] DOB: 10-Jan-1946 Referring Physician: Nicholas Lose (Profile Not Attached) Date of Service: 04/24/2019  [facility] [place]                                                        End Of Treatment Note  Diagnoses: C50.512-Malignant neoplasm of lower-outer quadrant of left female breast Z17.0-Estrogen receptor positive status [ER+]  Cancer Staging: Cancer Staging Malignant neoplasm of lower-outer quadrant of left breast of female, estrogen receptor positive ([place]) Staging form: Breast, AJCC 8th Edition - Clinical: Stage IIA (cT3, cN0, cM0, G2, ER: Positive, PR: Positive, HER2: Negative) - Signed by Eppie Gibson, MD on 08/15/2017 - Pathologic stage from 11/20/2018: No Stage Recommended (ypT2, pN0, cM0, G2, ER+, PR-, HER2-, Oncotype DX score: 10) - Signed by Nicholas Lose, MD on 11/20/2018   Intent: Curative  Radiation Treatment Dates: 03/12/2019 through 04/24/2019 Site Technique Total Dose (Gy) Dose per Fx Completed Fx Beam Energies  Breast: CW_Lt 3D 50 2 25/25 6X, 10X  Breast: CW_Lt_Bst Electron 10 2 5/5 6E   Narrative: The patient tolerated radiation therapy relatively well.   Plan: The patient will follow-up with radiation oncology in 26month     SEppie Gibson MD

## 2019-05-10 ENCOUNTER — Telehealth: Payer: Self-pay | Admitting: Adult Health

## 2019-05-10 NOTE — Telephone Encounter (Signed)
I LEFT patient a message regarding phone visit for 9/10

## 2019-05-13 NOTE — Progress Notes (Signed)
Virtual Visit via Video Note  I connected with patient on 05/14/19 at  1:00 PM EDT by audio enabled telemedicine application and verified that I am speaking with the correct person using two identifiers.   THIS ENCOUNTER IS A VIRTUAL VISIT DUE TO COVID-19 - PATIENT WAS NOT SEEN IN THE OFFICE. PATIENT HAS CONSENTED TO VIRTUAL VISIT / TELEMEDICINE VISIT   Location of patient: home  Location of provider: office  I discussed the limitations of evaluation and management by telemedicine and the availability of in person appointments. The patient expressed understanding and agreed to proceed.   Subjective:   Amanda [name] is a 73 y.o. female who presents for Medicare Annual (Subsequent) preventive examination.  Cardiac Risk Factors include: advanced age (>31men, >70 women)  Home Safety/Smoke Alarms: Feels safe in home. Smoke alarms in place.  Lives with husband in 2 story home. Master on first. 6 steps into home. No issues with stairs. Handrails in place. Walk-in shower.   Female:     Mammo-  08/20/18     Dexa scan- pt reports she is utd. States she does w/ Dr.Grewall. Report requested.        CCS- declines  Objective:     Advanced Directives 05/14/2019 11/27/2018 10/31/2018 05/03/2018 08/15/2017 05/02/2017 03/04/2016  Does Patient Have a Medical Advance Directive? Yes Yes No Yes Yes Yes Yes  Type of Paramedic of [place];Living will [place];Living will - [place];Living will Living will;Healthcare Power of [place];Living will [place];Living will  Does patient want to make changes to medical advance directive? No - Patient declined - - - - - No - Patient declined  Copy of [place] in Chart? No - copy requested No - copy requested - No - copy requested No - copy requested No - copy requested No - copy requested  Would patient like information on creating a  medical advance directive? - - No - Patient declined - - - -    Tobacco Social History   Tobacco Use  Smoking Status Never Smoker  Smokeless Tobacco Never Used     Counseling given: Not Answered   Clinical Intake:     Pain : No/denies pain                 Past Medical History:  Diagnosis Date  . Breast cancer, left breast ([place])   . Frequency of urination   . GAD (generalized anxiety disorder)   . Osteopenia   . Parkinson disease [facility])    neurologist-  dr Arvella Nigh--  right side predominant  . Parkinson disease ([place])   . Prolapse of vaginal wall   . Urgency of urination   . Wears glasses    Past Surgical History:  Procedure Laterality Date  . ABDOMINAL HYSTERECTOMY    . BREAST BIOPSY Left 07/2017   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  . COLONOSCOPY  last one 2007  . CYSTOCELE REPAIR N/A 01/11/2016   Procedure: ANTERIOR REPAIR (CYSTOCELE) COLOPLAST FASCIA  SACROSPINOUS FIXATION;  Surgeon: Carolan Clines, MD;  Location: [place];  Service: Urology;  Laterality: N/A;  . CYSTOSCOPY W/ URETERAL STENT PLACEMENT Bilateral 01/11/2016   Procedure: CYSTOSCOPY WITH BILATERAL RETROGRADE PYELOGRAM/URETERAL STENT PLACEMENT;  Surgeon: Carolan Clines, MD;  Location: [place];  Service: Urology;  Laterality: Bilateral;  . ESOPHAGOGASTRODUODENOSCOPY  2007  . EXTERNAL EAR SURGERY Bilateral age 75  .  LAPAROSCOPIC OVARIAN CYSTECTOMY  March 2008   unilateral  . PUBOVAGINAL SLING N/A 01/11/2016   Procedure:  ALTIS SLING SINGLE INCISION;  Surgeon: Carolan Clines, MD;  Location[facility];  Service: Urology;  Laterality: N/A;  . RECTOCELE REPAIR N/A 01/11/2016   Procedure: POSTERIOR REPAIR (RECTOCELE), PERINEAPLASTY;  Surgeon: Dian Queen, MD;  Location: [place];  Service: Gynecology;  Laterality: N/A;  . SIMPLE MASTECTOMY WITH AXILLARY SENTINEL NODE BIOPSY Left 11/08/2018   Procedure:  LEFT SIMPLE MASTECTOMY WITH LEFT  AXILLARY SENTINEL NODE MAPPING;  Surgeon: Erroll Luna, MD;  Location: [place];  Service: General;  Laterality: Left;  Marland Kitchen VAGINAL HYSTERECTOMY N/A 01/11/2016   Procedure: TOTAL VAGINAL HYSTERECTOMY ;  Surgeon: Dian Queen, MD;  Location[facility];  Service: Gynecology;  Laterality: N/A;   Family History  Problem Relation Age of Onset  . Heart disease Mother        enlarged heart  . Cancer Father        pancreatic  . COPD Sister   . Pulmonary embolism Sister   . Edema Sister        Ankles  . Heart disease Brother   . Polycystic kidney disease Son    Social History   Socioeconomic History  . Marital status: Married    Spouse name: Chrissie Noa  . Number of children: 3  . Years of education: College  . Highest education level: Not on file  Occupational History  . Occupation: retired  Scientific laboratory technician  . Financial resource strain: Not on file  . Food insecurity    Worry: Not on file    Inability: Not on file  . Transportation needs    Medical: No    Non-medical: No  Tobacco Use  . Smoking status: Never Smoker  . Smokeless tobacco: Never Used  Substance and Sexual Activity  . Alcohol use: No  . Drug use: No  . Sexual activity: Not on file  Lifestyle  . Physical activity    Days per week: Not on file    Minutes per session: Not on file  . Stress: Not on file  Relationships  . Social Herbalist on phone: Not on file    Gets together: Not on file    Attends religious service: Not on file    Active member of club or organization: Not on file    Attends meetings of clubs or organizations: Not on file    Relationship status: Not on file  Other Topics Concern  . Not on file  Social History Narrative   Pt lives at home with family.   Caffeine Use: some, occasionally    Outpatient Encounter Medications as of 05/14/2019  Medication Sig  . AMBULATORY NON FORMULARY MEDICATION Medication Name: Compression Stockings  20-63mmHg  1 pair  . b complex vitamins tablet Take 1 tablet by mouth daily. MEGA FOOD BALANCE SOURCE  . CALCIUM-MAGNESIUM-VITAMIN D PO Take 2 tablets by mouth 2 (two) times daily.  . carbidopa-levodopa (SINEMET IR) 25-100 MG tablet Take 1 tablet by mouth 2 (two) times daily.  . Cholecalciferol (VITAMIN D3) 1000 UNIT/SPRAY LIQD Take 2 sprays by mouth 2 (two) times daily.   Marland Kitchen letrozole (FEMARA) 2.5 MG tablet TAKE 1 TABLET BY MOUTH EVERY DAY (Patient taking differently: Take 2.5 mg by mouth at bedtime. )  . pramipexole (MIRAPEX) 1 MG tablet Take 1 tablet by mouth 3 (three) times daily.  . Probiotic Product (PROBIOTIC DAILY) CAPS Take  1 capsule by mouth daily as needed (indigestion).   . Propylene Glycol-Glycerin (SOOTHE OP) Place 2 drops into both eyes daily as needed (dry eyes).  . sertraline (ZOLOFT) 50 MG tablet Take 50 mg by mouth at bedtime.   . Zinc Gluconate (COLD-EEZE) 13.3 MG LOZG Use as directed 13.3 mg in the mouth or throat daily as needed (cold symptoms).  Marland Kitchen aspirin EC 325 MG tablet Take 325 mg by mouth daily as needed for moderate pain.   No facility-administered encounter medications on file as of 05/14/2019.     Activities of Daily Living In your present state of health, do you have any difficulty performing the following activities: 05/14/2019 10/31/2018  Hearing? N N  Vision? N N  Difficulty concentrating or making decisions? N N  Walking or climbing stairs? N N  Dressing or bathing? N N  Doing errands, shopping? Y N  Preparing Food and eating ? N -  Using the Toilet? N -  In the past six months, have you accidently leaked urine? N -  Do you have problems with loss of bowel control? N -  Managing your Medications? N -  Managing your Finances? Y -  Housekeeping or managing your Housekeeping? N -  Some recent data might be hidden    Patient Care Team: Carollee Herter, Alferd Apa, DO as PCP - General Star Age, MD as Consulting Physician (Neurology) Dian Queen, MD  as Consulting Physician (Obstetrics and Gynecology) Lyndal Pulley, DO as Consulting Physician (Family Medicine) Eldridge Abrahams, MD as Consulting Physician (Neurology) Carolan Clines, MD (Inactive) as Consulting Physician (Urology) Jola Schmidt, MD as Consulting Physician (Ophthalmology) Nicholas Lose, MD as Consulting Physician (Hematology and Oncology) Eppie Gibson, MD as Attending Physician (Radiation Oncology) Erroll Luna, MD as Consulting Physician (General Surgery)    Assessment:   This is a routine wellness examination for [place]. Physical assessment deferred to PCP.  Exercise Activities and Dietary recommendations Current Exercise Habits: The patient does not participate in regular exercise at present, Exercise limited by: neurologic condition(s)   Diet (meal preparation, eat out, water intake, caffeinated beverages, dairy products, fruits and vegetables): 24 hr recall Breakfast: cereal w/ fruit and nuts. Lunch: skipped Dinner: cubed steak, rice, green beans  Goals    . Increase physical activity (pt-stated)     Starting 05/02/2017, I will start doing Parkinson's exercises with my husband and using the stationary bike 2-3 times weekly.       Fall Risk Fall Risk  05/14/2019 05/03/2018 08/15/2017 05/02/2017 03/04/2016  Falls in the past year? 1 Yes No No No  Number falls in past yr: 1 2 or more - - -  Injury with Fall? 0 Yes - - -  Comment - - - - -  Risk Factor Category  - High Fall Risk - - -  Risk for fall due to : Impaired balance/gait Impaired balance/gait - Impaired balance/gait;Impaired mobility;Other (Comment) -  Risk for fall due to: Comment - parkinsons - Parkinson's Disease -  Follow up Education provided;Falls prevention discussed Education provided;Falls prevention discussed - - -    Depression Screen PHQ 2/9 Scores 05/14/2019 05/03/2018 08/15/2017 05/02/2017  PHQ - 2 Score 0 0 0 1  PHQ- 9 Score - - - -     Cognitive Function MMSE - Mini  Mental State Exam 05/03/2018 05/02/2017  Orientation to time 5 4  Orientation to time comments - Disoriented to date, off by 1 day.  Orientation to Place 5 5  Registration 3  3  Attention/ Calculation 5 5  Recall 2 3  Language- name 2 objects 2 2  Language- repeat 1 1  Language- follow 3 step command 3 3  Language- read & follow direction 1 1  Write a sentence 1 1  Copy design - 1  Total score - 29        Immunization History  Administered Date(s) Administered  . Influenza Split 08/17/2011  . Influenza Whole 10/21/2009  . Influenza, High Dose Seasonal PF 05/18/2017, 08/02/2018  . Influenza,inj,Quad PF,6+ Mos 07/12/2013  . Pneumococcal Conjugate-13 03/05/2015  . Pneumococcal Polysaccharide-23 03/04/2016  . Td 04/22/2004, 10/21/2009  . Tdap 05/09/2016  . Zoster 06/26/2008    Screening Tests Health Maintenance  Topic Date Due  . Hepatitis C Screening  1946-03-30  . Fecal DNA (Cologuard)  05/22/1997  . MAMMOGRAM  07/21/2018  . INFLUENZA VACCINE  04/20/2019  . TETANUS/TDAP  05/09/2026  . DEXA SCAN  Completed  . PNA vac Low Risk Adult  Completed      Plan:    Please schedule your next medicare wellness visit with me in 1 yr.  Continue to eat heart healthy diet (full of fruits, vegetables, whole grains, lean protein, water--limit salt, fat, and sugar intake) and increase physical activity as tolerated.  Continue doing brain stimulating activities (puzzles, reading, adult coloring books, staying active) to keep memory sharp.   Bring a copy of your living will and/or healthcare power of attorney to your next office visit.    I have personally reviewed and noted the following in the patient's chart:   . Medical and social history . Use of alcohol, tobacco or illicit drugs  . Current medications and supplements . Functional ability and status . Nutritional status . Physical activity . Advanced directives . List of other physicians . Hospitalizations, surgeries, and  ER visits in previous 12 months . Vitals . Screenings to include cognitive, depression, and falls . Referrals and appointments  In addition, I have reviewed and discussed with patient certain preventive protocols, quality metrics, and best practice recommendations. A written personalized care plan for preventive services as well as general preventive health recommendations were provided to patient.     Naaman Plummer [place], [place]  05/14/2019

## 2019-05-14 ENCOUNTER — Other Ambulatory Visit: Payer: Self-pay

## 2019-05-14 ENCOUNTER — Encounter: Payer: Self-pay | Admitting: *Deleted

## 2019-05-14 ENCOUNTER — Ambulatory Visit (INDEPENDENT_AMBULATORY_CARE_PROVIDER_SITE_OTHER): Payer: Medicare Other | Admitting: *Deleted

## 2019-05-14 DIAGNOSIS — Z0000 Encounter for general adult medical examination without abnormal findings: Secondary | ICD-10-CM

## 2019-05-14 NOTE — Patient Instructions (Signed)
Please schedule your next medicare wellness visit with me in 1 yr.  Continue to eat heart healthy diet (full of fruits, vegetables, whole grains, lean protein, water--limit salt, fat, and sugar intake) and increase physical activity as tolerated.  Continue doing brain stimulating activities (puzzles, reading, adult coloring books, staying active) to keep memory sharp.   Bring a copy of your living will and/or healthcare power of attorney to your next office visit.   Amanda [name] , Thank you for taking time to come for your Medicare Wellness Visit. I appreciate your ongoing commitment to your health goals. Please review the following plan we discussed and let me know if I can assist you in the future.   These are the goals we discussed: Goals    . Increase physical activity (pt-stated)     Starting 05/02/2017, I will start doing Parkinson's exercises with my husband and using the stationary bike 2-3 times weekly.       This is a list of the screening recommended for you and due dates:  Health Maintenance  Topic Date Due  .  Hepatitis C: One time screening is recommended by Center for Disease Control  (CDC) for  adults born from [birth year] through 1964.   Mar 17, 1946  . Cologuard (Stool DNA test)  05/22/1997  . Mammogram  07/21/2018  . Flu Shot  04/20/2019  . Tetanus Vaccine  05/09/2026  . DEXA scan (bone density measurement)  Completed  . Pneumonia vaccines  Completed    Health Maintenance After Age 58 After age 67, you are at a higher risk for certain long-term diseases and infections as well as injuries from falls. Falls are a major cause of broken bones and head injuries in people who are older than age 14. Getting regular preventive care can help to keep you healthy and well. Preventive care includes getting regular testing and making lifestyle changes as recommended by your health care provider. Talk with your health care provider about:  Which screenings and tests you should have. A  screening is a test that checks for a disease when you have no symptoms.  A diet and exercise plan that is right for you. What should I know about screenings and tests to prevent falls? Screening and testing are the best ways to find a health problem early. Early diagnosis and treatment give you the best chance of managing medical conditions that are common after age 81. Certain conditions and lifestyle choices may make you more likely to have a fall. Your health care provider may recommend:  Regular vision checks. Poor vision and conditions such as cataracts can make you more likely to have a fall. If you wear glasses, make sure to get your prescription updated if your vision changes.  Medicine review. Work with your health care provider to regularly review all of the medicines you are taking, including over-the-counter medicines. Ask your health care provider about any side effects that may make you more likely to have a fall. Tell your health care provider if any medicines that you take make you feel dizzy or sleepy.  Osteoporosis screening. Osteoporosis is a condition that causes the bones to get weaker. This can make the bones weak and cause them to break more easily.  Blood pressure screening. Blood pressure changes and medicines to control blood pressure can make you feel dizzy.  Strength and balance checks. Your health care provider may recommend certain tests to check your strength and balance while standing, walking, or changing positions.  Foot health exam. Foot pain and numbness, as well as not wearing proper footwear, can make you more likely to have a fall.  Depression screening. You may be more likely to have a fall if you have a fear of falling, feel emotionally low, or feel unable to do activities that you used to do.  Alcohol use screening. Using too much alcohol can affect your balance and may make you more likely to have a fall. What actions can I take to lower my risk of  falls? General instructions  Talk with your health care provider about your risks for falling. Tell your health care provider if: ? You fall. Be sure to tell your health care provider about all falls, even ones that seem minor. ? You feel dizzy, sleepy, or off-balance.  Take over-the-counter and prescription medicines only as told by your health care provider. These include any supplements.  Eat a healthy diet and maintain a healthy weight. A healthy diet includes low-fat dairy products, low-fat (lean) meats, and fiber from whole grains, beans, and lots of fruits and vegetables. Home safety  Remove any tripping hazards, such as rugs, cords, and clutter.  Install safety equipment such as grab bars in bathrooms and safety rails on stairs.  Keep rooms and walkways well-lit. Activity   Follow a regular exercise program to stay fit. This will help you maintain your balance. Ask your health care provider what types of exercise are appropriate for you.  If you need a cane or walker, use it as recommended by your health care provider.  Wear supportive shoes that have nonskid soles. Lifestyle  Do not drink alcohol if your health care provider tells you not to drink.  If you drink alcohol, limit how much you have: ? 0-1 drink a day for women. ? 0-2 drinks a day for men.  Be aware of how much alcohol is in your drink. In the [place] one drink equals one typical bottle of beer (12 oz), one-half glass of wine (5 oz), or one shot of hard liquor (1 oz).  Do not use any products that contain nicotine or tobacco, such as cigarettes and e-cigarettes. If you need help quitting, ask your health care provider. Summary  Having a healthy lifestyle and getting preventive care can help to protect your health and wellness after age 23.  Screening and testing are the best way to find a health problem early and help you avoid having a fall. Early diagnosis and treatment give you the best chance for  managing medical conditions that are more common for people who are older than age 87.  Falls are a major cause of broken bones and head injuries in people who are older than age 21. Take precautions to prevent a fall at home.  Work with your health care provider to learn what changes you can make to improve your health and wellness and to prevent falls. This information is not intended to replace advice given to you by your health care provider. Make sure you discuss any questions you have with your health care provider. Document Released: 07/19/2017 Document Revised: 12/27/2018 Document Reviewed: 07/19/2017 Elsevier Patient Education  2020 [employer].

## 2019-05-16 DIAGNOSIS — H2513 Age-related nuclear cataract, bilateral: Secondary | ICD-10-CM | POA: Diagnosis not present

## 2019-05-20 ENCOUNTER — Ambulatory Visit: Payer: Self-pay | Admitting: Family Medicine

## 2019-05-21 ENCOUNTER — Other Ambulatory Visit: Payer: Self-pay

## 2019-05-21 ENCOUNTER — Encounter: Payer: Self-pay | Admitting: Family Medicine

## 2019-05-21 ENCOUNTER — Ambulatory Visit (INDEPENDENT_AMBULATORY_CARE_PROVIDER_SITE_OTHER): Payer: Medicare Other | Admitting: Family Medicine

## 2019-05-21 DIAGNOSIS — M21619 Bunion of unspecified foot: Secondary | ICD-10-CM | POA: Diagnosis not present

## 2019-05-21 NOTE — Progress Notes (Signed)
Virtual Visit via Video Note  I connected with Amanda [name] on 05/21/19 at  9:20 AM EDT by a video enabled telemedicine application and verified that I am speaking with the correct person using two identifiers.  Location: Patient: home  Provider: office    I discussed the limitations of evaluation and management by telemedicine and the availability of in person appointments. The patient expressed understanding and agreed to proceed.  History of Present Illness: Pt is home with complaints of fungus of toenails and bunion   She saw a foot dr a few years ago but it has gotten worse--- she prefers to see ortho  She tried topical tx for toenails that did not work   She does not want the oral medication and will wait to discuss with ortho   Observations/Objective: There were no vitals filed for this visit.  Pt is in NAD   Assessment and Plan: 1. Bunion Pt requesting referral to ortho for bunion and onychomycosis  - Ambulatory referral to Orthopedic Surgery   Follow Up Instructions:    I discussed the assessment and treatment plan with the patient. The patient was provided an opportunity to ask questions and all were answered. The patient agreed with the plan and demonstrated an understanding of the instructions.   The patient was advised to call back or seek an in-person evaluation if the symptoms worsen or if the condition fails to improve as anticipated.  I provided 15 minutes of non-face-to-face time during this encounter.   Ann Held, DO

## 2019-05-24 ENCOUNTER — Other Ambulatory Visit: Payer: Self-pay

## 2019-05-24 ENCOUNTER — Ambulatory Visit (INDEPENDENT_AMBULATORY_CARE_PROVIDER_SITE_OTHER): Payer: Medicare Other

## 2019-05-24 DIAGNOSIS — Z23 Encounter for immunization: Secondary | ICD-10-CM

## 2019-05-24 NOTE — Progress Notes (Signed)
I called the patient today about her upcoming follow-up appointment in radiation oncology.   Given concerns about the COVID-19 pandemic, I offered a phone assessment with the patient to determine if coming to the clinic was necessary. She accepted.  I let the patient know that I had spoken with Dr. Isidore Moos, and she wanted them to know the importance of washing their hands for at least 20 seconds at a time, especially after going out in public, and before they eat.  Limit going out in public whenever possible. Do not touch your face, unless your hands are clean, such as when bathing. Get plenty of rest, eat well, and stay hydrated.   The patient denies any symptomatic concerns.  Specifically, they report good healing of their skin in the radiation fields.  Skin is intact.    I recommended that she continue skin care by applying oil or lotion with vitamin E to the skin in the radiation fields, BID, for 2 more months.  Continue follow-up with medical oncology - follow-up is scheduled on 05/30/19 with Wilber Bihari from [facility] .  I explained that yearly mammograms are important for patients with intact breast tissue, and physical exams are important after mastectomy for patients that cannot undergo mammography.  I encouraged her to call if she had further questions or concerns about her healing. Otherwise, she will follow-up PRN in radiation oncology. Patient is pleased with this plan, and we will cancel her upcoming follow-up to reduce the risk of COVID-19 transmission.

## 2019-05-28 ENCOUNTER — Inpatient Hospital Stay
Admission: RE | Admit: 2019-05-28 | Discharge: 2019-05-28 | Disposition: A | Discharge status: Home / Self Care | Payer: Self-pay | Source: Ambulatory Visit | Attending: Radiation Oncology | Admitting: Radiation Oncology

## 2019-05-30 ENCOUNTER — Encounter: Payer: Self-pay | Admitting: Adult Health

## 2019-05-30 ENCOUNTER — Inpatient Hospital Stay: Payer: Medicare Other | Attending: Adult Health | Admitting: Adult Health

## 2019-05-30 ENCOUNTER — Other Ambulatory Visit: Payer: Self-pay

## 2019-05-30 DIAGNOSIS — Z9012 Acquired absence of left breast and nipple: Secondary | ICD-10-CM

## 2019-05-30 DIAGNOSIS — Z170 Estrogen receptor positive status [ER+]: Secondary | ICD-10-CM | POA: Diagnosis not present

## 2019-05-30 DIAGNOSIS — Z79811 Long term (current) use of aromatase inhibitors: Secondary | ICD-10-CM

## 2019-05-30 DIAGNOSIS — C50512 Malignant neoplasm of lower-outer quadrant of left female breast: Secondary | ICD-10-CM | POA: Diagnosis not present

## 2019-05-30 NOTE — Progress Notes (Signed)
CLINIC:  Survivorship   I connected with [name] on 05/30/19 at  2:00 PM EDT by telephone and verified that I am speaking with the correct person using two identifiers.   I discussed the limitations, risks, security and privacy concerns of performing an evaluation and management service by telephone and the availability of in person appointments.  I also discussed with the patient that there may be a patient responsible charge related to this service. The patient expressed understanding and agreed to proceed.    REASON FOR VISIT:  Routine follow-up post-treatment for a recent history of breast cancer.  BRIEF ONCOLOGIC HISTORY:  Oncology History  Malignant neoplasm of lower-outer quadrant of left breast of female, estrogen receptor positive ([place])  07/21/2017 Initial Diagnosis   Left breast lower outer quadrant 5.4 x 2.3 x 3.8 cm mass along with additional satellite nodules measuring 6 mm and several other small satellite nodules, no lymphadenopathy, biopsy revealed invasive lobular cancer with LCIS grade 2, ER 95%, PR 60%, Ki-67 10%, HER-2 negative ratio 1.11, T3 N0 stage II a clinical stage AJCC 8   08/21/2017 Oncotype testing   Oncotype Dx 10: ROR 7%   08/21/2017 -  Anti-estrogen oral therapy   Neoadjuvant letrozole   11/08/2018 Surgery   Left mastectomy: 5 cm invasive lobular cancer with 40% cellularity, grade 2, involves anterior margin which is skin, no LVI or PNI, 0/2 lymph nodes negative, ER 100%, PR 0%, Ki-67 5%, HER-2 -1+ by IHC, T2 N0   11/20/2018 Cancer Staging   Staging form: Breast, AJCC 8th Edition - Pathologic stage from 11/20/2018: No Stage Recommended (ypT2, pN0, cM0, G2, ER+, PR-, HER2-, Oncotype DX score: 10) - Signed by Nicholas Lose, MD on 11/20/2018     INTERVAL HISTORY:  Amanda [name] presents to the [facility] today for our initial meeting to review her survivorship care plan detailing her treatment course for breast cancer, as well as monitoring  long-term side effects of that treatment, education regarding health maintenance, screening, and overall wellness and health promotion.     Overall, Amanda [name] reports feeling quite well since completing her radiation therapy approximately 3 months ago.  She is taking  Letrozole daily and tolerates it moderately well. She has noted some weight gain and an increase in hunger.  She has some mild joint aches and pains in her knees and plans on exercising more.  She has mild vaginal dryness.  She is planning on setting up an appointment with Dr. Helane Rima.  She does have hot flashes, however these are mild and much improved.    REVIEW OF SYSTEMS:  Review of Systems  Constitutional: Negative for appetite change, chills, fatigue, fever and unexpected weight change.  HENT:   Negative for hearing loss and lump/mass.   Eyes: Negative for eye problems and icterus.  Respiratory: Negative for chest tightness, cough and shortness of breath.   Cardiovascular: Negative for chest pain, leg swelling and palpitations.  Gastrointestinal: Negative for abdominal distention, abdominal pain, constipation, diarrhea, nausea and vomiting.  Endocrine: Negative for hot flashes.  Musculoskeletal: Positive for arthralgias (as per interval history).  Skin: Negative for itching and rash.  Neurological: Negative for dizziness, extremity weakness and numbness.  Hematological: Negative for adenopathy. Does not bruise/bleed easily.  Psychiatric/Behavioral: Negative for depression. The patient is not nervous/anxious.   Breast: Denies any new nodularity, masses, tenderness, nipple changes, or nipple discharge.      ONCOLOGY TREATMENT TEAM:  1. Surgeon:  Dr. Brantley Stage at [facility]  2. Medical Oncologist: Dr. Lindi Adie  3. Radiation Oncologist: Dr. Isidore Moos    PAST MEDICAL/SURGICAL HISTORY:  Past Medical History:  Diagnosis Date  . Breast cancer, left breast ([place])   . Frequency of urination   . GAD (generalized  anxiety disorder)   . Osteopenia   . Parkinson disease [facility])    neurologist-  dr Arvella Nigh--  right side predominant  . Parkinson disease ([place])   . Prolapse of vaginal wall   . Urgency of urination   . Wears glasses    Past Surgical History:  Procedure Laterality Date  . ABDOMINAL HYSTERECTOMY    . BREAST BIOPSY Left 07/2017   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  . COLONOSCOPY  last one 2007  . CYSTOCELE REPAIR N/A 01/11/2016   Procedure: ANTERIOR REPAIR (CYSTOCELE) COLOPLAST FASCIA  SACROSPINOUS FIXATION;  Surgeon: Carolan Clines, MD;  Location: [place];  Service: Urology;  Laterality: N/A;  . CYSTOSCOPY W/ URETERAL STENT PLACEMENT Bilateral 01/11/2016   Procedure: CYSTOSCOPY WITH BILATERAL RETROGRADE PYELOGRAM/URETERAL STENT PLACEMENT;  Surgeon: Carolan Clines, MD;  Location: [place];  Service: Urology;  Laterality: Bilateral;  . ESOPHAGOGASTRODUODENOSCOPY  2007  . EXTERNAL EAR SURGERY Bilateral age 38  . LAPAROSCOPIC OVARIAN CYSTECTOMY  March 2008   unilateral  . PUBOVAGINAL SLING N/A 01/11/2016   Procedure:  ALTIS SLING SINGLE INCISION;  Surgeon: Carolan Clines, MD;  Location: [facility];  Service: Urology;  Laterality: N/A;  . RECTOCELE REPAIR N/A 01/11/2016   Procedure: POSTERIOR REPAIR (RECTOCELE), PERINEAPLASTY;  Surgeon: Dian Queen, MD;  Location: [place];  Service: Gynecology;  Laterality: N/A;  . SIMPLE MASTECTOMY WITH AXILLARY SENTINEL NODE BIOPSY Left 11/08/2018   Procedure: LEFT SIMPLE MASTECTOMY WITH LEFT  AXILLARY SENTINEL NODE MAPPING;  Surgeon: Erroll Luna, MD;  Location: [place];  Service: General;  Laterality: Left;  Marland Kitchen VAGINAL HYSTERECTOMY N/A 01/11/2016   Procedure: TOTAL VAGINAL HYSTERECTOMY ;  Surgeon: Dian Queen, MD;  Location: [facility];  Service: Gynecology;  Laterality: N/A;     ALLERGIES:  Allergies  Allergen Reactions  .  Penicillins     Leg pain Did it involve swelling of the face/tongue/throat, SOB, or low BP? No Did it involve sudden or severe rash/hives, skin peeling, or any reaction on the inside of your mouth or nose? No Did you need to seek medical attention at a hospital or doctor's office? No When did it last happen?childhood allergy If all above answers are "NO", may proceed with cephalosporin use.       CURRENT MEDICATIONS:  Outpatient Encounter Medications as of 05/30/2019  Medication Sig  . AMBULATORY NON FORMULARY MEDICATION Medication Name: Compression Stockings 20-84mHg  1 pair  . aspirin EC 325 MG tablet Take 325 mg by mouth daily as needed for moderate pain.  .Marland Kitchenb complex vitamins tablet Take 1 tablet by mouth daily. MEGA FOOD BALANCE SOURCE  . CALCIUM-MAGNESIUM-VITAMIN D PO Take 2 tablets by mouth 2 (two) times daily.  . carbidopa-levodopa (SINEMET IR) 25-100 MG tablet Take 1 tablet by mouth 2 (two) times daily.  . Cholecalciferol (VITAMIN D3) 1000 UNIT/SPRAY LIQD Take 2 sprays by mouth 2 (two) times daily.   .Marland Kitchenletrozole (FEMARA) 2.5 MG tablet TAKE 1 TABLET BY MOUTH EVERY DAY (Patient taking differently: Take 2.5 mg by mouth at bedtime. )  . pramipexole (MIRAPEX) 1 MG tablet Take 1 tablet by mouth 3 (three) times daily.  . Probiotic Product (PROBIOTIC DAILY) CAPS  Take 1 capsule by mouth daily as needed (indigestion).   . Propylene Glycol-Glycerin (SOOTHE OP) Place 2 drops into both eyes daily as needed (dry eyes).  . sertraline (ZOLOFT) 50 MG tablet Take 50 mg by mouth at bedtime.    No facility-administered encounter medications on file as of 05/30/2019.      ONCOLOGIC FAMILY HISTORY:  Family History  Problem Relation Age of Onset  . Heart disease Mother        enlarged heart  . Cancer Father        pancreatic  . COPD Sister   . Pulmonary embolism Sister   . Edema Sister        Ankles  . Heart disease Brother   . Polycystic kidney disease Son      GENETIC  COUNSELING/TESTING: Not at this time  SOCIAL HISTORY:  Social History   Socioeconomic History  . Marital status: Married    Spouse name: Chrissie Noa  . Number of children: 3  . Years of education: College  . Highest education level: Not on file  Occupational History  . Occupation: retired  Scientific laboratory technician  . Financial resource strain: Not on file  . Food insecurity    Worry: Not on file    Inability: Not on file  . Transportation needs    Medical: No    Non-medical: No  Tobacco Use  . Smoking status: Never Smoker  . Smokeless tobacco: Never Used  Substance and Sexual Activity  . Alcohol use: No  . Drug use: No  . Sexual activity: Not on file  Lifestyle  . Physical activity    Days per week: Not on file    Minutes per session: Not on file  . Stress: Not on file  Relationships  . Social Herbalist on phone: Not on file    Gets together: Not on file    Attends religious service: Not on file    Active member of club or organization: Not on file    Attends meetings of clubs or organizations: Not on file    Relationship status: Not on file  . Intimate partner violence    Fear of current or ex partner: Not on file    Emotionally abused: Not on file    Physically abused: Not on file    Forced sexual activity: Not on file  Other Topics Concern  . Not on file  Social History Narrative   Pt lives at home with family.   Caffeine Use: some, occasionally      OBJECTIVE: Patient is in no apparent distress.  Mood and behavior are normal.  Breathing non labored.  Speech is normal.    LABORATORY DATA:  None for this visit.  DIAGNOSTIC IMAGING:  None for this visit.      ASSESSMENT AND PLAN:  Ms.. [name] is a pleasant 72 y.o. 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  She presents to the Survivorship Clinic for our  initial meeting and routine follow-up post-completion of treatment for breast cancer.    1. Stage IIA left breast cancer:  Ms. Rennaker is continuing to recover from definitive treatment for breast cancer. She will follow-up with her medical oncologist, Dr. Lindi Adie in 07/2019 with history and physical exam per surveillance protocol.  She will continue her anti-estrogen therapy with Letrozole. Thus far, she is tolerating the Letrozole well, with minimal side  effects.  Today, a comprehensive survivorship care plan and treatment summary was reviewed with the patient today detailing her breast cancer diagnosis, treatment course, potential late/long-term effects of treatment, appropriate follow-up care with recommendations for the future, and patient education resources.  A copy of this summary, along with a letter will be sent to the patient's primary care provider via mail/fax/In Basket message after today's visit.    2. Vaginal Dryness: I recommended she take Replens three times a week.    3. Bone health:  Given Ms. Waskey's age/history of breast cancer and her current treatment regimen including anti-estrogen therapy with Anastrozole, she is at risk for bone demineralization.  She is unsure when her last bone density test was or where it was done.  She thinks she was told that she has osteopenia.  I will reach out to Dr. Helane Rima to see if their office has these results.  She was given education on specific activities to promote bone health.  4. Cancer screening:  Due to Ms. Lentz's history and her age, she should receive screening for skin cancers, colon cancer, and gynecologic cancers.  The information and recommendations are listed on the patient's comprehensive care plan/treatment summary and were reviewed in detail with the patient.    5. Health maintenance and wellness promotion: Ms. Prezioso was encouraged to consume 5-7 servings of fruits and vegetables per day. We reviewed the "Nutrition Rainbow"  handout, as well as the handout "Take Control of Your Health and Reduce Your Cancer Risk" from the [facility].  She was also encouraged to engage in moderate to vigorous exercise for 30 minutes per day most days of the week. We discussed the [employer] fitness program, which is designed for cancer survivors to help them become more physically fit after cancer treatments.  She was instructed to limit her alcohol consumption and continue to abstain from tobacco use.     6. Support services/counseling: It is not uncommon for this period of the patient's cancer care trajectory to be one of many emotions and stressors.  We discussed an opportunity for her to participate in the next session of [facility] ("Finding Your New Normal") support group series designed for patients after they have completed treatment.   Ms. Rambeau was encouraged to take advantage of our many other support services programs, support groups, and/or counseling in coping with her new life as a cancer survivor after completing anti-cancer treatment.  She was offered support today through active listening and expressive supportive counseling.  She was given information regarding our available services and encouraged to contact me with any questions or for help enrolling in any of our support group/programs.     Follow up instructions:    -Return to cancer center for f/u with Dr. Lindi Adie in 07/2019 -Mammogram due in 07/2019 -Follow up with Dr. Brantley Stage in 01/2019   The patient was provided an opportunity to ask questions and all were answered. The patient agreed with the plan and demonstrated an understanding of the instructions.   The patient was advised to call back or seek an in-person evaluation if the symptoms worsen or if the condition fails to improve as anticipated.   I provided 45 minutes of non face-to-face telephone visit time during this encounter, and > 50% was spent counseling as documented under my assessment &  plan.    Gardenia Phlegm, NP Survivorship Program [facility] [phone]   Note: PRIMARY CARE PROVIDER Ann Held, [place] [phone]

## 2019-06-18 DIAGNOSIS — G20 Parkinson's disease: Secondary | ICD-10-CM | POA: Diagnosis not present

## 2019-06-18 DIAGNOSIS — Z79899 Other long term (current) drug therapy: Secondary | ICD-10-CM | POA: Diagnosis not present

## 2019-07-29 ENCOUNTER — Other Ambulatory Visit: Payer: Self-pay | Admitting: Hematology and Oncology

## 2019-07-31 ENCOUNTER — Inpatient Hospital Stay: Payer: Medicare Other | Admitting: Hematology and Oncology

## 2019-08-19 DIAGNOSIS — M79671 Pain in right foot: Secondary | ICD-10-CM | POA: Diagnosis not present

## 2019-08-19 DIAGNOSIS — B351 Tinea unguium: Secondary | ICD-10-CM | POA: Diagnosis not present

## 2019-08-19 DIAGNOSIS — M79672 Pain in left foot: Secondary | ICD-10-CM | POA: Diagnosis not present

## 2019-08-19 DIAGNOSIS — M21619 Bunion of unspecified foot: Secondary | ICD-10-CM | POA: Insufficient documentation

## 2019-08-19 DIAGNOSIS — M2040 Other hammer toe(s) (acquired), unspecified foot: Secondary | ICD-10-CM | POA: Insufficient documentation

## 2019-08-19 DIAGNOSIS — M21611 Bunion of right foot: Secondary | ICD-10-CM | POA: Diagnosis not present

## 2019-08-19 DIAGNOSIS — M2041 Other hammer toe(s) (acquired), right foot: Secondary | ICD-10-CM | POA: Diagnosis not present

## 2019-08-26 DIAGNOSIS — Z853 Personal history of malignant neoplasm of breast: Secondary | ICD-10-CM | POA: Diagnosis not present

## 2019-09-16 NOTE — Progress Notes (Signed)
HEMATOLOGY-ONCOLOGY DOXIMITY VISIT PROGRESS NOTE  I connected with [name] on 09/17/2019 at 11:45 AM EST by Doximity video conference and verified that I am speaking with the correct person using two identifiers.  I discussed the limitations, risks, security and privacy concerns of performing an evaluation and management service by Doximity and the availability of in person appointments.  I also discussed with the patient that there may be a patient responsible charge related to this service. The patient expressed understanding and agreed to proceed.  Patient's Location: Home Physician Location: Clinic  CHIEF COMPLIANT: Follow-up of left breast cancer on letrozole  INTERVAL HISTORY: Amanda [name] is a 73 y.o. female with above-mentioned history of left breast cancer who took 1 year of neoadjuvant antiestrogen therapy with letrozole without interval response and subsequently underwent a left mastectomy, radiation, and is currently on letrozole. She presents over Doximity today for follow-up.  Her major complaint today is lack of sexual desire.  It could be from Zoloft or letrozole.  Oncology History  Malignant neoplasm of lower-outer quadrant of left breast of female, estrogen receptor positive ([place])  07/21/2017 Initial Diagnosis   Left breast lower outer quadrant 5.4 x 2.3 x 3.8 cm mass along with additional satellite nodules measuring 6 mm and several other small satellite nodules, no lymphadenopathy, biopsy revealed invasive lobular cancer with LCIS grade 2, ER 95%, PR 60%, Ki-67 10%, HER-2 negative ratio 1.11, T3 N0 stage II a clinical stage AJCC 8   08/21/2017 Oncotype testing   Oncotype Dx 10: ROR 7%   08/21/2017 -  Anti-estrogen oral therapy   Neoadjuvant letrozole   11/08/2018 Surgery   Left mastectomy: 5 cm invasive lobular cancer with 40% cellularity, grade 2, involves anterior margin which is skin, no LVI or PNI, 0/2 lymph nodes negative, ER 100%, PR 0%, Ki-67 5%, HER-2 -1+  by IHC, T2 N0   11/20/2018 Cancer Staging   Staging form: Breast, AJCC 8th Edition - Pathologic stage from 11/20/2018: No Stage Recommended (ypT2, pN0, cM0, G2, ER+, PR-, HER2-, Oncotype DX score: 10) - Signed by Nicholas Lose, MD on 11/20/2018   03/12/2019 - 04/24/2019 Radiation Therapy   Adjuvant radiation     REVIEW OF SYSTEMS:   Constitutional: Denies fevers, chills or abnormal weight loss Eyes: Denies blurriness of vision Ears, nose, mouth, throat, and face: Denies mucositis or sore throat Respiratory: Denies cough, dyspnea or wheezes Cardiovascular: Denies palpitation, chest discomfort Gastrointestinal:  Denies nausea, heartburn or change in bowel habits Skin: Denies abnormal skin rashes Lymphatics: Denies new lymphadenopathy or easy bruising Neurological:Denies numbness, tingling or new weaknesses Behavioral/Psych: Mood is stable, no new changes  Extremities: No lower extremity edema Breast: denies any pain or lumps or nodules All other systems were reviewed with the patient and are negative.  Observations/Objective:  There were no vitals filed for this visit. There is no height or weight on file to calculate BMI.  I have reviewed the data as listed CMP Latest Ref Rng & Units 10/31/2018 08/18/2017 05/02/2017  Glucose 70 - 99 mg/dL 93 98 88  BUN 8 - 23 mg/dL 18 24(H) 17  Creatinine 0.44 - 1.00 mg/dL 0.92 0.99 0.87  Sodium 135 - 145 mmol/L 140 139 140  Potassium 3.5 - 5.1 mmol/L 4.4 3.8 3.7  Chloride 98 - 111 mmol/L 104 101 103  CO2 22 - 32 mmol/L 25 32 32  Calcium 8.9 - 10.3 mg/dL 9.3 9.5 9.5  Total Protein 6.5 - 8.1 g/dL 6.4(L) - -  Total  Bilirubin 0.3 - 1.2 mg/dL 0.9 - -  Alkaline Phos 38 - 126 U/L 54 - -  AST 15 - 41 U/L 32 - -  ALT 0 - 44 U/L <5 - -    Lab Results  Component Value Date   WBC 4.0 10/31/2018   HGB 12.7 10/31/2018   HCT 37.0 10/31/2018   MCV 93.0 10/31/2018   PLT 631 (H) 10/31/2018   NEUTROABS 2.6 10/31/2018      Assessment Plan:  Malignant  neoplasm of lower-outer quadrant of left breast of female, estrogen receptor positive (Amanda [name]) 07/21/17: Left breast lower outer quadrant 5.4 x 2.3 x 3.8 cm mass along with additional satellite nodules measuring 6 mm and several other small satellite nodules, no lymphadenopathy, 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 Neoadjuvant therapy with hormonal therapy based upon low riskOncotype DX testresult (score 10: 7% ROR) on letrozole 2.5 mg daily 08/21/2017-11/08/2018 2.  Followed by left mastectomy: 11/08/2018 5 cm invasive lobular cancer with 40% cellularity, grade 2, involves anterior margin which is skin, no LVI or PNI, 0/2 lymph nodes negative, ER 100%, PR 0%, Ki-67 5%, HER-2 -1+ by IHC, T2 N0 3. Followed by adjuvant radiation 03/12/2019-04/24/2019 5. Followed by antiestrogen therapy with letrozole 1 mg daily started September 2020 ---------------------------------------------------------------- Letrozole toxicities:no side effects of concern Lack of sexual desire.  Breast cancer surveillance: Right breast mammograms have been ordered History of Parkinson's disease  Return to clinic in 1 year for follow-up  I discussed the assessment and treatment plan with the patient. The patient was provided an opportunity to ask questions and all were answered. The patient agreed with the plan and demonstrated an understanding of the instructions. The patient was advised to call back or seek an in-person evaluation if the symptoms worsen or if the condition fails to improve as anticipated.   I provided 15 minutes of face-to-face Doximity time during this encounter.    Rulon Eisenmenger, MD 09/17/2019   I, Molly Dorshimer, am acting as scribe for Nicholas Lose, MD.  I have reviewed the above document for accuracy and completeness, and I agree with the above.

## 2019-09-17 ENCOUNTER — Inpatient Hospital Stay: Payer: Medicare Other | Attending: Hematology and Oncology | Admitting: Hematology and Oncology

## 2019-09-17 DIAGNOSIS — C50512 Malignant neoplasm of lower-outer quadrant of left female breast: Secondary | ICD-10-CM

## 2019-09-17 DIAGNOSIS — Z170 Estrogen receptor positive status [ER+]: Secondary | ICD-10-CM

## 2019-09-17 NOTE — Assessment & Plan Note (Signed)
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 Neoadjuvant therapy with hormonal therapy based upon low riskOncotype DX testresult (score 10: 7% ROR) on letrozole 2.5 mg daily 08/21/2017-11/08/2018 2.  Followed by left mastectomy: 11/08/2018 5 cm invasive lobular cancer with 40% cellularity, grade 2, involves anterior margin which is skin, no LVI or PNI, 0/2 lymph nodes negative, ER 100%, PR 0%, Ki-67 5%, HER-2 -1+ by IHC, T2 N0 3. Followed by adjuvant radiation 03/12/2019-04/24/2019 5. Followed by antiestrogen therapy with anastrozole 1 mg daily started September 2020 ---------------------------------------------------------------- Anastrozole toxicities:  Breast cancer surveillance: Right breast mammograms have been ordered History of Parkinson's disease  Return to clinic in 1 year for follow-up

## 2019-09-19 ENCOUNTER — Telehealth: Payer: Self-pay | Admitting: Oncology

## 2019-09-19 NOTE — Telephone Encounter (Signed)
Scheduled per 12/29 los, called patient and left a voicemail.

## 2019-10-04 ENCOUNTER — Other Ambulatory Visit: Payer: Self-pay

## 2019-10-04 ENCOUNTER — Other Ambulatory Visit: Payer: Self-pay | Admitting: Hematology and Oncology

## 2019-10-04 ENCOUNTER — Ambulatory Visit
Admission: RE | Admit: 2019-10-04 | Discharge: 2019-10-04 | Disposition: A | Discharge status: Home / Self Care | Payer: Medicare Other | Source: Ambulatory Visit | Attending: Hematology and Oncology | Admitting: Hematology and Oncology

## 2019-10-04 DIAGNOSIS — C50512 Malignant neoplasm of lower-outer quadrant of left female breast: Secondary | ICD-10-CM

## 2019-10-04 DIAGNOSIS — Z1231 Encounter for screening mammogram for malignant neoplasm of breast: Secondary | ICD-10-CM | POA: Diagnosis not present

## 2019-10-18 DIAGNOSIS — R296 Repeated falls: Secondary | ICD-10-CM | POA: Diagnosis not present

## 2019-10-18 DIAGNOSIS — R4189 Other symptoms and signs involving cognitive functions and awareness: Secondary | ICD-10-CM | POA: Diagnosis not present

## 2019-10-18 DIAGNOSIS — Z79899 Other long term (current) drug therapy: Secondary | ICD-10-CM | POA: Diagnosis not present

## 2019-10-18 DIAGNOSIS — G20 Parkinson's disease: Secondary | ICD-10-CM | POA: Diagnosis not present

## 2019-10-24 DIAGNOSIS — Z23 Encounter for immunization: Secondary | ICD-10-CM | POA: Diagnosis not present

## 2019-11-22 DIAGNOSIS — Z23 Encounter for immunization: Secondary | ICD-10-CM | POA: Diagnosis not present

## 2020-04-17 DIAGNOSIS — R4189 Other symptoms and signs involving cognitive functions and awareness: Secondary | ICD-10-CM | POA: Diagnosis not present

## 2020-04-17 DIAGNOSIS — Z79899 Other long term (current) drug therapy: Secondary | ICD-10-CM | POA: Diagnosis not present

## 2020-04-17 DIAGNOSIS — Z880 Allergy status to penicillin: Secondary | ICD-10-CM | POA: Diagnosis not present

## 2020-04-17 DIAGNOSIS — G20 Parkinson's disease: Secondary | ICD-10-CM | POA: Diagnosis not present

## 2020-04-30 DIAGNOSIS — G20 Parkinson's disease: Secondary | ICD-10-CM | POA: Diagnosis not present

## 2020-05-15 DIAGNOSIS — G20 Parkinson's disease: Secondary | ICD-10-CM | POA: Diagnosis not present

## 2020-05-15 DIAGNOSIS — Z712 Person consulting for explanation of examination or test findings: Secondary | ICD-10-CM | POA: Diagnosis not present

## 2020-05-18 ENCOUNTER — Ambulatory Visit: Payer: Medicare Other | Admitting: Physical Therapy

## 2020-06-17 DIAGNOSIS — G20 Parkinson's disease: Secondary | ICD-10-CM | POA: Diagnosis not present

## 2020-06-17 DIAGNOSIS — Z880 Allergy status to penicillin: Secondary | ICD-10-CM | POA: Diagnosis not present

## 2020-06-17 DIAGNOSIS — R4189 Other symptoms and signs involving cognitive functions and awareness: Secondary | ICD-10-CM | POA: Diagnosis not present

## 2020-08-04 ENCOUNTER — Other Ambulatory Visit: Payer: Self-pay | Admitting: Hematology and Oncology

## 2020-08-25 DIAGNOSIS — Z23 Encounter for immunization: Secondary | ICD-10-CM | POA: Diagnosis not present

## 2020-08-25 DIAGNOSIS — Z853 Personal history of malignant neoplasm of breast: Secondary | ICD-10-CM | POA: Diagnosis not present

## 2020-08-31 ENCOUNTER — Telehealth: Payer: Self-pay | Admitting: Hematology and Oncology

## 2020-08-31 NOTE — Telephone Encounter (Signed)
Rescheduled 12/29 appts per provider PAL. Left voicemail with cancellation details and new appt date and time.

## 2020-09-16 ENCOUNTER — Inpatient Hospital Stay: Payer: Medicare Other | Admitting: Hematology and Oncology

## 2020-09-28 NOTE — Progress Notes (Signed)
HEMATOLOGY-ONCOLOGY TELEPHONE VISIT PROGRESS NOTE  I connected with Amanda [name] on 09/29/2020 at  2:00 PM EST by telephone and verified that I am speaking with the correct person using two identifiers.  I discussed the limitations, risks, security and privacy concerns of performing an evaluation and management service by telephone and the availability of in person appointments.  I also discussed with the patient that there may be a patient responsible charge related to this service. The patient expressed understanding and agreed to proceed.   History of Present Illness: Amanda [name] is a 75 y.o. female with above-mentioned history of left breast cancer who took 1 year of neoadjuvant antiestrogen therapy with letrozolewithout interval response and subsequently underwent a left mastectomy, radiation, and is currently on letrozole. Mammogram on 10/04/19 showed no evidence of malignancy in the right breast. She presents over the phone today for follow-up.    Oncology History  Malignant neoplasm of lower-outer quadrant of left breast of female, estrogen receptor positive ([place])  07/21/2017 Initial Diagnosis   Left breast lower outer quadrant 5.4 x 2.3 x 3.8 cm mass along with additional satellite nodules measuring 6 mm and several other small satellite nodules, no lymphadenopathy, biopsy revealed invasive lobular cancer with LCIS grade 2, ER 95%, PR 60%, Ki-67 10%, HER-2 negative ratio 1.11, T3 N0 stage II a clinical stage AJCC 8   08/21/2017 Oncotype testing   Oncotype Dx 10: ROR 7%   08/21/2017 -  Anti-estrogen oral therapy   Neoadjuvant letrozole   11/08/2018 Surgery   Left mastectomy: 5 cm invasive lobular cancer with 40% cellularity, grade 2, involves anterior margin which is skin, no LVI or PNI, 0/2 lymph nodes negative, ER 100%, PR 0%, Ki-67 5%, HER-2 -1+ by IHC, T2 N0   11/20/2018 Cancer Staging   Staging form: Breast, AJCC 8th Edition - Pathologic stage from 11/20/2018: No Stage  Recommended (ypT2, pN0, cM0, G2, ER+, PR-, HER2-, Oncotype DX score: 10) - Signed by Nicholas Lose, MD on 11/20/2018   03/12/2019 - 04/24/2019 Radiation Therapy   Adjuvant radiation     Observations/Objective:  Significant morbidity from her Parkinson's disease.   Assessment Plan:  Malignant neoplasm of lower-outer quadrant of left breast of female, estrogen receptor positive ([place]) 07/21/17: Left breast lower outer quadrant 5.4 x 2.3 x 3.8 cm mass along with additional satellite nodules measuring 6 mm and several other small satellite nodules, no lymphadenopathy, 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 Neoadjuvant therapy with hormonal therapy based upon low riskOncotype DX testresult (score 10: 7% ROR) on letrozole 2.5 mg daily12/11/2016-11/08/2018 2.Followed by left mastectomy: 11/08/2018 5 cm invasive lobular cancer with 40% cellularity, grade 2, involves anterior margin which is skin, no LVI or PNI, 0/2 lymph nodes negative, ER 100%, PR 0%, Ki-67 5%, HER-2 -1+ by IHC, T2 N0 3. Followed by adjuvant radiation 03/12/2019-04/24/2019 5. Followed by antiestrogen therapy with letrozole 1 mg daily started September 2020 ---------------------------------------------------------------- Letrozole toxicities:no side effects of concern Lack of sexual desire.  Breast cancer surveillance:  10/08/2019: Right breast mammogram: Benign breast density category C  History of Parkinson's disease: continues to be a challenge for her. Sees Dr.Siddiqui  Return to clinic in 1 year for follow-up    I discussed the assessment and treatment plan with the patient. The patient was provided an opportunity to ask questions and all were answered. The  patient agreed with the plan and demonstrated an understanding of the instructions. The patient was advised to call back or seek an  in-person evaluation if the symptoms worsen or if the condition fails to improve as anticipated.   I provided 20 minutes of non-face-to-face time during this encounter.   Amanda Eisenmenger, MD 09/29/2020    I, Molly Dorshimer, am acting as scribe for Nicholas Lose, MD.  I have reviewed the above documentation for accuracy and completeness, and I agree with the above.

## 2020-09-29 ENCOUNTER — Inpatient Hospital Stay: Payer: Medicare Other | Attending: Hematology and Oncology | Admitting: Hematology and Oncology

## 2020-09-29 DIAGNOSIS — C50512 Malignant neoplasm of lower-outer quadrant of left female breast: Secondary | ICD-10-CM | POA: Diagnosis not present

## 2020-09-29 DIAGNOSIS — Z170 Estrogen receptor positive status [ER+]: Secondary | ICD-10-CM | POA: Diagnosis not present

## 2020-09-29 MED ORDER — LETROZOLE 2.5 MG PO TABS: 2.5000 mg | ORAL_TABLET | Freq: Every day | ORAL | 3 refills | Status: DC

## 2020-09-29 NOTE — Assessment & Plan Note (Signed)
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 Neoadjuvant therapy with hormonal therapy based upon low riskOncotype DX testresult (score 10: 7% ROR) on letrozole 2.5 mg daily12/11/2016-11/08/2018 2.Followed by left mastectomy: 11/08/2018 5 cm invasive lobular cancer with 40% cellularity, grade 2, involves anterior margin which is skin, no LVI or PNI, 0/2 lymph nodes negative, ER 100%, PR 0%, Ki-67 5%, HER-2 -1+ by IHC, T2 N0 3. Followed by adjuvant radiation 03/12/2019-04/24/2019 5. Followed by antiestrogen therapy with letrozole 1 mg daily started September 2020 ---------------------------------------------------------------- Letrozole toxicities:no side effects of concern Lack of sexual desire.  Breast cancer surveillance:  1. 10/08/2019: Right breast mammogram: Benign breast density category C 2. Breast exam 09/29/2020: Benign  History of Parkinson's disease  Return to clinic in 1 year for follow-up

## 2020-12-14 ENCOUNTER — Encounter: Payer: Self-pay | Admitting: Family Medicine

## 2020-12-14 ENCOUNTER — Other Ambulatory Visit: Payer: Self-pay

## 2020-12-14 ENCOUNTER — Ambulatory Visit (INDEPENDENT_AMBULATORY_CARE_PROVIDER_SITE_OTHER): Payer: Medicare Other | Admitting: Family Medicine

## 2020-12-14 VITALS — BP 100/60 | HR 56 | Temp 98.1°F | Resp 18 | Ht 64.0 in | Wt 132.6 lb

## 2020-12-14 DIAGNOSIS — G20A1 Parkinson's disease without dyskinesia, without mention of fluctuations: Secondary | ICD-10-CM

## 2020-12-14 DIAGNOSIS — G620 Drug-induced polyneuropathy: Secondary | ICD-10-CM | POA: Diagnosis not present

## 2020-12-14 DIAGNOSIS — Z136 Encounter for screening for cardiovascular disorders: Secondary | ICD-10-CM | POA: Diagnosis not present

## 2020-12-14 DIAGNOSIS — G20 Parkinson's disease: Secondary | ICD-10-CM | POA: Diagnosis not present

## 2020-12-14 DIAGNOSIS — R197 Diarrhea, unspecified: Secondary | ICD-10-CM | POA: Insufficient documentation

## 2020-12-14 DIAGNOSIS — R32 Unspecified urinary incontinence: Secondary | ICD-10-CM

## 2020-12-14 DIAGNOSIS — R1013 Epigastric pain: Secondary | ICD-10-CM

## 2020-12-14 DIAGNOSIS — Z1159 Encounter for screening for other viral diseases: Secondary | ICD-10-CM | POA: Diagnosis not present

## 2020-12-14 LAB — CBC WITH DIFFERENTIAL/PLATELET
Basophils Absolute: 0 10*3/uL (ref 0.0–0.1)
Basophils Relative: 0.5 % (ref 0.0–3.0)
Eosinophils Absolute: 0.1 10*3/uL (ref 0.0–0.7)
Eosinophils Relative: 0.9 % (ref 0.0–5.0)
HCT: 38.8 % (ref 36.0–46.0)
Hemoglobin: 12.7 g/dL (ref 12.0–15.0)
Lymphocytes Relative: 14 % (ref 12.0–46.0)
Lymphs Abs: 0.9 10*3/uL (ref 0.7–4.0)
MCHC: 32.8 g/dL (ref 30.0–36.0)
MCV: 94.2 fl (ref 78.0–100.0)
Monocytes Absolute: 0.5 10*3/uL (ref 0.1–1.0)
Monocytes Relative: 7.4 % (ref 3.0–12.0)
Neutro Abs: 4.8 10*3/uL (ref 1.4–7.7)
Neutrophils Relative %: 77.2 % — ABNORMAL HIGH (ref 43.0–77.0)
Platelets: 182 10*3/uL (ref 150.0–400.0)
RBC: 4.12 Mil/uL (ref 3.87–5.11)
RDW: 14 % (ref 11.5–15.5)
WBC: 6.3 10*3/uL (ref 4.0–10.5)

## 2020-12-14 LAB — COMPREHENSIVE METABOLIC PANEL
ALT: 4 U/L (ref 0–35)
AST: 18 U/L (ref 0–37)
Albumin: 4.1 g/dL (ref 3.5–5.2)
Alkaline Phosphatase: 61 U/L (ref 39–117)
BUN: 27 mg/dL — ABNORMAL HIGH (ref 6–23)
CO2: 26 mEq/L (ref 19–32)
Calcium: 9.4 mg/dL (ref 8.4–10.5)
Chloride: 105 mEq/L (ref 96–112)
Creatinine, Ser: 0.8 mg/dL (ref 0.40–1.20)
GFR: 73.02 mL/min (ref >60.00)
Glucose, Bld: 97 mg/dL (ref 70–99)
Potassium: 4 mEq/L (ref 3.5–5.1)
Sodium: 138 mEq/L (ref 135–145)
Total Bilirubin: 0.5 mg/dL (ref 0.2–1.2)
Total Protein: 6.3 g/dL (ref 6.0–8.3)

## 2020-12-14 LAB — LIPID PANEL
Cholesterol: 177 mg/dL (ref 0–200)
HDL: 64.3 mg/dL (ref >39.00)
LDL Cholesterol: 99 mg/dL (ref 0–99)
NonHDL: 112.43
Total CHOL/HDL Ratio: 3
Triglycerides: 65 mg/dL (ref 0.0–149.0)
VLDL: 13 mg/dL (ref 0.0–40.0)

## 2020-12-14 MED ORDER — FAMOTIDINE 20 MG PO TABS: 20.0000 mg | ORAL_TABLET | Freq: Two times a day (BID) | ORAL | 2 refills | Status: DC

## 2020-12-14 NOTE — Progress Notes (Signed)
Patient ID: Amanda [name], female    DOB: [birth year]  Age: 75 y.o. MRN: [id number]    Subjective:  Subjective  HPI Amanda [name] presents for an office visit today. She complains of diarrhea. She reports that the diarrhea episode occurs every other days and it has been occurring since 3 weeks ago. She describes the bowel movement as loose and has resulted in her experiencing incontinent. She states that she has been taking probiotic and fibers but it has improved her symptoms slightly.She denies any abdominal pain and blood in stool. She also complains of nausea and acid reflux in the morning. She attributes the nausea to 50 mg of Zoloft PO daily.She states that eating food and taking Tums  relieved the symptoms. She also complains of right LE pain. She reports taking 25-100 mg Carbidopa-levodopa PO daily to relieve her pain, however she is still experience pain and it has resulted in her being unable to walk properly. She also complains of bilateral callous on her feet (R>L). She states that it has gotten worse. She denies any chest pain, SOB, fever, cough, chills, sore throat, dysuria, urinary incontinence, back pain, HA, or vomiting at this time.    Review of Systems  Constitutional: Negative for chills, fatigue and fever.  HENT: Negative for ear pain, sinus pain and sore throat.   Eyes: Negative for pain.  Respiratory: Negative for cough and shortness of breath.   Cardiovascular: Negative for chest pain, palpitations and leg swelling.  Gastrointestinal: Positive for diarrhea and nausea. Negative for abdominal pain, blood in stool, constipation and vomiting.       (+) acid reflux    Genitourinary: Negative for dysuria, frequency, hematuria and urgency.  Musculoskeletal: Negative for back pain.  Skin:       (+) Bilateral Callous on her feet   Neurological: Negative for headaches.    History Past Medical History:  Diagnosis Date  . Breast cancer, left breast ([place])   . Frequency of  urination   . GAD (generalized anxiety disorder)   . Osteopenia   . Parkinson disease [facility])    neurologist-  dr Arvella Nigh--  right side predominant  . Parkinson disease ([place])   . Prolapse of vaginal wall   . Urgency of urination   . Wears glasses     She has a past surgical history that includes Laparoscopic ovarian cystectomy (March 2008); External ear surgery (Bilateral, age 41); Colonoscopy (last one 2007); Esophagogastroduodenoscopy (2007); Cystoscopy w/ ureteral stent placement (Bilateral, 01/11/2016); Cystocele repair (N/A, 01/11/2016); Pubovaginal sling (N/A, 01/11/2016); Vaginal hysterectomy (N/A, 01/11/2016); Rectocele repair (N/A, 01/11/2016); Breast biopsy (Left, 07/2017); Abdominal hysterectomy; Simple mastectomy with axillary sentinel node biopsy (Left, 11/08/2018); and Mastectomy (Left, 2018).   Her family history includes COPD in her sister; Cancer in her father; Edema in her sister; Heart disease in her brother and mother; Polycystic kidney disease in her son; Pulmonary embolism in her sister.She reports that she has never smoked. She has never used smokeless tobacco. She reports that she does not drink alcohol and does not use drugs.  Current Outpatient Medications on File Prior to Visit  Medication Sig Dispense Refill  . AMBULATORY NON FORMULARY MEDICATION Medication Name: Compression Stockings 20-57mmHg  1 pair 1 each 0  . aspirin EC 325 MG tablet Take 325 mg by mouth daily as needed for moderate pain.    Marland Kitchen b complex vitamins tablet Take 1 tablet by mouth daily. MEGA FOOD BALANCE SOURCE    . CALCIUM-MAGNESIUM-VITAMIN D PO  Take 2 tablets by mouth 2 (two) times daily.    . carbidopa-levodopa (SINEMET IR) 25-100 MG tablet Take 1 tablet by mouth 2 (two) times daily.    . Cholecalciferol (VITAMIN D3) 1000 UNIT/SPRAY LIQD Take 2 sprays by mouth 2 (two) times daily.     Marland Kitchen letrozole (FEMARA) 2.5 MG tablet Take 1 tablet (2.5 mg total) by mouth daily. 90 tablet 3  . pramipexole  (MIRAPEX) 1 MG tablet Take 1 tablet by mouth 3 (three) times daily.  3  . Probiotic Product (PROBIOTIC DAILY) CAPS Take 1 capsule by mouth daily as needed (indigestion).     . Propylene Glycol-Glycerin (SOOTHE OP) Place 2 drops into both eyes daily as needed (dry eyes).    . sertraline (ZOLOFT) 50 MG tablet Take 50 mg by mouth at bedtime.     . donepezil (ARICEPT) 5 MG tablet Take 5 mg by mouth daily.     No current facility-administered medications on file prior to visit.     Objective:  Objective  Physical Exam Vitals and nursing note reviewed.  Constitutional:      General: She is not in acute distress.    Appearance: Normal appearance. She is well-developed. She is not ill-appearing.  HENT:     Head: Normocephalic and atraumatic.     Right Ear: External ear normal.     Left Ear: External ear normal.     Nose: Nose normal.  Eyes:     Pupils: Pupils are equal, round, and reactive to light.  Cardiovascular:     Rate and Rhythm: Normal rate and regular rhythm.     Pulses: Normal pulses.     Heart sounds: Normal heart sounds. No murmur heard. No friction rub. No gallop.   Pulmonary:     Effort: Pulmonary effort is normal. No respiratory distress.     Breath sounds: Normal breath sounds. No wheezing, rhonchi or rales.  Abdominal:     General: Bowel sounds are normal. There is no distension.     Palpations: Abdomen is soft.     Tenderness: There is no abdominal tenderness. There is no guarding or rebound.     Hernia: No hernia is present.  Musculoskeletal:     Cervical back: Normal range of motion and neck supple.  Skin:    General: Skin is warm and dry.  Neurological:     Mental Status: She is alert and oriented to person, place, and time.  Psychiatric:        Behavior: Behavior normal.        Thought Content: Thought content normal.    BP 100/60 (BP Location: Left Arm, Patient Position: Sitting, Cuff Size: Normal)   Pulse (!) 56   Temp 98.1 F (36.7 C) (Oral)   Resp  18   Ht 5\' 4"  (1.626 m)   Wt 132 lb 9.6 oz (60.1 kg)   SpO2 96%   BMI 22.76 kg/m  Wt Readings from Last 3 Encounters:  12/14/20 132 lb 9.6 oz (60.1 kg)  11/27/18 130 lb 9.6 oz (59.2 kg)  11/20/18 129 lb 14.4 oz (58.9 kg)     Lab Results  Component Value Date   WBC 6.3 12/14/2020   HGB 12.7 12/14/2020   HCT 38.8 12/14/2020   PLT 182.0 12/14/2020   GLUCOSE 97 12/14/2020   CHOL 177 12/14/2020   TRIG 65.0 12/14/2020   HDL 64.30 12/14/2020   LDLCALC 99 12/14/2020   ALT 4 12/14/2020   AST 18 12/14/2020  NA 138 12/14/2020   K 4.0 12/14/2020   CL 105 12/14/2020   CREATININE 0.80 12/14/2020   BUN 27 (H) 12/14/2020   CO2 26 12/14/2020   TSH 1.00 08/17/2011    MM 3D SCREEN BREAST UNI RIGHT  Result Date: 10/08/2019 CLINICAL DATA:  Screening. EXAM: DIGITAL SCREENING UNILATERAL RIGHT MAMMOGRAM WITH CAD AND TOMO COMPARISON:  Previous exam(s). ACR Breast Density Category c: The breast tissue is heterogeneously dense, which may obscure small masses. FINDINGS: The patient has had a left mastectomy. There are no findings suspicious for malignancy. Images were processed with CAD. IMPRESSION: No mammographic evidence of malignancy. A result letter of this screening mammogram will be mailed directly to the patient. RECOMMENDATION: Screening mammogram in one year.  (Code:SM-R-58M) BI-RADS CATEGORY  1: Negative. Electronically Signed   By: Nolon Nations M.D.   On: 10/08/2019 09:49     Assessment & Plan:  Plan    Meds ordered this encounter  Medications  . famotidine (PEPCID) 20 MG tablet    Sig: Take 1 tablet (20 mg total) by mouth 2 (two) times daily.    Dispense:  60 tablet    Refill:  2    Problem List Items Addressed This Visit      Unprioritized   Diarrhea - Primary    Actually occassional loose stool -- ? From meds?   Inc fiber in diet , probiotic Pt is overdue for colon -- refer to GI       Relevant Orders   CBC with Differential/Platelet (Completed)   Comprehensive  metabolic panel (Completed)   Thyroid Panel With TSH   Ambulatory referral to Gastroenterology   Drug-induced polyneuropathy ([place])   Relevant Medications   donepezil (ARICEPT) 5 MG tablet   Dyspepsia    She has been using tums rx pepcid bid  Avoid acidic spicy foods, caffeine, peppermint etc       Relevant Medications   famotidine (PEPCID) 20 MG tablet   Incontinence in female    Check urine today May be from parkinsons       Relevant Orders   POCT Urinalysis Dipstick (Automated)   Parkinson's disease ([place])    Per neuro       Other Visit Diagnoses    Ischemic heart disease screen       Relevant Orders   Lipid panel (Completed)   Need for hepatitis C screening test       Relevant Orders   Hepatitis C antibody      Follow-up: Return in about 3 months (around 03/16/2021), or if symptoms worsen or fail to improve.   I,Gordon Zheng,acting as a Education administrator for [employer], DO.,have documented all relevant documentation on the behalf of Ann Held, DO,as directed by  Ann Held, DO while in the presence of Bunk Foss, DO, have reviewed all documentation for this visit. The documentation on 12/14/20 for the exam, diagnosis, procedures, and orders are all accurate and complete.

## 2020-12-14 NOTE — Assessment & Plan Note (Signed)
She has been using tums rx pepcid bid  Avoid acidic spicy foods, caffeine, peppermint etc

## 2020-12-14 NOTE — Patient Instructions (Signed)
Diarrhea, Adult Diarrhea is frequent loose and watery bowel movements. Diarrhea can make you feel weak and cause you to become dehydrated. Dehydration can make you tired and thirsty, cause you to have a dry mouth, and decrease how often you urinate. Diarrhea typically lasts 2-3 days. However, it can last longer if it is a sign of something more serious. It is important to treat your diarrhea as told by your health care provider. Follow these instructions at home: Eating and drinking Follow these recommendations as told by your health care provider:  Take an oral rehydration solution (ORS). This is an over-the-counter medicine that helps return your body to its normal balance of nutrients and water. It is found at pharmacies and retail stores.  Drink plenty of fluids, such as water, ice chips, diluted fruit juice, and low-calorie sports drinks. You can drink milk also, if desired.  Avoid drinking fluids that contain a lot of sugar or caffeine, such as energy drinks, sports drinks, and soda.  Eat bland, easy-to-digest foods in small amounts as you are able. These foods include bananas, applesauce, rice, lean meats, toast, and crackers.  Avoid alcohol.  Avoid spicy or fatty foods.      Medicines  Take over-the-counter and prescription medicines only as told by your health care provider.  If you were prescribed an antibiotic medicine, take it as told by your health care provider. Do not stop using the antibiotic even if you start to feel better. General instructions  Wash your hands often using soap and water. If soap and water are not available, use a hand sanitizer. Others in the household should wash their hands as well. Hands should be washed: ? After using the toilet or changing a diaper. ? Before preparing, cooking, or serving food. ? While caring for a sick person or while visiting someone in a hospital.  Drink enough fluid to keep your urine pale yellow.  Rest at home while you  recover.  Watch your condition for any changes.  Take a warm bath to relieve any burning or pain from frequent diarrhea episodes.  Keep all follow-up visits as told by your health care provider. This is important.   Contact a health care provider if:  You have a fever.  Your diarrhea gets worse.  You have new symptoms.  You cannot keep fluids down.  You feel light-headed or dizzy.  You have a headache.  You have muscle cramps. Get help right away if:  You have chest pain.  You feel extremely weak or you faint.  You have bloody or black stools or stools that look like tar.  You have severe pain, cramping, or bloating in your abdomen.  You have trouble breathing or you are breathing very quickly.  Your heart is beating very quickly.  Your skin feels cold and clammy.  You feel confused.  You have signs of dehydration, such as: ? Dark urine, very little urine, or no urine. ? Cracked lips. ? Dry mouth. ? Sunken eyes. ? Sleepiness. ? Weakness. Summary  Diarrhea is frequent loose and watery bowel movements. Diarrhea can make you feel weak and cause you to become dehydrated.  Drink enough fluids to keep your urine pale yellow.  Make sure that you wash your hands after using the toilet. If soap and water are not available, use hand sanitizer.  Contact a health care provider if your diarrhea gets worse or you have new symptoms.  Get help right away if you have signs of dehydration.  This information is not intended to replace advice given to you by your health care provider. Make sure you discuss any questions you have with your health care provider. Document Revised: 01/22/2019 Document Reviewed: 02/09/2018 Elsevier Patient Education  2021 [employer].

## 2020-12-14 NOTE — Assessment & Plan Note (Signed)
Per neuro 

## 2020-12-14 NOTE — Assessment & Plan Note (Signed)
Check urine today May be from parkinsons

## 2020-12-14 NOTE — Assessment & Plan Note (Signed)
Actually occassional loose stool -- ? From meds?   Inc fiber in diet , probiotic Pt is overdue for colon -- refer to GI

## 2020-12-15 LAB — HEPATITIS C ANTIBODY
Hepatitis C Ab: NONREACTIVE
SIGNAL TO CUT-OFF: 0.01 (ref <1.00)

## 2020-12-15 LAB — THYROID PANEL WITH TSH
Free Thyroxine Index: 1.7 (ref 1.4–3.8)
T3 Uptake: 30 % (ref 22–35)
T4, Total: 5.7 ug/dL (ref 5.1–11.9)
TSH: 2.39 mIU/L (ref 0.40–4.50)

## 2020-12-24 ENCOUNTER — Telehealth: Payer: Self-pay | Admitting: *Deleted

## 2020-12-24 NOTE — Telephone Encounter (Signed)
Order has been placed.

## 2020-12-24 NOTE — Addendum Note (Signed)
Addended by: Sanda Linger on: 12/24/2020 10:46 AM   Modules accepted: Orders

## 2020-12-24 NOTE — Telephone Encounter (Signed)
Pt has returned IFOB today.  I do not see any future order in Epic. Please place future order or advise what dx code to use for the order?

## 2020-12-24 NOTE — Telephone Encounter (Signed)
Thank you :)

## 2020-12-25 ENCOUNTER — Other Ambulatory Visit (INDEPENDENT_AMBULATORY_CARE_PROVIDER_SITE_OTHER): Payer: Medicare Other

## 2020-12-25 DIAGNOSIS — R197 Diarrhea, unspecified: Secondary | ICD-10-CM

## 2020-12-25 LAB — FECAL OCCULT BLOOD, IMMUNOCHEMICAL: Fecal Occult Bld: NEGATIVE

## 2020-12-30 DIAGNOSIS — G20 Parkinson's disease: Secondary | ICD-10-CM | POA: Diagnosis not present

## 2020-12-30 DIAGNOSIS — G3184 Mild cognitive impairment, so stated: Secondary | ICD-10-CM | POA: Diagnosis not present

## 2020-12-30 DIAGNOSIS — Z79899 Other long term (current) drug therapy: Secondary | ICD-10-CM | POA: Diagnosis not present

## 2021-01-26 ENCOUNTER — Ambulatory Visit: Payer: Medicare Other | Admitting: Internal Medicine

## 2021-01-26 ENCOUNTER — Telehealth: Payer: Self-pay

## 2021-01-26 NOTE — Telephone Encounter (Signed)
Received call from Solaris Kram- Pt's daughter- wanted to check for lab results from Pt's visit in March 2022- informed they are normal. We deactivated Pt's mychart.

## 2021-01-29 ENCOUNTER — Ambulatory Visit
Admission: RE | Admit: 2021-01-29 | Discharge: 2021-01-29 | Disposition: A | Discharge status: Home / Self Care | Payer: Medicare Other | Source: Ambulatory Visit | Attending: Hematology and Oncology | Admitting: Hematology and Oncology

## 2021-01-29 ENCOUNTER — Other Ambulatory Visit: Payer: Self-pay

## 2021-01-29 DIAGNOSIS — Z1231 Encounter for screening mammogram for malignant neoplasm of breast: Secondary | ICD-10-CM | POA: Diagnosis not present

## 2021-01-29 DIAGNOSIS — C50512 Malignant neoplasm of lower-outer quadrant of left female breast: Secondary | ICD-10-CM

## 2021-02-26 DIAGNOSIS — Z79899 Other long term (current) drug therapy: Secondary | ICD-10-CM | POA: Diagnosis not present

## 2021-02-26 DIAGNOSIS — Z880 Allergy status to penicillin: Secondary | ICD-10-CM | POA: Diagnosis not present

## 2021-02-26 DIAGNOSIS — G20 Parkinson's disease: Secondary | ICD-10-CM | POA: Diagnosis not present

## 2021-03-22 ENCOUNTER — Other Ambulatory Visit: Payer: Self-pay | Admitting: Family Medicine

## 2021-03-22 DIAGNOSIS — R1013 Epigastric pain: Secondary | ICD-10-CM

## 2021-04-14 DIAGNOSIS — Z01419 Encounter for gynecological examination (general) (routine) without abnormal findings: Secondary | ICD-10-CM | POA: Diagnosis not present

## 2021-04-14 DIAGNOSIS — R2689 Other abnormalities of gait and mobility: Secondary | ICD-10-CM | POA: Diagnosis not present

## 2021-04-14 DIAGNOSIS — N958 Other specified menopausal and perimenopausal disorders: Secondary | ICD-10-CM | POA: Diagnosis not present

## 2021-04-14 DIAGNOSIS — M816 Localized osteoporosis [Lequesne]: Secondary | ICD-10-CM | POA: Diagnosis not present

## 2021-04-14 DIAGNOSIS — Z6823 Body mass index (BMI) 23.0-23.9, adult: Secondary | ICD-10-CM | POA: Diagnosis not present

## 2021-04-14 DIAGNOSIS — K529 Noninfective gastroenteritis and colitis, unspecified: Secondary | ICD-10-CM | POA: Diagnosis not present

## 2021-05-04 ENCOUNTER — Other Ambulatory Visit: Payer: Self-pay

## 2021-05-04 ENCOUNTER — Telehealth (INDEPENDENT_AMBULATORY_CARE_PROVIDER_SITE_OTHER): Payer: Medicare Other | Admitting: Family

## 2021-05-04 DIAGNOSIS — H109 Unspecified conjunctivitis: Secondary | ICD-10-CM

## 2021-05-04 MED ORDER — TOBRAMYCIN 0.3 % OP SOLN: 1.0000 [drp] | Freq: Four times a day (QID) | OPHTHALMIC | 0 refills | Status: DC

## 2021-05-04 NOTE — Progress Notes (Signed)
Amanda [name] is a 75 y.o. 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     b complex vitamins tablet Take 1 tablet by mouth daily. MEGA FOOD BALANCE SOURCE     CALCIUM-MAGNESIUM-VITAMIN D PO Take 2 tablets by mouth 2 (two) times daily.     carbidopa-levodopa (SINEMET IR) 25-100 MG tablet Take 1 tablet by mouth 2 (two) times daily.     donepezil (ARICEPT) 5 MG tablet Take 5 mg by mouth daily.     famotidine (PEPCID) 20 MG tablet TAKE 1 TABLET BY MOUTH TWICE A DAY 180 tablet 1   letrozole (FEMARA) 2.5 MG tablet Take 1 tablet (2.5 mg total) by mouth daily. 90 tablet 3   pramipexole (MIRAPEX) 1 MG tablet Take 1 tablet by mouth 3 (three) times daily.  3   Probiotic Product (PROBIOTIC DAILY) CAPS Take 1 capsule by mouth daily as needed (indigestion).      Propylene Glycol-Glycerin (SOOTHE OP) Place 2 drops into both eyes daily as needed (dry eyes).     sertraline (ZOLOFT) 50 MG tablet Take 50 mg by mouth at bedtime.      tobramycin (TOBREX) 0.3 % ophthalmic solution Place 1 drop into both eyes every 6 (six) hours. 5 mL 0   AMBULATORY NON FORMULARY MEDICATION  Medication Name: Compression Stockings 20-62mHg  1 pair (Patient not taking: Reported on 05/04/2021) 1 each 0   Cholecalciferol (VITAMIN D3) 1000 UNIT/SPRAY LIQD Take 2 sprays by mouth 2 (two) times daily.  (Patient not taking: Reported on 05/04/2021)     No current facility-administered medications for this visit.    Allergies: Penicillins  Past Medical History:  Diagnosis Date   [place], [place] breast [place]    Frequency of urination    GAD (generalized anxiety disorder)    Osteopenia    Parkinson disease ([place]    neurologist-  dr sArvella Nigh-  right side predominant   [place]inson disease ([place]    Prolapse of vaginal wall    Urgency of urination    Wears glasses     Past Surgical History:  Procedure Laterality Date   ABDOMINAL HYSTERECTOMY     BREAST BIOPSY Left 07/2017   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   COLONOSCOPY  last one 2007   CYSTOCELE REPAIR N/A 01/11/2016   Procedure: ANTERIOR REPAIR (CYSTOCELE) COLOPLAST FASCIA  SACROSPINOUS FIXATION;  Surgeon: SCarolan Clines MD;  [place]  Service: Urology;  Laterality: N/A;   CYSTOSCOPY W/ URETERAL STENT PLACEMENT Bilateral 01/11/2016   Procedure: CYSTOSCOPY WITH BILATERAL RETROGRADE PYELOGRAM/URETERAL STENT PLACEMENT;  Surgeon: SCarolan Clines MD;  [place] [facility]  Service: Urology;  Laterality: Bilateral;   ESOPHAGOGASTRODUODENOSCOPY  2007   EXTERNAL EAR SURGERY Bilateral age 31   LAPAROSCOPIC OVARIAN CYSTECTOMY  March 2008   unilateral   MASTECTOMY Left 2018   PUBOVAGINAL SLING N/A 01/11/2016   Procedure:  ALTIS SLING SINGLE INCISION;  Surgeon: Carolan Clines, MD;  [facility];  Service: Urology;  Laterality: N/A;   RECTOCELE REPAIR N/A 01/11/2016   Procedure: POSTERIOR REPAIR (RECTOCELE), PERINEAPLASTY;  Surgeon: Dian Queen, MD;  Location: [place];  Service: Gynecology;  Laterality: N/A;   SIMPLE  MASTECTOMY WITH AXILLARY SENTINEL NODE BIOPSY Left 11/08/2018   Procedure: LEFT SIMPLE MASTECTOMY WITH LEFT  AXILLARY SENTINEL NODE MAPPING;  Surgeon: Erroll Luna, MD;  Location: [place];  Service: General;  Laterality: Left;   VAGINAL HYSTERECTOMY N/A 01/11/2016   Procedure: TOTAL VAGINAL HYSTERECTOMY ;  Surgeon: Dian Queen, MD;  Location: [place];  Service: Gynecology;  Laterality: N/A;    Family History  Problem Relation Age of Onset   Heart disease Mother        enlarged heart   Cancer Father        pancreatic   COPD Sister    Pulmonary embolism Sister    Edema Sister        Ankles   Heart disease Brother    Polycystic kidney disease Son     Social History   Tobacco Use   Smoking status: Never   Smokeless tobacco: Never  Substance Use Topics   Alcohol use: No    Subjective:    I connected with [name] on 05/04/21 at  2:40 PM EDT by a telephone call and verified that I am speaking with the correct person using two identifiers.   I discussed the limitations of evaluation and management by telemedicine and the availability of in person appointments. The patient expressed understanding and agreed to proceed. Provider in office/ patient is at home; provider and patient are only 2 people on telephone call.   Patient unable to get video call to work- telephone call done instead; she is worried about possible pink eye; notes that both eyes are red and she had "matter crusted on lids" when she wakes up; no vision loss or eye pain; does have some sinus congestion which started today but no concerns for sinus infection; has not taken home COVID test;  Objective:  There were no vitals filed for this visit.  Lungs: Respirations unlabored;  Neurologic: Alert and oriented; speech intact;   Assessment:  1. Conjunctivitis of both eyes, unspecified conjunctivitis type     Plan:  Will treat based on patient's description of symptoms; Rx for Tobrex- use as  directed; follow up worse, no better  Time spent 10 minutes  No follow-ups on file.  No orders of the defined types were placed in this encounter.   Requested Prescriptions   Signed Prescriptions Disp Refills   tobramycin (TOBREX) 0.3 % ophthalmic solution 5 mL 0    Sig: Place 1 drop into both eyes every 6 (six) hours.

## 2021-05-25 DIAGNOSIS — R443 Hallucinations, unspecified: Secondary | ICD-10-CM | POA: Diagnosis not present

## 2021-05-25 DIAGNOSIS — G3184 Mild cognitive impairment, so stated: Secondary | ICD-10-CM | POA: Diagnosis not present

## 2021-05-25 DIAGNOSIS — Z880 Allergy status to penicillin: Secondary | ICD-10-CM | POA: Diagnosis not present

## 2021-05-25 DIAGNOSIS — Z79899 Other long term (current) drug therapy: Secondary | ICD-10-CM | POA: Diagnosis not present

## 2021-05-25 DIAGNOSIS — G20 Parkinson's disease: Secondary | ICD-10-CM | POA: Diagnosis not present

## 2021-06-05 ENCOUNTER — Ambulatory Visit (INDEPENDENT_AMBULATORY_CARE_PROVIDER_SITE_OTHER): Payer: Medicare Other

## 2021-06-05 VITALS — Ht 64.0 in | Wt 125.0 lb

## 2021-06-05 DIAGNOSIS — Z0000 Encounter for general adult medical examination without abnormal findings: Secondary | ICD-10-CM | POA: Diagnosis not present

## 2021-06-05 NOTE — Progress Notes (Signed)
Subjective:   Amanda [name] is a 75 y.o. female who presents for Medicare Annual (Subsequent) preventive examination.  Virtual Visit via Telephone Note  I connected with  Amanda [name] on 06/05/21 at  1:20 PM EDT by telephone and verified that I am speaking with the correct person using two identifiers.  Location: Patient: Home Provider: [facility] Persons participating in the virtual visit: patient/Nurse Health Advisor   I discussed the limitations, risks, security and privacy concerns of performing an evaluation and management service by telephone and the availability of in person appointments. The patient expressed understanding and agreed to proceed.  Interactive audio and video telecommunications were attempted between this nurse and patient, however failed, due to patient having technical difficulties OR patient did not have access to video capability.  We continued and completed visit with audio only.  Some vital signs may be absent or patient reported.   Amanda Coppola E Vong Garringer, LPN   Review of Systems     Cardiac Risk Factors include: advanced age (>71mn, >>22women);sedentary lifestyle     Objective:    Today's Vitals   06/05/21 1318  Weight: 125 lb (56.7 kg)  Height: '5\' 4"'$  (1.626 m)   Body mass index is 21.46 kg/m.  Advanced Directives 06/05/2021 05/14/2019 11/27/2018 10/31/2018 05/03/2018 08/15/2017 05/02/2017  Does Patient Have a Medical Advance Directive? Yes Yes Yes No Yes Yes Yes  Type of AParamedicof AF[place]Living will [place] will [place] Branch[place] will - [place]Living will Living will;Healthcare Power of [place]Living will  Does patient want to make changes to medical advance directive? - No - Patient declined - - - - -  Copy of [place] Chart? No - copy requested No - copy requested No - copy requested - No - copy  requested No - copy requested No - copy requested  Would patient like information on creating a medical advance directive? - - - No - Patient declined - - -    Current Medications (verified) Outpatient Encounter Medications as of 06/05/2021  Medication Sig   AMBULATORY NON FORMULARY MEDICATION Medication Name: Compression Stockings 20-360mg  1 pair (Patient not taking: Reported on 05/04/2021)   aspirin EC 325 MG tablet Take 325 mg by mouth daily as needed for moderate pain.   b complex vitamins tablet Take 1 tablet by mouth daily. MEGA FOOD BALANCE SOURCE   CALCIUM-MAGNESIUM-VITAMIN D PO Take 2 tablets by mouth 2 (two) times daily.   carbidopa-levodopa (SINEMET IR) 25-100 MG tablet Take 1 tablet by mouth 2 (two) times daily.   Cholecalciferol (VITAMIN D3) 1000 UNIT/SPRAY LIQD Take 2 sprays by mouth 2 (two) times daily.  (Patient not taking: Reported on 05/04/2021)   donepezil (ARICEPT) 5 MG tablet Take 5 mg by mouth daily.   famotidine (PEPCID) 20 MG tablet TAKE 1 TABLET BY MOUTH TWICE A DAY   letrozole (FEMARA) 2.5 MG tablet Take 1 tablet (2.5 mg total) by mouth daily.   pramipexole (MIRAPEX) 1 MG tablet Take 1 tablet by mouth 3 (three) times daily.   Probiotic Product (PROBIOTIC DAILY) CAPS Take 1 capsule by mouth daily as needed (indigestion).    Propylene Glycol-Glycerin (SOOTHE OP) Place 2 drops into both eyes daily as needed (dry eyes).   sertraline (ZOLOFT) 50 MG tablet Take 50 mg by mouth at bedtime.    tobramycin (TOBREX) 0.3 % ophthalmic solution Place 1 drop into both eyes every  6 (six) hours.   No facility-administered encounter medications on file as of 06/05/2021.    Allergies (verified) Penicillins   History: Past Medical History:  Diagnosis Date   Breast cancer, left breast ([place])    Frequency of urination    GAD (generalized anxiety disorder)    Osteopenia    Parkinson disease ([place])    neurologist-  dr Amanda [name]--  right side predominant   Parkinson disease  ([place])    Prolapse of vaginal wall    Urgency of urination    Wears glasses    Past Surgical History:  Procedure Laterality Date   ABDOMINAL HYSTERECTOMY     BREAST BIOPSY Left 07/2017   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   COLONOSCOPY  last one 2007   CYSTOCELE REPAIR N/A 01/11/2016   Procedure: ANTERIOR REPAIR (CYSTOCELE) COLOPLAST FASCIA  SACROSPINOUS FIXATION;  Surgeon: Amanda Clines, MD;  Location: [place];  Service: Urology;  Laterality: N/A;   CYSTOSCOPY W/ URETERAL STENT PLACEMENT Bilateral 01/11/2016   Procedure: CYSTOSCOPY WITH BILATERAL RETROGRADE PYELOGRAM/URETERAL STENT PLACEMENT;  Surgeon: Amanda Clines, MD;  Location: [place];  Service: Urology;  Laterality: Bilateral;   ESOPHAGOGASTRODUODENOSCOPY  2007   EXTERNAL EAR SURGERY Bilateral age 70   LAPAROSCOPIC OVARIAN CYSTECTOMY  March 2008   unilateral   MASTECTOMY Left 2018   PUBOVAGINAL SLING N/A 01/11/2016   Procedure:  ALTIS SLING SINGLE INCISION;  Surgeon: Amanda Clines, MD;  Location: [facility];  Service: Urology;  Laterality: N/A;   RECTOCELE REPAIR N/A 01/11/2016   Procedure: POSTERIOR REPAIR (RECTOCELE), PERINEAPLASTY;  Surgeon: Amanda Queen, MD;  Location: [place];  Service: Gynecology;  Laterality: N/A;   SIMPLE MASTECTOMY WITH AXILLARY SENTINEL NODE BIOPSY Left 11/08/2018   Procedure: LEFT SIMPLE MASTECTOMY WITH LEFT  AXILLARY SENTINEL NODE MAPPING;  Surgeon: Amanda Luna, MD;  Location: [place];  Service: General;  Laterality: Left;   VAGINAL HYSTERECTOMY N/A 01/11/2016   Procedure: TOTAL VAGINAL HYSTERECTOMY ;  Surgeon: Amanda Queen, MD;  Location: [place];  Service: Gynecology;  Laterality: N/A;   Family History  Problem Relation Age of Onset   Heart disease Mother        enlarged heart   Cancer Father        pancreatic   COPD Sister    Pulmonary embolism Sister    Edema Sister         Ankles   Heart disease Brother    Polycystic kidney disease Son    Social History   Socioeconomic History   Marital status: Married    Spouse name: Amanda [name]   Number of children: 3   Years of education: College   Highest education level: Not on file  Occupational History   Occupation: retired  Tobacco Use   Smoking status: Never   Smokeless tobacco: Never  Vaping Use   Vaping Use: Never used  Substance and Sexual Activity   Alcohol use: No   Drug use: No   Sexual activity: Not on file  Other Topics Concern   Not on file  Social History Narrative   Pt lives at home with family.Caffeine Use: some, occasionally   Has Parkinson's   Social Determinants of Health   Financial Resource Strain: Low Risk    Difficulty of Paying Living Expenses: Not hard at all  Food Insecurity: No Food Insecurity   Worried About Charity fundraiser in the Last Year: Never true  Ran Out of Food in the Last Year: Never true  Transportation Needs: No Transportation Needs   Lack of Transportation (Medical): No   Lack of Transportation (Non-Medical): No  Physical Activity: Inactive   Days of Exercise per Week: 0 days   Minutes of Exercise per Session: 0 min  Stress: Stress Concern Present   Feeling of Stress : To some extent  Social Connections: Moderately Integrated   Frequency of Communication with Friends and Family: More than three times a week   Frequency of Social Gatherings with Friends and Family: Once a week   Attends Religious Services: 1 to 4 times per year   Active Member of [employer] or Organizations: No   Attends Music therapist: Never   Marital Status: Married    Tobacco Counseling Counseling given: Not Answered   Clinical Intake:  Pre-visit preparation completed: Yes  Pain : No/denies pain     BMI - recorded: 21.46 Nutritional Status: BMI of 19-24  Normal Nutritional Risks: None  How often do you need to have someone help you when you read  instructions, pamphlets, or other written materials from your doctor or pharmacy?: 1 - Never  Diabetic? No  Interpreter Needed?: No  Information entered by :: Avram Danielson, LPN   Activities of Daily Living In your present state of health, do you have any difficulty performing the following activities: 06/05/2021 12/14/2020  Hearing? N N  Vision? N N  Difficulty concentrating or making decisions? Tempie Donning  Walking or climbing stairs? Y Y  Comment can do slowly holding rail -  Dressing or bathing? Y Y  Comment a little due to movement with Parkinson's -  Doing errands, shopping? N Y  Conservation officer, nature and eating ? N -  Using the Toilet? N -  In the past six months, have you accidently leaked urine? Y -  Do you have problems with loss of bowel control? N -  Managing your Medications? N -  Managing your Finances? N -  Housekeeping or managing your Housekeeping? N -  Some recent data might be hidden    Patient Care Team: Carollee Herter, Alferd Apa, DO as PCP - General Star Age, MD as Consulting Physician (Neurology) Amanda Queen, MD as Consulting Physician (Obstetrics and Gynecology) Lyndal Pulley, DO as Consulting Physician (Family Medicine) Eldridge Abrahams, MD as Consulting Physician (Neurology) Jola Schmidt, MD as Consulting Physician (Ophthalmology) Nicholas Lose, MD as Consulting Physician (Hematology and Oncology) Eppie Gibson, MD as Attending Physician (Radiation Oncology) Amanda Luna, MD as Consulting Physician (General Surgery)  Indicate any recent Medical Services you may have received from other than [facility] providers in the past year (date may be approximate).     Assessment:   This is a routine wellness examination for Amanda [name].  Hearing/Vision screen Hearing Screening - Comments:: Denies hearing difficulties  Vision Screening - Comments:: Wears glasses - up to date with annual eye exams with Dr Valetta Close  Dietary issues and exercise activities discussed: Current  Exercise Habits: The patient does not participate in regular exercise at present, Exercise limited by: neurologic condition(s);psychological condition(s)   Goals Addressed               This Visit's Progress     Increase physical activity (pt-stated)   Not on track     Starting 05/02/2017, I will start doing Parkinson's exercises with my husband and using the stationary bike 2-3 times weekly.       Depression Screen PHQ 2/9 Scores 06/05/2021  12/14/2020 05/14/2019 05/03/2018 08/15/2017 05/02/2017 03/04/2016  PHQ - 2 Score 0 0 0 0 0 1 0  PHQ- 9 Score - - - - - - -    Fall Risk Fall Risk  06/05/2021 05/04/2021 12/14/2020 05/14/2019 05/03/2018  Falls in the past year? '1 1 1 1 '$ Yes  Number falls in past yr: '1 1 1 1 2 '$ or more  Injury with Fall? 1 1 0 0 Yes  Comment - - - - -  Risk Factor Category  - - - - High Fall Risk  Risk for fall due to : Impaired balance/gait;History of fall(s) History of fall(s);Impaired balance/gait History of fall(s) Impaired balance/gait Impaired balance/gait  Risk for fall due to: Comment - - - - parkinsons  Follow up Education provided;Falls prevention discussed Falls evaluation completed Falls evaluation completed Education provided;Falls prevention discussed Education provided;Falls prevention discussed    FALL RISK PREVENTION PERTAINING TO THE HOME:  Any stairs in or around the home? Yes  If so, are there any without handrails? No  Home free of loose throw rugs in walkways, pet beds, electrical cords, etc? Yes  Adequate lighting in your home to reduce risk of falls? Yes   ASSISTIVE DEVICES UTILIZED TO PREVENT FALLS:  Life alert? Yes  Use of a cane, walker or w/c? Yes  Grab bars in the bathroom? Yes  Shower chair or bench in shower? Yes  Elevated toilet seat or a handicapped toilet? Yes   TIMED UP AND GO:  Was the test performed? No . Telephonic visit  Cognitive Function: Cognitive status assessed by direct observation. Patient has current diagnosis of  cognitive impairment. Patient is followed by neurology for ongoing assessment. Patient is unable to complete screening 6CIT or MMSE.   MMSE - Mini Mental State Exam 05/14/2019 05/03/2018 05/02/2017  Not completed: Refused - -  Orientation to time - 5 4  Orientation to time comments - - Disoriented to date, off by 1 day.  Orientation to Place - 5 5  Registration - 3 3  Attention/ Calculation - 5 5  Recall - 2 3  Language- name 2 objects - 2 2  Language- repeat - 1 1  Language- follow 3 step command - 3 3  Language- read & follow direction - 1 1  Write a sentence - 1 1  Copy design - - 1  Total score - - 29        Immunizations Immunization History  Administered Date(s) Administered   Fluad Quad(high Dose 65+) 05/24/2019   Influenza Split 08/17/2011   Influenza Whole 10/21/2009   Influenza, High Dose Seasonal PF 05/18/2017, 08/02/2018   Influenza,inj,Quad PF,6+ Mos 07/12/2013   Moderna Sars-Covid-2 Vaccination 10/24/2019, 11/22/2019, 08/25/2020   Pneumococcal Conjugate-13 03/05/2015   Pneumococcal Polysaccharide-23 03/04/2016   Td 04/22/2004, 10/21/2009   Tdap 05/09/2016   Zoster, Live 06/26/2008    TDAP status: Up to date  Flu Vaccine status: Due, Education has been provided regarding the importance of this vaccine. Advised may receive this vaccine at local pharmacy or Health Dept. Aware to provide a copy of the vaccination record if obtained from local pharmacy or Health Dept. Verbalized acceptance and understanding.  Pneumococcal vaccine status: Up to date  Covid-19 vaccine status: Completed vaccines  Qualifies for Shingles Vaccine? Yes   Zostavax completed Yes   Shingrix Completed?: No.    Education has been provided regarding the importance of this vaccine. Patient has been advised to call insurance company to determine out of pocket expense if  they have not yet received this vaccine. Advised may also receive vaccine at local pharmacy or Health Dept. Verbalized  acceptance and understanding.  Screening Tests Health Maintenance  Topic Date Due   Zoster Vaccines- Shingrix (1 of 2) Never done   Fecal DNA (Cologuard)  Never done   COVID-19 Vaccine (4 - Booster for Moderna series) 11/17/2020   INFLUENZA VACCINE  04/19/2021   MAMMOGRAM  01/29/2022   TETANUS/TDAP  05/09/2026   DEXA SCAN  Completed   Hepatitis C Screening  Completed   HPV VACCINES  Aged Out    Health Maintenance  Health Maintenance Due  Topic Date Due   Zoster Vaccines- Shingrix (1 of 2) Never done   Fecal DNA (Cologuard)  Never done   COVID-19 Vaccine (4 - Booster for Moderna series) 11/17/2020   INFLUENZA VACCINE  04/19/2021    Colorectal cancer screening: Type of screening: Colonoscopy. Completed 06/23/2014. Repeat every 10 years  Mammogram status: Completed 01/29/2021. Repeat every year  Bone Density status: Completed 06/05/2019. Results reflect: Bone density results: OSTEOPOROSIS. Repeat every 2 years.  Lung Cancer Screening: (Low Dose CT Chest recommended if Age 75-80 years, 30 pack-year currently smoking OR have quit w/in 15years.) does not qualify.   Additional Screening:  Hepatitis C Screening: does qualify; Completed 12/14/2020  Vision Screening: Recommended annual ophthalmology exams for early detection of glaucoma and other disorders of the eye. Is the patient up to date with their annual eye exam?  Yes  Who is the provider or what is the name of the office in which the patient attends annual eye exams? [facility] If pt is not established with a provider, would they like to be referred to a provider to establish care? No .   Dental Screening: Recommended annual dental exams for proper oral hygiene  Community Resource Referral / Chronic Care Management: CRR required this visit?  No   CCM required this visit?  No      Plan:     I have personally reviewed and noted the following in the patient's chart:   Medical and social history Use of alcohol, tobacco or  illicit drugs  Current medications and supplements including opioid prescriptions.  Functional ability and status Nutritional status Physical activity Advanced directives List of other physicians Hospitalizations, surgeries, and ER visits in previous 12 months Vitals Screenings to include cognitive, depression, and falls Referrals and appointments  In addition, I have reviewed and discussed with patient certain preventive protocols, quality metrics, and best practice recommendations. A written personalized care plan for preventive services as well as general preventive health recommendations were provided to patient.     Sandrea Hammond, LPN   [id number]   Nurse Notes: None

## 2021-06-05 NOTE — Patient Instructions (Signed)
Ms. Amanda [name] , Thank you for taking time to come for your Medicare Wellness Visit. I appreciate your ongoing commitment to your health goals. Please review the following plan we discussed and let me know if I can assist you in the future.   Screening recommendations/referrals: Colonoscopy: Done 06/23/2014 - Repeat in 10 years Mammogram: Done 01/29/2021 - Repeat annually Bone Density: Done 06/05/2019 - Repeat every 2 years is recommended, but discuss with Neurology or PCP - you may not be able to hold still long enough to do the exam. Recommended yearly ophthalmology/optometry visit for glaucoma screening and checkup Recommended yearly dental visit for hygiene and checkup  Vaccinations: Influenza vaccine: Due every fall Pneumococcal vaccine: Done 03/05/2015 & 03/04/2016 Tdap vaccine: Done 05/09/2016 - Repeat in 10 years Shingles vaccine: Zostavax done 2009 - Shingrix discussed. Please contact your pharmacy for coverage information.     Covid-19: Done 10/24/19, 11/22/19, & 08/25/2020  Advanced directives: Please bring a copy of your health care power of attorney and living will to the office to be added to your chart at your convenience.   Conditions/risks identified: Aim for 30 minutes of exercise or brisk walking each day, drink 6-8 glasses of water and eat lots of fruits and vegetables.   Next appointment: Follow up in one year for your annual wellness visit    Preventive Care 65 Years and Older, Female Preventive care refers to lifestyle choices and visits with your health care provider that can promote health and wellness. What does preventive care include? A yearly physical exam. This is also called an annual well check. Dental exams once or twice a year. Routine eye exams. Ask your health care provider how often you should have your eyes checked. Personal lifestyle choices, including: Daily care of your teeth and gums. Regular physical activity. Eating a healthy diet. Avoiding tobacco and  drug use. Limiting alcohol use. Practicing safe sex. Taking low-dose aspirin every day. Taking vitamin and mineral supplements as recommended by your health care provider. What happens during an annual well check? The services and screenings done by your health care provider during your annual well check will depend on your age, overall health, lifestyle risk factors, and family history of disease. Counseling  Your health care provider may ask you questions about your: Alcohol use. Tobacco use. Drug use. Emotional well-being. Home and relationship well-being. Sexual activity. Eating habits. History of falls. Memory and ability to understand (cognition). Work and work Statistician. Reproductive health. Screening  You may have the following tests or measurements: Height, weight, and BMI. Blood pressure. Lipid and cholesterol levels. These may be checked every 5 years, or more frequently if you are over 50 years old. Skin check. Lung cancer screening. You may have this screening every year starting at age 18 if you have a 30-pack-year history of smoking and currently smoke or have quit within the past 15 years. Fecal occult blood test (FOBT) of the stool. You may have this test every year starting at age 44. Flexible sigmoidoscopy or colonoscopy. You may have a sigmoidoscopy every 5 years or a colonoscopy every 10 years starting at age 27. Hepatitis C blood test. Hepatitis B blood test. Sexually transmitted disease (STD) testing. Diabetes screening. This is done by checking your blood sugar (glucose) after you have not eaten for a while (fasting). You may have this done every 1-3 years. Bone density scan. This is done to screen for osteoporosis. You may have this done starting at age 3. Mammogram. This may be  done every 1-2 years. Talk to your health care provider about how often you should have regular mammograms. Talk with your health care provider about your test results, treatment  options, and if necessary, the need for more tests. Vaccines  Your health care provider may recommend certain vaccines, such as: Influenza vaccine. This is recommended every year. Tetanus, diphtheria, and acellular pertussis (Tdap, Td) vaccine. You may need a Td booster every 10 years. Zoster vaccine. You may need this after age 39. Pneumococcal 13-valent conjugate (PCV13) vaccine. One dose is recommended after age 76. Pneumococcal polysaccharide (PPSV23) vaccine. One dose is recommended after age 54. Talk to your health care provider about which screenings and vaccines you need and how often you need them. This information is not intended to replace advice given to you by your health care provider. Make sure you discuss any questions you have with your health care provider. Document Released: 10/02/2015 Document Revised: 05/25/2016 Document Reviewed: 07/07/2015 Elsevier Interactive Patient Education  2017 [place] Prevention in the Home Falls can cause injuries. They can happen to people of all ages. There are many things you can do to make your home safe and to help prevent falls. What can I do on the outside of my home? Regularly fix the edges of walkways and driveways and fix any cracks. Remove anything that might make you trip as you walk through a door, such as a raised step or threshold. Trim any bushes or trees on the path to your home. Use bright outdoor lighting. Clear any walking paths of anything that might make someone trip, such as rocks or tools. Regularly check to see if handrails are loose or broken. Make sure that both sides of any steps have handrails. Any raised decks and porches should have guardrails on the edges. Have any leaves, snow, or ice cleared regularly. Use sand or salt on walking paths during winter. Clean up any spills in your garage right away. This includes oil or grease spills. What can I do in the bathroom? Use night lights. Install grab  bars by the toilet and in the tub and shower. Do not use towel bars as grab bars. Use non-skid mats or decals in the tub or shower. If you need to sit down in the shower, use a plastic, non-slip stool. Keep the floor dry. Clean up any water that spills on the floor as soon as it happens. Remove soap buildup in the tub or shower regularly. Attach bath mats securely with double-sided non-slip rug tape. Do not have throw rugs and other things on the floor that can make you trip. What can I do in the bedroom? Use night lights. Make sure that you have a light by your bed that is easy to reach. Do not use any sheets or blankets that are too big for your bed. They should not hang down onto the floor. Have a firm chair that has side arms. You can use this for support while you get dressed. Do not have throw rugs and other things on the floor that can make you trip. What can I do in the kitchen? Clean up any spills right away. Avoid walking on wet floors. Keep items that you use a lot in easy-to-reach places. If you need to reach something above you, use a strong step stool that has a grab bar. Keep electrical cords out of the way. Do not use floor polish or wax that makes floors slippery. If you must use wax,  use non-skid floor wax. Do not have throw rugs and other things on the floor that can make you trip. What can I do with my stairs? Do not leave any items on the stairs. Make sure that there are handrails on both sides of the stairs and use them. Fix handrails that are broken or loose. Make sure that handrails are as long as the stairways. Check any carpeting to make sure that it is firmly attached to the stairs. Fix any carpet that is loose or worn. Avoid having throw rugs at the top or bottom of the stairs. If you do have throw rugs, attach them to the floor with carpet tape. Make sure that you have a light switch at the top of the stairs and the bottom of the stairs. If you do not have them,  ask someone to add them for you. What else can I do to help prevent falls? Wear shoes that: Do not have high heels. Have rubber bottoms. Are comfortable and fit you well. Are closed at the toe. Do not wear sandals. If you use a stepladder: Make sure that it is fully opened. Do not climb a closed stepladder. Make sure that both sides of the stepladder are locked into place. Ask someone to hold it for you, if possible. Clearly mark and make sure that you can see: Any grab bars or handrails. First and last steps. Where the edge of each step is. Use tools that help you move around (mobility aids) if they are needed. These include: Canes. Walkers. Scooters. Crutches. Turn on the lights when you go into a dark area. Replace any light bulbs as soon as they burn out. Set up your furniture so you have a clear path. Avoid moving your furniture around. If any of your floors are uneven, fix them. If there are any pets around you, be aware of where they are. Review your medicines with your doctor. Some medicines can make you feel dizzy. This can increase your chance of falling. Ask your doctor what other things that you can do to help prevent falls. This information is not intended to replace advice given to you by your health care provider. Make sure you discuss any questions you have with your health care provider. Document Released: 07/02/2009 Document Revised: 02/11/2016 Document Reviewed: 10/10/2014 Elsevier Interactive Patient Education  2017 [employer].

## 2021-06-17 ENCOUNTER — Other Ambulatory Visit: Payer: Self-pay

## 2021-06-17 ENCOUNTER — Encounter: Payer: Self-pay | Admitting: Family Medicine

## 2021-06-17 ENCOUNTER — Other Ambulatory Visit: Payer: Self-pay | Admitting: Family Medicine

## 2021-06-17 ENCOUNTER — Ambulatory Visit (HOSPITAL_BASED_OUTPATIENT_CLINIC_OR_DEPARTMENT_OTHER)
Admission: RE | Admit: 2021-06-17 | Discharge: 2021-06-17 | Disposition: A | Discharge status: Home / Self Care | Payer: Medicare Other | Source: Ambulatory Visit | Attending: Family Medicine | Admitting: Family Medicine

## 2021-06-17 ENCOUNTER — Encounter: Payer: Self-pay | Admitting: Family

## 2021-06-17 ENCOUNTER — Ambulatory Visit (INDEPENDENT_AMBULATORY_CARE_PROVIDER_SITE_OTHER): Payer: Medicare Other | Admitting: Family Medicine

## 2021-06-17 VITALS — BP 113/82 | HR 79 | Temp 98.6°F | Resp 16 | Wt 123.0 lb

## 2021-06-17 DIAGNOSIS — M25571 Pain in right ankle and joints of right foot: Secondary | ICD-10-CM | POA: Insufficient documentation

## 2021-06-17 DIAGNOSIS — Z23 Encounter for immunization: Secondary | ICD-10-CM

## 2021-06-17 DIAGNOSIS — F419 Anxiety disorder, unspecified: Secondary | ICD-10-CM

## 2021-06-17 DIAGNOSIS — G20 Parkinson's disease: Secondary | ICD-10-CM | POA: Diagnosis not present

## 2021-06-17 MED ORDER — SERTRALINE HCL 50 MG PO TABS: 50.0000 mg | ORAL_TABLET | Freq: Every day | ORAL | 3 refills | Status: DC

## 2021-06-17 NOTE — Progress Notes (Signed)
Established Patient Office Visit  Subjective:  Patient ID: Amanda [name], female    DOB: [birth year]  Age: 75 y.o. MRN: [id number]  CC:  Chief Complaint  Patient presents with   Parkinso's desease    Here for follow up    HPI [name] presents for f/u and c/o r ankle pain   her husband is with her She stopped the zoloft a while back by accident .    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   COLONOSCOPY  last one 2007   CYSTOCELE REPAIR N/A 01/11/2016   Procedure: ANTERIOR REPAIR (CYSTOCELE) COLOPLAST FASCIA  SACROSPINOUS FIXATION;  Surgeon: Carolan Clines, MD;  Location: [place];  Service: Urology;  Laterality: N/A;   CYSTOSCOPY W/ URETERAL STENT PLACEMENT Bilateral 01/11/2016   Procedure: CYSTOSCOPY WITH BILATERAL RETROGRADE PYELOGRAM/URETERAL STENT PLACEMENT;  Surgeon: Carolan Clines, MD;  Location: [place];  Service: Urology;  Laterality: Bilateral;   ESOPHAGOGASTRODUODENOSCOPY  2007   EXTERNAL EAR SURGERY Bilateral age 27   LAPAROSCOPIC OVARIAN CYSTECTOMY  March 2008   unilateral   MASTECTOMY Left 2018   PUBOVAGINAL SLING N/A 01/11/2016   Procedure:  ALTIS SLING SINGLE INCISION;  Surgeon: Carolan Clines, MD;  Location: [facility];  Service: Urology;  Laterality: N/A;   RECTOCELE REPAIR N/A 01/11/2016   Procedure: POSTERIOR REPAIR (RECTOCELE), PERINEAPLASTY;  Surgeon: Dian Queen, MD;  Location: [place];  Service: Gynecology;  Laterality: N/A;   SIMPLE MASTECTOMY WITH AXILLARY SENTINEL NODE BIOPSY Left 11/08/2018   Procedure: LEFT SIMPLE MASTECTOMY WITH LEFT  AXILLARY SENTINEL NODE MAPPING;  Surgeon: Erroll Luna, MD;  Location: [place];  Service: General;  Laterality: Left;   VAGINAL  HYSTERECTOMY N/A 01/11/2016   Procedure: TOTAL VAGINAL HYSTERECTOMY ;  Surgeon: Dian Queen, MD;  Location: [place];  Service: Gynecology;  Laterality: N/A;    Family History  Problem Relation Age of Onset   Heart disease Mother        enlarged heart   Cancer Father        pancreatic   COPD Sister    Pulmonary embolism Sister    Edema Sister        Ankles   Heart disease Brother    Polycystic kidney disease Son     Social History   Socioeconomic History   Marital status: Married    Spouse name: Chrissie Noa   Number of children: 3   Years of education: College   Highest education level: Not on file  Occupational History   Occupation: retired  Tobacco Use   Smoking status: Never   Smokeless tobacco: Never  Vaping Use   Vaping Use: Never used  Substance and Sexual Activity   Alcohol use: No   Drug use: No   Sexual activity: Not on file  Other Topics Concern   Not on file  Social History Narrative   Pt lives at home with family.Caffeine Use: some, occasionally   Has Parkinson's   Social Determinants of Health   Financial Resource Strain: Low Risk    Difficulty of Paying Living Expenses: Not hard at all  Food Insecurity: No Food Insecurity   Worried About Charity fundraiser in the Last Year:  Never true   Ran Out of Food in the Last Year: Never true  Transportation Needs: No Transportation Needs   Lack of Transportation (Medical): No   Lack of Transportation (Non-Medical): No  Physical Activity: Inactive   Days of Exercise per Week: 0 days   Minutes of Exercise per Session: 0 min  Stress: Stress Concern Present   Feeling of Stress : To some extent  Social Connections: Moderately Integrated   Frequency of Communication with Friends and Family: More than three times a week   Frequency of Social Gatherings with Friends and Family: Once a week   Attends Religious Services: 1 to 4 times per year   Active Member of [employer] or Organizations: No    Attends Music therapist: Never   Marital Status: Married  Human resources officer Violence: Not At Risk   Fear of Current or Ex-Partner: No   Emotionally Abused: No   Physically Abused: No   Sexually Abused: No    Outpatient Medications Prior to Visit  Medication Sig Dispense Refill   AMBULATORY NON FORMULARY MEDICATION Medication Name: Compression Stockings 20-20mmHg  1 pair 1 each 0   aspirin EC 325 MG tablet Take 325 mg by mouth daily as needed for moderate pain.     b complex vitamins tablet Take 1 tablet by mouth daily. MEGA FOOD BALANCE SOURCE     CALCIUM-MAGNESIUM-VITAMIN D PO Take 2 tablets by mouth 2 (two) times daily.     carbidopa-levodopa (SINEMET IR) 25-100 MG tablet Take 1 tablet by mouth 2 (two) times daily.     Cholecalciferol (VITAMIN D3) 1000 UNIT/SPRAY LIQD Take 2 sprays by mouth 2 (two) times daily.     donepezil (ARICEPT) 5 MG tablet Take 5 mg by mouth daily.     famotidine (PEPCID) 20 MG tablet TAKE 1 TABLET BY MOUTH TWICE A DAY 180 tablet 1   letrozole (FEMARA) 2.5 MG tablet Take 1 tablet (2.5 mg total) by mouth daily. 90 tablet 3   pramipexole (MIRAPEX) 1 MG tablet Take 1 tablet by mouth 3 (three) times daily.  3   Probiotic Product (PROBIOTIC DAILY) CAPS Take 1 capsule by mouth daily as needed (indigestion).      Propylene Glycol-Glycerin (SOOTHE OP) Place 2 drops into both eyes daily as needed (dry eyes).     tobramycin (TOBREX) 0.3 % ophthalmic solution Place 1 drop into both eyes every 6 (six) hours. 5 mL 0   sertraline (ZOLOFT) 50 MG tablet Take 50 mg by mouth at bedtime.      No facility-administered medications prior to visit.    Allergies  Allergen Reactions   Penicillins     Leg pain Did it involve swelling of the face/tongue/throat, SOB, or low BP? No Did it involve sudden or severe rash/hives, skin peeling, or any reaction on the inside of your mouth or nose? No Did you need to seek medical attention at a hospital or doctor's office?  No When did it last happen?      childhood allergy If all above answers are "NO", may proceed with cephalosporin use.      ROS Review of Systems  Constitutional:  Negative for appetite change, diaphoresis, fatigue and unexpected weight change.  Eyes:  Negative for pain, redness and visual disturbance.  Respiratory:  Negative for cough, chest tightness, shortness of breath and wheezing.   Cardiovascular:  Negative for chest pain, palpitations and leg swelling.  Endocrine: Negative for cold intolerance, heat intolerance, polydipsia, polyphagia and polyuria.  Genitourinary:  Negative for difficulty urinating, dysuria and frequency.  Musculoskeletal:  Positive for gait problem.  Neurological:  Positive for tremors. Negative for dizziness, light-headedness, numbness and headaches.  Psychiatric/Behavioral:  Negative for self-injury and suicidal ideas. The patient is nervous/anxious.      Objective:    Physical Exam Vitals and nursing note reviewed.  Constitutional:      Appearance: She is well-developed.  HENT:     Head: Normocephalic and atraumatic.  Eyes:     Conjunctiva/sclera: Conjunctivae normal.  Neck:     Thyroid: No thyromegaly.     Vascular: No carotid bruit or JVD.  Cardiovascular:     Rate and Rhythm: Normal rate and regular rhythm.     Heart sounds: Normal heart sounds. No murmur heard. Pulmonary:     Effort: Pulmonary effort is normal. No respiratory distress.     Breath sounds: Normal breath sounds. No wheezing or rales.  Chest:     Chest wall: No tenderness.  Musculoskeletal:     Cervical back: Normal range of motion and neck supple.  Neurological:     Mental Status: She is alert and oriented to person, place, and time.  Psychiatric:        Attention and Perception: Attention normal.        Mood and Affect: Mood is anxious. Affect is tearful.        Speech: Speech normal.        Behavior: Behavior normal.        Thought Content: Thought content normal.    BP 113/82 (BP Location: Right Arm, Patient Position: Sitting, Cuff Size: Small)   Pulse 79   Temp 98.6 F (37 C) (Oral)   Resp 16   Wt 123 lb (55.8 kg)   SpO2 97%   BMI 21.11 kg/m  Wt Readings from Last 3 Encounters:  06/17/21 123 lb (55.8 kg)  06/05/21 125 lb (56.7 kg)  12/14/20 132 lb 9.6 oz (60.1 kg)     Health Maintenance Due  Topic Date Due   Zoster Vaccines- Shingrix (1 of 2) Never done   Fecal DNA (Cologuard)  Never done   COVID-19 Vaccine (4 - Booster for Moderna series) 11/17/2020    There are no preventive care reminders to display for this patient.  Lab Results  Component Value Date   TSH 2.39 12/14/2020   Lab Results  Component Value Date   WBC 6.3 12/14/2020   HGB 12.7 12/14/2020   HCT 38.8 12/14/2020   MCV 94.2 12/14/2020   PLT 182.0 12/14/2020   Lab Results  Component Value Date   NA 138 12/14/2020   K 4.0 12/14/2020   CO2 26 12/14/2020   GLUCOSE 97 12/14/2020   BUN 27 (H) 12/14/2020   CREATININE 0.80 12/14/2020   BILITOT 0.5 12/14/2020   ALKPHOS 61 12/14/2020   AST 18 12/14/2020   ALT 4 12/14/2020   PROT 6.3 12/14/2020   ALBUMIN 4.1 12/14/2020   CALCIUM 9.4 12/14/2020   ANIONGAP 11 10/31/2018   GFR 73.02 12/14/2020   Lab Results  Component Value Date   CHOL 177 12/14/2020   Lab Results  Component Value Date   HDL 64.30 12/14/2020   Lab Results  Component Value Date   LDLCALC 99 12/14/2020   Lab Results  Component Value Date   TRIG 65.0 12/14/2020   Lab Results  Component Value Date   CHOLHDL 3 12/14/2020   No results found for: HGBA1C    Assessment & Plan:  Problem List Items Addressed This Visit       Unprioritized   Acute right ankle pain    Check xray today aso splint        Relevant Orders   DG Ankle Complete Right   Anxiety - Primary    Restart zoloft F/u 1 month      Relevant Medications   sertraline (ZOLOFT) 50 MG tablet   Other Relevant Orders   TSH   Parkinson disease ([place])    Per neuro        Other Visit Diagnoses     Needs flu shot       Relevant Orders   Flu Vaccine QUAD High Dose(Fluad) (Completed)       Meds ordered this encounter  Medications   sertraline (ZOLOFT) 50 MG tablet    Sig: Take 1 tablet (50 mg total) by mouth at bedtime.    Dispense:  90 tablet    Refill:  3    Follow-up: Return in about 4 weeks (around 07/15/2021), or if symptoms worsen or fail to improve, for anxiety.    Ann Held, DO

## 2021-06-17 NOTE — Assessment & Plan Note (Signed)
Restart zoloft F/u 1 month

## 2021-06-17 NOTE — Assessment & Plan Note (Signed)
Check xray today aso splint

## 2021-06-17 NOTE — Patient Instructions (Signed)
Generalized Anxiety Disorder, Adult Generalized anxiety disorder (GAD) is a mental health condition. Unlike normal worries, anxiety related to GAD is not triggered by a specific event. These worries do not fade or get better with time. GAD interferes with relationships, work, and school. GAD symptoms can vary from mild to severe. People with severe GAD can have intense waves of anxiety with physical symptoms that are similar to panic attacks. What are the causes? The exact cause of GAD is not known, but the following are believed to have an impact: Differences in natural brain chemicals. Genes passed down from parents to children. Differences in the way threats are perceived. Development during childhood. Personality. What increases the risk? The following factors may make you more likely to develop this condition: Being female. Having a family history of anxiety disorders. Being very shy. Experiencing very stressful life events, such as the death of a loved one. Having a very stressful family environment. What are the signs or symptoms? People with GAD often worry excessively about many things in their lives, such as their health and family. Symptoms may also include: Mental and emotional symptoms: Worrying excessively about natural disasters. Fear of being late. Difficulty concentrating. Fears that others are judging your performance. Physical symptoms: Fatigue. Headaches, muscle tension, muscle twitches, trembling, or feeling shaky. Feeling like your heart is pounding or beating very fast. Feeling out of breath or like you cannot take a deep breath. Having trouble falling asleep or staying asleep, or experiencing restlessness. Sweating. Nausea, diarrhea, or irritable bowel syndrome (IBS). Behavioral symptoms: Experiencing erratic moods or irritability. Avoidance of new situations. Avoidance of people. Extreme difficulty making decisions. How is this diagnosed? This condition  is diagnosed based on your symptoms and medical history. You will also have a physical exam. Your health care provider may perform tests to rule out other possible causes of your symptoms. To be diagnosed with GAD, a person must have anxiety that: Is out of his or her control. Affects several different aspects of his or her life, such as work and relationships. Causes distress that makes him or her unable to take part in normal activities. Includes at least three symptoms of GAD, such as restlessness, fatigue, trouble concentrating, irritability, muscle tension, or sleep problems. Before your health care provider can confirm a diagnosis of GAD, these symptoms must be present more days than they are not, and they must last for 6 months or longer. How is this treated? This condition may be treated with: Medicine. Antidepressant medicine is usually prescribed for long-term daily control. Anti-anxiety medicines may be added in severe cases, especially when panic attacks occur. Talk therapy (psychotherapy). Certain types of talk therapy can be helpful in treating GAD by providing support, education, and guidance. Options include: Cognitive behavioral therapy (CBT). People learn coping skills and self-calming techniques to ease their physical symptoms. They learn to identify unrealistic thoughts and behaviors and to replace them with more appropriate thoughts and behaviors. Acceptance and commitment therapy (ACT). This treatment teaches people how to be mindful as a way to cope with unwanted thoughts and feelings. Biofeedback. This process trains you to manage your body's response (physiological response) through breathing techniques and relaxation methods. You will work with a therapist while machines are used to monitor your physical symptoms. Stress management techniques. These include yoga, meditation, and exercise. A mental health specialist can help determine which treatment is best for you. Some  people see improvement with one type of therapy. However, other people require a combination   of therapies. Follow these instructions at home: Lifestyle Maintain a consistent routine and schedule. Anticipate stressful situations. Create a plan, and allow extra time to work with your plan. Practice stress management or self-calming techniques that you have learned from your therapist or your health care provider. General instructions Take over-the-counter and prescription medicines only as told by your health care provider. Understand that you are likely to have setbacks. Accept this and be kind to yourself as you persist to take better care of yourself. Recognize and accept your accomplishments, even if you judge them as small. Keep all follow-up visits as told by your health care provider. This is important. Contact a health care provider if: Your symptoms do not get better. Your symptoms get worse. You have signs of depression, such as: A persistently sad or irritable mood. Loss of enjoyment in activities that used to bring you joy. Change in weight or eating. Changes in sleeping habits. Avoiding friends or family members. Loss of energy for normal tasks. Feelings of guilt or worthlessness. Get help right away if: You have serious thoughts about hurting yourself or others. If you ever feel like you may hurt yourself or others, or have thoughts about taking your own life, get help right away. Go to your nearest emergency department or: Call your local emergency services (911 in the [place] Call a suicide crisis helpline, such as the National Suicide Prevention Lifeline at 1-800-273-8255. This is open 24 hours a day in the [place] Text the Crisis Text Line at 741741 (in the [place] Summary Generalized anxiety disorder (GAD) is a mental health condition that involves worry that is not triggered by a specific event. People with GAD often worry excessively about many things in their lives, such  as their health and family. GAD may cause symptoms such as restlessness, trouble concentrating, sleep problems, frequent sweating, nausea, diarrhea, headaches, and trembling or muscle twitching. A mental health specialist can help determine which treatment is best for you. Some people see improvement with one type of therapy. However, other people require a combination of therapies. This information is not intended to replace advice given to you by your health care provider. Make sure you discuss any questions you have with your health care provider. Document Revised: 06/26/2019 Document Reviewed: 06/26/2019 Elsevier Patient Education  2022 [employer]vier Inc.  

## 2021-06-17 NOTE — Assessment & Plan Note (Signed)
Per neuro 

## 2021-06-18 LAB — TSH: TSH: 1.2 u[IU]/mL (ref 0.35–5.50)

## 2021-06-29 ENCOUNTER — Ambulatory Visit: Payer: Medicare Other | Admitting: Rehabilitative and Restorative Service Providers"

## 2021-07-21 ENCOUNTER — Telehealth: Payer: Self-pay | Admitting: Family Medicine

## 2021-07-21 NOTE — Telephone Encounter (Signed)
Pt called and stated we called her number listed, she seemed a bit disoriented, she thought it was for her daughter, she couldn't remember daughter birthday or address, after looking her up via phone number confirmed her name but she could not remember her own address, then she stated it may have been for her husband, and then hung up. After looking it does look like he needs an appt r/s, tried to call back but there was no answer, lvm for husband to r/s appt. Please advise.

## 2021-07-21 NOTE — Telephone Encounter (Signed)
Attempted to call back. Did not receive a answer. I did call pt's daughter, okay per DPR, left vm with some detail and advised her to call us back if she is able to get in contact with her mom

## 2021-07-22 NOTE — Telephone Encounter (Signed)
Spoke with husband, Amanda [name], okay per DPR.  Amanda [name] states patient is home with him currently. Reports patient has been tested for dementia and was found to be "in the normal range". Husband disagrees with result, states she is getting worse.

## 2021-08-06 ENCOUNTER — Encounter: Payer: Self-pay | Admitting: Obstetrics and Gynecology

## 2021-08-25 DIAGNOSIS — G249 Dystonia, unspecified: Secondary | ICD-10-CM | POA: Diagnosis not present

## 2021-08-25 DIAGNOSIS — Z79899 Other long term (current) drug therapy: Secondary | ICD-10-CM | POA: Diagnosis not present

## 2021-08-25 DIAGNOSIS — G20 Parkinson's disease: Secondary | ICD-10-CM | POA: Diagnosis not present

## 2021-08-25 DIAGNOSIS — G3184 Mild cognitive impairment, so stated: Secondary | ICD-10-CM | POA: Diagnosis not present

## 2021-10-21 ENCOUNTER — Other Ambulatory Visit: Payer: Self-pay | Admitting: Hematology and Oncology

## 2021-10-26 ENCOUNTER — Telehealth: Payer: Self-pay | Admitting: Hematology and Oncology

## 2021-10-26 NOTE — Telephone Encounter (Signed)
Sch per 1/31 inbasket, pt aware

## 2021-11-06 DIAGNOSIS — G3184 Mild cognitive impairment, so stated: Secondary | ICD-10-CM | POA: Diagnosis not present

## 2021-11-06 DIAGNOSIS — Z9181 History of falling: Secondary | ICD-10-CM | POA: Diagnosis not present

## 2021-11-06 DIAGNOSIS — G20 Parkinson's disease: Secondary | ICD-10-CM | POA: Diagnosis not present

## 2021-11-06 DIAGNOSIS — G249 Dystonia, unspecified: Secondary | ICD-10-CM | POA: Diagnosis not present

## 2021-11-09 ENCOUNTER — Telehealth: Payer: Self-pay

## 2021-11-09 ENCOUNTER — Telehealth: Payer: Self-pay | Admitting: Family Medicine

## 2021-11-09 DIAGNOSIS — G249 Dystonia, unspecified: Secondary | ICD-10-CM | POA: Diagnosis not present

## 2021-11-09 DIAGNOSIS — Z9181 History of falling: Secondary | ICD-10-CM | POA: Diagnosis not present

## 2021-11-09 DIAGNOSIS — G3184 Mild cognitive impairment, so stated: Secondary | ICD-10-CM | POA: Diagnosis not present

## 2021-11-09 DIAGNOSIS — G20 Parkinson's disease: Secondary | ICD-10-CM | POA: Diagnosis not present

## 2021-11-09 NOTE — Telephone Encounter (Signed)
Verbal given 

## 2021-11-09 NOTE — Telephone Encounter (Signed)
[facility] called to inform us pt had a recent fall Saturday 2/18, pt hit her head. Was advised to visit ED but daughter refused. Pt has a bruise on left buttock

## 2021-11-09 NOTE — Telephone Encounter (Signed)
VM left 

## 2021-11-09 NOTE — Telephone Encounter (Signed)
FYI.... I may have sent this to you already

## 2021-11-09 NOTE — Telephone Encounter (Signed)
Caller/Agency: [facility] Number: [phone] Requesting OT/PT/Skilled Nursing/Social Work/Speech Therapy: PT Frequency: 1 w 1, 2 w 3, 1 w 3

## 2021-11-09 NOTE — Telephone Encounter (Signed)
Christie Nottingham with [facility] [employer] was by to see the patient today and reported that patient fell on Sunday and only thing to report was a big bruise on patient's right hip.  PT was also by to see patient.  No other injuries to note.  Cathy's CB # is [id number].

## 2021-11-09 NOTE — Telephone Encounter (Signed)
Noted. FYI 

## 2021-11-12 DIAGNOSIS — G3184 Mild cognitive impairment, so stated: Secondary | ICD-10-CM | POA: Diagnosis not present

## 2021-11-12 DIAGNOSIS — Z9181 History of falling: Secondary | ICD-10-CM | POA: Diagnosis not present

## 2021-11-12 DIAGNOSIS — G249 Dystonia, unspecified: Secondary | ICD-10-CM | POA: Diagnosis not present

## 2021-11-12 DIAGNOSIS — G20 Parkinson's disease: Secondary | ICD-10-CM | POA: Diagnosis not present

## 2021-11-15 DIAGNOSIS — Z9181 History of falling: Secondary | ICD-10-CM | POA: Diagnosis not present

## 2021-11-15 DIAGNOSIS — G20 Parkinson's disease: Secondary | ICD-10-CM | POA: Diagnosis not present

## 2021-11-15 DIAGNOSIS — G249 Dystonia, unspecified: Secondary | ICD-10-CM | POA: Diagnosis not present

## 2021-11-15 DIAGNOSIS — G3184 Mild cognitive impairment, so stated: Secondary | ICD-10-CM | POA: Diagnosis not present

## 2021-11-15 NOTE — Telephone Encounter (Signed)
Received call from [place] at [facility] OT asking for verbal orders for OT 1-4. Ok per Dr. Carollee Herter verbal order given to start OT.

## 2021-11-16 DIAGNOSIS — G249 Dystonia, unspecified: Secondary | ICD-10-CM | POA: Diagnosis not present

## 2021-11-16 DIAGNOSIS — G3184 Mild cognitive impairment, so stated: Secondary | ICD-10-CM | POA: Diagnosis not present

## 2021-11-16 DIAGNOSIS — G20 Parkinson's disease: Secondary | ICD-10-CM | POA: Diagnosis not present

## 2021-11-16 DIAGNOSIS — Z9181 History of falling: Secondary | ICD-10-CM | POA: Diagnosis not present

## 2021-11-17 DIAGNOSIS — Z9181 History of falling: Secondary | ICD-10-CM | POA: Diagnosis not present

## 2021-11-17 DIAGNOSIS — G20 Parkinson's disease: Secondary | ICD-10-CM | POA: Diagnosis not present

## 2021-11-17 DIAGNOSIS — G249 Dystonia, unspecified: Secondary | ICD-10-CM | POA: Diagnosis not present

## 2021-11-17 DIAGNOSIS — G3184 Mild cognitive impairment, so stated: Secondary | ICD-10-CM | POA: Diagnosis not present

## 2021-11-17 NOTE — Progress Notes (Signed)
? ?Patient Care Team: ?Carollee Herter, Alferd Apa, DO as PCP - General ?Star Age, MD as Consulting Physician (Neurology) ?Dian Queen, MD as Consulting Physician (Obstetrics and Gynecology) ?Lyndal Pulley, DO as Consulting Physician (Family Medicine) ?Eldridge Abrahams, MD as Consulting Physician (Neurology) ?Jola Schmidt, MD as Consulting Physician (Ophthalmology) ?Nicholas Lose, MD as Consulting Physician (Hematology and Oncology) ?Eppie Gibson, MD as Attending Physician (Radiation Oncology) ?Erroll Luna, MD as Consulting Physician (General Surgery) ? ?DIAGNOSIS:  ?  ICD-10-CM   ?1. Malignant neoplasm of lower-outer quadrant of left breast of female, estrogen receptor positive ([place])  C50.512   ? Z17.0   ?  ? ? ?SUMMARY OF ONCOLOGIC HISTORY: ?Oncology History  ?Malignant neoplasm of lower-outer quadrant of left breast of female, estrogen receptor positive ([place])  ?07/21/2017 Initial Diagnosis  ? Left breast lower outer quadrant 5.4 x 2.3 x 3.8 cm mass along with additional satellite nodules measuring 6 mm and several other small satellite nodules, no lymphadenopathy, biopsy revealed invasive lobular cancer with LCIS grade 2, ER 95%, PR 60%, Ki-67 10%, HER-2 negative ratio 1.11, T3 N0 stage II a clinical stage AJCC 8 ?  ?08/21/2017 Oncotype testing  ? Oncotype Dx 10: ROR 7% ?  ?08/21/2017 -  Anti-estrogen oral therapy  ? Neoadjuvant letrozole ?  ?11/08/2018 Surgery  ? Left mastectomy: 5 cm invasive lobular cancer with 40% cellularity, grade 2, involves anterior margin which is skin, no LVI or PNI, 0/2 lymph nodes negative, ER 100%, PR 0%, Ki-67 5%, HER-2 -1+ by IHC, T2 N0 ?  ?11/20/2018 Cancer Staging  ? Staging form: Breast, AJCC 8th Edition ?- Pathologic stage from 11/20/2018: No Stage Recommended (ypT2, pN0, cM0, G2, ER+, PR-, HER2-, Oncotype DX score: 10) - Signed by Nicholas Lose, MD on 11/20/2018 ? ?  ?03/12/2019 - 04/24/2019 Radiation Therapy  ? Adjuvant radiation ?  ? ? ?CHIEF COMPLIANT: Follow-up of left  breast cancer ? ?INTERVAL HISTORY: Amanda [name] is a 76 y.o. with above-mentioned history of left breast cancer who took 1 year of neoadjuvant antiestrogen therapy with letrozole without interval response and subsequently underwent a left mastectomy, radiation, and is currently on letrozole. Mammogram on 01/29/2021 showed no evidence of malignancy in the right breast. She presents to the clinic today for follow-up.  She is tolerating anastrozole extremely well.  She will finish 5 years of therapy by December 2023. ? ?ALLERGIES:  is allergic to penicillins. ? ?MEDICATIONS:  ?Current Outpatient Medications  ?Medication Sig Dispense Refill  ? AMBULATORY NON FORMULARY MEDICATION Medication Name: Compression Stockings ?20-33mHg ? ?1 pair 1 each 0  ? aspirin EC 325 MG tablet Take 325 mg by mouth daily as needed for moderate pain.    ? b complex vitamins tablet Take 1 tablet by mouth daily. MEGA FOOD BALANCE SOURCE    ? CALCIUM-MAGNESIUM-VITAMIN D PO Take 2 tablets by mouth 2 (two) times daily.    ? carbidopa-levodopa (SINEMET IR) 25-100 MG tablet Take 1 tablet by mouth 2 (two) times daily.    ? Cholecalciferol (VITAMIN D3) 1000 UNIT/SPRAY LIQD Take 2 sprays by mouth 2 (two) times daily.    ? donepezil (ARICEPT) 5 MG tablet Take 5 mg by mouth daily.    ? famotidine (PEPCID) 20 MG tablet TAKE 1 TABLET BY MOUTH TWICE A DAY 180 tablet 1  ? letrozole (FEMARA) 2.5 MG tablet TAKE 1 TABLET BY MOUTH EVERY DAY 90 tablet 0  ? pramipexole (MIRAPEX) 1 MG tablet Take 1 tablet by mouth 3 (three) times daily.  3  ? Probiotic Product (PROBIOTIC DAILY) CAPS Take 1 capsule by mouth daily as needed (indigestion).     ? Propylene Glycol-Glycerin (SOOTHE OP) Place 2 drops into both eyes daily as needed (dry eyes).    ? sertraline (ZOLOFT) 50 MG tablet Take 1 tablet (50 mg total) by mouth at bedtime. 90 tablet 3  ? tobramycin (TOBREX) 0.3 % ophthalmic solution Place 1 drop into both eyes every 6 (six) hours. 5 mL 0  ? ?No current  facility-administered medications for this visit.  ? ? ?PHYSICAL EXAMINATION: ?ECOG PERFORMANCE STATUS: 1 - Symptomatic but completely ambulatory ? ?Vitals:  ? 11/18/21 1154  ?BP: 132/78  ?Pulse: 79  ?Resp: 18  ?Temp: 97.7 ?F (36.5 ?C)  ?SpO2: 96%  ? ?Filed Weights  ? 11/18/21 1154  ?Weight: 125 lb 14.4 oz (57.1 kg)  ? ?  ? ?LABORATORY DATA:  ?I have reviewed the data as listed ?CMP Latest Ref Rng & Units 12/14/2020 10/31/2018 08/18/2017  ?Glucose 70 - 99 mg/dL 97 93 98  ?BUN 6 - 23 mg/dL 27(H) 18 24(H)  ?Creatinine 0.40 - 1.20 mg/dL 0.80 0.92 0.99  ?Sodium 135 - 145 mEq/L 138 140 139  ?Potassium 3.5 - 5.1 mEq/L 4.0 4.4 3.8  ?Chloride 96 - 112 mEq/L 105 104 101  ?CO2 19 - 32 mEq/L 26 25 32  ?Calcium 8.4 - 10.5 mg/dL 9.4 9.3 9.5  ?Total Protein 6.0 - 8.3 g/dL 6.3 6.4(L) -  ?Total Bilirubin 0.2 - 1.2 mg/dL 0.5 0.9 -  ?Alkaline Phos 39 - 117 U/L 61 54 -  ?AST 0 - 37 U/L 18 32 -  ?ALT 0 - 35 U/L 4 <5 -  ? ? ?Lab Results  ?Component Value Date  ? WBC 6.3 12/14/2020  ? HGB 12.7 12/14/2020  ? HCT 38.8 12/14/2020  ? MCV 94.2 12/14/2020  ? PLT 182.0 12/14/2020  ? NEUTROABS 4.8 12/14/2020  ? ? ?ASSESSMENT & PLAN:  ?Malignant neoplasm of lower-outer quadrant of left breast of female, estrogen receptor positive ([place]) ?07/21/17: Left breast lower outer quadrant 5.4 x 2.3 x 3.8 cm mass along with additional satellite nodules measuring 6 mm and several other small satellite nodules, no lymphadenopathy, biopsy revealed invasive lobular cancer with LCIS grade 2, ER 95%, PR 60%, Ki-67 10%, HER-2 negative ratio 1.11, T3 N0 stage II a clinical stage AJCC 8 ?  ?Recommendation: ?1.  Neoadjuvant therapy with hormonal therapy based upon low risk Oncotype DX test result (score 10: 7% ROR) on letrozole 2.5 mg daily 08/21/2017-11/08/2018 ?2.  Followed by left mastectomy: 11/08/2018 5 cm invasive lobular cancer with 40% cellularity, grade 2, involves anterior margin which is skin, no LVI or PNI, 0/2 lymph nodes negative, ER 100%, PR 0%, Ki-67 5%,  HER-2 -1+ by IHC, T2 N0 ?3.  Followed by adjuvant radiation 03/12/2019-04/24/2019 ?5.  Followed by antiestrogen therapy with letrozole 1 mg daily started September 2020 ?---------------------------------------------------------------- ?Letrozole toxicities:no side effects of concern ?She will finish 5 years of therapy by December 2023 and after that she will stop it. ?   ?Breast cancer surveillance:  ?01/29/21: Right breast mammogram: Benign breast density category C ?Breast Exam: 11/17/21: Benign ?  ?History of Parkinson's disease: continues to be a challenge for her. Sees Dr.Siddiqui ?  ?Return to clinic on an as-needed basis ? ? ? ?No orders of the defined types were placed in this encounter. ? ?The patient has a good understanding of the overall plan. she agrees with it. she will call with any  problems that may develop before the next visit here. ? ?Total time spent: 30 mins including face to face time and time spent for planning, charting and coordination of care ? ?Rulon Eisenmenger, MD, MPH ?11/18/2021 ? ?I, Thana Ates, am acting as scribe for Dr. Nicholas Lose. ? ?I have reviewed the above documentation for accuracy and completeness, and I agree with the above. ? ? ? ? ? ? ?

## 2021-11-17 NOTE — Assessment & Plan Note (Signed)
07/21/17: Left breast lower outer quadrant 5.4 x 2.3 x 3.8 cm mass along with additional satellite nodules measuring 6 mm and several other small satellite nodules, no lymphadenopathy, biopsy revealed invasive lobular cancer with LCIS grade 2, ER 95%, PR 60%, Ki-67 10%, HER-2 negative ratio 1.11, T3 N0 stage II a clinical stage AJCC 8 ?? ?Recommendation: ?1. ?Neoadjuvant therapy with hormonal therapy based upon low risk?Oncotype DX test?result (score 10: 7% ROR) on letrozole 2.5 mg daily?08/21/2017-11/08/2018 ?2.??Followed by left mastectomy: 11/08/2018 5 cm invasive lobular cancer with 40% cellularity, grade 2, involves anterior margin which is skin, no LVI or PNI, 0/2 lymph nodes negative, ER 100%, PR 0%, Ki-67 5%, HER-2 -1+ by IHC, T2 N0 ?3. ?Followed by adjuvant radiation?03/12/2019-04/24/2019 ?5. ?Followed by antiestrogen therapy?with letrozole 1 mg daily started September 2020 ?---------------------------------------------------------------- ?Letrozole toxicities:no side effects of concern ?Lack of sexual desire. ?? ?Breast cancer surveillance:  ?01/29/21: Right breast mammogram: Benign breast density category C ?Breast Exam: 11/17/21: Benign ?? ?History of Parkinson's disease: continues to be a challenge for her. Sees Dr.Siddiqui ?? ?Return to clinic in 1 year for follow-up ?

## 2021-11-18 ENCOUNTER — Inpatient Hospital Stay: Payer: Medicare Other | Attending: Hematology and Oncology | Admitting: Hematology and Oncology

## 2021-11-18 ENCOUNTER — Other Ambulatory Visit: Payer: Self-pay

## 2021-11-18 DIAGNOSIS — Z170 Estrogen receptor positive status [ER+]: Secondary | ICD-10-CM | POA: Diagnosis not present

## 2021-11-18 DIAGNOSIS — C50512 Malignant neoplasm of lower-outer quadrant of left female breast: Secondary | ICD-10-CM | POA: Diagnosis not present

## 2021-11-18 DIAGNOSIS — G20 Parkinson's disease: Secondary | ICD-10-CM | POA: Insufficient documentation

## 2021-11-18 MED ORDER — LETROZOLE 2.5 MG PO TABS: 2.5000 mg | ORAL_TABLET | Freq: Every day | ORAL | 2 refills | Status: DC

## 2021-11-22 DIAGNOSIS — G249 Dystonia, unspecified: Secondary | ICD-10-CM | POA: Diagnosis not present

## 2021-11-22 DIAGNOSIS — G3184 Mild cognitive impairment, so stated: Secondary | ICD-10-CM | POA: Diagnosis not present

## 2021-11-22 DIAGNOSIS — Z9181 History of falling: Secondary | ICD-10-CM | POA: Diagnosis not present

## 2021-11-22 DIAGNOSIS — G20 Parkinson's disease: Secondary | ICD-10-CM | POA: Diagnosis not present

## 2021-11-24 DIAGNOSIS — G3184 Mild cognitive impairment, so stated: Secondary | ICD-10-CM | POA: Diagnosis not present

## 2021-11-24 DIAGNOSIS — Z9181 History of falling: Secondary | ICD-10-CM | POA: Diagnosis not present

## 2021-11-24 DIAGNOSIS — G249 Dystonia, unspecified: Secondary | ICD-10-CM | POA: Diagnosis not present

## 2021-11-24 DIAGNOSIS — G20 Parkinson's disease: Secondary | ICD-10-CM | POA: Diagnosis not present

## 2021-11-26 DIAGNOSIS — Z9181 History of falling: Secondary | ICD-10-CM | POA: Diagnosis not present

## 2021-11-26 DIAGNOSIS — R2689 Other abnormalities of gait and mobility: Secondary | ICD-10-CM | POA: Diagnosis not present

## 2021-11-26 DIAGNOSIS — G20 Parkinson's disease: Secondary | ICD-10-CM | POA: Diagnosis not present

## 2021-11-26 DIAGNOSIS — R441 Visual hallucinations: Secondary | ICD-10-CM | POA: Diagnosis not present

## 2021-11-26 DIAGNOSIS — Z79899 Other long term (current) drug therapy: Secondary | ICD-10-CM | POA: Diagnosis not present

## 2021-12-01 DIAGNOSIS — G3184 Mild cognitive impairment, so stated: Secondary | ICD-10-CM | POA: Diagnosis not present

## 2021-12-01 DIAGNOSIS — G20 Parkinson's disease: Secondary | ICD-10-CM | POA: Diagnosis not present

## 2021-12-01 DIAGNOSIS — Z9181 History of falling: Secondary | ICD-10-CM | POA: Diagnosis not present

## 2021-12-01 DIAGNOSIS — G249 Dystonia, unspecified: Secondary | ICD-10-CM | POA: Diagnosis not present

## 2021-12-02 DIAGNOSIS — G3184 Mild cognitive impairment, so stated: Secondary | ICD-10-CM | POA: Diagnosis not present

## 2021-12-02 DIAGNOSIS — G249 Dystonia, unspecified: Secondary | ICD-10-CM | POA: Diagnosis not present

## 2021-12-02 DIAGNOSIS — Z9181 History of falling: Secondary | ICD-10-CM | POA: Diagnosis not present

## 2021-12-02 DIAGNOSIS — G20 Parkinson's disease: Secondary | ICD-10-CM | POA: Diagnosis not present

## 2021-12-03 DIAGNOSIS — Z9181 History of falling: Secondary | ICD-10-CM | POA: Diagnosis not present

## 2021-12-03 DIAGNOSIS — G249 Dystonia, unspecified: Secondary | ICD-10-CM | POA: Diagnosis not present

## 2021-12-03 DIAGNOSIS — G3184 Mild cognitive impairment, so stated: Secondary | ICD-10-CM | POA: Diagnosis not present

## 2021-12-03 DIAGNOSIS — G20 Parkinson's disease: Secondary | ICD-10-CM | POA: Diagnosis not present

## 2021-12-06 ENCOUNTER — Telehealth: Payer: Self-pay | Admitting: Family Medicine

## 2021-12-06 ENCOUNTER — Telehealth: Payer: Self-pay

## 2021-12-06 DIAGNOSIS — Z9181 History of falling: Secondary | ICD-10-CM | POA: Diagnosis not present

## 2021-12-06 DIAGNOSIS — G249 Dystonia, unspecified: Secondary | ICD-10-CM | POA: Diagnosis not present

## 2021-12-06 DIAGNOSIS — G20 Parkinson's disease: Secondary | ICD-10-CM | POA: Diagnosis not present

## 2021-12-06 DIAGNOSIS — G3184 Mild cognitive impairment, so stated: Secondary | ICD-10-CM | POA: Diagnosis not present

## 2021-12-06 NOTE — Telephone Encounter (Signed)
VM left with confirmation of orders ?

## 2021-12-06 NOTE — Telephone Encounter (Signed)
Physical Therapist called in stated that pt fall Thursday or Friday of last week no injury. ?  ?

## 2021-12-06 NOTE — Telephone Encounter (Signed)
Caller/Agency: [facility]  ?Callback Number: [phone] ?Requesting OT/PT/Skilled Nursing/Social Work/Speech Therapy: PT ?Frequency: 1 x 2 ?

## 2021-12-09 ENCOUNTER — Telehealth: Payer: Self-pay | Admitting: Family Medicine

## 2021-12-09 DIAGNOSIS — G3184 Mild cognitive impairment, so stated: Secondary | ICD-10-CM | POA: Diagnosis not present

## 2021-12-09 DIAGNOSIS — G249 Dystonia, unspecified: Secondary | ICD-10-CM | POA: Diagnosis not present

## 2021-12-09 DIAGNOSIS — G20 Parkinson's disease: Secondary | ICD-10-CM | POA: Diagnosis not present

## 2021-12-09 DIAGNOSIS — Z9181 History of falling: Secondary | ICD-10-CM | POA: Diagnosis not present

## 2021-12-09 NOTE — Telephone Encounter (Signed)
[facility]/ [facility] called for vo to extend OT 1x4 starting Monday 3/27. [place]- [phone] okay to leave voicemail. Please advise  ?

## 2021-12-09 NOTE — Telephone Encounter (Signed)
Verbal given 

## 2021-12-15 DIAGNOSIS — G3184 Mild cognitive impairment, so stated: Secondary | ICD-10-CM | POA: Diagnosis not present

## 2021-12-15 DIAGNOSIS — G249 Dystonia, unspecified: Secondary | ICD-10-CM | POA: Diagnosis not present

## 2021-12-15 DIAGNOSIS — G20 Parkinson's disease: Secondary | ICD-10-CM | POA: Diagnosis not present

## 2021-12-15 DIAGNOSIS — Z9181 History of falling: Secondary | ICD-10-CM | POA: Diagnosis not present

## 2021-12-22 DIAGNOSIS — G3184 Mild cognitive impairment, so stated: Secondary | ICD-10-CM | POA: Diagnosis not present

## 2021-12-22 DIAGNOSIS — Z9181 History of falling: Secondary | ICD-10-CM | POA: Diagnosis not present

## 2021-12-22 DIAGNOSIS — G249 Dystonia, unspecified: Secondary | ICD-10-CM | POA: Diagnosis not present

## 2021-12-22 DIAGNOSIS — G20 Parkinson's disease: Secondary | ICD-10-CM | POA: Diagnosis not present

## 2021-12-28 DIAGNOSIS — G3184 Mild cognitive impairment, so stated: Secondary | ICD-10-CM | POA: Diagnosis not present

## 2021-12-28 DIAGNOSIS — Z9181 History of falling: Secondary | ICD-10-CM | POA: Diagnosis not present

## 2021-12-28 DIAGNOSIS — G20 Parkinson's disease: Secondary | ICD-10-CM | POA: Diagnosis not present

## 2021-12-28 DIAGNOSIS — G249 Dystonia, unspecified: Secondary | ICD-10-CM | POA: Diagnosis not present

## 2022-01-09 DIAGNOSIS — R300 Dysuria: Secondary | ICD-10-CM | POA: Diagnosis not present

## 2022-01-10 ENCOUNTER — Other Ambulatory Visit (INDEPENDENT_AMBULATORY_CARE_PROVIDER_SITE_OTHER): Payer: Medicare Other

## 2022-01-10 ENCOUNTER — Telehealth: Payer: Self-pay

## 2022-01-10 ENCOUNTER — Telehealth: Payer: Self-pay | Admitting: Family Medicine

## 2022-01-10 ENCOUNTER — Other Ambulatory Visit: Payer: Self-pay

## 2022-01-10 DIAGNOSIS — R300 Dysuria: Secondary | ICD-10-CM

## 2022-01-10 LAB — POC URINALSYSI DIPSTICK (AUTOMATED)
Bilirubin, UA: NEGATIVE
Blood, UA: NEGATIVE
Glucose, UA: NEGATIVE
Ketones, UA: NEGATIVE
Nitrite, UA: POSITIVE
Protein, UA: NEGATIVE
Spec Grav, UA: 1.01 (ref 1.010–1.025)
Urobilinogen, UA: 0.2 E.U./dL
pH, UA: 6 (ref 5.0–8.0)

## 2022-01-10 MED ORDER — NITROFURANTOIN MONOHYD MACRO 100 MG PO CAPS: 100.0000 mg | ORAL_CAPSULE | Freq: Two times a day (BID) | ORAL | 0 refills | Status: DC

## 2022-01-10 NOTE — Addendum Note (Signed)
Addended by: Sanda Linger on: 01/10/2022 02:50 PM ? ? Modules accepted: Orders ? ?

## 2022-01-10 NOTE — Telephone Encounter (Signed)
Patient's daughter called for her mom stating she has an UTI and when they took her to an urgent care over the weekend they were unable to get a urine sample. She states she has a sterile cup that they have been waiting to get a urine sample on but so far her mom has not been able to pee in the cup since she is peeing on the bed / depends. She is unsure on what to do and will try to bring pt for an appt but is unsure since she hasn't been able to get a sample yet. She would like advise on what to do. Please advise.  ?

## 2022-01-10 NOTE — Telephone Encounter (Signed)
Pt's daughter would like the UTI medication to be sent to [facility] on [address] [place], [place] [address]. Please advise.  ?

## 2022-01-10 NOTE — Telephone Encounter (Signed)
Rx sent 

## 2022-01-10 NOTE — Telephone Encounter (Signed)
Pt's daughter called stating her mom just peed in the sterile cup. She would like to know if she is able to bring it in. Please advise.  ?

## 2022-01-10 NOTE — Telephone Encounter (Signed)
Spoke with patient's daughter and confirmed sample is in a sterile cup. Pt put on lab schedule for today to drop off urine ?

## 2022-01-10 NOTE — Telephone Encounter (Signed)
Amanda [name] ?Gender: FemaleDOB: 1946/02/23 ?Age: 76 Y 49 M 20 D ?ReturnPhoneNumber: ?[phone]/  ?Caller Name Traci Sermon ?Relationship To Patient Daughter ?Return Phone Number [phone] (Primary) ?Chief Complaint Urination Pain ?Initial Comment Caller states her mom's urine is smelling bad, shetested positive for a UTI at home. ?Confirm and document reason for call. If ?symptomatic, describe symptoms. ?---Caller states her mom's urine is smelling bad, she ?tested positive for a UTI at home. States no other ?symptoms like burning but is having urgency. No ?fever. Symptoms started around [place] ?

## 2022-01-10 NOTE — Telephone Encounter (Signed)
Pt put on lab schedule  ?

## 2022-01-12 LAB — URINE CULTURE
MICRO NUMBER:: 13302130
SPECIMEN QUALITY:: ADEQUATE

## 2022-02-01 ENCOUNTER — Other Ambulatory Visit: Payer: Self-pay | Admitting: Hematology and Oncology

## 2022-02-01 DIAGNOSIS — Z1231 Encounter for screening mammogram for malignant neoplasm of breast: Secondary | ICD-10-CM

## 2022-02-10 ENCOUNTER — Ambulatory Visit
Admission: RE | Admit: 2022-02-10 | Discharge: 2022-02-10 | Disposition: A | Discharge status: Home / Self Care | Payer: Medicare Other | Source: Ambulatory Visit | Attending: Hematology and Oncology | Admitting: Hematology and Oncology

## 2022-02-10 DIAGNOSIS — Z1231 Encounter for screening mammogram for malignant neoplasm of breast: Secondary | ICD-10-CM

## 2022-02-14 ENCOUNTER — Encounter: Payer: Self-pay | Admitting: Family Medicine

## 2022-02-21 ENCOUNTER — Telehealth: Payer: Self-pay | Admitting: *Deleted

## 2022-02-21 ENCOUNTER — Ambulatory Visit: Payer: Medicare Other | Admitting: Family Medicine

## 2022-02-21 ENCOUNTER — Other Ambulatory Visit: Payer: Self-pay | Admitting: Family Medicine

## 2022-02-21 ENCOUNTER — Ambulatory Visit (INDEPENDENT_AMBULATORY_CARE_PROVIDER_SITE_OTHER): Payer: Medicare Other | Admitting: Family Medicine

## 2022-02-21 ENCOUNTER — Encounter: Payer: Self-pay | Admitting: Family Medicine

## 2022-02-21 VITALS — BP 118/68 | HR 61 | Temp 97.7°F | Resp 18 | Ht 64.0 in | Wt 126.4 lb

## 2022-02-21 DIAGNOSIS — Z1211 Encounter for screening for malignant neoplasm of colon: Secondary | ICD-10-CM | POA: Diagnosis not present

## 2022-02-21 DIAGNOSIS — N390 Urinary tract infection, site not specified: Secondary | ICD-10-CM | POA: Diagnosis not present

## 2022-02-21 DIAGNOSIS — Z136 Encounter for screening for cardiovascular disorders: Secondary | ICD-10-CM | POA: Insufficient documentation

## 2022-02-21 DIAGNOSIS — R296 Repeated falls: Secondary | ICD-10-CM | POA: Diagnosis not present

## 2022-02-21 DIAGNOSIS — G20 Parkinson's disease: Secondary | ICD-10-CM

## 2022-02-21 DIAGNOSIS — Z0000 Encounter for general adult medical examination without abnormal findings: Secondary | ICD-10-CM | POA: Diagnosis not present

## 2022-02-21 DIAGNOSIS — R32 Unspecified urinary incontinence: Secondary | ICD-10-CM

## 2022-02-21 DIAGNOSIS — M8580 Other specified disorders of bone density and structure, unspecified site: Secondary | ICD-10-CM | POA: Diagnosis not present

## 2022-02-21 LAB — POC URINALSYSI DIPSTICK (AUTOMATED)
Bilirubin, UA: NEGATIVE
Blood, UA: NEGATIVE
Glucose, UA: NEGATIVE
Ketones, UA: NEGATIVE
Leukocytes, UA: NEGATIVE
Nitrite, UA: POSITIVE
Protein, UA: NEGATIVE
Spec Grav, UA: 1.025 (ref 1.010–1.025)
Urobilinogen, UA: 0.2 E.U./dL
pH, UA: 6 (ref 5.0–8.0)

## 2022-02-21 MED ORDER — SOLIFENACIN SUCCINATE 5 MG PO TABS: 5.0000 mg | ORAL_TABLET | Freq: Every day | ORAL | 1 refills | Status: DC

## 2022-02-21 NOTE — Progress Notes (Signed)
Subjective:   By signing my name below, I, Amanda [name], attest that this documentation has been prepared under the direction and in the presence of Amanda Schanz DO, 02/21/2022   Patient ID: Amanda [name], female    DOB: 18-Aug-1946, 76 y.o.   MRN: [id number]  No chief complaint on file.   HPI Patient is in today for an office visit. She is accompanied by her husband.   She complains of urinary incontinence that begun a few months ago. She states that she could not urinate. She did have a UTI recently but is unsure how long it was present before treatment.   She complains of neck pain which she believes could be due to falling. She has hit her head but reports that it has not been recent.   She states that she is doing better now. She is not interested in starting physical therapy but believes that it is necessary. Her daughters sent me a message requesting a neuro referral for [facility] for [facility].   She reports that she is still following up with Dr. Helane Rima. She reports that she has not scheduled an appointment with Dr. Helane Rima yet. She also still follows up with Dr. Valetta Close.   She states that her last fall was about two months ago. She states that she fell due to her walker. She reports that pain on her left rib begun later than she had her fall. She states that she still has pain on the left side of her ribs. She reports that she has trouble remembering. She uses a walker at home but states that she does not like it. She feels like she is going to fall when she uses it. Her walker has two wheels with skis.   She reports that she has not received any new surgeries.  She states that she can't bring herself to do a Cologuard. She states that she has the Cologuard at home. She is not interested in receiving a colonoscopy.  She reports that her last completed mammogram was on 02/10/2022 She has received three Covid 19 vaccines. Her husband reports that she has not received the new Shingles  vaccine.     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   COLONOSCOPY  last one 2007   CYSTOCELE REPAIR N/A 01/11/2016   Procedure: ANTERIOR REPAIR (CYSTOCELE) COLOPLAST FASCIA  SACROSPINOUS FIXATION;  Surgeon: Carolan Clines, MD;  Location: [facility];  Service: Urology;  Laterality: N/A;   CYSTOSCOPY W/ URETERAL STENT PLACEMENT Bilateral 01/11/2016   Procedure: CYSTOSCOPY WITH BILATERAL RETROGRADE PYELOGRAM/URETERAL STENT PLACEMENT;  Surgeon: Carolan Clines, MD;  Location: [facility];  Service: Urology;  Laterality: Bilateral;   ESOPHAGOGASTRODUODENOSCOPY  2007   EXTERNAL EAR SURGERY Bilateral age 89   LAPAROSCOPIC OVARIAN CYSTECTOMY  March 2008   unilateral   MASTECTOMY Left 2018   PUBOVAGINAL SLING N/A 01/11/2016   Procedure:  ALTIS SLING SINGLE INCISION;  Surgeon: Carolan Clines, MD;  Location[facility];  Service: Urology;  Laterality: N/A;   RECTOCELE REPAIR N/A 01/11/2016   Procedure: POSTERIOR REPAIR (RECTOCELE), PERINEAPLASTY;  Surgeon: Dian Queen, MD;  Location: [facility];  Service: Gynecology;  Laterality: N/A;   SIMPLE MASTECTOMY WITH AXILLARY SENTINEL NODE BIOPSY Left 11/08/2018   Procedure: LEFT SIMPLE MASTECTOMY WITH LEFT  AXILLARY SENTINEL NODE MAPPING;  Surgeon: Erroll Luna, MD;  Location: [facility];  Service: General;  Laterality: Left;   VAGINAL HYSTERECTOMY N/A 01/11/2016   Procedure: TOTAL VAGINAL HYSTERECTOMY ;  Surgeon: Dian Queen,  MD;  Location: [facility];  Service: Gynecology;  Laterality: N/A;    Family History  Problem Relation Age of Onset   Heart disease Mother        enlarged heart   Cancer Father        pancreatic   COPD Sister    Pulmonary embolism Sister    Edema Sister        Ankles   Heart disease Brother    Polycystic kidney disease Son     Social History   Socioeconomic History   Marital status: Married    Spouse name: Chrissie Noa   Number of children: 3   Years of education: College   Highest education level: Not on file  Occupational History   Occupation: retired  Tobacco Use   Smoking status: Never   Smokeless tobacco: Never  Vaping Use   Vaping Use: Never used  Substance and Sexual Activity   Alcohol use: No   Drug use: No   Sexual activity: Not on file  Other Topics Concern   Not on file  Social History Narrative   Pt lives at home with family.Caffeine Use: some, occasionally   Has Parkinson's   Social Determinants of Health   Financial Resource Strain: Low Risk    Difficulty of Paying Living Expenses: Not hard at all  Food Insecurity: No Food Insecurity   Worried About Charity fundraiser in the Last Year: Never true   [place] in the Last Year: Never true  Transportation Needs: No Transportation Needs   Lack of Transportation (Medical): No   Lack of Transportation (Non-Medical): No  Physical Activity: Inactive   Days of Exercise per Week: 0 days   Minutes of Exercise per Session: 0 min  Stress: Stress Concern Present   Feeling of Stress : To some extent  Social Connections: Moderately Integrated   Frequency of Communication with Friends and Family: More than three times a week   Frequency of Social Gatherings with Friends and Family: Once a week   Attends Religious Services: 1 to 4 times per year   Active Member of [employer] or Organizations: No   Attends Music therapist: Never   Marital Status: Married  Human resources officer Violence:  Not At Risk   Fear of Current or Ex-Partner: No   Emotionally Abused: No   Physically Abused: No   Sexually Abused: No    Outpatient Medications Prior to Visit  Medication Sig Dispense Refill   AMBULATORY NON FORMULARY MEDICATION Medication Name: Compression Stockings 20-19mHg  1 pair 1 each 0   aspirin EC 325 MG tablet Take 325 mg by mouth daily as needed for moderate pain.     b complex vitamins tablet Take 1 tablet by mouth daily. MEGA FOOD BALANCE SOURCE     CALCIUM-MAGNESIUM-VITAMIN D PO Take 2 tablets by mouth 2 (two) times daily.  carbidopa-levodopa (SINEMET IR) 25-100 MG tablet Take 1 tablet by mouth 2 (two) times daily.     Cholecalciferol (VITAMIN D3) 1000 UNIT/SPRAY LIQD Take 2 sprays by mouth 2 (two) times daily.     donepezil (ARICEPT) 5 MG tablet Take 5 mg by mouth daily.     famotidine (PEPCID) 20 MG tablet TAKE 1 TABLET BY MOUTH TWICE A DAY 180 tablet 1   letrozole (FEMARA) 2.5 MG tablet Take 1 tablet (2.5 mg total) by mouth daily. 90 tablet 2   pramipexole (MIRAPEX) 1 MG tablet Take 1 tablet by mouth 3 (three) times daily.  3   Probiotic Product (PROBIOTIC DAILY) CAPS Take 1 capsule by mouth daily as needed (indigestion).      Propylene Glycol-Glycerin (SOOTHE OP) Place 2 drops into both eyes daily as needed (dry eyes).     sertraline (ZOLOFT) 50 MG tablet Take 1 tablet (50 mg total) by mouth at bedtime. 90 tablet 3   tobramycin (TOBREX) 0.3 % ophthalmic solution Place 1 drop into both eyes every 6 (six) hours. 5 mL 0   nitrofurantoin, macrocrystal-monohydrate, (MACROBID) 100 MG capsule Take 1 capsule (100 mg total) by mouth 2 (two) times daily. 14 capsule 0   No facility-administered medications prior to visit.    Allergies  Allergen Reactions   Penicillins     Leg pain Did it involve swelling of the face/tongue/throat, SOB, or low BP? No Did it involve sudden or severe rash/hives, skin peeling, or any reaction on the inside of your mouth or nose? No Did you  need to seek medical attention at a hospital or doctor's office? No When did it last happen?      childhood allergy If all above answers are "NO", may proceed with cephalosporin use.      Review of Systems  Constitutional:  Negative for fever and malaise/fatigue.  HENT:  Negative for congestion.   Eyes:  Negative for blurred vision.  Respiratory:  Negative for shortness of breath.   Cardiovascular:  Negative for chest pain, palpitations and leg swelling.  Gastrointestinal:  Negative for abdominal pain, blood in stool and nausea.  Genitourinary:  Negative for dysuria and frequency.       (+) Urinary Incontinence  Musculoskeletal:  Positive for neck pain. Negative for falls.       (+) Pain on left rib  Skin:  Negative for rash.  Neurological:  Positive for weakness. Negative for dizziness, loss of consciousness and headaches.  Endo/Heme/Allergies:  Negative for environmental allergies.  Psychiatric/Behavioral:  Positive for memory loss. Negative for depression. The patient is not nervous/anxious.       Objective:    Physical Exam Vitals and nursing note reviewed.  Constitutional:      General: She is not in acute distress.    Appearance: Normal appearance. She is not ill-appearing.  HENT:     Head: Normocephalic and atraumatic.     Right Ear: External ear normal.     Left Ear: External ear normal.  Eyes:     Extraocular Movements: Extraocular movements intact.     Pupils: Pupils are equal, round, and reactive to light.  Cardiovascular:     Rate and Rhythm: Normal rate and regular rhythm.     Heart sounds: Normal heart sounds. No murmur heard.   No gallop.  Pulmonary:     Effort: Pulmonary effort is normal. No respiratory distress.     Breath sounds: Normal breath sounds. No wheezing or rales.  Abdominal:  General: There is no distension.     Tenderness: There is no abdominal tenderness. There is no guarding or rebound.  Musculoskeletal:     Comments: Pt was in a  wheelchair Walks with walker at home but it gets away from her frequently and she has fallen frequently  Skin:    General: Skin is warm and dry.  Neurological:     Mental Status: She is alert and oriented to person, place, and time.     Comments: Answering questions appropriately today  Psychiatric:        Mood and Affect: Mood normal.        Judgment: Judgment normal.    BP 118/68 (BP Location: Right Arm, Patient Position: Sitting, Cuff Size: Normal)   Pulse 61   Temp 97.7 F (36.5 C) (Oral)   Resp 18   Ht '5\' 4"'$  (1.626 m)   Wt 126 lb 6.4 oz (57.3 kg)   SpO2 98%   BMI 21.70 kg/m  Wt Readings from Last 3 Encounters:  02/21/22 126 lb 6.4 oz (57.3 kg)  11/18/21 125 lb 14.4 oz (57.1 kg)  06/17/21 123 lb (55.8 kg)    Diabetic Foot Exam - Simple   No data filed    Lab Results  Component Value Date   WBC 6.3 12/14/2020   HGB 12.7 12/14/2020   HCT 38.8 12/14/2020   PLT 182.0 12/14/2020   GLUCOSE 97 12/14/2020   CHOL 177 12/14/2020   TRIG 65.0 12/14/2020   HDL 64.30 12/14/2020   LDLCALC 99 12/14/2020   ALT 4 12/14/2020   AST 18 12/14/2020   NA 138 12/14/2020   K 4.0 12/14/2020   CL 105 12/14/2020   CREATININE 0.80 12/14/2020   BUN 27 (H) 12/14/2020   CO2 26 12/14/2020   TSH 1.20 06/17/2021    Lab Results  Component Value Date   TSH 1.20 06/17/2021   Lab Results  Component Value Date   WBC 6.3 12/14/2020   HGB 12.7 12/14/2020   HCT 38.8 12/14/2020   MCV 94.2 12/14/2020   PLT 182.0 12/14/2020   Lab Results  Component Value Date   NA 138 12/14/2020   K 4.0 12/14/2020   CO2 26 12/14/2020   GLUCOSE 97 12/14/2020   BUN 27 (H) 12/14/2020   CREATININE 0.80 12/14/2020   BILITOT 0.5 12/14/2020   ALKPHOS 61 12/14/2020   AST 18 12/14/2020   ALT 4 12/14/2020   PROT 6.3 12/14/2020   ALBUMIN 4.1 12/14/2020   CALCIUM 9.4 12/14/2020   ANIONGAP 11 10/31/2018   GFR 73.02 12/14/2020   Lab Results  Component Value Date   CHOL 177 12/14/2020   Lab Results   Component Value Date   HDL 64.30 12/14/2020   Lab Results  Component Value Date   LDLCALC 99 12/14/2020   Lab Results  Component Value Date   TRIG 65.0 12/14/2020   Lab Results  Component Value Date   CHOLHDL 3 12/14/2020   No results found for: HGBA1C     Assessment & Plan:   Problem List Items Addressed This Visit       Unprioritized   Urinary incontinence    Referral to urology ua done--  May have uti-- pt has no symptoms  Culture pending         Relevant Medications   solifenacin (VESICARE) 5 MG tablet   Other Relevant Orders   POCT Urinalysis Dipstick (Automated) (Completed)   Ambulatory referral to Urology   Urine Culture  CBC with Differential/Platelet   Comprehensive metabolic panel   Lipid panel   Vitamin B12   Screening, ischemic heart disease - Primary   Relevant Orders   Lipid panel   Vitamin B12   Parkinson disease ([place])    Per neuro       Relevant Orders   Ambulatory referral to Neurology   Ambulatory referral to [facility]   CBC with Differential/Platelet   Comprehensive metabolic panel   Lipid panel   Vitamin B12   Osteopenia    bmd done with gyn Pt should get 1200 mg calcium either in diet or supplement as well as vita d3 1000 u daily  Weight bearing exercise will help also but pt is too weak and unsteady on her feet PT/ OT referral placed        Frequent falls    HH referral placed for OT and PT         Relevant Orders   POCT Urinalysis Dipstick (Automated) (Completed)   CBC with Differential/Platelet   Comprehensive metabolic panel   Lipid panel   Vitamin B12   Other Visit Diagnoses     Colon cancer screening       Relevant Orders   Cologuard       Meds ordered this encounter  Medications   solifenacin (VESICARE) 5 MG tablet    Sig: Take 1 tablet (5 mg total) by mouth daily.    Dispense:  90 tablet    Refill:  1    I, Ann Held, DO, personally preformed the services described in this  documentation.  All medical record entries made by the scribe were at my direction and in my presence.  I have reviewed the chart and discharge instructions (if applicable) and agree that the record reflects my personal performance and is accurate and complete. 02/21/2022   I,Amber Collins,acting as a scribe for Ann Held, DO.,have documented all relevant documentation on the behalf of Ann Held, DO,as directed by  Ann Held, DO while in the presence of Ann Held, DO.    Ann Held, DO

## 2022-02-21 NOTE — Assessment & Plan Note (Signed)
Referral to urology ua done--  May have uti-- pt has no symptoms  Culture pending

## 2022-02-21 NOTE — Assessment & Plan Note (Signed)
bmd done with gyn Pt should get 1200 mg calcium either in diet or supplement as well as vita d3 1000 u daily  Weight bearing exercise will help also but pt is too weak and unsteady on her feet PT/ OT referral placed

## 2022-02-21 NOTE — Telephone Encounter (Signed)
Prior auth started via cover my meds.  Awaiting determination.  Key: [id number]

## 2022-02-21 NOTE — Telephone Encounter (Signed)
[facility] is not able to process this request through ePA, please contact the plan at [phone] or fax in request to [phone].

## 2022-02-21 NOTE — Assessment & Plan Note (Signed)
HH referral placed for OT and PT

## 2022-02-21 NOTE — Assessment & Plan Note (Signed)
Per neuro 

## 2022-02-21 NOTE — Patient Instructions (Signed)
Preventive Care 65 Years and Older, Female Preventive care refers to lifestyle choices and visits with your health care provider that can promote health and wellness. Preventive care visits are also called wellness exams. What can I expect for my preventive care visit? Counseling Your health care provider may ask you questions about your: Medical history, including: Past medical problems. Family medical history. Pregnancy and menstrual history. History of falls. Current health, including: Memory and ability to understand (cognition). Emotional well-being. Home life and relationship well-being. Sexual activity and sexual health. Lifestyle, including: Alcohol, nicotine or tobacco, and drug use. Access to firearms. Diet, exercise, and sleep habits. Work and work environment. Sunscreen use. Safety issues such as seatbelt and bike helmet use. Physical exam Your health care provider will check your: Height and weight. These may be used to calculate your BMI (body mass index). BMI is a measurement that tells if you are at a healthy weight. Waist circumference. This measures the distance around your waistline. This measurement also tells if you are at a healthy weight and may help predict your risk of certain diseases, such as type 2 diabetes and high blood pressure. Heart rate and blood pressure. Body temperature. Skin for abnormal spots. What immunizations do I need?  Vaccines are usually given at various ages, according to a schedule. Your health care provider will recommend vaccines for you based on your age, medical history, and lifestyle or other factors, such as travel or where you work. What tests do I need? Screening Your health care provider may recommend screening tests for certain conditions. This may include: Lipid and cholesterol levels. Hepatitis C test. Hepatitis B test. HIV (human immunodeficiency virus) test. STI (sexually transmitted infection) testing, if you are at  risk. Lung cancer screening. Colorectal cancer screening. Diabetes screening. This is done by checking your blood sugar (glucose) after you have not eaten for a while (fasting). Mammogram. Talk with your health care provider about how often you should have regular mammograms. BRCA-related cancer screening. This may be done if you have a family history of breast, ovarian, tubal, or peritoneal cancers. Bone density scan. This is done to screen for osteoporosis. Talk with your health care provider about your test results, treatment options, and if necessary, the need for more tests. Follow these instructions at home: Eating and drinking  Eat a diet that includes fresh fruits and vegetables, whole grains, lean protein, and low-fat dairy products. Limit your intake of foods with high amounts of sugar, saturated fats, and salt. Take vitamin and mineral supplements as recommended by your health care provider. Do not drink alcohol if your health care provider tells you not to drink. If you drink alcohol: Limit how much you have to 0-1 drink a day. Know how much alcohol is in your drink. In the [place] one drink equals one 12 oz bottle of beer (355 mL), one 5 oz glass of wine (148 mL), or one 1 oz glass of hard liquor (44 mL). Lifestyle Brush your teeth every morning and night with fluoride toothpaste. Floss one time each day. Exercise for at least 30 minutes 5 or more days each week. Do not use any products that contain nicotine or tobacco. These products include cigarettes, chewing tobacco, and vaping devices, such as e-cigarettes. If you need help quitting, ask your health care provider. Do not use drugs. If you are sexually active, practice safe sex. Use a condom or other form of protection in order to prevent STIs. Take aspirin only as told by   your health care provider. Make sure that you understand how much to take and what form to take. Work with your health care provider to find out whether it  is safe and beneficial for you to take aspirin daily. Ask your health care provider if you need to take a cholesterol-lowering medicine (statin). Find healthy ways to manage stress, such as: Meditation, yoga, or listening to music. Journaling. Talking to a trusted person. Spending time with friends and family. Minimize exposure to UV radiation to reduce your risk of skin cancer. Safety Always wear your seat belt while driving or riding in a vehicle. Do not drive: If you have been drinking alcohol. Do not ride with someone who has been drinking. When you are tired or distracted. While texting. If you have been using any mind-altering substances or drugs. Wear a helmet and other protective equipment during sports activities. If you have firearms in your house, make sure you follow all gun safety procedures. What's next? Visit your health care provider once a year for an annual wellness visit. Ask your health care provider how often you should have your eyes and teeth checked. Stay up to date on all vaccines. This information is not intended to replace advice given to you by your health care provider. Make sure you discuss any questions you have with your health care provider. Document Revised: 03/03/2021 Document Reviewed: 03/03/2021 Elsevier Patient Education  2023 Elsevier Inc.  

## 2022-02-22 MED ORDER — GEMTESA 75 MG PO TABS: 1.0000 | ORAL_TABLET | Freq: Every day | ORAL | 2 refills | Status: DC

## 2022-02-22 NOTE — Telephone Encounter (Signed)
Spoke with Dorian at [facility] and she stated that patient must try step therapy first.  Must try either oxybutynin or Gemtesa.

## 2022-02-22 NOTE — Telephone Encounter (Signed)
Rx for [facility] sent in.

## 2022-02-22 NOTE — Addendum Note (Signed)
Addended by: Kem Boroughs D on: 02/22/2022 01:38 PM   Modules accepted: Orders

## 2022-02-23 ENCOUNTER — Other Ambulatory Visit: Payer: Self-pay

## 2022-02-23 DIAGNOSIS — Z87440 Personal history of urinary (tract) infections: Secondary | ICD-10-CM | POA: Diagnosis not present

## 2022-02-23 DIAGNOSIS — R296 Repeated falls: Secondary | ICD-10-CM | POA: Diagnosis not present

## 2022-02-23 DIAGNOSIS — R350 Frequency of micturition: Secondary | ICD-10-CM | POA: Diagnosis not present

## 2022-02-23 DIAGNOSIS — Z7982 Long term (current) use of aspirin: Secondary | ICD-10-CM | POA: Diagnosis not present

## 2022-02-23 DIAGNOSIS — G20 Parkinson's disease: Secondary | ICD-10-CM | POA: Diagnosis not present

## 2022-02-23 DIAGNOSIS — M8580 Other specified disorders of bone density and structure, unspecified site: Secondary | ICD-10-CM | POA: Diagnosis not present

## 2022-02-23 DIAGNOSIS — F411 Generalized anxiety disorder: Secondary | ICD-10-CM | POA: Diagnosis not present

## 2022-02-23 DIAGNOSIS — R413 Other amnesia: Secondary | ICD-10-CM | POA: Diagnosis not present

## 2022-02-23 DIAGNOSIS — R32 Unspecified urinary incontinence: Secondary | ICD-10-CM | POA: Diagnosis not present

## 2022-02-23 DIAGNOSIS — Z853 Personal history of malignant neoplasm of breast: Secondary | ICD-10-CM | POA: Diagnosis not present

## 2022-02-23 DIAGNOSIS — Z9181 History of falling: Secondary | ICD-10-CM | POA: Diagnosis not present

## 2022-02-23 LAB — URINE CULTURE
MICRO NUMBER:: 13483543
SPECIMEN QUALITY:: ADEQUATE

## 2022-02-23 MED ORDER — CIPROFLOXACIN HCL 500 MG PO TABS: 500.0000 mg | ORAL_TABLET | Freq: Two times a day (BID) | ORAL | 0 refills | Status: DC

## 2022-02-24 ENCOUNTER — Telehealth: Payer: Self-pay | Admitting: Family Medicine

## 2022-02-24 ENCOUNTER — Other Ambulatory Visit (INDEPENDENT_AMBULATORY_CARE_PROVIDER_SITE_OTHER): Payer: Medicare Other

## 2022-02-24 DIAGNOSIS — R296 Repeated falls: Secondary | ICD-10-CM | POA: Diagnosis not present

## 2022-02-24 DIAGNOSIS — G20 Parkinson's disease: Secondary | ICD-10-CM

## 2022-02-24 DIAGNOSIS — R32 Unspecified urinary incontinence: Secondary | ICD-10-CM

## 2022-02-24 DIAGNOSIS — Z136 Encounter for screening for cardiovascular disorders: Secondary | ICD-10-CM | POA: Diagnosis not present

## 2022-02-24 DIAGNOSIS — F411 Generalized anxiety disorder: Secondary | ICD-10-CM | POA: Diagnosis not present

## 2022-02-24 DIAGNOSIS — R350 Frequency of micturition: Secondary | ICD-10-CM | POA: Diagnosis not present

## 2022-02-24 DIAGNOSIS — M8580 Other specified disorders of bone density and structure, unspecified site: Secondary | ICD-10-CM | POA: Diagnosis not present

## 2022-02-24 LAB — COMPREHENSIVE METABOLIC PANEL WITH GFR
ALT: 4 U/L (ref 0–35)
AST: 17 U/L (ref 0–37)
Albumin: 3.9 g/dL (ref 3.5–5.2)
Alkaline Phosphatase: 70 U/L (ref 39–117)
BUN: 22 mg/dL (ref 6–23)
CO2: 25 meq/L (ref 19–32)
Calcium: 9.1 mg/dL (ref 8.4–10.5)
Chloride: 107 meq/L (ref 96–112)
Creatinine, Ser: 0.88 mg/dL (ref 0.40–1.20)
GFR: 64.58 mL/min (ref >60.00)
Glucose, Bld: 86 mg/dL (ref 70–99)
Potassium: 4.1 meq/L (ref 3.5–5.1)
Sodium: 138 meq/L (ref 135–145)
Total Bilirubin: 0.5 mg/dL (ref 0.2–1.2)
Total Protein: 6.2 g/dL (ref 6.0–8.3)

## 2022-02-24 LAB — VITAMIN B12: Vitamin B-12: 553 pg/mL (ref 211–911)

## 2022-02-24 LAB — CBC WITH DIFFERENTIAL/PLATELET
Basophils Absolute: 0 K/uL (ref 0.0–0.1)
Basophils Relative: 0.6 % (ref 0.0–3.0)
Eosinophils Absolute: 0.1 K/uL (ref 0.0–0.7)
Eosinophils Relative: 1.8 % (ref 0.0–5.0)
HCT: 38 % (ref 36.0–46.0)
Hemoglobin: 12.4 g/dL (ref 12.0–15.0)
Lymphocytes Relative: 19.4 % (ref 12.0–46.0)
Lymphs Abs: 1.1 K/uL (ref 0.7–4.0)
MCHC: 32.7 g/dL (ref 30.0–36.0)
MCV: 94.5 fl (ref 78.0–100.0)
Monocytes Absolute: 0.4 K/uL (ref 0.1–1.0)
Monocytes Relative: 7.6 % (ref 3.0–12.0)
Neutro Abs: 4 K/uL (ref 1.4–7.7)
Neutrophils Relative %: 70.6 % (ref 43.0–77.0)
Platelets: 207 K/uL (ref 150.0–400.0)
RBC: 4.02 Mil/uL (ref 3.87–5.11)
RDW: 13.7 % (ref 11.5–15.5)
WBC: 5.6 K/uL (ref 4.0–10.5)

## 2022-02-24 LAB — LIPID PANEL
Cholesterol: 183 mg/dL (ref 0–200)
HDL: 61 mg/dL (ref >39.00)
LDL Cholesterol: 105 mg/dL — ABNORMAL HIGH (ref 0–99)
NonHDL: 122.28
Total CHOL/HDL Ratio: 3
Triglycerides: 87 mg/dL (ref 0.0–149.0)
VLDL: 17.4 mg/dL (ref 0.0–40.0)

## 2022-02-24 NOTE — Telephone Encounter (Signed)
Verbal given 

## 2022-02-24 NOTE — Telephone Encounter (Signed)
Caller/Agency: [facility] Callback Number: [phone] Requesting OT/PT/Skilled Nursing/Social Work/Speech Therapy: PT Frequency: 2x for 3w 1x for 1w 2x for 3w

## 2022-02-25 NOTE — Addendum Note (Signed)
Addended byDamita Dunnings D on: 02/25/2022 09:41 AM   Modules accepted: Orders

## 2022-02-27 DIAGNOSIS — Z9181 History of falling: Secondary | ICD-10-CM | POA: Diagnosis not present

## 2022-02-27 DIAGNOSIS — G20 Parkinson's disease: Secondary | ICD-10-CM | POA: Diagnosis not present

## 2022-02-27 DIAGNOSIS — M8580 Other specified disorders of bone density and structure, unspecified site: Secondary | ICD-10-CM | POA: Diagnosis not present

## 2022-02-27 DIAGNOSIS — Z7982 Long term (current) use of aspirin: Secondary | ICD-10-CM | POA: Diagnosis not present

## 2022-02-27 DIAGNOSIS — Z90710 Acquired absence of both cervix and uterus: Secondary | ICD-10-CM | POA: Diagnosis not present

## 2022-02-27 DIAGNOSIS — Z853 Personal history of malignant neoplasm of breast: Secondary | ICD-10-CM | POA: Diagnosis not present

## 2022-02-27 DIAGNOSIS — R32 Unspecified urinary incontinence: Secondary | ICD-10-CM | POA: Diagnosis not present

## 2022-02-27 DIAGNOSIS — Z87440 Personal history of urinary (tract) infections: Secondary | ICD-10-CM | POA: Diagnosis not present

## 2022-02-27 DIAGNOSIS — F411 Generalized anxiety disorder: Secondary | ICD-10-CM | POA: Diagnosis not present

## 2022-03-02 DIAGNOSIS — Z87440 Personal history of urinary (tract) infections: Secondary | ICD-10-CM | POA: Diagnosis not present

## 2022-03-02 DIAGNOSIS — G20 Parkinson's disease: Secondary | ICD-10-CM | POA: Diagnosis not present

## 2022-03-02 DIAGNOSIS — Z7982 Long term (current) use of aspirin: Secondary | ICD-10-CM | POA: Diagnosis not present

## 2022-03-02 DIAGNOSIS — R32 Unspecified urinary incontinence: Secondary | ICD-10-CM | POA: Diagnosis not present

## 2022-03-02 DIAGNOSIS — F411 Generalized anxiety disorder: Secondary | ICD-10-CM | POA: Diagnosis not present

## 2022-03-02 DIAGNOSIS — M8580 Other specified disorders of bone density and structure, unspecified site: Secondary | ICD-10-CM | POA: Diagnosis not present

## 2022-03-03 DIAGNOSIS — M8580 Other specified disorders of bone density and structure, unspecified site: Secondary | ICD-10-CM | POA: Diagnosis not present

## 2022-03-03 DIAGNOSIS — R32 Unspecified urinary incontinence: Secondary | ICD-10-CM | POA: Diagnosis not present

## 2022-03-03 DIAGNOSIS — G20 Parkinson's disease: Secondary | ICD-10-CM | POA: Diagnosis not present

## 2022-03-03 DIAGNOSIS — F411 Generalized anxiety disorder: Secondary | ICD-10-CM | POA: Diagnosis not present

## 2022-03-03 DIAGNOSIS — Z7982 Long term (current) use of aspirin: Secondary | ICD-10-CM | POA: Diagnosis not present

## 2022-03-03 DIAGNOSIS — Z87440 Personal history of urinary (tract) infections: Secondary | ICD-10-CM | POA: Diagnosis not present

## 2022-03-04 DIAGNOSIS — F411 Generalized anxiety disorder: Secondary | ICD-10-CM | POA: Diagnosis not present

## 2022-03-04 DIAGNOSIS — R296 Repeated falls: Secondary | ICD-10-CM | POA: Diagnosis not present

## 2022-03-04 DIAGNOSIS — M8580 Other specified disorders of bone density and structure, unspecified site: Secondary | ICD-10-CM | POA: Diagnosis not present

## 2022-03-04 DIAGNOSIS — Z9181 History of falling: Secondary | ICD-10-CM | POA: Diagnosis not present

## 2022-03-04 DIAGNOSIS — G20 Parkinson's disease: Secondary | ICD-10-CM | POA: Diagnosis not present

## 2022-03-04 DIAGNOSIS — Z87440 Personal history of urinary (tract) infections: Secondary | ICD-10-CM | POA: Diagnosis not present

## 2022-03-04 DIAGNOSIS — R32 Unspecified urinary incontinence: Secondary | ICD-10-CM | POA: Diagnosis not present

## 2022-03-04 DIAGNOSIS — Z853 Personal history of malignant neoplasm of breast: Secondary | ICD-10-CM | POA: Diagnosis not present

## 2022-03-04 DIAGNOSIS — Z7982 Long term (current) use of aspirin: Secondary | ICD-10-CM | POA: Diagnosis not present

## 2022-03-04 DIAGNOSIS — R413 Other amnesia: Secondary | ICD-10-CM | POA: Diagnosis not present

## 2022-03-04 DIAGNOSIS — R350 Frequency of micturition: Secondary | ICD-10-CM | POA: Diagnosis not present

## 2022-03-08 DIAGNOSIS — Z87440 Personal history of urinary (tract) infections: Secondary | ICD-10-CM | POA: Diagnosis not present

## 2022-03-08 DIAGNOSIS — Z7982 Long term (current) use of aspirin: Secondary | ICD-10-CM | POA: Diagnosis not present

## 2022-03-08 DIAGNOSIS — F411 Generalized anxiety disorder: Secondary | ICD-10-CM | POA: Diagnosis not present

## 2022-03-08 DIAGNOSIS — G20 Parkinson's disease: Secondary | ICD-10-CM | POA: Diagnosis not present

## 2022-03-08 DIAGNOSIS — R32 Unspecified urinary incontinence: Secondary | ICD-10-CM | POA: Diagnosis not present

## 2022-03-08 DIAGNOSIS — M8580 Other specified disorders of bone density and structure, unspecified site: Secondary | ICD-10-CM | POA: Diagnosis not present

## 2022-03-10 DIAGNOSIS — M8580 Other specified disorders of bone density and structure, unspecified site: Secondary | ICD-10-CM | POA: Diagnosis not present

## 2022-03-10 DIAGNOSIS — F411 Generalized anxiety disorder: Secondary | ICD-10-CM | POA: Diagnosis not present

## 2022-03-10 DIAGNOSIS — G20 Parkinson's disease: Secondary | ICD-10-CM | POA: Diagnosis not present

## 2022-03-10 DIAGNOSIS — Z7982 Long term (current) use of aspirin: Secondary | ICD-10-CM | POA: Diagnosis not present

## 2022-03-10 DIAGNOSIS — R32 Unspecified urinary incontinence: Secondary | ICD-10-CM | POA: Diagnosis not present

## 2022-03-10 DIAGNOSIS — Z87440 Personal history of urinary (tract) infections: Secondary | ICD-10-CM | POA: Diagnosis not present

## 2022-03-15 DIAGNOSIS — R269 Unspecified abnormalities of gait and mobility: Secondary | ICD-10-CM | POA: Diagnosis not present

## 2022-03-15 DIAGNOSIS — R441 Visual hallucinations: Secondary | ICD-10-CM | POA: Diagnosis not present

## 2022-03-15 DIAGNOSIS — G20 Parkinson's disease: Secondary | ICD-10-CM | POA: Diagnosis not present

## 2022-03-16 DIAGNOSIS — Z7982 Long term (current) use of aspirin: Secondary | ICD-10-CM | POA: Diagnosis not present

## 2022-03-16 DIAGNOSIS — F411 Generalized anxiety disorder: Secondary | ICD-10-CM | POA: Diagnosis not present

## 2022-03-16 DIAGNOSIS — M8580 Other specified disorders of bone density and structure, unspecified site: Secondary | ICD-10-CM | POA: Diagnosis not present

## 2022-03-16 DIAGNOSIS — G20 Parkinson's disease: Secondary | ICD-10-CM | POA: Diagnosis not present

## 2022-03-16 DIAGNOSIS — Z87440 Personal history of urinary (tract) infections: Secondary | ICD-10-CM | POA: Diagnosis not present

## 2022-03-16 DIAGNOSIS — R32 Unspecified urinary incontinence: Secondary | ICD-10-CM | POA: Diagnosis not present

## 2022-03-21 DIAGNOSIS — G20 Parkinson's disease: Secondary | ICD-10-CM | POA: Diagnosis not present

## 2022-03-21 DIAGNOSIS — Z87440 Personal history of urinary (tract) infections: Secondary | ICD-10-CM | POA: Diagnosis not present

## 2022-03-21 DIAGNOSIS — M8580 Other specified disorders of bone density and structure, unspecified site: Secondary | ICD-10-CM | POA: Diagnosis not present

## 2022-03-21 DIAGNOSIS — R32 Unspecified urinary incontinence: Secondary | ICD-10-CM | POA: Diagnosis not present

## 2022-03-21 DIAGNOSIS — F411 Generalized anxiety disorder: Secondary | ICD-10-CM | POA: Diagnosis not present

## 2022-03-21 DIAGNOSIS — Z7982 Long term (current) use of aspirin: Secondary | ICD-10-CM | POA: Diagnosis not present

## 2022-03-25 DIAGNOSIS — Z7982 Long term (current) use of aspirin: Secondary | ICD-10-CM | POA: Diagnosis not present

## 2022-03-25 DIAGNOSIS — M8580 Other specified disorders of bone density and structure, unspecified site: Secondary | ICD-10-CM | POA: Diagnosis not present

## 2022-03-25 DIAGNOSIS — Z87440 Personal history of urinary (tract) infections: Secondary | ICD-10-CM | POA: Diagnosis not present

## 2022-03-25 DIAGNOSIS — F411 Generalized anxiety disorder: Secondary | ICD-10-CM | POA: Diagnosis not present

## 2022-03-25 DIAGNOSIS — R32 Unspecified urinary incontinence: Secondary | ICD-10-CM | POA: Diagnosis not present

## 2022-03-25 DIAGNOSIS — G20 Parkinson's disease: Secondary | ICD-10-CM | POA: Diagnosis not present

## 2022-03-29 DIAGNOSIS — Z7982 Long term (current) use of aspirin: Secondary | ICD-10-CM | POA: Diagnosis not present

## 2022-03-29 DIAGNOSIS — F411 Generalized anxiety disorder: Secondary | ICD-10-CM | POA: Diagnosis not present

## 2022-03-29 DIAGNOSIS — R32 Unspecified urinary incontinence: Secondary | ICD-10-CM | POA: Diagnosis not present

## 2022-03-29 DIAGNOSIS — Z87440 Personal history of urinary (tract) infections: Secondary | ICD-10-CM | POA: Diagnosis not present

## 2022-03-29 DIAGNOSIS — G20 Parkinson's disease: Secondary | ICD-10-CM | POA: Diagnosis not present

## 2022-03-29 DIAGNOSIS — Z90710 Acquired absence of both cervix and uterus: Secondary | ICD-10-CM | POA: Diagnosis not present

## 2022-03-29 DIAGNOSIS — Z853 Personal history of malignant neoplasm of breast: Secondary | ICD-10-CM | POA: Diagnosis not present

## 2022-03-29 DIAGNOSIS — Z9181 History of falling: Secondary | ICD-10-CM | POA: Diagnosis not present

## 2022-03-29 DIAGNOSIS — M8580 Other specified disorders of bone density and structure, unspecified site: Secondary | ICD-10-CM | POA: Diagnosis not present

## 2022-03-30 DIAGNOSIS — Z87440 Personal history of urinary (tract) infections: Secondary | ICD-10-CM | POA: Diagnosis not present

## 2022-03-30 DIAGNOSIS — G20 Parkinson's disease: Secondary | ICD-10-CM | POA: Diagnosis not present

## 2022-03-30 DIAGNOSIS — F411 Generalized anxiety disorder: Secondary | ICD-10-CM | POA: Diagnosis not present

## 2022-03-30 DIAGNOSIS — R32 Unspecified urinary incontinence: Secondary | ICD-10-CM | POA: Diagnosis not present

## 2022-03-30 DIAGNOSIS — M8580 Other specified disorders of bone density and structure, unspecified site: Secondary | ICD-10-CM | POA: Diagnosis not present

## 2022-03-30 DIAGNOSIS — Z7982 Long term (current) use of aspirin: Secondary | ICD-10-CM | POA: Diagnosis not present

## 2022-04-01 DIAGNOSIS — H524 Presbyopia: Secondary | ICD-10-CM | POA: Diagnosis not present

## 2022-04-01 DIAGNOSIS — H2513 Age-related nuclear cataract, bilateral: Secondary | ICD-10-CM | POA: Diagnosis not present

## 2022-04-01 DIAGNOSIS — H35371 Puckering of macula, right eye: Secondary | ICD-10-CM | POA: Diagnosis not present

## 2022-04-04 DIAGNOSIS — F411 Generalized anxiety disorder: Secondary | ICD-10-CM | POA: Diagnosis not present

## 2022-04-04 DIAGNOSIS — M8580 Other specified disorders of bone density and structure, unspecified site: Secondary | ICD-10-CM | POA: Diagnosis not present

## 2022-04-04 DIAGNOSIS — Z7982 Long term (current) use of aspirin: Secondary | ICD-10-CM | POA: Diagnosis not present

## 2022-04-04 DIAGNOSIS — R32 Unspecified urinary incontinence: Secondary | ICD-10-CM | POA: Diagnosis not present

## 2022-04-04 DIAGNOSIS — G20 Parkinson's disease: Secondary | ICD-10-CM | POA: Diagnosis not present

## 2022-04-04 DIAGNOSIS — Z87440 Personal history of urinary (tract) infections: Secondary | ICD-10-CM | POA: Diagnosis not present

## 2022-04-05 ENCOUNTER — Other Ambulatory Visit (INDEPENDENT_AMBULATORY_CARE_PROVIDER_SITE_OTHER): Payer: Medicare Other

## 2022-04-05 DIAGNOSIS — N390 Urinary tract infection, site not specified: Secondary | ICD-10-CM

## 2022-04-05 LAB — URINALYSIS, ROUTINE W REFLEX MICROSCOPIC
Bilirubin Urine: NEGATIVE
Hgb urine dipstick: NEGATIVE
Ketones, ur: NEGATIVE
Nitrite: NEGATIVE
RBC / HPF: NONE SEEN (ref 0–?)
Specific Gravity, Urine: 1.025 (ref 1.000–1.030)
Total Protein, Urine: NEGATIVE
Urine Glucose: NEGATIVE
Urobilinogen, UA: 0.2 (ref 0.0–1.0)
pH: 5.5 (ref 5.0–8.0)

## 2022-04-06 LAB — URINE CULTURE
MICRO NUMBER:: 13661400
SPECIMEN QUALITY:: ADEQUATE

## 2022-04-07 DIAGNOSIS — M8580 Other specified disorders of bone density and structure, unspecified site: Secondary | ICD-10-CM | POA: Diagnosis not present

## 2022-04-07 DIAGNOSIS — R32 Unspecified urinary incontinence: Secondary | ICD-10-CM | POA: Diagnosis not present

## 2022-04-07 DIAGNOSIS — G20 Parkinson's disease: Secondary | ICD-10-CM | POA: Diagnosis not present

## 2022-04-07 DIAGNOSIS — Z7982 Long term (current) use of aspirin: Secondary | ICD-10-CM | POA: Diagnosis not present

## 2022-04-07 DIAGNOSIS — F411 Generalized anxiety disorder: Secondary | ICD-10-CM | POA: Diagnosis not present

## 2022-04-07 DIAGNOSIS — Z87440 Personal history of urinary (tract) infections: Secondary | ICD-10-CM | POA: Diagnosis not present

## 2022-04-12 DIAGNOSIS — F411 Generalized anxiety disorder: Secondary | ICD-10-CM | POA: Diagnosis not present

## 2022-04-12 DIAGNOSIS — G20 Parkinson's disease: Secondary | ICD-10-CM | POA: Diagnosis not present

## 2022-04-12 DIAGNOSIS — M8580 Other specified disorders of bone density and structure, unspecified site: Secondary | ICD-10-CM | POA: Diagnosis not present

## 2022-04-12 DIAGNOSIS — R32 Unspecified urinary incontinence: Secondary | ICD-10-CM | POA: Diagnosis not present

## 2022-04-12 DIAGNOSIS — Z87440 Personal history of urinary (tract) infections: Secondary | ICD-10-CM | POA: Diagnosis not present

## 2022-04-12 DIAGNOSIS — Z7982 Long term (current) use of aspirin: Secondary | ICD-10-CM | POA: Diagnosis not present

## 2022-04-19 DIAGNOSIS — M8580 Other specified disorders of bone density and structure, unspecified site: Secondary | ICD-10-CM | POA: Diagnosis not present

## 2022-04-19 DIAGNOSIS — F411 Generalized anxiety disorder: Secondary | ICD-10-CM | POA: Diagnosis not present

## 2022-04-19 DIAGNOSIS — Z87440 Personal history of urinary (tract) infections: Secondary | ICD-10-CM | POA: Diagnosis not present

## 2022-04-19 DIAGNOSIS — R32 Unspecified urinary incontinence: Secondary | ICD-10-CM | POA: Diagnosis not present

## 2022-04-19 DIAGNOSIS — G20 Parkinson's disease: Secondary | ICD-10-CM | POA: Diagnosis not present

## 2022-04-19 DIAGNOSIS — Z7982 Long term (current) use of aspirin: Secondary | ICD-10-CM | POA: Diagnosis not present

## 2022-04-21 ENCOUNTER — Other Ambulatory Visit: Payer: Self-pay | Admitting: Family Medicine

## 2022-04-22 DIAGNOSIS — Z7982 Long term (current) use of aspirin: Secondary | ICD-10-CM | POA: Diagnosis not present

## 2022-04-22 DIAGNOSIS — R32 Unspecified urinary incontinence: Secondary | ICD-10-CM | POA: Diagnosis not present

## 2022-04-22 DIAGNOSIS — M8580 Other specified disorders of bone density and structure, unspecified site: Secondary | ICD-10-CM | POA: Diagnosis not present

## 2022-04-22 DIAGNOSIS — F411 Generalized anxiety disorder: Secondary | ICD-10-CM | POA: Diagnosis not present

## 2022-04-22 DIAGNOSIS — Z87440 Personal history of urinary (tract) infections: Secondary | ICD-10-CM | POA: Diagnosis not present

## 2022-04-22 DIAGNOSIS — G20 Parkinson's disease: Secondary | ICD-10-CM | POA: Diagnosis not present

## 2022-04-27 ENCOUNTER — Encounter: Payer: Self-pay | Admitting: Family Medicine

## 2022-04-27 DIAGNOSIS — Z01419 Encounter for gynecological examination (general) (routine) without abnormal findings: Secondary | ICD-10-CM | POA: Diagnosis not present

## 2022-04-27 DIAGNOSIS — G20 Parkinson's disease: Secondary | ICD-10-CM

## 2022-05-12 ENCOUNTER — Encounter: Payer: Self-pay | Admitting: Family Medicine

## 2022-05-12 ENCOUNTER — Ambulatory Visit (INDEPENDENT_AMBULATORY_CARE_PROVIDER_SITE_OTHER): Payer: Medicare Other | Admitting: Family Medicine

## 2022-05-12 VITALS — BP 98/60 | HR 75 | Temp 97.7°F | Resp 18 | Ht 64.0 in | Wt 126.4 lb

## 2022-05-12 DIAGNOSIS — F419 Anxiety disorder, unspecified: Secondary | ICD-10-CM | POA: Diagnosis not present

## 2022-05-12 DIAGNOSIS — G20 Parkinson's disease: Secondary | ICD-10-CM | POA: Diagnosis not present

## 2022-05-12 MED ORDER — SERTRALINE HCL 50 MG PO TABS: 50.0000 mg | ORAL_TABLET | Freq: Every day | ORAL | 3 refills | Status: AC

## 2022-05-12 NOTE — Progress Notes (Signed)
Established Patient Office Visit  Subjective   Patient ID: Amanda [name], female    DOB: 1946/04/08  Age: 76 y.o. MRN: [id number]  No chief complaint on file.   HPI Pt is here with care taker---  they are requesting PT/ OT , social work --and pt needs more help at home.  She is falling a lot more.    Patient Active Problem List   Diagnosis Date Noted   Screening, ischemic heart disease 02/21/2022   Urinary incontinence 02/21/2022   Frequent falls 02/21/2022   Acute right ankle pain 06/17/2021   Drug-induced polyneuropathy ([place]) 12/14/2020   Diarrhea 12/14/2020   Incontinence in female 12/14/2020   Dyspepsia 12/14/2020   Bunion 08/19/2019   Hammer toe 08/19/2019   Breast cancer, stage 2, left ([place]) 11/08/2018   Flexural eczema 05/13/2018   Malignant neoplasm of lower-outer quadrant of left breast of female, estrogen receptor positive ([place]) 08/14/2017   Edema, lower extremity 05/02/2017   Insect sting 05/09/2016   Prolapse of female pelvic organs 01/11/2016   Biceps tendinitis on left 02/18/2014   Parkinson disease ([place]) 10/03/2012   Sinusitis 11/09/2011   Anxiety 06/03/2009   OVARIAN CYST 06/02/2008   Osteopenia 06/02/2008   FATIGUE 06/02/2008   Past Medical History:  Diagnosis Date   Breast cancer, left breast ([place])    Frequency of urination    GAD (generalized anxiety disorder)    Osteopenia    Parkinson disease ([place])    neurologist-  dr Arvella Nigh--  right side predominant   Parkinson disease ([place])    Prolapse of vaginal wall    Urgency of urination    Wears glasses    Past Surgical History:  Procedure Laterality Date   ABDOMINAL HYSTERECTOMY     BREAST BIOPSY Left 07/2017   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   COLONOSCOPY  last one 2007   CYSTOCELE REPAIR N/A 01/11/2016   Procedure: ANTERIOR REPAIR (CYSTOCELE) COLOPLAST FASCIA  SACROSPINOUS FIXATION;  Surgeon: Carolan Clines, MD;  Location: [facility];  Service: Urology;   Laterality: N/A;   CYSTOSCOPY W/ URETERAL STENT PLACEMENT Bilateral 01/11/2016   Procedure: CYSTOSCOPY WITH BILATERAL RETROGRADE PYELOGRAM/URETERAL STENT PLACEMENT;  Surgeon: Carolan Clines, MD;  Location: [facility];  Service: Urology;  Laterality: Bilateral;   ESOPHAGOGASTRODUODENOSCOPY  2007   EXTERNAL EAR SURGERY Bilateral age 48   LAPAROSCOPIC OVARIAN CYSTECTOMY  March 2008   unilateral   MASTECTOMY Left 2018   PUBOVAGINAL SLING N/A 01/11/2016   Procedure:  ALTIS SLING SINGLE INCISION;  Surgeon: Carolan Clines, MD;  Location: [facility];  Service: Urology;  Laterality: N/A;   RECTOCELE REPAIR N/A 01/11/2016   Procedure: POSTERIOR REPAIR (RECTOCELE), PERINEAPLASTY;  Surgeon: Dian Queen, MD;  Location: [facility];  Service: Gynecology;  Laterality: N/A;   SIMPLE MASTECTOMY WITH AXILLARY SENTINEL NODE BIOPSY Left 11/08/2018   Procedure: LEFT SIMPLE MASTECTOMY WITH LEFT  AXILLARY SENTINEL NODE MAPPING;  Surgeon: Erroll Luna, MD;  Location: [facility];  Service: General;  Laterality: Left;   VAGINAL HYSTERECTOMY N/A 01/11/2016   Procedure: TOTAL VAGINAL HYSTERECTOMY ;  Surgeon: Dian Queen, MD;  Location: [place];  Service: Gynecology;  Laterality: N/A;   Social History   Tobacco Use   Smoking status: Never   Smokeless tobacco: Never  Vaping Use   Vaping Use: Never used  Substance Use Topics   Alcohol use: No   Drug use: No   Social History  Socioeconomic History   Marital status: Married    Spouse name: Chrissie Noa   Number of children: 3   Years of education: College   Highest education level: Not on file  Occupational History   Occupation: retired  Tobacco Use   Smoking status: Never   Smokeless tobacco: Never  Vaping Use   Vaping Use: Never used  Substance and Sexual Activity   Alcohol use: No   Drug use: No   Sexual activity: Not on file  Other Topics Concern   Not on file  Social History  Narrative   Pt lives at home with family.Caffeine Use: some, occasionally   Has Parkinson's   Social Determinants of Health   Financial Resource Strain: Low Risk  (06/05/2021)   Overall Financial Resource Strain (CARDIA)    Difficulty of Paying Living Expenses: Not hard at all  Food Insecurity: No Food Insecurity (06/05/2021)   Hunger Vital Sign    Worried About Running Out of Food in the Last Year: Never true    Ran Out of Food in the Last Year: Never true  Transportation Needs: No Transportation Needs (06/05/2021)   PRAPARE - Hydrologist (Medical): No    Lack of Transportation (Non-Medical): No  Physical Activity: Inactive (06/05/2021)   Exercise Vital Sign    Days of Exercise per Week: 0 days    Minutes of Exercise per Session: 0 min  Stress: Stress Concern Present (06/05/2021)   [place]    Feeling of Stress : To some extent  Social Connections: Moderately Integrated (06/05/2021)   Social Connection and Isolation Panel [NHANES]    Frequency of Communication with Friends and Family: More than three times a week    Frequency of Social Gatherings with Friends and Family: Once a week    Attends Religious Services: 1 to 4 times per year    Active Member of [employer] or Organizations: No    Attends Archivist Meetings: Never    Marital Status: Married  Human resources officer Violence: Not At Risk (06/05/2021)   Humiliation, Afraid, Rape, and Kick questionnaire    Fear of Current or Ex-Partner: No    Emotionally Abused: No    Physically Abused: No    Sexually Abused: No   Family Status  Relation Name Status   Mother  Deceased   Father  Deceased   Sister  Alive   Brother  Deceased at age 3   Other  (Not Specified)       weight disorder   Son  Alive   Family History  Problem Relation Age of Onset   Heart disease Mother        enlarged heart   Cancer Father        pancreatic    COPD Sister    Pulmonary embolism Sister    Edema Sister        Ankles   Heart disease Brother    Polycystic kidney disease Son    Allergies  Allergen Reactions   Penicillins     Leg pain Did it involve swelling of the face/tongue/throat, SOB, or low BP? No Did it involve sudden or severe rash/hives, skin peeling, or any reaction on the inside of your mouth or nose? No Did you need to seek medical attention at a hospital or doctor's office? No When did it last happen?      childhood allergy If all above answers are "NO", may  proceed with cephalosporin use.        Review of Systems  Constitutional:  Negative for fever and malaise/fatigue.  HENT:  Negative for congestion.   Eyes:  Negative for blurred vision.  Respiratory:  Negative for shortness of breath.   Cardiovascular:  Negative for chest pain, palpitations and leg swelling.  Gastrointestinal:  Negative for abdominal pain, blood in stool and nausea.  Genitourinary:  Negative for dysuria and frequency.  Musculoskeletal:  Negative for falls.  Skin:  Negative for rash.  Neurological:  Positive for weakness. Negative for dizziness, loss of consciousness and headaches.  Endo/Heme/Allergies:  Negative for environmental allergies.  Psychiatric/Behavioral:  Negative for depression. The patient is not nervous/anxious.       Objective:     BP 98/60 (BP Location: Left Arm, Patient Position: Sitting, Cuff Size: Normal)   Pulse 75   Temp 97.7 F (36.5 C) (Oral)   Resp 18   Ht '5\' 4"'$  (1.626 m)   Wt 126 lb 6.4 oz (57.3 kg)   SpO2 94%   BMI 21.70 kg/m  BP Readings from Last 3 Encounters:  05/12/22 98/60  02/21/22 118/68  11/18/21 132/78   Wt Readings from Last 3 Encounters:  05/12/22 126 lb 6.4 oz (57.3 kg)  02/21/22 126 lb 6.4 oz (57.3 kg)  11/18/21 125 lb 14.4 oz (57.1 kg)   SpO2 Readings from Last 3 Encounters:  05/12/22 94%  02/21/22 98%  11/18/21 96%      Physical Exam Vitals and nursing note reviewed.   Constitutional:      Appearance: She is well-developed.  HENT:     Head: Normocephalic and atraumatic.  Eyes:     Conjunctiva/sclera: Conjunctivae normal.  Neck:     Thyroid: No thyromegaly.     Vascular: No carotid bruit or JVD.  Cardiovascular:     Rate and Rhythm: Normal rate and regular rhythm.     Heart sounds: Normal heart sounds. No murmur heard. Pulmonary:     Effort: Pulmonary effort is normal. No respiratory distress.     Breath sounds: Normal breath sounds. No wheezing or rales.  Chest:     Chest wall: No tenderness.  Musculoskeletal:     Cervical back: Normal range of motion and neck supple.  Neurological:     Mental Status: She is alert. Mental status is at baseline.     Motor: Weakness present.     Gait: Gait abnormal.      No results found for any visits on 05/12/22.    The 10-year ASCVD risk score (Arnett DK, et al., 2019) is: 8.7%    Assessment & Plan:   Problem List Items Addressed This Visit       Unprioritized   Anxiety   Relevant Medications   sertraline (ZOLOFT) 50 MG tablet   Other Relevant Orders   Ambulatory referral to [facility]) - Primary    Per neuro Home health order in for ot//pt /sw  Pt needs more help at home due to frequent falls       Relevant Orders   Ambulatory referral to [facility]    Return in about 3 months (around 08/12/2022), or if symptoms worsen or fail to improve.    Ann Held, DO

## 2022-05-12 NOTE — Patient Instructions (Signed)

## 2022-05-12 NOTE — Assessment & Plan Note (Signed)
Per neuro Home health order in for ot//pt /sw  Pt needs more help at home due to frequent falls

## 2022-05-13 DIAGNOSIS — Z556 Problems related to health literacy: Secondary | ICD-10-CM | POA: Diagnosis not present

## 2022-05-13 DIAGNOSIS — F411 Generalized anxiety disorder: Secondary | ICD-10-CM | POA: Diagnosis not present

## 2022-05-13 DIAGNOSIS — Z87440 Personal history of urinary (tract) infections: Secondary | ICD-10-CM | POA: Diagnosis not present

## 2022-05-13 DIAGNOSIS — Z853 Personal history of malignant neoplasm of breast: Secondary | ICD-10-CM | POA: Diagnosis not present

## 2022-05-13 DIAGNOSIS — Z9181 History of falling: Secondary | ICD-10-CM | POA: Diagnosis not present

## 2022-05-13 DIAGNOSIS — Z7982 Long term (current) use of aspirin: Secondary | ICD-10-CM | POA: Diagnosis not present

## 2022-05-13 DIAGNOSIS — R32 Unspecified urinary incontinence: Secondary | ICD-10-CM | POA: Diagnosis not present

## 2022-05-13 DIAGNOSIS — G20 Parkinson's disease: Secondary | ICD-10-CM | POA: Diagnosis not present

## 2022-05-13 DIAGNOSIS — G620 Drug-induced polyneuropathy: Secondary | ICD-10-CM | POA: Diagnosis not present

## 2022-05-13 DIAGNOSIS — M8580 Other specified disorders of bone density and structure, unspecified site: Secondary | ICD-10-CM | POA: Diagnosis not present

## 2022-05-13 DIAGNOSIS — Z90710 Acquired absence of both cervix and uterus: Secondary | ICD-10-CM | POA: Diagnosis not present

## 2022-05-13 DIAGNOSIS — N8110 Cystocele, unspecified: Secondary | ICD-10-CM | POA: Diagnosis not present

## 2022-05-13 DIAGNOSIS — M21619 Bunion of unspecified foot: Secondary | ICD-10-CM | POA: Diagnosis not present

## 2022-05-17 ENCOUNTER — Telehealth: Payer: Self-pay | Admitting: Family Medicine

## 2022-05-17 DIAGNOSIS — M21619 Bunion of unspecified foot: Secondary | ICD-10-CM | POA: Diagnosis not present

## 2022-05-17 DIAGNOSIS — G20 Parkinson's disease: Secondary | ICD-10-CM | POA: Diagnosis not present

## 2022-05-17 DIAGNOSIS — N8110 Cystocele, unspecified: Secondary | ICD-10-CM | POA: Diagnosis not present

## 2022-05-17 DIAGNOSIS — G620 Drug-induced polyneuropathy: Secondary | ICD-10-CM | POA: Diagnosis not present

## 2022-05-17 DIAGNOSIS — F411 Generalized anxiety disorder: Secondary | ICD-10-CM | POA: Diagnosis not present

## 2022-05-17 DIAGNOSIS — M8580 Other specified disorders of bone density and structure, unspecified site: Secondary | ICD-10-CM | POA: Diagnosis not present

## 2022-05-17 NOTE — Telephone Encounter (Signed)
Verbal given 

## 2022-05-17 NOTE — Telephone Encounter (Signed)
Claiborne Billings from [facility] calling to get verbal orders for a medical social worker and for the nurse to come out once per week for 4 weeks. Please call [phone] to advise.

## 2022-05-18 DIAGNOSIS — M8580 Other specified disorders of bone density and structure, unspecified site: Secondary | ICD-10-CM | POA: Diagnosis not present

## 2022-05-18 DIAGNOSIS — G620 Drug-induced polyneuropathy: Secondary | ICD-10-CM | POA: Diagnosis not present

## 2022-05-18 DIAGNOSIS — G20 Parkinson's disease: Secondary | ICD-10-CM | POA: Diagnosis not present

## 2022-05-18 DIAGNOSIS — F411 Generalized anxiety disorder: Secondary | ICD-10-CM | POA: Diagnosis not present

## 2022-05-18 DIAGNOSIS — N8110 Cystocele, unspecified: Secondary | ICD-10-CM | POA: Diagnosis not present

## 2022-05-18 DIAGNOSIS — M21619 Bunion of unspecified foot: Secondary | ICD-10-CM | POA: Diagnosis not present

## 2022-05-24 ENCOUNTER — Telehealth: Payer: Self-pay | Admitting: Family Medicine

## 2022-05-24 NOTE — Telephone Encounter (Signed)
FYI

## 2022-05-24 NOTE — Telephone Encounter (Signed)
OT therapist states pt does not need OT and can just continue with PT.

## 2022-05-25 DIAGNOSIS — M21619 Bunion of unspecified foot: Secondary | ICD-10-CM | POA: Diagnosis not present

## 2022-05-25 DIAGNOSIS — M8580 Other specified disorders of bone density and structure, unspecified site: Secondary | ICD-10-CM | POA: Diagnosis not present

## 2022-05-25 DIAGNOSIS — G620 Drug-induced polyneuropathy: Secondary | ICD-10-CM | POA: Diagnosis not present

## 2022-05-25 DIAGNOSIS — F411 Generalized anxiety disorder: Secondary | ICD-10-CM | POA: Diagnosis not present

## 2022-05-25 DIAGNOSIS — N8110 Cystocele, unspecified: Secondary | ICD-10-CM | POA: Diagnosis not present

## 2022-05-25 DIAGNOSIS — G20 Parkinson's disease: Secondary | ICD-10-CM | POA: Diagnosis not present

## 2022-05-26 DIAGNOSIS — G620 Drug-induced polyneuropathy: Secondary | ICD-10-CM | POA: Diagnosis not present

## 2022-05-26 DIAGNOSIS — F411 Generalized anxiety disorder: Secondary | ICD-10-CM | POA: Diagnosis not present

## 2022-05-26 DIAGNOSIS — M8580 Other specified disorders of bone density and structure, unspecified site: Secondary | ICD-10-CM | POA: Diagnosis not present

## 2022-05-26 DIAGNOSIS — G20 Parkinson's disease: Secondary | ICD-10-CM | POA: Diagnosis not present

## 2022-05-26 DIAGNOSIS — M21619 Bunion of unspecified foot: Secondary | ICD-10-CM | POA: Diagnosis not present

## 2022-05-26 DIAGNOSIS — N8110 Cystocele, unspecified: Secondary | ICD-10-CM | POA: Diagnosis not present

## 2022-05-31 ENCOUNTER — Telehealth: Payer: Self-pay | Admitting: Family Medicine

## 2022-05-31 ENCOUNTER — Other Ambulatory Visit: Payer: Self-pay | Admitting: Family Medicine

## 2022-05-31 DIAGNOSIS — N8110 Cystocele, unspecified: Secondary | ICD-10-CM | POA: Diagnosis not present

## 2022-05-31 DIAGNOSIS — G20 Parkinson's disease: Secondary | ICD-10-CM | POA: Diagnosis not present

## 2022-05-31 DIAGNOSIS — M21619 Bunion of unspecified foot: Secondary | ICD-10-CM | POA: Diagnosis not present

## 2022-05-31 DIAGNOSIS — G620 Drug-induced polyneuropathy: Secondary | ICD-10-CM | POA: Diagnosis not present

## 2022-05-31 DIAGNOSIS — F411 Generalized anxiety disorder: Secondary | ICD-10-CM | POA: Diagnosis not present

## 2022-05-31 DIAGNOSIS — R296 Repeated falls: Secondary | ICD-10-CM

## 2022-05-31 DIAGNOSIS — M8580 Other specified disorders of bone density and structure, unspecified site: Secondary | ICD-10-CM | POA: Diagnosis not present

## 2022-05-31 NOTE — Telephone Encounter (Signed)
Hh wanted to report a fall today. She stated pt hit the back of her head but was not complaining of any pain. She states pt falls a lot/ almost daily and does not always gets documented. She does not feel pt is safe alone and needs 24/7 care. Her number is listed for any questions.

## 2022-06-01 ENCOUNTER — Telehealth: Payer: Self-pay | Admitting: *Deleted

## 2022-06-01 NOTE — Chronic Care Management (AMB) (Signed)
  Care Coordination   Note   06/01/2022 Name: BOOTS MCGLOWN MRN: [id number] DOB: Aug 02, 1946  KENYA KOOK is a 76 y.o. year old female who sees Carollee Herter, Alferd Apa, DO for primary care. I reached out to [name] by phone today to offer care coordination services.  Received referral   Ms. Arrambide was given information about Care Coordination services today including:   The Care Coordination services include support from the care team which includes your Nurse Coordinator, Clinical Social Worker, or Pharmacist.  The Care Coordination team is here to help remove barriers to the health concerns and goals most important to you. Care Coordination services are voluntary, and the patient may decline or stop services at any time by request to their care team member.   Care Coordination Consent Status: Patient agreed to services and verbal consent obtained.   Follow up plan:  Telephone appointment with care coordination team member scheduled for:  06/02/2022  Encounter Outcome:  Pt. Scheduled

## 2022-06-02 ENCOUNTER — Encounter: Payer: Self-pay | Admitting: Licensed Clinical Social Worker

## 2022-06-02 ENCOUNTER — Telehealth: Payer: Self-pay

## 2022-06-02 ENCOUNTER — Ambulatory Visit: Payer: Self-pay | Admitting: Licensed Clinical Social Worker

## 2022-06-02 DIAGNOSIS — G20 Parkinson's disease: Secondary | ICD-10-CM | POA: Diagnosis not present

## 2022-06-02 DIAGNOSIS — F411 Generalized anxiety disorder: Secondary | ICD-10-CM | POA: Diagnosis not present

## 2022-06-02 DIAGNOSIS — M21619 Bunion of unspecified foot: Secondary | ICD-10-CM | POA: Diagnosis not present

## 2022-06-02 DIAGNOSIS — M8580 Other specified disorders of bone density and structure, unspecified site: Secondary | ICD-10-CM | POA: Diagnosis not present

## 2022-06-02 DIAGNOSIS — M810 Age-related osteoporosis without current pathological fracture: Secondary | ICD-10-CM | POA: Insufficient documentation

## 2022-06-02 DIAGNOSIS — G620 Drug-induced polyneuropathy: Secondary | ICD-10-CM | POA: Diagnosis not present

## 2022-06-02 DIAGNOSIS — N8110 Cystocele, unspecified: Secondary | ICD-10-CM | POA: Diagnosis not present

## 2022-06-02 NOTE — Telephone Encounter (Signed)
   Telephone encounter was:  Successful.  06/02/2022 Name: Amanda [name] MRN: [id number] DOB: [birth year]  Amanda [name] is a 76 y.o. year old female who is a primary care patient of Ann Held, DO . The community resource team was consulted for assistance with  na  Care guide performed the following interventions: Patient provided with information about care guide support team and interviewed to confirm resource needs. Patient has no needs at this time  Follow Up Plan:  No further follow up planned at this time. The patient has been provided with needed resources.    [place], Care Management  [phone] [address], [place], [place] [address] Phone: [phone] Email: [email or web address]

## 2022-06-02 NOTE — Patient Outreach (Signed)
  Care Coordination  Multidisciplinary Case Review Note    06/02/2022 Name: Amanda [name] MRN: [id number] DOB: [birth year]  Amanda [name] is a 76 y.o. year old female who sees Carollee Herter, Alferd Apa, DO for primary care.  The  multidisciplinary care team met today to review patient care needs and barriers.    Consultation and Collaboration  reference care coordination for Level of Care Concerns. Patient was not interviewed or contacted during this encounter.   Intervention:Conducted brief assessment, recommendations and relevant information discussed.  Collaborated with [place] Worker [place]  to assist with meeting patient's needs.   [place] Social worker has assessed needs and provided all needed support Patient and husband will be moving into assisted living, per [place] there are no needs at this time   SDOH assessments and interventions completed:  No    Care Coordination Interventions Activated:  Yes   Care Coordination Interventions:  Yes, provided   Follow up plan: No further intervention required.   Multidisciplinary Team Attendees:   Casimer Lanius, LCSW ;  [place] Worker [place], MSW [place], [place] [phone]

## 2022-06-03 DIAGNOSIS — G620 Drug-induced polyneuropathy: Secondary | ICD-10-CM | POA: Diagnosis not present

## 2022-06-03 DIAGNOSIS — G20 Parkinson's disease: Secondary | ICD-10-CM | POA: Diagnosis not present

## 2022-06-03 DIAGNOSIS — N8110 Cystocele, unspecified: Secondary | ICD-10-CM | POA: Diagnosis not present

## 2022-06-03 DIAGNOSIS — M8580 Other specified disorders of bone density and structure, unspecified site: Secondary | ICD-10-CM | POA: Diagnosis not present

## 2022-06-03 DIAGNOSIS — M21619 Bunion of unspecified foot: Secondary | ICD-10-CM | POA: Diagnosis not present

## 2022-06-03 DIAGNOSIS — F411 Generalized anxiety disorder: Secondary | ICD-10-CM | POA: Diagnosis not present

## 2022-06-03 NOTE — Telephone Encounter (Signed)
See social worker notes, she has been provided w/ resource information.

## 2022-06-06 ENCOUNTER — Ambulatory Visit: Payer: Medicare Other

## 2022-06-07 ENCOUNTER — Ambulatory Visit (INDEPENDENT_AMBULATORY_CARE_PROVIDER_SITE_OTHER): Payer: Medicare Other | Admitting: Family

## 2022-06-07 ENCOUNTER — Encounter: Payer: Self-pay | Admitting: Family

## 2022-06-07 ENCOUNTER — Ambulatory Visit (HOSPITAL_BASED_OUTPATIENT_CLINIC_OR_DEPARTMENT_OTHER)
Admission: RE | Admit: 2022-06-07 | Discharge: 2022-06-07 | Disposition: A | Discharge status: Home / Self Care | Payer: Medicare Other | Source: Ambulatory Visit | Attending: Family | Admitting: Family

## 2022-06-07 VITALS — Ht 64.0 in | Wt 128.4 lb

## 2022-06-07 DIAGNOSIS — R300 Dysuria: Secondary | ICD-10-CM

## 2022-06-07 DIAGNOSIS — R0781 Pleurodynia: Secondary | ICD-10-CM | POA: Insufficient documentation

## 2022-06-07 DIAGNOSIS — G20 Parkinson's disease: Secondary | ICD-10-CM

## 2022-06-07 DIAGNOSIS — Z23 Encounter for immunization: Secondary | ICD-10-CM

## 2022-06-07 NOTE — Progress Notes (Signed)
Amanda [name] is a 76 y.o. female with the following history as recorded in EpicCare:  Patient Active Problem List   Diagnosis Date Noted   Generalized anxiety disorder 06/02/2022   Osteoporosis 06/02/2022   Screening, ischemic heart disease 02/21/2022   Urinary incontinence 02/21/2022   Frequent falls 02/21/2022   Acute right ankle pain 06/17/2021   Drug-induced polyneuropathy ([place]) 12/14/2020   Diarrhea 12/14/2020   Incontinence in female 12/14/2020   Dyspepsia 12/14/2020   Bunion 08/19/2019   Hammer toe 08/19/2019   Breast cancer, stage 2, left ([place]) 11/08/2018   Flexural eczema 05/13/2018   Malignant neoplasm of lower-outer quadrant of left breast of female, estrogen receptor positive ([place]) 08/14/2017   Edema, lower extremity 05/02/2017   Insect sting 05/09/2016   Prolapse of female pelvic organs 01/11/2016   Biceps tendinitis on left 02/18/2014   Parkinson disease ([place]) 10/03/2012   Sinusitis 11/09/2011   Anxiety 06/03/2009   OVARIAN CYST 06/02/2008   Osteopenia 06/02/2008   FATIGUE 06/02/2008    Current Outpatient Medications  Medication Sig Dispense Refill   aspirin EC 325 MG tablet Take 325 mg by mouth daily as needed for moderate pain.     b complex vitamins tablet Take 1 tablet by mouth daily. MEGA FOOD BALANCE SOURCE     carbidopa-levodopa (SINEMET IR) 25-100 MG tablet Take 1 tablet by mouth 2 (two) times daily.     Cholecalciferol (VITAMIN D3) 1000 UNIT/SPRAY LIQD Take 2 sprays by mouth 2 (two) times daily.     famotidine (PEPCID) 20 MG tablet TAKE 1 TABLET BY MOUTH TWICE A DAY 180 tablet 1   letrozole (FEMARA) 2.5 MG tablet Take 1 tablet (2.5 mg total) by mouth daily. 90 tablet 2   rivastigmine (EXELON) 4.6 mg/24hr APPLY 1 PATCH TOPICALLY ONCE DAILY     sertraline (ZOLOFT) 50 MG tablet Take 1 tablet (50 mg total) by mouth at bedtime. 90 tablet 3   AMBULATORY NON FORMULARY MEDICATION Medication Name: Compression Stockings 20-53mHg  1 pair (Patient not  taking: Reported on 06/07/2022) 1 each 0   CALCIUM-MAGNESIUM-VITAMIN D PO Take 2 tablets by mouth 2 (two) times daily. (Patient not taking: Reported on 06/07/2022)     Probiotic Product (PROBIOTIC DAILY) CAPS Take 1 capsule by mouth daily as needed (indigestion).  (Patient not taking: Reported on 06/07/2022)     Propylene Glycol-Glycerin (SOOTHE OP) Place 2 drops into both eyes daily as needed (dry eyes). (Patient not taking: Reported on 06/07/2022)     Vibegron (GEMTESA) 75 MG TABS TAKE 1 TABLET BY MOUTH EVERY DAY (Patient not taking: Reported on 06/07/2022) 90 tablet 3   No current facility-administered medications for this visit.    Allergies: Penicillins  Past Medical History:  Diagnosis Date   [place], [place] breast [place]    Frequency of urination    GAD (generalized anxiety disorder)    Osteopenia    [place]inson disease ([place]    neurologist-  dr sArvella Nigh-  right side predominant   Parkinson disease ([place]    Prolapse of vaginal wall    Urgency of urination    Wears glasses     Past Surgical History:  Procedure Laterality Date   ABDOMINAL HYSTERECTOMY     BREAST BIOPSY Left 07/2017   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   COLONOSCOPY  last one 2007   CYSTOCELE REPAIR N/A 01/11/2016   Procedure: ANTERIOR REPAIR (CYSTOCELE) COLOPLAST FASCIA  SACROSPINOUS FIXATION;  Surgeon: SCarolan Clines MD;  Location:  [facility];  Service: Urology;  Laterality: N/A;   CYSTOSCOPY W/ URETERAL STENT PLACEMENT Bilateral 01/11/2016   Procedure: CYSTOSCOPY WITH BILATERAL RETROGRADE PYELOGRAM/URETERAL STENT PLACEMENT;  Surgeon: Carolan Clines, MD;  Location: [facility];  Service: Urology;  Laterality: Bilateral;   ESOPHAGOGASTRODUODENOSCOPY  2007   EXTERNAL EAR SURGERY Bilateral age 28   LAPAROSCOPIC OVARIAN CYSTECTOMY  March 2008   unilateral   MASTECTOMY Left 2018   PUBOVAGINAL SLING N/A 01/11/2016   Procedure:  ALTIS SLING SINGLE INCISION;   Surgeon: Carolan Clines, MD;  [facility];  Service: Urology;  Laterality: N/A;   RECTOCELE REPAIR N/A 01/11/2016   Procedure: POSTERIOR REPAIR (RECTOCELE), PERINEAPLASTY;  Surgeon: Dian Queen, MD;  Location: [place];  Service: Gynecology;  Laterality: N/A;   SIMPLE MASTECTOMY WITH AXILLARY SENTINEL NODE BIOPSY Left 11/08/2018   Procedure: LEFT SIMPLE MASTECTOMY WITH LEFT  AXILLARY SENTINEL NODE MAPPING;  Surgeon: Erroll Luna, MD;  Location: [facility];  Service: General;  Laterality: Left;   VAGINAL HYSTERECTOMY N/A 01/11/2016   Procedure: TOTAL VAGINAL HYSTERECTOMY ;  Surgeon: Dian Queen, MD;  Location: [facility] [place];  Service: Gynecology;  Laterality: N/A;    Family History  Problem Relation Age of Onset   Heart disease Mother        enlarged heart   Cancer Father        pancreatic   COPD Sister    Pulmonary embolism Sister    Edema Sister        Ankles   Heart disease Brother    Polycystic kidney disease Son     Social History   Tobacco Use   Smoking status: Never   Smokeless tobacco: Never  Substance Use Topics   Alcohol use: No    Subjective:   Accompanied by caregiver who has a list of concerns that family would like addressed for patient; patient is limited in ability to help provide history today;  1) Family would like patient checked for UTI; 2) Requesting left ribs to be evaluated today; 3) Concern for localized reaction secondary to Exelon patch; 4) Would like flu shot;      Objective:  Vitals:   06/07/22 1112  Weight: 128 lb 6.4 oz (58.2 kg)  Height: '5\' 4"'  (1.626 m)    General: Well developed, well nourished, in no acute distress  Skin : Warm and dry.  Head: Normocephalic and atraumatic  Eyes: Sclera and conjunctiva clear;  Lungs: Respirations unlabored;  Musculoskeletal: No deformities; no active joint inflammation; no bruising or swelling noted over left ribs Extremities: No edema,  cyanosis, clubbing  Vessels: Symmetric bilaterally  Neurologic: Alert and oriented; speech intact; face symmetrical; in wheelchair today;   Assessment:  1. Rib pain on left side   2. Dysuria   3. Need for influenza vaccination   4. Parkinson disease ([place])     Plan:  Will update X-ray today; Patient unable to give sample today- she is given kit to collect sample at home and bring back for urine culture; Flu shot updated; Family is instructed to call her neurologist to discuss alternative for the Exelon patch;  Time spent 30+ minutes  No follow-ups on file.  Orders Placed This Encounter  Procedures   Urine Culture   DG Ribs Unilateral Left    Standing Status:   Future    Standing Expiration Date:   06/08/2023    Order Specific Question:   Reason for Exam (SYMPTOM  OR  DIAGNOSIS REQUIRED)    Answer:   left rib pain    Order Specific Question:   Preferred imaging location?    Answer:   [facility]   Flu Vaccine QUAD High Dose(Fluad)    Requested Prescriptions    No prescriptions requested or ordered in this encounter

## 2022-06-07 NOTE — Patient Instructions (Addendum)
Please reach out to the neurologist to discuss concerns about the Exelon; Dr. Etter Sjogren is not managing this medication. The prescriber needs to know about concerns so alternatives can be prescribed.   Please bring back a urine sample as discussed so we can see if you have a UTI.

## 2022-06-08 DIAGNOSIS — G20 Parkinson's disease: Secondary | ICD-10-CM | POA: Diagnosis not present

## 2022-06-08 DIAGNOSIS — M21619 Bunion of unspecified foot: Secondary | ICD-10-CM | POA: Diagnosis not present

## 2022-06-08 DIAGNOSIS — F411 Generalized anxiety disorder: Secondary | ICD-10-CM | POA: Diagnosis not present

## 2022-06-08 DIAGNOSIS — G620 Drug-induced polyneuropathy: Secondary | ICD-10-CM | POA: Diagnosis not present

## 2022-06-08 DIAGNOSIS — M8580 Other specified disorders of bone density and structure, unspecified site: Secondary | ICD-10-CM | POA: Diagnosis not present

## 2022-06-08 DIAGNOSIS — N8110 Cystocele, unspecified: Secondary | ICD-10-CM | POA: Diagnosis not present

## 2022-06-10 ENCOUNTER — Other Ambulatory Visit: Payer: Self-pay | Admitting: Family

## 2022-06-10 DIAGNOSIS — G620 Drug-induced polyneuropathy: Secondary | ICD-10-CM | POA: Diagnosis not present

## 2022-06-10 DIAGNOSIS — F411 Generalized anxiety disorder: Secondary | ICD-10-CM | POA: Diagnosis not present

## 2022-06-10 DIAGNOSIS — M8580 Other specified disorders of bone density and structure, unspecified site: Secondary | ICD-10-CM | POA: Diagnosis not present

## 2022-06-10 DIAGNOSIS — G20 Parkinson's disease: Secondary | ICD-10-CM | POA: Diagnosis not present

## 2022-06-10 DIAGNOSIS — M21619 Bunion of unspecified foot: Secondary | ICD-10-CM | POA: Diagnosis not present

## 2022-06-10 DIAGNOSIS — N8110 Cystocele, unspecified: Secondary | ICD-10-CM | POA: Diagnosis not present

## 2022-06-10 LAB — URINE CULTURE
MICRO NUMBER:: 13937817
SPECIMEN QUALITY:: ADEQUATE

## 2022-06-10 MED ORDER — SULFAMETHOXAZOLE-TRIMETHOPRIM 800-160 MG PO TABS: 1.0000 | ORAL_TABLET | Freq: Two times a day (BID) | ORAL | 0 refills | Status: DC

## 2022-06-12 DIAGNOSIS — Z853 Personal history of malignant neoplasm of breast: Secondary | ICD-10-CM | POA: Diagnosis not present

## 2022-06-12 DIAGNOSIS — N8110 Cystocele, unspecified: Secondary | ICD-10-CM | POA: Diagnosis not present

## 2022-06-12 DIAGNOSIS — Z90710 Acquired absence of both cervix and uterus: Secondary | ICD-10-CM | POA: Diagnosis not present

## 2022-06-12 DIAGNOSIS — Z87440 Personal history of urinary (tract) infections: Secondary | ICD-10-CM | POA: Diagnosis not present

## 2022-06-12 DIAGNOSIS — F411 Generalized anxiety disorder: Secondary | ICD-10-CM | POA: Diagnosis not present

## 2022-06-12 DIAGNOSIS — G620 Drug-induced polyneuropathy: Secondary | ICD-10-CM | POA: Diagnosis not present

## 2022-06-12 DIAGNOSIS — M8580 Other specified disorders of bone density and structure, unspecified site: Secondary | ICD-10-CM | POA: Diagnosis not present

## 2022-06-12 DIAGNOSIS — Z7982 Long term (current) use of aspirin: Secondary | ICD-10-CM | POA: Diagnosis not present

## 2022-06-12 DIAGNOSIS — Z9181 History of falling: Secondary | ICD-10-CM | POA: Diagnosis not present

## 2022-06-12 DIAGNOSIS — Z556 Problems related to health literacy: Secondary | ICD-10-CM | POA: Diagnosis not present

## 2022-06-12 DIAGNOSIS — G20 Parkinson's disease: Secondary | ICD-10-CM | POA: Diagnosis not present

## 2022-06-12 DIAGNOSIS — M21619 Bunion of unspecified foot: Secondary | ICD-10-CM | POA: Diagnosis not present

## 2022-06-12 DIAGNOSIS — R32 Unspecified urinary incontinence: Secondary | ICD-10-CM | POA: Diagnosis not present

## 2022-06-13 ENCOUNTER — Encounter: Payer: Self-pay | Admitting: Internal Medicine

## 2022-06-14 DIAGNOSIS — G20 Parkinson's disease: Secondary | ICD-10-CM | POA: Diagnosis not present

## 2022-06-14 DIAGNOSIS — F411 Generalized anxiety disorder: Secondary | ICD-10-CM | POA: Diagnosis not present

## 2022-06-14 DIAGNOSIS — G620 Drug-induced polyneuropathy: Secondary | ICD-10-CM | POA: Diagnosis not present

## 2022-06-14 DIAGNOSIS — M8580 Other specified disorders of bone density and structure, unspecified site: Secondary | ICD-10-CM | POA: Diagnosis not present

## 2022-06-14 DIAGNOSIS — M21619 Bunion of unspecified foot: Secondary | ICD-10-CM | POA: Diagnosis not present

## 2022-06-14 DIAGNOSIS — N8110 Cystocele, unspecified: Secondary | ICD-10-CM | POA: Diagnosis not present

## 2022-06-16 DIAGNOSIS — G20 Parkinson's disease: Secondary | ICD-10-CM | POA: Diagnosis not present

## 2022-06-16 DIAGNOSIS — F411 Generalized anxiety disorder: Secondary | ICD-10-CM | POA: Diagnosis not present

## 2022-06-16 DIAGNOSIS — M8580 Other specified disorders of bone density and structure, unspecified site: Secondary | ICD-10-CM | POA: Diagnosis not present

## 2022-06-16 DIAGNOSIS — N8110 Cystocele, unspecified: Secondary | ICD-10-CM | POA: Diagnosis not present

## 2022-06-16 DIAGNOSIS — G620 Drug-induced polyneuropathy: Secondary | ICD-10-CM | POA: Diagnosis not present

## 2022-06-16 DIAGNOSIS — M21619 Bunion of unspecified foot: Secondary | ICD-10-CM | POA: Diagnosis not present

## 2022-06-17 DIAGNOSIS — F411 Generalized anxiety disorder: Secondary | ICD-10-CM | POA: Diagnosis not present

## 2022-06-17 DIAGNOSIS — M8580 Other specified disorders of bone density and structure, unspecified site: Secondary | ICD-10-CM | POA: Diagnosis not present

## 2022-06-17 DIAGNOSIS — G20 Parkinson's disease: Secondary | ICD-10-CM | POA: Diagnosis not present

## 2022-06-17 DIAGNOSIS — G620 Drug-induced polyneuropathy: Secondary | ICD-10-CM | POA: Diagnosis not present

## 2022-06-17 DIAGNOSIS — M21619 Bunion of unspecified foot: Secondary | ICD-10-CM | POA: Diagnosis not present

## 2022-06-17 DIAGNOSIS — N8110 Cystocele, unspecified: Secondary | ICD-10-CM | POA: Diagnosis not present

## 2022-06-20 ENCOUNTER — Encounter: Payer: Self-pay | Admitting: Family Medicine

## 2022-06-20 DIAGNOSIS — G20 Parkinson's disease: Secondary | ICD-10-CM | POA: Diagnosis not present

## 2022-06-20 DIAGNOSIS — M8580 Other specified disorders of bone density and structure, unspecified site: Secondary | ICD-10-CM | POA: Diagnosis not present

## 2022-06-20 DIAGNOSIS — N8110 Cystocele, unspecified: Secondary | ICD-10-CM | POA: Diagnosis not present

## 2022-06-20 DIAGNOSIS — F411 Generalized anxiety disorder: Secondary | ICD-10-CM | POA: Diagnosis not present

## 2022-06-20 DIAGNOSIS — G620 Drug-induced polyneuropathy: Secondary | ICD-10-CM | POA: Diagnosis not present

## 2022-06-20 DIAGNOSIS — M21619 Bunion of unspecified foot: Secondary | ICD-10-CM | POA: Diagnosis not present

## 2022-06-24 DIAGNOSIS — F411 Generalized anxiety disorder: Secondary | ICD-10-CM | POA: Diagnosis not present

## 2022-06-24 DIAGNOSIS — G620 Drug-induced polyneuropathy: Secondary | ICD-10-CM | POA: Diagnosis not present

## 2022-06-24 DIAGNOSIS — G20 Parkinson's disease: Secondary | ICD-10-CM | POA: Diagnosis not present

## 2022-06-24 DIAGNOSIS — N8110 Cystocele, unspecified: Secondary | ICD-10-CM | POA: Diagnosis not present

## 2022-06-24 DIAGNOSIS — M21619 Bunion of unspecified foot: Secondary | ICD-10-CM | POA: Diagnosis not present

## 2022-06-24 DIAGNOSIS — M8580 Other specified disorders of bone density and structure, unspecified site: Secondary | ICD-10-CM | POA: Diagnosis not present

## 2022-06-28 NOTE — Telephone Encounter (Signed)
FYI

## 2022-06-29 ENCOUNTER — Other Ambulatory Visit: Payer: Self-pay

## 2022-06-29 ENCOUNTER — Telehealth: Payer: Self-pay | Admitting: Family Medicine

## 2022-06-29 DIAGNOSIS — M21619 Bunion of unspecified foot: Secondary | ICD-10-CM | POA: Diagnosis not present

## 2022-06-29 DIAGNOSIS — F411 Generalized anxiety disorder: Secondary | ICD-10-CM | POA: Diagnosis not present

## 2022-06-29 DIAGNOSIS — N8110 Cystocele, unspecified: Secondary | ICD-10-CM | POA: Diagnosis not present

## 2022-06-29 DIAGNOSIS — G20 Parkinson's disease: Secondary | ICD-10-CM | POA: Diagnosis not present

## 2022-06-29 DIAGNOSIS — G620 Drug-induced polyneuropathy: Secondary | ICD-10-CM | POA: Diagnosis not present

## 2022-06-29 DIAGNOSIS — N390 Urinary tract infection, site not specified: Secondary | ICD-10-CM

## 2022-06-29 DIAGNOSIS — M8580 Other specified disorders of bone density and structure, unspecified site: Secondary | ICD-10-CM | POA: Diagnosis not present

## 2022-06-29 NOTE — Telephone Encounter (Signed)
Orders placed. Family member came by to pick cup and hat.

## 2022-06-29 NOTE — Telephone Encounter (Signed)
Medi hh just wanted to report a fall with no injuries. Stated she fell trying to put away clothes with caregiver present.

## 2022-06-29 NOTE — Telephone Encounter (Signed)
FYI

## 2022-07-06 ENCOUNTER — Other Ambulatory Visit: Payer: Medicare Other

## 2022-07-06 DIAGNOSIS — N390 Urinary tract infection, site not specified: Secondary | ICD-10-CM

## 2022-07-07 LAB — URINE CULTURE
MICRO NUMBER:: 14067694
SPECIMEN QUALITY:: ADEQUATE

## 2022-07-08 ENCOUNTER — Telehealth: Payer: Self-pay | Admitting: Family Medicine

## 2022-07-08 DIAGNOSIS — M8580 Other specified disorders of bone density and structure, unspecified site: Secondary | ICD-10-CM | POA: Diagnosis not present

## 2022-07-08 DIAGNOSIS — M21619 Bunion of unspecified foot: Secondary | ICD-10-CM | POA: Diagnosis not present

## 2022-07-08 DIAGNOSIS — G20 Parkinson's disease: Secondary | ICD-10-CM | POA: Diagnosis not present

## 2022-07-08 DIAGNOSIS — N8110 Cystocele, unspecified: Secondary | ICD-10-CM | POA: Diagnosis not present

## 2022-07-08 DIAGNOSIS — F411 Generalized anxiety disorder: Secondary | ICD-10-CM | POA: Diagnosis not present

## 2022-07-08 DIAGNOSIS — G620 Drug-induced polyneuropathy: Secondary | ICD-10-CM | POA: Diagnosis not present

## 2022-07-08 NOTE — Telephone Encounter (Signed)
Caller/Agency: Sonia Baller ([place]) Callback Number: [phone] Requesting OT/PT/Skilled Nursing/Social Work/Speech Therapy: Speech Therapy Frequency: 1 w 6

## 2022-07-08 NOTE — Telephone Encounter (Signed)
Verbal given 

## 2022-07-12 DIAGNOSIS — M21619 Bunion of unspecified foot: Secondary | ICD-10-CM | POA: Diagnosis not present

## 2022-07-12 DIAGNOSIS — Z9181 History of falling: Secondary | ICD-10-CM | POA: Diagnosis not present

## 2022-07-12 DIAGNOSIS — M8580 Other specified disorders of bone density and structure, unspecified site: Secondary | ICD-10-CM | POA: Diagnosis not present

## 2022-07-12 DIAGNOSIS — F411 Generalized anxiety disorder: Secondary | ICD-10-CM | POA: Diagnosis not present

## 2022-07-12 DIAGNOSIS — N8110 Cystocele, unspecified: Secondary | ICD-10-CM | POA: Diagnosis not present

## 2022-07-12 DIAGNOSIS — G20A1 Parkinson's disease without dyskinesia, without mention of fluctuations: Secondary | ICD-10-CM | POA: Diagnosis not present

## 2022-07-12 DIAGNOSIS — Z853 Personal history of malignant neoplasm of breast: Secondary | ICD-10-CM | POA: Diagnosis not present

## 2022-07-12 DIAGNOSIS — Z7982 Long term (current) use of aspirin: Secondary | ICD-10-CM | POA: Diagnosis not present

## 2022-07-12 DIAGNOSIS — Z90710 Acquired absence of both cervix and uterus: Secondary | ICD-10-CM | POA: Diagnosis not present

## 2022-07-12 DIAGNOSIS — Z87440 Personal history of urinary (tract) infections: Secondary | ICD-10-CM | POA: Diagnosis not present

## 2022-07-12 DIAGNOSIS — G620 Drug-induced polyneuropathy: Secondary | ICD-10-CM | POA: Diagnosis not present

## 2022-07-12 DIAGNOSIS — R32 Unspecified urinary incontinence: Secondary | ICD-10-CM | POA: Diagnosis not present

## 2022-07-12 DIAGNOSIS — Z556 Problems related to health literacy: Secondary | ICD-10-CM | POA: Diagnosis not present

## 2022-07-14 DIAGNOSIS — M8580 Other specified disorders of bone density and structure, unspecified site: Secondary | ICD-10-CM | POA: Diagnosis not present

## 2022-07-14 DIAGNOSIS — G620 Drug-induced polyneuropathy: Secondary | ICD-10-CM | POA: Diagnosis not present

## 2022-07-14 DIAGNOSIS — N8110 Cystocele, unspecified: Secondary | ICD-10-CM | POA: Diagnosis not present

## 2022-07-14 DIAGNOSIS — G20A1 Parkinson's disease without dyskinesia, without mention of fluctuations: Secondary | ICD-10-CM | POA: Diagnosis not present

## 2022-07-14 DIAGNOSIS — M21619 Bunion of unspecified foot: Secondary | ICD-10-CM | POA: Diagnosis not present

## 2022-07-14 DIAGNOSIS — F411 Generalized anxiety disorder: Secondary | ICD-10-CM | POA: Diagnosis not present

## 2022-07-18 DIAGNOSIS — Z880 Allergy status to penicillin: Secondary | ICD-10-CM | POA: Diagnosis not present

## 2022-07-18 DIAGNOSIS — G20A1 Parkinson's disease without dyskinesia, without mention of fluctuations: Secondary | ICD-10-CM | POA: Diagnosis not present

## 2022-07-18 DIAGNOSIS — G3184 Mild cognitive impairment, so stated: Secondary | ICD-10-CM | POA: Diagnosis not present

## 2022-07-20 DIAGNOSIS — M8580 Other specified disorders of bone density and structure, unspecified site: Secondary | ICD-10-CM | POA: Diagnosis not present

## 2022-07-20 DIAGNOSIS — G20A1 Parkinson's disease without dyskinesia, without mention of fluctuations: Secondary | ICD-10-CM | POA: Diagnosis not present

## 2022-07-20 DIAGNOSIS — F411 Generalized anxiety disorder: Secondary | ICD-10-CM | POA: Diagnosis not present

## 2022-07-20 DIAGNOSIS — N8110 Cystocele, unspecified: Secondary | ICD-10-CM | POA: Diagnosis not present

## 2022-07-20 DIAGNOSIS — M21619 Bunion of unspecified foot: Secondary | ICD-10-CM | POA: Diagnosis not present

## 2022-07-20 DIAGNOSIS — G620 Drug-induced polyneuropathy: Secondary | ICD-10-CM | POA: Diagnosis not present

## 2022-07-27 DIAGNOSIS — G20A1 Parkinson's disease without dyskinesia, without mention of fluctuations: Secondary | ICD-10-CM | POA: Diagnosis not present

## 2022-07-27 DIAGNOSIS — M21619 Bunion of unspecified foot: Secondary | ICD-10-CM | POA: Diagnosis not present

## 2022-07-27 DIAGNOSIS — M8580 Other specified disorders of bone density and structure, unspecified site: Secondary | ICD-10-CM | POA: Diagnosis not present

## 2022-07-27 DIAGNOSIS — F411 Generalized anxiety disorder: Secondary | ICD-10-CM | POA: Diagnosis not present

## 2022-07-27 DIAGNOSIS — N8110 Cystocele, unspecified: Secondary | ICD-10-CM | POA: Diagnosis not present

## 2022-07-27 DIAGNOSIS — G620 Drug-induced polyneuropathy: Secondary | ICD-10-CM | POA: Diagnosis not present

## 2022-08-03 DIAGNOSIS — G20A1 Parkinson's disease without dyskinesia, without mention of fluctuations: Secondary | ICD-10-CM | POA: Diagnosis not present

## 2022-08-03 DIAGNOSIS — M21619 Bunion of unspecified foot: Secondary | ICD-10-CM | POA: Diagnosis not present

## 2022-08-03 DIAGNOSIS — G620 Drug-induced polyneuropathy: Secondary | ICD-10-CM | POA: Diagnosis not present

## 2022-08-03 DIAGNOSIS — M8580 Other specified disorders of bone density and structure, unspecified site: Secondary | ICD-10-CM | POA: Diagnosis not present

## 2022-08-03 DIAGNOSIS — F411 Generalized anxiety disorder: Secondary | ICD-10-CM | POA: Diagnosis not present

## 2022-08-03 DIAGNOSIS — N8110 Cystocele, unspecified: Secondary | ICD-10-CM | POA: Diagnosis not present

## 2022-08-07 ENCOUNTER — Encounter: Payer: Self-pay | Admitting: Family Medicine

## 2022-08-09 ENCOUNTER — Other Ambulatory Visit: Payer: Self-pay | Admitting: Family

## 2022-08-09 DIAGNOSIS — M21619 Bunion of unspecified foot: Secondary | ICD-10-CM | POA: Diagnosis not present

## 2022-08-09 DIAGNOSIS — N8110 Cystocele, unspecified: Secondary | ICD-10-CM | POA: Diagnosis not present

## 2022-08-09 DIAGNOSIS — F411 Generalized anxiety disorder: Secondary | ICD-10-CM | POA: Diagnosis not present

## 2022-08-09 DIAGNOSIS — G620 Drug-induced polyneuropathy: Secondary | ICD-10-CM | POA: Diagnosis not present

## 2022-08-09 DIAGNOSIS — M8580 Other specified disorders of bone density and structure, unspecified site: Secondary | ICD-10-CM | POA: Diagnosis not present

## 2022-08-09 DIAGNOSIS — G20A1 Parkinson's disease without dyskinesia, without mention of fluctuations: Secondary | ICD-10-CM | POA: Diagnosis not present

## 2022-08-09 MED ORDER — OXYBUTYNIN CHLORIDE ER 5 MG PO TB24: 5.0000 mg | ORAL_TABLET | Freq: Every day | ORAL | 3 refills | Status: DC

## 2022-08-15 ENCOUNTER — Ambulatory Visit: Payer: Medicare Other | Admitting: Family Medicine

## 2022-09-16 ENCOUNTER — Ambulatory Visit: Payer: Medicare Other | Admitting: Family Medicine

## 2022-09-22 ENCOUNTER — Ambulatory Visit: Payer: Medicare Other | Admitting: Family Medicine

## 2022-09-26 ENCOUNTER — Encounter: Payer: Self-pay | Admitting: Family Medicine

## 2022-09-26 ENCOUNTER — Other Ambulatory Visit (HOSPITAL_BASED_OUTPATIENT_CLINIC_OR_DEPARTMENT_OTHER): Payer: Self-pay

## 2022-09-26 ENCOUNTER — Ambulatory Visit (INDEPENDENT_AMBULATORY_CARE_PROVIDER_SITE_OTHER): Payer: Medicare Other | Admitting: Family Medicine

## 2022-09-26 VITALS — BP 98/60 | HR 81 | Temp 98.1°F | Resp 18 | Ht 64.0 in

## 2022-09-26 DIAGNOSIS — E559 Vitamin D deficiency, unspecified: Secondary | ICD-10-CM

## 2022-09-26 DIAGNOSIS — G20B2 Parkinson's disease with dyskinesia, with fluctuations: Secondary | ICD-10-CM | POA: Diagnosis not present

## 2022-09-26 DIAGNOSIS — E538 Deficiency of other specified B group vitamins: Secondary | ICD-10-CM

## 2022-09-26 DIAGNOSIS — R32 Unspecified urinary incontinence: Secondary | ICD-10-CM | POA: Diagnosis not present

## 2022-09-26 DIAGNOSIS — R300 Dysuria: Secondary | ICD-10-CM | POA: Diagnosis not present

## 2022-09-26 LAB — VITAMIN B12: Vitamin B-12: 339 pg/mL (ref 211–911)

## 2022-09-26 LAB — CBC WITH DIFFERENTIAL/PLATELET
Basophils Absolute: 0 10*3/uL (ref 0.0–0.1)
Basophils Relative: 0.7 % (ref 0.0–3.0)
Eosinophils Absolute: 0.1 10*3/uL (ref 0.0–0.7)
Eosinophils Relative: 1.3 % (ref 0.0–5.0)
HCT: 36.2 % (ref 36.0–46.0)
Hemoglobin: 12 g/dL (ref 12.0–15.0)
Lymphocytes Relative: 17.2 % (ref 12.0–46.0)
Lymphs Abs: 0.9 10*3/uL (ref 0.7–4.0)
MCHC: 33.1 g/dL (ref 30.0–36.0)
MCV: 93.6 fl (ref 78.0–100.0)
Monocytes Absolute: 0.4 10*3/uL (ref 0.1–1.0)
Monocytes Relative: 7.1 % (ref 3.0–12.0)
Neutro Abs: 3.7 10*3/uL (ref 1.4–7.7)
Neutrophils Relative %: 73.7 % (ref 43.0–77.0)
Platelets: 223 10*3/uL (ref 150.0–400.0)
RBC: 3.86 Mil/uL — ABNORMAL LOW (ref 3.87–5.11)
RDW: 14 % (ref 11.5–15.5)
WBC: 5 10*3/uL (ref 4.0–10.5)

## 2022-09-26 LAB — VITAMIN D 25 HYDROXY (VIT D DEFICIENCY, FRACTURES): VITD: 31.4 ng/mL (ref 30.00–100.00)

## 2022-09-26 MED ORDER — AREXVY 120 MCG/0.5ML IM SUSR
INTRAMUSCULAR | 0 refills | Status: DC
  Filled 2022-09-26: qty 0.5, 1d supply, fill #0

## 2022-09-26 NOTE — Assessment & Plan Note (Signed)
Daughter and caretaker not sure if she is taking the ditropan regularly

## 2022-09-26 NOTE — Assessment & Plan Note (Signed)
Pt will get a sample at home and bring it in

## 2022-09-26 NOTE — Assessment & Plan Note (Signed)
Home health referral placed  F/u neuro

## 2022-09-26 NOTE — Progress Notes (Signed)
Subjective:   By signing my name below, I, Amanda [name], attest that this documentation has been prepared under the direction and in the presence of Amanda Held, DO 09/26/22   Patient ID: Amanda [name], female    DOB: [birth year], 77 y.o.   MRN: [id number]  Chief Complaint  Patient presents with   Follow-up    Pt requesting home health referral.   Dysuria    Pt states having burning with urination.     HPI Patient is in today for 3 month follow up. She is accompanied by her daughter and her caregiver, Amanda [name], for today's visit.  Patient was seen by NP Amanda [name] on 06/07/22 for left-sided rib pain. She had a left ribs imaging done that day which was negative for any findings. Her Urine culture was positive for Staphylococcus epidermidis so she was started on a 5 day course of Bactrim. She continued to have dysuria so a repeat culture was done which was negative. She was then started on Ditropan-XL '5mg'$  daily as her insurance would not cover Gemtesa.  Patient's caregiver states that the patient cannot always communicate early enough that she has to urinate so she often does not make it to the bathroom in time to urinate in the toilet. She has issues with nocturia as well. Patient continues to have some dysuria. Her caregiver is unsure if she is compliantly taking her Ditropan.   Her home health nurses have stopped going to her house so her daughter requested a renewal for her home health care. They have had difficulty making sure that the patient is taking all of her medications consistently. She has also had difficulty putting on her clothes or getting to the bathroom without assistance. Her caregiver states that she sometimes gets confused and will try to put her briefs on over her pajama pants after going to the bathroom.  Patient's daughter feels that her dementia symptoms have worsened. She has also had an increase in her uncontrolled movements today. Patient's daughter states  that her neurologist suggested that this is likely associated with the stress related to her urinary complaints and doctor's visits. She is followed by neurology Amanda [name] for her Parkinson's disease.  They have concerns regarding patient's history of vitamin B12 deficiency. 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   COLONOSCOPY  last one 2007   CYSTOCELE REPAIR N/A 01/11/2016   Procedure: ANTERIOR REPAIR (CYSTOCELE) COLOPLAST FASCIA  SACROSPINOUS FIXATION;  Surgeon: Amanda Clines, MD;  Location: [facility];  Service: Urology;  Laterality: N/A;   CYSTOSCOPY W/ URETERAL STENT PLACEMENT Bilateral 01/11/2016   Procedure: CYSTOSCOPY WITH BILATERAL RETROGRADE PYELOGRAM/URETERAL STENT PLACEMENT;  Surgeon: Amanda Clines, MD;  Location: [facility];  Service: Urology;  Laterality: Bilateral;   ESOPHAGOGASTRODUODENOSCOPY  2007   EXTERNAL EAR SURGERY Bilateral age 54  LAPAROSCOPIC OVARIAN CYSTECTOMY  March 2008   unilateral   MASTECTOMY Left 2018   PUBOVAGINAL SLING N/A 01/11/2016   Procedure:  ALTIS SLING SINGLE INCISION;  Surgeon: Amanda Clines, MD;  Location: [facility];  Service: Urology;  Laterality: N/A;   RECTOCELE REPAIR N/A 01/11/2016   Procedure: POSTERIOR REPAIR (RECTOCELE), PERINEAPLASTY;  Surgeon: Amanda Queen, MD;  Location: [place];  Service: Gynecology;  Laterality: N/A;   SIMPLE  MASTECTOMY WITH AXILLARY SENTINEL NODE BIOPSY Left 11/08/2018   Procedure: LEFT SIMPLE MASTECTOMY WITH LEFT  AXILLARY SENTINEL NODE MAPPING;  Surgeon: Amanda Luna, MD;  Location: [place];  Service: General;  Laterality: Left;   VAGINAL HYSTERECTOMY N/A 01/11/2016   Procedure: TOTAL VAGINAL HYSTERECTOMY ;  Surgeon: Amanda Queen, MD;  Location: [place];  Service: Gynecology;  Laterality: N/A;    Family History  Problem Relation Age of Onset   Heart disease Mother        enlarged heart   Cancer Father        pancreatic   COPD Sister    Pulmonary embolism Sister    Edema Sister        Ankles   Heart disease Brother    Polycystic kidney disease Son     Social History   Socioeconomic History   Marital status: Married    Spouse name: Amanda [name]   Number of children: 3   Years of education: College   Highest education level: Not on file  Occupational History   Occupation: retired  Tobacco Use   Smoking status: Never   Smokeless tobacco: Never  Vaping Use   Vaping Use: Never used  Substance and Sexual Activity   Alcohol use: No   Drug use: No   Sexual activity: Not on file  Other Topics Concern   Not on file  Social History Narrative   Pt lives at home with family.Caffeine Use: some, occasionally   Has Parkinson's   Social Determinants of Health   Financial Resource Strain: Low Risk  (06/05/2021)   Overall Financial Resource Strain (CARDIA)    Difficulty of Paying Living Expenses: Not hard at all  Food Insecurity: No Food Insecurity (06/05/2021)   Hunger Vital Sign    Worried About Running Out of Food in the Last Year: Never true    Ran Out of Food in the Last Year: Never true  Transportation Needs: No Transportation Needs (06/05/2021)   PRAPARE - Hydrologist (Medical): No    Lack of Transportation (Non-Medical): No  Physical Activity: Inactive (06/05/2021)   Exercise Vital Sign    Days of Exercise per Week: 0 days     Minutes of Exercise per Session: 0 min  Stress: Stress Concern Present (06/05/2021)   Imogene    Feeling of Stress : To some extent  Social Connections: Moderately Integrated (06/05/2021)   Social Connection and Isolation Panel [NHANES]    Frequency of Communication with Friends and Family: More than three times a week    Frequency of Social Gatherings with Friends and Family: Once a week    Attends Religious Services: 1 to 4 times per year    Active Member of [employer] or Organizations: No    Attends Archivist Meetings: Never    Marital Status: Married  Human resources officer Violence: Not At Risk (06/05/2021)   Humiliation, Afraid, Rape, and Kick questionnaire    Fear of Current  or Ex-Partner: No    Emotionally Abused: No    Physically Abused: No    Sexually Abused: No    Outpatient Medications Prior to Visit  Medication Sig Dispense Refill   aspirin EC 325 MG tablet Take 325 mg by mouth daily as needed for moderate pain.     b complex vitamins tablet Take 1 tablet by mouth daily. MEGA FOOD BALANCE SOURCE     carbidopa-levodopa (SINEMET IR) 25-100 MG tablet Take 1 tablet by mouth 2 (two) times daily.     Cholecalciferol (VITAMIN D3) 1000 UNIT/SPRAY LIQD Take 2 sprays by mouth 2 (two) times daily.     famotidine (PEPCID) 20 MG tablet TAKE 1 TABLET BY MOUTH TWICE A DAY 180 tablet 1   oxybutynin (DITROPAN-XL) 5 MG 24 hr tablet Take 1 tablet (5 mg total) by mouth at bedtime. 30 tablet 3   rivastigmine (EXELON) 4.6 mg/24hr APPLY 1 PATCH TOPICALLY ONCE DAILY     sertraline (ZOLOFT) 50 MG tablet Take 1 tablet (50 mg total) by mouth at bedtime. 90 tablet 3   letrozole (FEMARA) 2.5 MG tablet Take 1 tablet (2.5 mg total) by mouth daily. 90 tablet 2   sulfamethoxazole-trimethoprim (BACTRIM DS) 800-160 MG tablet Take 1 tablet by mouth 2 (two) times daily. 10 tablet 0   AMBULATORY NON FORMULARY MEDICATION Medication Name:  Compression Stockings 20-52mHg  1 pair (Patient not taking: Reported on 06/07/2022) 1 each 0   CALCIUM-MAGNESIUM-VITAMIN D PO Take 2 tablets by mouth 2 (two) times daily. (Patient not taking: Reported on 06/07/2022)     Probiotic Product (PROBIOTIC DAILY) CAPS Take 1 capsule by mouth daily as needed (indigestion).  (Patient not taking: Reported on 06/07/2022)     Propylene Glycol-Glycerin (SOOTHE OP) Place 2 drops into both eyes daily as needed (dry eyes). (Patient not taking: Reported on 06/07/2022)     No facility-administered medications prior to visit.    Allergies  Allergen Reactions   Penicillins     Leg pain Did it involve swelling of the face/tongue/throat, SOB, or low BP? No Did it involve sudden or severe rash/hives, skin peeling, or any reaction on the inside of your mouth or nose? No Did you need to seek medical attention at a hospital or doctor's office? No When did it last happen?      childhood allergy If all above answers are "NO", may proceed with cephalosporin use.      Review of Systems  Constitutional:  Negative for fever and malaise/fatigue.  HENT:  Negative for congestion, sinus pain and sore throat.   Eyes:  Negative for blurred vision.  Respiratory:  Negative for cough, shortness of breath and wheezing.   Cardiovascular:  Negative for chest pain, palpitations and leg swelling.  Gastrointestinal:  Negative for blood in stool, constipation, diarrhea, nausea and vomiting.  Genitourinary:  Positive for dysuria and urgency. Negative for frequency and hematuria.  Musculoskeletal:  Negative for back pain, joint pain and myalgias.  Skin:  Negative for rash.  Neurological:  Negative for loss of consciousness and headaches.  Psychiatric/Behavioral:  Positive for memory loss.        Objective:    Physical Exam Constitutional:      General: She is not in acute distress.    Appearance: Normal appearance. She is not ill-appearing.  HENT:     Head: Normocephalic  and atraumatic.     Right Ear: External ear normal.     Left Ear: External ear normal.  Eyes:  Extraocular Movements: Extraocular movements intact.     Pupils: Pupils are equal, round, and reactive to light.  Cardiovascular:     Rate and Rhythm: Normal rate and regular rhythm.     Heart sounds: Normal heart sounds. No murmur heard.    No gallop.  Pulmonary:     Effort: Pulmonary effort is normal. No respiratory distress.     Breath sounds: Normal breath sounds. No wheezing or rales.  Skin:    General: Skin is warm and dry.  Neurological:     Mental Status: She is alert and oriented to person, place, and time.     Comments: Severe gait abnormality  dyskinesia  Psychiatric:        Judgment: Judgment normal.     BP 98/60 (BP Location: Left Arm, Patient Position: Sitting, Cuff Size: Normal)   Pulse 81   Temp 98.1 F (36.7 C) (Oral)   Resp 18   Ht '5\' 4"'$  (1.626 m)   SpO2 96%   BMI 22.04 kg/m  Wt Readings from Last 3 Encounters:  06/07/22 128 lb 6.4 oz (58.2 kg)  05/12/22 126 lb 6.4 oz (57.3 kg)  02/21/22 126 lb 6.4 oz (57.3 kg)       Assessment & Plan:  Parkinson's disease with dyskinesia and fluctuating manifestations Assessment & Plan: Home health referral placed  F/u neuro   Orders: -     Ambulatory referral to [place] -     Vitamin B12 -     VITAMIN D 25 Hydroxy (Vit-D Deficiency, Fractures) -     CBC with Differential/Platelet  Dysuria Assessment & Plan: Pt will get a sample at home and bring it in   Orders: -     POCT Urinalysis Dipstick (Automated); Future  B12 deficiency -     Vitamin B12 -     VITAMIN D 25 Hydroxy (Vit-D Deficiency, Fractures) -     CBC with Differential/Platelet  Vitamin D deficiency -     VITAMIN D 25 Hydroxy (Vit-D Deficiency, Fractures)  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  All medical record entries made by the scribe were at my direction and in my presence.  I have reviewed the chart and discharge instructions (if applicable) and agree that the record reflects my personal performance and is accurate and complete. 09/26/22   Amanda Held, DO

## 2022-09-27 ENCOUNTER — Other Ambulatory Visit (INDEPENDENT_AMBULATORY_CARE_PROVIDER_SITE_OTHER): Payer: Medicare Other

## 2022-09-27 DIAGNOSIS — R300 Dysuria: Secondary | ICD-10-CM | POA: Diagnosis not present

## 2022-09-27 DIAGNOSIS — R82998 Other abnormal findings in urine: Secondary | ICD-10-CM | POA: Diagnosis not present

## 2022-09-27 LAB — POC URINALSYSI DIPSTICK (AUTOMATED)
Bilirubin, UA: NEGATIVE
Blood, UA: NEGATIVE
Glucose, UA: NEGATIVE
Ketones, UA: NEGATIVE
Nitrite, UA: NEGATIVE
Protein, UA: NEGATIVE
Spec Grav, UA: 1.01 (ref 1.010–1.025)
Urobilinogen, UA: 0.2 E.U./dL
pH, UA: 6.5 (ref 5.0–8.0)

## 2022-09-27 NOTE — Progress Notes (Signed)
No charge, re-collection.

## 2022-09-28 LAB — URINE CULTURE
MICRO NUMBER:: 14407834
Result:: NO GROWTH
SPECIMEN QUALITY:: ADEQUATE

## 2022-10-01 DIAGNOSIS — Z7982 Long term (current) use of aspirin: Secondary | ICD-10-CM | POA: Diagnosis not present

## 2022-10-01 DIAGNOSIS — G620 Drug-induced polyneuropathy: Secondary | ICD-10-CM | POA: Diagnosis not present

## 2022-10-01 DIAGNOSIS — E559 Vitamin D deficiency, unspecified: Secondary | ICD-10-CM | POA: Diagnosis not present

## 2022-10-01 DIAGNOSIS — N83209 Unspecified ovarian cyst, unspecified side: Secondary | ICD-10-CM | POA: Diagnosis not present

## 2022-10-01 DIAGNOSIS — J329 Chronic sinusitis, unspecified: Secondary | ICD-10-CM | POA: Diagnosis not present

## 2022-10-01 DIAGNOSIS — M810 Age-related osteoporosis without current pathological fracture: Secondary | ICD-10-CM | POA: Diagnosis not present

## 2022-10-01 DIAGNOSIS — M21619 Bunion of unspecified foot: Secondary | ICD-10-CM | POA: Diagnosis not present

## 2022-10-01 DIAGNOSIS — G20B2 Parkinson's disease with dyskinesia, with fluctuations: Secondary | ICD-10-CM | POA: Diagnosis not present

## 2022-10-01 DIAGNOSIS — R32 Unspecified urinary incontinence: Secondary | ICD-10-CM | POA: Diagnosis not present

## 2022-10-01 DIAGNOSIS — M8580 Other specified disorders of bone density and structure, unspecified site: Secondary | ICD-10-CM | POA: Diagnosis not present

## 2022-10-01 DIAGNOSIS — F411 Generalized anxiety disorder: Secondary | ICD-10-CM | POA: Diagnosis not present

## 2022-10-01 DIAGNOSIS — M2040 Other hammer toe(s) (acquired), unspecified foot: Secondary | ICD-10-CM | POA: Diagnosis not present

## 2022-10-01 DIAGNOSIS — C50512 Malignant neoplasm of lower-outer quadrant of left female breast: Secondary | ICD-10-CM | POA: Diagnosis not present

## 2022-10-01 DIAGNOSIS — R300 Dysuria: Secondary | ICD-10-CM | POA: Diagnosis not present

## 2022-10-01 DIAGNOSIS — E538 Deficiency of other specified B group vitamins: Secondary | ICD-10-CM | POA: Diagnosis not present

## 2022-10-01 DIAGNOSIS — L2082 Flexural eczema: Secondary | ICD-10-CM | POA: Diagnosis not present

## 2022-10-01 DIAGNOSIS — M7522 Bicipital tendinitis, left shoulder: Secondary | ICD-10-CM | POA: Diagnosis not present

## 2022-10-04 ENCOUNTER — Telehealth: Payer: Self-pay | Admitting: Family Medicine

## 2022-10-04 DIAGNOSIS — F411 Generalized anxiety disorder: Secondary | ICD-10-CM | POA: Diagnosis not present

## 2022-10-04 DIAGNOSIS — E559 Vitamin D deficiency, unspecified: Secondary | ICD-10-CM | POA: Diagnosis not present

## 2022-10-04 DIAGNOSIS — E538 Deficiency of other specified B group vitamins: Secondary | ICD-10-CM | POA: Diagnosis not present

## 2022-10-04 DIAGNOSIS — G20B2 Parkinson's disease with dyskinesia, with fluctuations: Secondary | ICD-10-CM | POA: Diagnosis not present

## 2022-10-04 DIAGNOSIS — R32 Unspecified urinary incontinence: Secondary | ICD-10-CM | POA: Diagnosis not present

## 2022-10-04 DIAGNOSIS — R300 Dysuria: Secondary | ICD-10-CM | POA: Diagnosis not present

## 2022-10-04 NOTE — Telephone Encounter (Signed)
Caller/Agency: [facility]  Callback Number: [phone] Requesting OT/PT/Skilled Nursing/Social Work/Speech Therapy: nursing Frequency: 1x1, 2x1, 1x2, 2 PRN  Nurse wanted to update Dr. Etter Sjogren and let her know that the patient has a lack of a caregiver who is over seeing all medical needs for patient. RN states it is interrupting the care the patient needs. She stated the husband was doing the medication but he is having memory problems and cannot longer care for pt. Patient has a daughter who cannot come to help her mom because the son lives with the patient and the daughter is scared of the son. Husband has paid caregiver but they are not allowed to do the patient's medications.

## 2022-10-05 ENCOUNTER — Telehealth: Payer: Self-pay | Admitting: Family Medicine

## 2022-10-05 DIAGNOSIS — E538 Deficiency of other specified B group vitamins: Secondary | ICD-10-CM | POA: Diagnosis not present

## 2022-10-05 DIAGNOSIS — F411 Generalized anxiety disorder: Secondary | ICD-10-CM | POA: Diagnosis not present

## 2022-10-05 DIAGNOSIS — G20B2 Parkinson's disease with dyskinesia, with fluctuations: Secondary | ICD-10-CM | POA: Diagnosis not present

## 2022-10-05 DIAGNOSIS — E559 Vitamin D deficiency, unspecified: Secondary | ICD-10-CM | POA: Diagnosis not present

## 2022-10-05 DIAGNOSIS — R300 Dysuria: Secondary | ICD-10-CM | POA: Diagnosis not present

## 2022-10-05 DIAGNOSIS — R32 Unspecified urinary incontinence: Secondary | ICD-10-CM | POA: Diagnosis not present

## 2022-10-05 NOTE — Telephone Encounter (Signed)
FYIElmer Picker, Nurse with [facility] called to advise that patient fell on Sunday but does not appear to have injuries and they didn't have to call anyone. She just needed to let her PCP know.

## 2022-10-05 NOTE — Telephone Encounter (Signed)
Verbal given. Spoke with nurse. She doesn't believe the situation is at the point of needing APS and she doesn't think could do anything at this point. She states at this time the patient's needs are being met but will let us know if anything changes.

## 2022-10-07 DIAGNOSIS — F411 Generalized anxiety disorder: Secondary | ICD-10-CM | POA: Diagnosis not present

## 2022-10-07 DIAGNOSIS — R300 Dysuria: Secondary | ICD-10-CM | POA: Diagnosis not present

## 2022-10-07 DIAGNOSIS — G20B2 Parkinson's disease with dyskinesia, with fluctuations: Secondary | ICD-10-CM | POA: Diagnosis not present

## 2022-10-07 DIAGNOSIS — R32 Unspecified urinary incontinence: Secondary | ICD-10-CM | POA: Diagnosis not present

## 2022-10-07 DIAGNOSIS — E559 Vitamin D deficiency, unspecified: Secondary | ICD-10-CM | POA: Diagnosis not present

## 2022-10-07 DIAGNOSIS — E538 Deficiency of other specified B group vitamins: Secondary | ICD-10-CM | POA: Diagnosis not present

## 2022-10-10 DIAGNOSIS — E559 Vitamin D deficiency, unspecified: Secondary | ICD-10-CM | POA: Diagnosis not present

## 2022-10-10 DIAGNOSIS — E538 Deficiency of other specified B group vitamins: Secondary | ICD-10-CM | POA: Diagnosis not present

## 2022-10-10 DIAGNOSIS — R32 Unspecified urinary incontinence: Secondary | ICD-10-CM | POA: Diagnosis not present

## 2022-10-10 DIAGNOSIS — F411 Generalized anxiety disorder: Secondary | ICD-10-CM | POA: Diagnosis not present

## 2022-10-10 DIAGNOSIS — G20B2 Parkinson's disease with dyskinesia, with fluctuations: Secondary | ICD-10-CM | POA: Diagnosis not present

## 2022-10-10 DIAGNOSIS — R300 Dysuria: Secondary | ICD-10-CM | POA: Diagnosis not present

## 2022-10-12 ENCOUNTER — Other Ambulatory Visit: Payer: Self-pay | Admitting: Hematology and Oncology

## 2022-10-13 DIAGNOSIS — E559 Vitamin D deficiency, unspecified: Secondary | ICD-10-CM | POA: Diagnosis not present

## 2022-10-13 DIAGNOSIS — R300 Dysuria: Secondary | ICD-10-CM | POA: Diagnosis not present

## 2022-10-13 DIAGNOSIS — E538 Deficiency of other specified B group vitamins: Secondary | ICD-10-CM | POA: Diagnosis not present

## 2022-10-13 DIAGNOSIS — R32 Unspecified urinary incontinence: Secondary | ICD-10-CM | POA: Diagnosis not present

## 2022-10-13 DIAGNOSIS — F411 Generalized anxiety disorder: Secondary | ICD-10-CM | POA: Diagnosis not present

## 2022-10-13 DIAGNOSIS — G20B2 Parkinson's disease with dyskinesia, with fluctuations: Secondary | ICD-10-CM | POA: Diagnosis not present

## 2022-10-14 DIAGNOSIS — R300 Dysuria: Secondary | ICD-10-CM | POA: Diagnosis not present

## 2022-10-14 DIAGNOSIS — E559 Vitamin D deficiency, unspecified: Secondary | ICD-10-CM | POA: Diagnosis not present

## 2022-10-14 DIAGNOSIS — R32 Unspecified urinary incontinence: Secondary | ICD-10-CM | POA: Diagnosis not present

## 2022-10-14 DIAGNOSIS — E538 Deficiency of other specified B group vitamins: Secondary | ICD-10-CM | POA: Diagnosis not present

## 2022-10-14 DIAGNOSIS — F411 Generalized anxiety disorder: Secondary | ICD-10-CM | POA: Diagnosis not present

## 2022-10-14 DIAGNOSIS — G20B2 Parkinson's disease with dyskinesia, with fluctuations: Secondary | ICD-10-CM | POA: Diagnosis not present

## 2022-10-18 ENCOUNTER — Encounter: Payer: Self-pay | Admitting: Family Medicine

## 2022-10-18 DIAGNOSIS — R32 Unspecified urinary incontinence: Secondary | ICD-10-CM | POA: Diagnosis not present

## 2022-10-18 DIAGNOSIS — E559 Vitamin D deficiency, unspecified: Secondary | ICD-10-CM | POA: Diagnosis not present

## 2022-10-18 DIAGNOSIS — E538 Deficiency of other specified B group vitamins: Secondary | ICD-10-CM | POA: Diagnosis not present

## 2022-10-18 DIAGNOSIS — F411 Generalized anxiety disorder: Secondary | ICD-10-CM | POA: Diagnosis not present

## 2022-10-18 DIAGNOSIS — G20B2 Parkinson's disease with dyskinesia, with fluctuations: Secondary | ICD-10-CM | POA: Diagnosis not present

## 2022-10-18 DIAGNOSIS — R300 Dysuria: Secondary | ICD-10-CM | POA: Diagnosis not present

## 2022-10-20 DIAGNOSIS — E559 Vitamin D deficiency, unspecified: Secondary | ICD-10-CM | POA: Diagnosis not present

## 2022-10-20 DIAGNOSIS — R300 Dysuria: Secondary | ICD-10-CM | POA: Diagnosis not present

## 2022-10-20 DIAGNOSIS — G20B2 Parkinson's disease with dyskinesia, with fluctuations: Secondary | ICD-10-CM | POA: Diagnosis not present

## 2022-10-20 DIAGNOSIS — E538 Deficiency of other specified B group vitamins: Secondary | ICD-10-CM | POA: Diagnosis not present

## 2022-10-20 DIAGNOSIS — F411 Generalized anxiety disorder: Secondary | ICD-10-CM | POA: Diagnosis not present

## 2022-10-20 DIAGNOSIS — R32 Unspecified urinary incontinence: Secondary | ICD-10-CM | POA: Diagnosis not present

## 2022-10-24 DIAGNOSIS — G20B1 Parkinson's disease with dyskinesia, without mention of fluctuations: Secondary | ICD-10-CM | POA: Diagnosis not present

## 2022-10-24 DIAGNOSIS — Z880 Allergy status to penicillin: Secondary | ICD-10-CM | POA: Diagnosis not present

## 2022-10-24 DIAGNOSIS — G479 Sleep disorder, unspecified: Secondary | ICD-10-CM | POA: Diagnosis not present

## 2022-10-24 DIAGNOSIS — G20A1 Parkinson's disease without dyskinesia, without mention of fluctuations: Secondary | ICD-10-CM | POA: Diagnosis not present

## 2022-10-24 DIAGNOSIS — F0284 Dementia in other diseases classified elsewhere, unspecified severity, with anxiety: Secondary | ICD-10-CM | POA: Diagnosis not present

## 2022-10-24 DIAGNOSIS — F419 Anxiety disorder, unspecified: Secondary | ICD-10-CM | POA: Diagnosis not present

## 2022-10-24 DIAGNOSIS — F02A4 Dementia in other diseases classified elsewhere, mild, with anxiety: Secondary | ICD-10-CM | POA: Diagnosis not present

## 2022-10-25 ENCOUNTER — Telehealth: Payer: Self-pay | Admitting: Family Medicine

## 2022-10-25 DIAGNOSIS — R32 Unspecified urinary incontinence: Secondary | ICD-10-CM | POA: Diagnosis not present

## 2022-10-25 DIAGNOSIS — E538 Deficiency of other specified B group vitamins: Secondary | ICD-10-CM | POA: Diagnosis not present

## 2022-10-25 DIAGNOSIS — F411 Generalized anxiety disorder: Secondary | ICD-10-CM | POA: Diagnosis not present

## 2022-10-25 DIAGNOSIS — G20B2 Parkinson's disease with dyskinesia, with fluctuations: Secondary | ICD-10-CM | POA: Diagnosis not present

## 2022-10-25 DIAGNOSIS — R300 Dysuria: Secondary | ICD-10-CM | POA: Diagnosis not present

## 2022-10-25 DIAGNOSIS — E559 Vitamin D deficiency, unspecified: Secondary | ICD-10-CM | POA: Diagnosis not present

## 2022-10-25 NOTE — Telephone Encounter (Signed)
[facility]) called to report a pt fall on 2.4.24 in the morning:  Pt did hit her head with no wound or brusing but did report having a headache and some dizziness earlier. Pt took two aspirin which helped ease the headache and saw the neurologist on 2.5.24.

## 2022-10-26 ENCOUNTER — Telehealth: Payer: Self-pay | Admitting: *Deleted

## 2022-10-26 NOTE — Telephone Encounter (Signed)
Caregiver presented to the office this afternoon with a urine sample for pt. She states she had told pt's daughter that pt's urine has started to have a strong odor to it. Daughter told caregiver to go ahead and collect a sample and daughter would call our office. Caregiver thought daughter had already called [place] but I do not see any documentation regarding urine symptoms in chart. Spoke with doc of the day and was advised pt would need an office visit. Caregiver reports it is very hard to get urine sample from pt and wanted to know if we could still use the urine she brought with her. I advised her we would need a fresh specimen. She states she will try her best to get a specimen before pt's appt but pt has dementia and is having more difficulty knowing when she needs to urinate. I have placed specimen from today in our fridge in the event pt/caregiver is not able to get a fresh specimen prior to visit tomorrow afternoon. Caregiver schedule ov with Caleen Jobs, NP tomorrow at 2:40pm.

## 2022-10-27 ENCOUNTER — Ambulatory Visit (HOSPITAL_BASED_OUTPATIENT_CLINIC_OR_DEPARTMENT_OTHER)
Admission: RE | Admit: 2022-10-27 | Discharge: 2022-10-27 | Disposition: A | Discharge status: Home / Self Care | Payer: Medicare Other | Source: Ambulatory Visit | Attending: Family Medicine | Admitting: Family Medicine

## 2022-10-27 ENCOUNTER — Encounter: Payer: Self-pay | Admitting: Family Medicine

## 2022-10-27 ENCOUNTER — Ambulatory Visit (INDEPENDENT_AMBULATORY_CARE_PROVIDER_SITE_OTHER): Payer: Medicare Other | Admitting: Family Medicine

## 2022-10-27 VITALS — BP 104/45 | HR 74 | Temp 98.1°F | Resp 16 | Ht 64.0 in | Wt 129.0 lb

## 2022-10-27 DIAGNOSIS — R32 Unspecified urinary incontinence: Secondary | ICD-10-CM | POA: Diagnosis not present

## 2022-10-27 DIAGNOSIS — R8290 Unspecified abnormal findings in urine: Secondary | ICD-10-CM | POA: Diagnosis not present

## 2022-10-27 DIAGNOSIS — G44319 Acute post-traumatic headache, not intractable: Secondary | ICD-10-CM | POA: Insufficient documentation

## 2022-10-27 DIAGNOSIS — R519 Headache, unspecified: Secondary | ICD-10-CM | POA: Diagnosis not present

## 2022-10-27 LAB — POCT URINALYSIS DIPSTICK
Bilirubin, UA: NEGATIVE
Blood, UA: NEGATIVE
Glucose, UA: NEGATIVE
Ketones, UA: NEGATIVE
Nitrite, UA: NEGATIVE
Protein, UA: NEGATIVE
Spec Grav, UA: 1.015 (ref 1.010–1.025)
Urobilinogen, UA: 0.2 E.U./dL
pH, UA: 6 (ref 5.0–8.0)

## 2022-10-27 MED ORDER — MIRABEGRON ER 25 MG PO TB24: 25.0000 mg | ORAL_TABLET | Freq: Every day | ORAL | 1 refills | Status: DC

## 2022-10-27 NOTE — Progress Notes (Signed)
Acute Office Visit  Subjective:     Patient ID: Amanda [name], female    DOB: 12/06/1945, 77 y.o.   MRN: [id number]  Chief Complaint  Patient presents with   Fall    Headache from this    Patient is in today for urine odor and headache.  Patient is here from home with husband and caregiver. Caregiver reports Amanda [name] has had foul smelling urine for the past few days. She has not complained of any pain. She has had some urgency/incontinent which has been going on for awhile - PCP had prescribed oxybutynin, but at their neuro appointment a few days ago, it was recommended she switch to Myrbetric. She denies any abdominal/flank pain, hematuria, frequency, fevers.  Additionally, caregiver and husband report that she started complaining of a headache on Tuesday (2 days ago) and again during the night last night (up to 8/10 ache to top of head). She reports that she fell under the table on Sunday and hit her head. She didn't initially mention the headache to anyone until she noticed it on Tuesday. She has been at baseline cognition (dementia), no slurred speech, asymmetrical weakness, vision changes, etc. She got some improvement from Tylenol.    ROS All review of systems negative except what is listed in the HPI      Objective:    BP (!) 104/45   Pulse 74   Temp 98.1 F (36.7 C)   Resp 16   Ht '5\' 4"'$  (1.626 m)   Wt 129 lb (58.5 kg)   SpO2 95%   BMI 22.14 kg/m    Physical Exam Vitals reviewed.  Constitutional:      General: She is not in acute distress.    Appearance: Normal appearance. She is not ill-appearing.  Cardiovascular:     Rate and Rhythm: Normal rate and regular rhythm.  Pulmonary:     Effort: Pulmonary effort is normal.     Breath sounds: Normal breath sounds.  Abdominal:     General: Bowel sounds are normal. There is no distension.     Palpations: Abdomen is soft.     Tenderness: There is no abdominal tenderness. There is no guarding.  Neurological:      General: No focal deficit present.     Mental Status: She is alert. Mental status is at baseline.     Comments: Baseline, dementia  Psychiatric:        Mood and Affect: Mood normal.        Behavior: Behavior normal.        Thought Content: Thought content normal.        Judgment: Judgment normal.     Results for orders placed or performed in visit on 10/27/22  POC Urinalysis Dipstick  Result Value Ref Range   Color, UA Yellow    Clarity, UA Cloudy    Glucose, UA Negative Negative   Bilirubin, UA Negative    Ketones, UA Negative    Spec Grav, UA 1.015 1.010 - 1.025   Blood, UA Negative    pH, UA 6.0 5.0 - 8.0   Protein, UA Negative Negative   Urobilinogen, UA 0.2 0.2 or 1.0 E.U./dL   Nitrite, UA Negative    Leukocytes, UA Trace (A) Negative   Appearance     Odor          Assessment & Plan:   Problem List Items Addressed This Visit       Other   Incontinence in female  Changing to Myrbetriq trial   Relevant Medications   mirabegron ER (MYRBETRIQ) 25 MG TB24 tablet   Other Relevant Orders   POC Urinalysis Dipstick (Completed)   Urine Culture   Other Visit Diagnoses     Acute post-traumatic headache, not intractable    -  Primary No new neuro findings. Appears to be at baseline other than occasional headaches. CT scan ordered to confirm no acute concerns.    Relevant Orders   CT HEAD WO CONTRAST (5MM) (Completed)   Basic Metabolic Panel (BMET)   CBC w/Diff   Malodorous urine   UA with trace leuks - sending for culture Will update patient/family with results Patient aware of signs/symptoms requiring further/urgent evaluation.     Relevant Orders   Basic Metabolic Panel (BMET)   CBC w/Diff   POC Urinalysis Dipstick (Completed)   Urine Culture       Meds ordered this encounter  Medications   mirabegron ER (MYRBETRIQ) 25 MG TB24 tablet    Sig: Take 1 tablet (25 mg total) by mouth daily.    Dispense:  30 tablet    Refill:  1    Order Specific  Question:   Supervising Provider    Answer:   Penni Homans A [[id number]]    Return if symptoms worsen or fail to improve.  Terrilyn Saver, NP

## 2022-10-27 NOTE — Patient Instructions (Addendum)
Urine with trace amount of white blood cells - we will culture to see if there is a true infection Changing your oxybutynin to Myrbetriq (this can be expensive so we will see if it will get covered or not) Adding head CT for headaches after fall We will let you know results and plan.

## 2022-10-28 LAB — BASIC METABOLIC PANEL
BUN: 18 mg/dL (ref 6–23)
CO2: 28 mEq/L (ref 19–32)
Calcium: 9.3 mg/dL (ref 8.4–10.5)
Chloride: 104 mEq/L (ref 96–112)
Creatinine, Ser: 1.07 mg/dL (ref 0.40–1.20)
GFR: 50.84 mL/min — ABNORMAL LOW (ref >60.00)
Glucose, Bld: 120 mg/dL — ABNORMAL HIGH (ref 70–99)
Potassium: 4 mEq/L (ref 3.5–5.1)
Sodium: 143 mEq/L (ref 135–145)

## 2022-10-28 LAB — CBC WITH DIFFERENTIAL/PLATELET
Basophils Absolute: 0 10*3/uL (ref 0.0–0.1)
Basophils Relative: 0.8 % (ref 0.0–3.0)
Eosinophils Absolute: 0.1 10*3/uL (ref 0.0–0.7)
Eosinophils Relative: 2.5 % (ref 0.0–5.0)
HCT: 36.7 % (ref 36.0–46.0)
Hemoglobin: 12.4 g/dL (ref 12.0–15.0)
Lymphocytes Relative: 21.4 % (ref 12.0–46.0)
Lymphs Abs: 1 10*3/uL (ref 0.7–4.0)
MCHC: 33.9 g/dL (ref 30.0–36.0)
MCV: 93.1 fl (ref 78.0–100.0)
Monocytes Absolute: 0.4 10*3/uL (ref 0.1–1.0)
Monocytes Relative: 7.5 % (ref 3.0–12.0)
Neutro Abs: 3.3 10*3/uL (ref 1.4–7.7)
Neutrophils Relative %: 67.8 % (ref 43.0–77.0)
Platelets: 218 10*3/uL (ref 150.0–400.0)
RBC: 3.94 Mil/uL (ref 3.87–5.11)
RDW: 13.8 % (ref 11.5–15.5)
WBC: 4.9 10*3/uL (ref 4.0–10.5)

## 2022-10-31 DIAGNOSIS — M8580 Other specified disorders of bone density and structure, unspecified site: Secondary | ICD-10-CM | POA: Diagnosis not present

## 2022-10-31 DIAGNOSIS — M21619 Bunion of unspecified foot: Secondary | ICD-10-CM | POA: Diagnosis not present

## 2022-10-31 DIAGNOSIS — C50512 Malignant neoplasm of lower-outer quadrant of left female breast: Secondary | ICD-10-CM | POA: Diagnosis not present

## 2022-10-31 DIAGNOSIS — R300 Dysuria: Secondary | ICD-10-CM | POA: Diagnosis not present

## 2022-10-31 DIAGNOSIS — E538 Deficiency of other specified B group vitamins: Secondary | ICD-10-CM | POA: Diagnosis not present

## 2022-10-31 DIAGNOSIS — N83209 Unspecified ovarian cyst, unspecified side: Secondary | ICD-10-CM | POA: Diagnosis not present

## 2022-10-31 DIAGNOSIS — L2082 Flexural eczema: Secondary | ICD-10-CM | POA: Diagnosis not present

## 2022-10-31 DIAGNOSIS — E559 Vitamin D deficiency, unspecified: Secondary | ICD-10-CM | POA: Diagnosis not present

## 2022-10-31 DIAGNOSIS — R32 Unspecified urinary incontinence: Secondary | ICD-10-CM | POA: Diagnosis not present

## 2022-10-31 DIAGNOSIS — G620 Drug-induced polyneuropathy: Secondary | ICD-10-CM | POA: Diagnosis not present

## 2022-10-31 DIAGNOSIS — M810 Age-related osteoporosis without current pathological fracture: Secondary | ICD-10-CM | POA: Diagnosis not present

## 2022-10-31 DIAGNOSIS — M7522 Bicipital tendinitis, left shoulder: Secondary | ICD-10-CM | POA: Diagnosis not present

## 2022-10-31 DIAGNOSIS — J329 Chronic sinusitis, unspecified: Secondary | ICD-10-CM | POA: Diagnosis not present

## 2022-10-31 DIAGNOSIS — Z7982 Long term (current) use of aspirin: Secondary | ICD-10-CM | POA: Diagnosis not present

## 2022-10-31 DIAGNOSIS — M2040 Other hammer toe(s) (acquired), unspecified foot: Secondary | ICD-10-CM | POA: Diagnosis not present

## 2022-10-31 DIAGNOSIS — G20B2 Parkinson's disease with dyskinesia, with fluctuations: Secondary | ICD-10-CM | POA: Diagnosis not present

## 2022-10-31 DIAGNOSIS — F411 Generalized anxiety disorder: Secondary | ICD-10-CM | POA: Diagnosis not present

## 2022-10-31 LAB — URINE CULTURE
MICRO NUMBER:: 14538953
SPECIMEN QUALITY:: ADEQUATE

## 2022-11-02 DIAGNOSIS — R32 Unspecified urinary incontinence: Secondary | ICD-10-CM | POA: Diagnosis not present

## 2022-11-02 DIAGNOSIS — G20B2 Parkinson's disease with dyskinesia, with fluctuations: Secondary | ICD-10-CM | POA: Diagnosis not present

## 2022-11-02 DIAGNOSIS — F411 Generalized anxiety disorder: Secondary | ICD-10-CM | POA: Diagnosis not present

## 2022-11-02 DIAGNOSIS — R300 Dysuria: Secondary | ICD-10-CM | POA: Diagnosis not present

## 2022-11-02 DIAGNOSIS — E559 Vitamin D deficiency, unspecified: Secondary | ICD-10-CM | POA: Diagnosis not present

## 2022-11-02 DIAGNOSIS — E538 Deficiency of other specified B group vitamins: Secondary | ICD-10-CM | POA: Diagnosis not present

## 2022-11-03 DIAGNOSIS — R300 Dysuria: Secondary | ICD-10-CM | POA: Diagnosis not present

## 2022-11-03 DIAGNOSIS — E538 Deficiency of other specified B group vitamins: Secondary | ICD-10-CM | POA: Diagnosis not present

## 2022-11-03 DIAGNOSIS — F411 Generalized anxiety disorder: Secondary | ICD-10-CM | POA: Diagnosis not present

## 2022-11-03 DIAGNOSIS — E559 Vitamin D deficiency, unspecified: Secondary | ICD-10-CM | POA: Diagnosis not present

## 2022-11-03 DIAGNOSIS — R32 Unspecified urinary incontinence: Secondary | ICD-10-CM | POA: Diagnosis not present

## 2022-11-03 DIAGNOSIS — G20B2 Parkinson's disease with dyskinesia, with fluctuations: Secondary | ICD-10-CM | POA: Diagnosis not present

## 2022-11-07 DIAGNOSIS — E559 Vitamin D deficiency, unspecified: Secondary | ICD-10-CM | POA: Diagnosis not present

## 2022-11-07 DIAGNOSIS — F411 Generalized anxiety disorder: Secondary | ICD-10-CM | POA: Diagnosis not present

## 2022-11-07 DIAGNOSIS — E538 Deficiency of other specified B group vitamins: Secondary | ICD-10-CM | POA: Diagnosis not present

## 2022-11-07 DIAGNOSIS — G20B2 Parkinson's disease with dyskinesia, with fluctuations: Secondary | ICD-10-CM | POA: Diagnosis not present

## 2022-11-07 DIAGNOSIS — R32 Unspecified urinary incontinence: Secondary | ICD-10-CM | POA: Diagnosis not present

## 2022-11-07 DIAGNOSIS — R300 Dysuria: Secondary | ICD-10-CM | POA: Diagnosis not present

## 2022-11-09 ENCOUNTER — Other Ambulatory Visit: Payer: Self-pay | Admitting: Family

## 2022-11-10 DIAGNOSIS — F411 Generalized anxiety disorder: Secondary | ICD-10-CM | POA: Diagnosis not present

## 2022-11-10 DIAGNOSIS — R32 Unspecified urinary incontinence: Secondary | ICD-10-CM | POA: Diagnosis not present

## 2022-11-10 DIAGNOSIS — E538 Deficiency of other specified B group vitamins: Secondary | ICD-10-CM | POA: Diagnosis not present

## 2022-11-10 DIAGNOSIS — R300 Dysuria: Secondary | ICD-10-CM | POA: Diagnosis not present

## 2022-11-10 DIAGNOSIS — E559 Vitamin D deficiency, unspecified: Secondary | ICD-10-CM | POA: Diagnosis not present

## 2022-11-10 DIAGNOSIS — G20B2 Parkinson's disease with dyskinesia, with fluctuations: Secondary | ICD-10-CM | POA: Diagnosis not present

## 2022-11-18 DIAGNOSIS — G20B2 Parkinson's disease with dyskinesia, with fluctuations: Secondary | ICD-10-CM | POA: Diagnosis not present

## 2022-11-18 DIAGNOSIS — E559 Vitamin D deficiency, unspecified: Secondary | ICD-10-CM | POA: Diagnosis not present

## 2022-11-18 DIAGNOSIS — R32 Unspecified urinary incontinence: Secondary | ICD-10-CM | POA: Diagnosis not present

## 2022-11-18 DIAGNOSIS — E538 Deficiency of other specified B group vitamins: Secondary | ICD-10-CM | POA: Diagnosis not present

## 2022-11-18 DIAGNOSIS — R300 Dysuria: Secondary | ICD-10-CM | POA: Diagnosis not present

## 2022-11-18 DIAGNOSIS — F411 Generalized anxiety disorder: Secondary | ICD-10-CM | POA: Diagnosis not present

## 2022-11-24 DIAGNOSIS — M810 Age-related osteoporosis without current pathological fracture: Secondary | ICD-10-CM

## 2022-11-24 DIAGNOSIS — G620 Drug-induced polyneuropathy: Secondary | ICD-10-CM

## 2022-11-24 DIAGNOSIS — R32 Unspecified urinary incontinence: Secondary | ICD-10-CM | POA: Diagnosis not present

## 2022-11-24 DIAGNOSIS — M2040 Other hammer toe(s) (acquired), unspecified foot: Secondary | ICD-10-CM

## 2022-11-24 DIAGNOSIS — L2082 Flexural eczema: Secondary | ICD-10-CM

## 2022-11-24 DIAGNOSIS — Z7982 Long term (current) use of aspirin: Secondary | ICD-10-CM

## 2022-11-24 DIAGNOSIS — M7522 Bicipital tendinitis, left shoulder: Secondary | ICD-10-CM

## 2022-11-24 DIAGNOSIS — M8580 Other specified disorders of bone density and structure, unspecified site: Secondary | ICD-10-CM

## 2022-11-24 DIAGNOSIS — J329 Chronic sinusitis, unspecified: Secondary | ICD-10-CM

## 2022-11-24 DIAGNOSIS — M21619 Bunion of unspecified foot: Secondary | ICD-10-CM

## 2022-11-24 DIAGNOSIS — E538 Deficiency of other specified B group vitamins: Secondary | ICD-10-CM | POA: Diagnosis not present

## 2022-11-24 DIAGNOSIS — G20B2 Parkinson's disease with dyskinesia, with fluctuations: Secondary | ICD-10-CM | POA: Diagnosis not present

## 2022-11-24 DIAGNOSIS — F411 Generalized anxiety disorder: Secondary | ICD-10-CM

## 2022-11-24 DIAGNOSIS — E559 Vitamin D deficiency, unspecified: Secondary | ICD-10-CM | POA: Diagnosis not present

## 2022-11-24 DIAGNOSIS — R300 Dysuria: Secondary | ICD-10-CM

## 2022-11-24 DIAGNOSIS — C50512 Malignant neoplasm of lower-outer quadrant of left female breast: Secondary | ICD-10-CM

## 2022-11-24 DIAGNOSIS — N83209 Unspecified ovarian cyst, unspecified side: Secondary | ICD-10-CM

## 2022-12-22 ENCOUNTER — Telehealth: Payer: Self-pay | Admitting: Family Medicine

## 2022-12-22 NOTE — Telephone Encounter (Signed)
Contacted [name] to schedule their annual wellness visit. Appointment made for 01/05/2023.  Sherol Dade; Care Guide Amb[facility] Direct Dial: [phone]

## 2023-01-05 ENCOUNTER — Ambulatory Visit (INDEPENDENT_AMBULATORY_CARE_PROVIDER_SITE_OTHER): Payer: Medicare Other | Admitting: *Deleted

## 2023-01-05 DIAGNOSIS — Z0000 Encounter for general adult medical examination without abnormal findings: Secondary | ICD-10-CM

## 2023-01-05 NOTE — Progress Notes (Signed)
Subjective:   Amanda [name] is a 77 y.o. female who presents for Medicare Annual (Subsequent) preventive examination.  I connected with  Amanda [name] on 01/05/23 by a audio enabled telemedicine application and verified that I am speaking with the correct person using two identifiers.  Patient Location: Home  Provider Location: Office/Clinic  I discussed the limitations of evaluation and management by telemedicine. The patient expressed understanding and agreed to proceed.   Review of Systems     Cardiac Risk Factors include: advanced age (>47men, >59 women)     Objective:    There were no vitals filed for this visit. There is no height or weight on file to calculate BMI.     01/05/2023   11:02 AM 06/05/2021    1:41 PM 05/14/2019    1:11 PM 11/27/2018   12:44 PM 10/31/2018    2:51 PM 05/03/2018   11:40 AM 08/15/2017    1:06 PM  Advanced Directives  Does Patient Have a Medical Advance Directive? Yes Yes Yes Yes No Yes Yes  Type of Estate agent of [place];Living will Healthcare Power of [place];Living will Healthcare Power of [place];Living will Healthcare Power of [place];Living will  Healthcare Power of [place];Living will Living will;Healthcare Power of Attorney  Does patient want to make changes to medical advance directive?   No - Patient declined      Copy of Healthcare Power of Attorney in Chart? No - copy requested No - copy requested No - copy requested No - copy requested  No - copy requested No - copy requested  Would patient like information on creating a medical advance directive?     No - Patient declined      Current Medications (verified) Outpatient Encounter Medications as of 01/05/2023  Medication Sig   AMBULATORY NON FORMULARY MEDICATION Medication Name: Compression Stockings 20-73mmHg  1 pair   aspirin EC 325 MG tablet Take 325 mg by mouth daily as needed for moderate pain.   b complex vitamins tablet Take 1 tablet by mouth  daily. MEGA FOOD BALANCE SOURCE   CALCIUM-MAGNESIUM-VITAMIN D PO Take 2 tablets by mouth 2 (two) times daily.   carbidopa-levodopa (SINEMET IR) 25-100 MG tablet Take 1 tablet by mouth 3 (three) times daily. 1 whole in morning and 1/2 tablet every 3 hours   Cholecalciferol (VITAMIN D3) 1000 UNIT/SPRAY LIQD Take 2 sprays by mouth 2 (two) times daily.   famotidine (PEPCID) 20 MG tablet TAKE 1 TABLET BY MOUTH TWICE A DAY   mirabegron ER (MYRBETRIQ) 25 MG TB24 tablet Take 1 tablet (25 mg total) by mouth daily.   Probiotic Product (PROBIOTIC DAILY) CAPS Take 1 capsule by mouth daily as needed (indigestion).   Propylene Glycol-Glycerin (SOOTHE OP) Place 2 drops into both eyes daily as needed (dry eyes).   rivastigmine (EXELON) 4.6 mg/24hr APPLY 1 PATCH TOPICALLY ONCE DAILY   RSV vaccine recomb adjuvanted (AREXVY) 120 MCG/0.5ML injection Inject into the muscle.   sertraline (ZOLOFT) 50 MG tablet Take 1 tablet (50 mg total) by mouth at bedtime.   No facility-administered encounter medications on file as of 01/05/2023.    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   COLONOSCOPY  last one 2007   CYSTOCELE REPAIR N/A 01/11/2016   Procedure: ANTERIOR REPAIR (CYSTOCELE) COLOPLAST FASCIA  SACROSPINOUS FIXATION;  Surgeon: Jethro Bolus, MD;  Location: [facility] [place];  Service: Urology;  Laterality: N/A;   CYSTOSCOPY W/ URETERAL STENT PLACEMENT Bilateral 01/11/2016   Procedure: CYSTOSCOPY WITH BILATERAL RETROGRADE  PYELOGRAM/URETERAL STENT PLACEMENT;  Surgeon: Jethro Bolus, MD;  Location: [facility];  Service: Urology;  Laterality: Bilateral;   ESOPHAGOGASTRODUODENOSCOPY  2007   EXTERNAL EAR SURGERY Bilateral age 5   LAPAROSCOPIC OVARIAN CYSTECTOMY  March 2008   unilateral   MASTECTOMY Left 2018   PUBOVAGINAL SLING N/A 01/11/2016   Procedure:  ALTIS SLING SINGLE INCISION;  Surgeon: Jethro Bolus, MD;  Location: [facility];  Service: Urology;  Laterality: N/A;   RECTOCELE REPAIR N/A 01/11/2016   Procedure: POSTERIOR REPAIR (RECTOCELE), PERINEAPLASTY;  Surgeon: Marcelle Overlie, MD;  Location: [facility];  Service: Gynecology;  Laterality: N/A;   SIMPLE MASTECTOMY WITH AXILLARY SENTINEL NODE BIOPSY Left 11/08/2018   Procedure: LEFT SIMPLE MASTECTOMY WITH LEFT  AXILLARY SENTINEL NODE MAPPING;  Surgeon: Harriette Bouillon, MD;  Location: [facility];  Service: General;  Laterality: Left;   VAGINAL HYSTERECTOMY N/A 01/11/2016   Procedure: TOTAL VAGINAL HYSTERECTOMY ;  Surgeon: Marcelle Overlie, MD;  Location: [facility] ;  Service: Gynecology;  Laterality: N/A;   Family History  Problem Relation Age of Onset   Heart disease Mother        enlarged heart   Cancer Father        pancreatic   COPD Sister    Pulmonary embolism Sister    Edema Sister        Ankles   Heart disease Brother    Polycystic kidney disease Son    Social History   Socioeconomic History   Marital status: Married    Spouse name: Iantha Fallen   Number of children: 3   Years of education: College   Highest education level: Not on file  Occupational History   Occupation: retired  Tobacco Use   Smoking status: Never   Smokeless tobacco: Never  Vaping Use   Vaping Use: Never used  Substance and Sexual Activity   Alcohol use: No   Drug use: No   Sexual activity: Not on file  Other Topics Concern   Not on file  Social History Narrative   Pt lives at home with  family.Caffeine Use: some, occasionally   Has Parkinson's   Social Determinants of Health   Financial Resource Strain: Low Risk  (06/05/2021)   Overall Financial Resource Strain (CARDIA)    Difficulty of Paying Living Expenses: Not hard at all  Food Insecurity: No Food Insecurity (01/05/2023)   Hunger Vital Sign    Worried About Running Out of Food in the Last Year: Never true    Ran Out of Food in the Last Year: Never true  Transportation Needs: No Transportation Needs (01/05/2023)   PRAPARE - Administrator, Civil Service (Medical): No    Lack of Transportation (Non-Medical): No  Physical Activity: Inactive (06/05/2021)   Exercise Vital Sign    Days of Exercise per Week: 0 days    Minutes of  Exercise per Session: 0 min  Stress: Stress Concern Present (06/05/2021)   [employer] of Occupational Health - Occupational Stress Questionnaire    Feeling of Stress : To some extent  Social Connections: Moderately Integrated (06/05/2021)   Social Connection and Isolation Panel [NHANES]    Frequency of Communication with Friends and Family: More than three times a week    Frequency of Social Gatherings with Friends and Family: Once a week    Attends Religious Services: 1 to 4 times per year    Active Member of [employer] or Organizations: No    Attends Engineer, structural: Never    Marital Status: Married    Tobacco Counseling Counseling given: Not Answered   Clinical Intake:  Pre-visit preparation completed: Yes  Pain : No/denies pain  Nutritional Risks: None Diabetes: No  How often do you need to have someone help you when you read instructions, pamphlets, or other written materials from your doctor or pharmacy?: 1 - Never   Activities of Daily Living    01/05/2023   11:07 AM  In your present state of health, do you have any difficulty performing the following activities:  Hearing? 1  Comment has noticed some hearing loss  Vision? 1  Difficulty  concentrating or making decisions? 1  Walking or climbing stairs? 1  Dressing or bathing? 0  Doing errands, shopping? 1  Preparing Food and eating ? N  Using the Toilet? N  In the past six months, have you accidently leaked urine? Y  Do you have problems with loss of bowel control? N  Managing your Medications? Y  Managing your Finances? Y  Housekeeping or managing your Housekeeping? Y    Patient Care Team: Zola Button, Grayling Congress, DO as PCP - General Huston Foley, MD as Consulting Physician (Neurology) Marcelle Overlie, MD as Consulting Physician (Obstetrics and Gynecology) Judi Saa, DO as Consulting Physician (Family Medicine) Jaquita Folds, MD as Consulting Physician (Neurology) Sinda Du, MD as Consulting Physician (Ophthalmology) Serena Croissant, MD as Consulting Physician (Hematology and Oncology) Lonie Peak, MD as Attending Physician (Radiation Oncology) Harriette Bouillon, MD as Consulting Physician (General Surgery) Soundra Pilon, LCSW as Social Worker (Licensed Clinical Social Worker)  Indicate any recent [employer] you may have received from other than [facility] providers in the past year (date may be approximate).     Assessment:   This is a routine wellness examination for Amanda [name].  Hearing/Vision screen No results found.  Dietary issues and exercise activities discussed: Current Exercise Habits: The patient does not participate in regular exercise at present, Exercise limited by: neurologic condition(s)   Goals Addressed   None    Depression Screen    01/05/2023   11:07 AM 09/26/2022   10:13 AM 06/05/2021    1:28 PM 12/14/2020    9:29 AM 05/14/2019    1:12 PM 05/03/2018   11:41 AM 08/15/2017    1:07 PM  PHQ 2/9 Scores  PHQ - 2 Score 0 0 0 0 0 0 0    Fall Risk    01/05/2023   11:05 AM 06/07/2022   11:17 AM 06/05/2021    1:30 PM 05/04/2021    2:45 PM 12/14/2020    9:25 AM  Fall Risk   Falls in the past year? 1 1 1 1 1   Number falls in  past yr: 1 1 1 1 1   Injury with Fall? 0 1 1 1  0  Risk for fall due to : Impaired balance/gait;Impaired mobility  History of fall(s);Impaired balance/gait Impaired balance/gait;History of fall(s) History of fall(s);Impaired balance/gait History of fall(s)  Follow up Falls evaluation completed Falls evaluation completed;Falls prevention discussed Education provided;Falls prevention discussed Falls evaluation completed Falls evaluation completed    FALL RISK PREVENTION PERTAINING TO THE HOME:  Any stairs in or around the home? Yes  If so, are there any without handrails? No  Home free of loose throw rugs in walkways, pet beds, electrical cords, etc? Yes  Adequate lighting in your home to reduce risk of falls? Yes   ASSISTIVE DEVICES UTILIZED TO PREVENT FALLS:  Life alert? No  Use of a cane, walker or w/c? Yes  Grab bars in the bathroom? Yes  Shower chair or bench in shower? Yes  Elevated toilet seat or a handicapped toilet? No   TIMED UP AND GO:  Was the test performed?  No, audio visit .    Cognitive Function:    01/05/2023   11:07 AM 05/14/2019    1:27 PM 05/03/2018   11:44 AM 05/02/2017   12:02 PM  MMSE - Mini Mental State Exam  Not completed: Unable to complete Refused    Orientation to time   5 4  Orientation to time comments    Disoriented to date, off by 1 day.  Orientation to Place   5 5  Registration   3 3  Attention/ Calculation   5 5  Recall   2 3  Language- name 2 objects   2 2  Language- repeat   1 1  Language- follow 3 step command   3 3  Language- read & follow direction   1 1  Write a sentence   1 1  Copy design    1  Total score    29        Immunizations Immunization History  Administered Date(s) Administered   Fluad Quad(high Dose 65+) 05/24/2019, 06/17/2021, 06/07/2022   Influenza Split 08/17/2011   Influenza Whole 10/21/2009   Influenza, High Dose Seasonal PF 05/18/2017, 08/02/2018   Influenza,inj,Quad PF,6+ Mos 07/12/2013   Moderna  Sars-Covid-2 Vaccination 10/24/2019, 11/22/2019, 08/25/2020   Pneumococcal Conjugate-13 03/05/2015   Pneumococcal Polysaccharide-23 03/04/2016   Respiratory Syncytial Virus Vaccine,Recomb Aduvanted(Arexvy) 09/26/2022   Td 04/22/2004, 10/21/2009   Tdap 05/09/2016   Zoster, Live 06/26/2008    TDAP status: Up to date  Flu Vaccine status: Up to date  Pneumococcal vaccine status: Up to date  Covid-19 vaccine status: Information provided on how to obtain vaccines.   Qualifies for Shingles Vaccine? Yes   Zostavax completed Yes   Shingrix Completed?: No.    Education has been provided regarding the importance of this vaccine. Patient has been advised to call insurance company to determine out of pocket expense if they have not yet received this vaccine. Advised may also receive vaccine at local pharmacy or Health Dept. Verbalized acceptance and understanding.  Screening Tests Health Maintenance  Topic Date Due   Zoster Vaccines- Shingrix (1 of 2) Never done   Fecal DNA (Cologuard)  Never done   COVID-19 Vaccine (4 - 2023-24 season) 05/20/2022   Medicare Annual Wellness (AWV)  06/05/2022   MAMMOGRAM  02/11/2023   INFLUENZA VACCINE  04/20/2023   DTaP/Tdap/Td (4 - Td or Tdap) 05/09/2026   Pneumonia Vaccine 27+ Years old  Completed   DEXA SCAN  Completed   Hepatitis C Screening  Completed   HPV VACCINES  Aged Out    Health Maintenance  Health Maintenance Due  Topic Date Due  Zoster Vaccines- Shingrix (1 of 2) Never done   Fecal DNA (Cologuard)  Never done   COVID-19 Vaccine (4 - 2023-24 season) 05/20/2022   Medicare Annual Wellness (AWV)  06/05/2022    Colorectal Cancer screen: Pt declined.  Mammogram status: Completed 02/10/22. Repeat every year  Bone Density status: Pt declined.  Lung Cancer Screening: (Low Dose CT Chest recommended if Age 43-80 years, 30 pack-year currently smoking OR have quit w/in 15years.) does not qualify.   Additional Screening:  Hepatitis C  Screening: does qualify; Completed 12/14/20  Vision Screening: Recommended annual ophthalmology exams for early detection of glaucoma and other disorders of the eye. Is the patient up to date with their annual eye exam?  Yes  Who is the provider or what is the name of the office in which the patient attends annual eye exams? Can't remember name at this time If pt is not established with a provider, would they like to be referred to a provider to establish care? No .   Dental Screening: Recommended annual dental exams for proper oral hygiene  Community Resource Referral / Chronic Care Management: CRR required this visit?  No   CCM required this visit?  No      Plan:     I have personally reviewed and noted the following in the patient's chart:   Medical and social history Use of alcohol, tobacco or illicit drugs  Current medications and supplements including opioid prescriptions. Patient is not currently taking opioid prescriptions. Functional ability and status Nutritional status Physical activity Advanced directives List of other physicians Hospitalizations, surgeries, and ER visits in previous 12 months Vitals Screenings to include cognitive, depression, and falls Referrals and appointments  In addition, I have reviewed and discussed with patient certain preventive protocols, quality metrics, and best practice recommendations. A written personalized care plan for preventive services as well as general preventive health recommendations were provided to patient.   Due to this being a telephonic visit, the after visit summary with patients personalized plan was offered to patient via mail or my-chart. Patient would like to access on my-chart.  Donne Anon, [place]   01/05/2023   Nurse Notes: None

## 2023-01-05 NOTE — Patient Instructions (Signed)
Amanda [name] , Thank you for taking time to come for your Medicare Wellness Visit. I appreciate your ongoing commitment to your health goals. Please review the following plan we discussed and let me know if I can assist you in the future.     This is a list of the screening recommended for you and due dates:  Health Maintenance  Topic Date Due   Zoster (Shingles) Vaccine (1 of 2) Never done   Cologuard (Stool DNA test)  Never done   COVID-19 Vaccine (4 - 2023-24 season) 05/20/2022   Mammogram  02/11/2023   Flu Shot  04/20/2023   Medicare Annual Wellness Visit  01/05/2024   DTaP/Tdap/Td vaccine (4 - Td or Tdap) 05/09/2026   Pneumonia Vaccine  Completed   DEXA scan (bone density measurement)  Completed   Hepatitis C Screening: USPSTF Recommendation to screen - Ages 70-79 yo.  Completed   HPV Vaccine  Aged Out    Next appointment: Follow up in one year for your annual wellness visit.   Preventive Care 15 Years and Older, Female Preventive care refers to lifestyle choices and visits with your health care provider that can promote health and wellness. What does preventive care include? A yearly physical exam. This is also called an annual well check. Dental exams once or twice a year. Routine eye exams. Ask your health care provider how often you should have your eyes checked. Personal lifestyle choices, including: Daily care of your teeth and gums. Regular physical activity. Eating a healthy diet. Avoiding tobacco and drug use. Limiting alcohol use. Practicing safe sex. Taking low-dose aspirin every day. Taking vitamin and mineral supplements as recommended by your health care provider. What happens during an annual well check? The services and screenings done by your health care provider during your annual well check will depend on your age, overall health, lifestyle risk factors, and family history of disease. Counseling  Your health care provider may ask you questions about  your: Alcohol use. Tobacco use. Drug use. Emotional well-being. Home and relationship well-being. Sexual activity. Eating habits. History of falls. Memory and ability to understand (cognition). Work and work Astronomer. Reproductive health. Screening  You may have the following tests or measurements: Height, weight, and BMI. Blood pressure. Lipid and cholesterol levels. These may be checked every 5 years, or more frequently if you are over 20 years old. Skin check. Lung cancer screening. You may have this screening every year starting at age 15 if you have a 30-pack-year history of smoking and currently smoke or have quit within the past 15 years. Fecal occult blood test (FOBT) of the stool. You may have this test every year starting at age 50. Flexible sigmoidoscopy or colonoscopy. You may have a sigmoidoscopy every 5 years or a colonoscopy every 10 years starting at age 85. Hepatitis C blood test. Hepatitis B blood test. Sexually transmitted disease (STD) testing. Diabetes screening. This is done by checking your blood sugar (glucose) after you have not eaten for a while (fasting). You may have this done every 1-3 years. Bone density scan. This is done to screen for osteoporosis. You may have this done starting at age 26. Mammogram. This may be done every 1-2 years. Talk to your health care provider about how often you should have regular mammograms. Talk with your health care provider about your test results, treatment options, and if necessary, the need for more tests. Vaccines  Your health care provider may recommend certain vaccines, such as: Influenza vaccine.  This is recommended every year. Tetanus, diphtheria, and acellular pertussis (Tdap, Td) vaccine. You may need a Td booster every 10 years. Zoster vaccine. You may need this after age 70. Pneumococcal 13-valent conjugate (PCV13) vaccine. One dose is recommended after age 29. Pneumococcal polysaccharide (PPSV23) vaccine.  One dose is recommended after age 21. Talk to your health care provider about which screenings and vaccines you need and how often you need them. This information is not intended to replace advice given to you by your health care provider. Make sure you discuss any questions you have with your health care provider. Document Released: 10/02/2015 Document Revised: 05/25/2016 Document Reviewed: 07/07/2015 Elsevier Interactive Patient Education  2017 [employer].  Fall Prevention in the Home Falls can cause injuries. They can happen to people of all ages. There are many things you can do to make your home safe and to help prevent falls. What can I do on the outside of my home? Regularly fix the edges of walkways and driveways and fix any cracks. Remove anything that might make you trip as you walk through a door, such as a raised step or threshold. Trim any bushes or trees on the path to your home. Use bright outdoor lighting. Clear any walking paths of anything that might make someone trip, such as rocks or tools. Regularly check to see if handrails are loose or broken. Make sure that both sides of any steps have handrails. Any raised decks and porches should have guardrails on the edges. Have any leaves, snow, or ice cleared regularly. Use sand or salt on walking paths during winter. Clean up any spills in your garage right away. This includes oil or grease spills. What can I do in the bathroom? Use night lights. Install grab bars by the toilet and in the tub and shower. Do not use towel bars as grab bars. Use non-skid mats or decals in the tub or shower. If you need to sit down in the shower, use a plastic, non-slip stool. Keep the floor dry. Clean up any water that spills on the floor as soon as it happens. Remove soap buildup in the tub or shower regularly. Attach bath mats securely with double-sided non-slip rug tape. Do not have throw rugs and other things on the floor that can make  you trip. What can I do in the bedroom? Use night lights. Make sure that you have a light by your bed that is easy to reach. Do not use any sheets or blankets that are too big for your bed. They should not hang down onto the floor. Have a firm chair that has side arms. You can use this for support while you get dressed. Do not have throw rugs and other things on the floor that can make you trip. What can I do in the kitchen? Clean up any spills right away. Avoid walking on wet floors. Keep items that you use a lot in easy-to-reach places. If you need to reach something above you, use a strong step stool that has a grab bar. Keep electrical cords out of the way. Do not use floor polish or wax that makes floors slippery. If you must use wax, use non-skid floor wax. Do not have throw rugs and other things on the floor that can make you trip. What can I do with my stairs? Do not leave any items on the stairs. Make sure that there are handrails on both sides of the stairs and use them. Fix handrails  that are broken or loose. Make sure that handrails are as long as the stairways. Check any carpeting to make sure that it is firmly attached to the stairs. Fix any carpet that is loose or worn. Avoid having throw rugs at the top or bottom of the stairs. If you do have throw rugs, attach them to the floor with carpet tape. Make sure that you have a light switch at the top of the stairs and the bottom of the stairs. If you do not have them, ask someone to add them for you. What else can I do to help prevent falls? Wear shoes that: Do not have high heels. Have rubber bottoms. Are comfortable and fit you well. Are closed at the toe. Do not wear sandals. If you use a stepladder: Make sure that it is fully opened. Do not climb a closed stepladder. Make sure that both sides of the stepladder are locked into place. Ask someone to hold it for you, if possible. Clearly mark and make sure that you can  see: Any grab bars or handrails. First and last steps. Where the edge of each step is. Use tools that help you move around (mobility aids) if they are needed. These include: Canes. Walkers. Scooters. Crutches. Turn on the lights when you go into a dark area. Replace any light bulbs as soon as they burn out. Set up your furniture so you have a clear path. Avoid moving your furniture around. If any of your floors are uneven, fix them. If there are any pets around you, be aware of where they are. Review your medicines with your doctor. Some medicines can make you feel dizzy. This can increase your chance of falling. Ask your doctor what other things that you can do to help prevent falls. This information is not intended to replace advice given to you by your health care provider. Make sure you discuss any questions you have with your health care provider. Document Released: 07/02/2009 Document Revised: 02/11/2016 Document Reviewed: 10/10/2014 Elsevier Interactive Patient Education  2017 [employer].

## 2023-01-16 ENCOUNTER — Ambulatory Visit (INDEPENDENT_AMBULATORY_CARE_PROVIDER_SITE_OTHER): Payer: Medicare Other | Admitting: Family

## 2023-01-16 VITALS — BP 103/50 | HR 72 | Temp 97.9°F | Resp 16 | Wt 132.0 lb

## 2023-01-16 DIAGNOSIS — M7021 Olecranon bursitis, right elbow: Secondary | ICD-10-CM

## 2023-01-16 DIAGNOSIS — R21 Rash and other nonspecific skin eruption: Secondary | ICD-10-CM | POA: Diagnosis not present

## 2023-01-16 MED ORDER — NYSTATIN 100000 UNIT/GM EX POWD: 1.0000 | Freq: Three times a day (TID) | CUTANEOUS | 0 refills | Status: DC

## 2023-01-16 MED ORDER — BETAMETHASONE VALERATE 0.1 % EX OINT: 1.0000 | TOPICAL_OINTMENT | Freq: Two times a day (BID) | CUTANEOUS | 0 refills | Status: DC

## 2023-01-16 NOTE — Assessment & Plan Note (Signed)
New. No sign of infection, denies any significant pain.  Will monitor for now. If it worsens or begins to cause her more problems, then we can refer her to sports medicine.

## 2023-01-16 NOTE — Assessment & Plan Note (Addendum)
New.  I think that the lesions on her back are more allergic appearing and she has pruritus. For these lesions, recommend claritin once daily for the next few weeks and topical betamethasone to affected area.   Groin rash appears more fungal in nature.  Recommended bid nystatin powder.

## 2023-01-16 NOTE — Patient Instructions (Signed)
You may use claritin 10mg  once daily for the next week or two. Apply betamethasone cream to the itchy patches twice daily as needed. Apply Nystatin powder to groin twice daily. Call if symptoms worsen or if symptoms do not improve. Let me know if you develop worsening pain/swelling of your right elbow and we can refer you to sports medicine.

## 2023-01-16 NOTE — Progress Notes (Signed)
Subjective:   By signing my name below, I, Amanda [name], attest that this documentation has been prepared under the direction and in the presence of Amanda Craze, NP. 01/16/2023   Patient ID: Amanda [name], female    DOB: 1946-03-27, 77 y.o.   MRN: [id number]  Chief Complaint  Patient presents with   Rash    Complains of skin rash throughout    Mass    Reports a mass on right elbow    Rash   Patient is in today for a office visit. She is present with her care giver during this visit.   Itching rash: She complains of itching rash on her back and groin  area for the past week. She has tried using cortisone cream and found mild relief but her symptoms returned shortly after. She does not remember if she used any new products.   Mass on right elbow: She reports a mass on her right elbow. She has occasional pain if she presses on the mass. He care giver reports she bumps her elbows on the door occasionally.    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   COLONOSCOPY  last one 2007   CYSTOCELE REPAIR N/A 01/11/2016   Procedure: ANTERIOR REPAIR (CYSTOCELE) COLOPLAST FASCIA  SACROSPINOUS FIXATION;  Surgeon: Jethro Bolus, MD;  Location: [facility];  Service: Urology;  Laterality: N/A;   CYSTOSCOPY W/ URETERAL STENT PLACEMENT Bilateral 01/11/2016   Procedure: CYSTOSCOPY WITH BILATERAL RETROGRADE PYELOGRAM/URETERAL STENT PLACEMENT;  Surgeon: Jethro Bolus, MD;  Location: [facility] [place];  Service: Urology;   Laterality: Bilateral;   ESOPHAGOGASTRODUODENOSCOPY  2007   EXTERNAL EAR SURGERY Bilateral age 44   LAPAROSCOPIC OVARIAN CYSTECTOMY  March 2008   unilateral   MASTECTOMY Left 2018   PUBOVAGINAL SLING N/A 01/11/2016   Procedure:  ALTIS SLING SINGLE INCISION;  Surgeon: Jethro Bolus, MD;  Location: [facility];  Service: Urology;  Laterality: N/A;   RECTOCELE REPAIR N/A 01/11/2016   Procedure: POSTERIOR REPAIR (RECTOCELE), PERINEAPLASTY;  Surgeon: Marcelle Overlie, MD;  Location: [facility];  Service: Gynecology;  Laterality: N/A;   SIMPLE MASTECTOMY WITH AXILLARY SENTINEL NODE BIOPSY Left 11/08/2018   Procedure: LEFT SIMPLE MASTECTOMY WITH LEFT  AXILLARY SENTINEL NODE MAPPING;  Surgeon: Harriette Bouillon, MD;  Location: [facility];  Service: General;  Laterality: Left;   VAGINAL HYSTERECTOMY N/A 01/11/2016   Procedure: TOTAL VAGINAL HYSTERECTOMY ;  Surgeon: Marcelle Overlie, MD;  Location: [facility] ;  Service: Gynecology;  Laterality: N/A;    Family History  Problem Relation Age of Onset   Heart disease Mother        enlarged heart   Cancer Father        pancreatic   COPD Sister    Pulmonary embolism Sister    Edema Sister        Ankles   Heart disease Brother    Polycystic kidney disease  Son     Social History   Socioeconomic History   Marital status: Married    Spouse name: Amanda [name]   Number of children: 3   Years of education: [employer] education level: Not on file  Occupational History   Occupation: retired  Tobacco Use   Smoking status: Never   Smokeless tobacco: Never  Vaping Use   Vaping Use: Never used  Substance and Sexual Activity   Alcohol use: No   Drug use: No   Sexual activity: Not on file  Other Topics Concern   Not on file  Social History Narrative   Pt lives at home with family.Caffeine Use: some, occasionally   Has Parkinson's   Social Determinants of Health   Financial Resource Strain: Low Risk   (06/05/2021)   Overall Financial Resource Strain (CARDIA)    Difficulty of Paying Living Expenses: Not hard at all  Food Insecurity: No Food Insecurity (01/05/2023)   Hunger Vital Sign    Worried About Running Out of Food in the Last Year: Never true    Ran Out of Food in the Last Year: Never true  Transportation Needs: No Transportation Needs (01/05/2023)   PRAPARE - Administrator, Civil Service (Medical): No    Lack of Transportation (Non-Medical): No  Physical Activity: Inactive (06/05/2021)   Exercise Vital Sign    Days of Exercise per Week: 0 days    Minutes of Exercise per Session: 0 min  Stress: Stress Concern Present (06/05/2021)   [employer] of Occupational Health - Occupational Stress Questionnaire    Feeling of Stress : To some extent  Social Connections: Moderately Integrated (06/05/2021)   Social Connection and Isolation Panel [NHANES]    Frequency of Communication with Friends and Family: More than three times a week    Frequency of Social Gatherings with Friends and Family: Once a week    Attends Religious Services: 1 to 4 times per year    Active Member of [employer] or Organizations: No    Attends Banker Meetings: Never    Marital Status: Married  Catering manager Violence: Not At Risk (01/05/2023)   Humiliation, Afraid, Rape, and Kick questionnaire    Fear of Current or Ex-Partner: No    Emotionally Abused: No    Physically Abused: No    Sexually Abused: No    Outpatient Medications Prior to Visit  Medication Sig Dispense Refill   AMBULATORY NON FORMULARY MEDICATION Medication Name: Compression Stockings 20-66mmHg  1 pair 1 each 0   aspirin EC 325 MG tablet Take 325 mg by mouth daily as needed for moderate pain.     b complex vitamins tablet Take 1 tablet by mouth daily. MEGA FOOD BALANCE SOURCE     CALCIUM-MAGNESIUM-VITAMIN D PO Take 2 tablets by mouth 2 (two) times daily.     carbidopa-levodopa (SINEMET IR) 25-100 MG tablet Take 1  tablet by mouth 3 (three) times daily. 1 whole in morning and 1/2 tablet every 3 hours     Cholecalciferol (VITAMIN D3) 1000 UNIT/SPRAY LIQD Take 2 sprays by mouth 2 (two) times daily.     famotidine (PEPCID) 20 MG tablet TAKE 1 TABLET BY MOUTH TWICE A DAY 180 tablet 1   mirabegron ER (MYRBETRIQ) 25 MG TB24 tablet Take 1 tablet (25 mg total) by mouth daily. 30 tablet 1   Probiotic Product (PROBIOTIC DAILY) CAPS Take 1 capsule by mouth daily as needed (indigestion).     Propylene Glycol-Glycerin (SOOTHE  OP) Place 2 drops into both eyes daily as needed (dry eyes).     rivastigmine (EXELON) 4.6 mg/24hr APPLY 1 PATCH TOPICALLY ONCE DAILY     RSV vaccine recomb adjuvanted (AREXVY) 120 MCG/0.5ML injection Inject into the muscle. 0.5 mL 0   sertraline (ZOLOFT) 50 MG tablet Take 1 tablet (50 mg total) by mouth at bedtime. 90 tablet 3   No facility-administered medications prior to visit.    Allergies  Allergen Reactions   Penicillins     Leg pain Did it involve swelling of the face/tongue/throat, SOB, or low BP? No Did it involve sudden or severe rash/hives, skin peeling, or any reaction on the inside of your mouth or nose? No Did you need to seek medical attention at a hospital or doctor's office? No When did it last happen?      childhood allergy If all above answers are "NO", may proceed with cephalosporin use.      Review of Systems  Skin:  Positive for itching (on rash sites) and rash (on back and vaginal area).       Objective:    Physical Exam Constitutional:      General: She is not in acute distress.    Appearance: Normal appearance. She is not ill-appearing.  HENT:     Head: Normocephalic and atraumatic.     Right Ear: External ear normal.     Left Ear: External ear normal.  Eyes:     Extraocular Movements: Extraocular movements intact.     Pupils: Pupils are equal, round, and reactive to light.  Cardiovascular:     Rate and Rhythm: Normal rate and regular rhythm.      Heart sounds: Normal heart sounds. No murmur heard.    No gallop.  Pulmonary:     Effort: Pulmonary effort is normal. No respiratory distress.     Breath sounds: Normal breath sounds. No wheezing or rales.  Musculoskeletal:     Right elbow: Effusion (no erthythema, no warmth) present. No tenderness.     Left elbow: Normal.  Skin:    General: Skin is warm and dry.     Comments: Erythema noted bilateral groin Raised macular lesion noted under bra line  Neurological:     Mental Status: She is alert and oriented to person, place, and time.  Psychiatric:        Judgment: Judgment normal.     BP (!) 103/50 (BP Location: Left Arm, Patient Position: Sitting, Cuff Size: Small)   Pulse 72   Temp 97.9 F (36.6 C) (Oral)   Resp 16   Wt 132 lb (59.9 kg)   SpO2 99%   BMI 22.66 kg/m  Wt Readings from Last 3 Encounters:  01/16/23 132 lb (59.9 kg)  10/27/22 129 lb (58.5 kg)  06/07/22 128 lb 6.4 oz (58.2 kg)       Assessment & Plan:  Skin rash Assessment & Plan: New.  I think that the lesions on her back are more allergic appearing and she has pruritus. For these lesions, recommend claritin once daily for the next few weeks and topical betamethasone to affected area.   Groin rash appears more fungal in nature.  Recommended bid nystatin powder.    Olecranon bursitis of right elbow Assessment & Plan: New. No sign of infection, denies any significant pain.  Will monitor for now. If it worsens or begins to cause her more problems, then we can refer her to sports medicine.    Other orders -  Nystatin; Apply 1 Application topically 3 (three) times daily.  Dispense: 15 g; Refill: 0 -     Betamethasone Valerate; Apply 1 Application topically 2 (two) times daily.  Dispense: 30 g; Refill: 0    I, Lemont Fillers, NP, personally preformed the services described in this documentation.  All medical record entries made by the scribe were at my direction and in my presence.  I have  reviewed the chart and discharge instructions (if applicable) and agree that the record reflects my personal performance and is accurate and complete. 01/16/2023   I,Amanda [name],acting as a Neurosurgeon for Lemont Fillers, NP.,have documented all relevant documentation on the behalf of Lemont Fillers, NP,as directed by  Lemont Fillers, NP while in the presence of Lemont Fillers, NP.   Lemont Fillers, NP

## 2023-01-20 ENCOUNTER — Other Ambulatory Visit: Payer: Self-pay | Admitting: Family Medicine

## 2023-01-20 DIAGNOSIS — R32 Unspecified urinary incontinence: Secondary | ICD-10-CM

## 2023-01-20 NOTE — Telephone Encounter (Signed)
Prescription Request  01/20/2023  Is this a "Controlled Substance" medicine? No  LOV: 10/27/2022  What is the name of the medication or equipment?  mirabegron ER   Have you contacted your pharmacy to request a refill? Yes   Which pharmacy would you like this sent to?  [id number] [place], [place] - [address] [place] [address] Phone: [phone] Fax: [phone]   Patient notified that their request is being sent to the clinical staff for review and that they should receive a response within 2 business days.   Please advise at [facility] [phone]

## 2023-02-13 ENCOUNTER — Encounter: Payer: Self-pay | Admitting: Family Medicine

## 2023-02-13 DIAGNOSIS — M7031 Other bursitis of elbow, right elbow: Secondary | ICD-10-CM

## 2023-02-15 NOTE — Telephone Encounter (Signed)
Should we place a referral to sports med? Pt's daughter wanted to know.

## 2023-02-24 DIAGNOSIS — J069 Acute upper respiratory infection, unspecified: Secondary | ICD-10-CM | POA: Diagnosis not present

## 2023-02-28 ENCOUNTER — Ambulatory Visit (INDEPENDENT_AMBULATORY_CARE_PROVIDER_SITE_OTHER): Payer: Medicare Other | Admitting: Family Medicine

## 2023-02-28 ENCOUNTER — Other Ambulatory Visit: Payer: Self-pay

## 2023-02-28 ENCOUNTER — Encounter: Payer: Self-pay | Admitting: Family Medicine

## 2023-02-28 VITALS — BP 124/58 | HR 87 | Ht 64.0 in | Wt 123.0 lb

## 2023-02-28 DIAGNOSIS — M25521 Pain in right elbow: Secondary | ICD-10-CM | POA: Diagnosis not present

## 2023-02-28 DIAGNOSIS — M7021 Olecranon bursitis, right elbow: Secondary | ICD-10-CM | POA: Diagnosis not present

## 2023-02-28 NOTE — Patient Instructions (Signed)
Thank you for coming in today.   I recommend you obtained a compression sleeve to help with your joint problems. There are many options on the market however I recommend obtaining a Elbow Body Helix compression sleeve.  You can find information (including how to appropriate measure yourself for sizing) can be found at www.Body [employer].ch.  Many of these products are health savings account (HSA) eligible.  You can use the compression sleeve at any time throughout the day but is most important to use while being active as well as for 2 hours post-activity.   It is appropriate to ice following activity with the compression sleeve in place.   Please use Voltaren gel (Generic Diclofenac Gel) up to 4x daily for pain as needed.  This is available over-the-counter as both the name brand Voltaren gel and the generic diclofenac gel.   Check back in 1 month

## 2023-02-28 NOTE — Progress Notes (Signed)
   I, Stevenson Clinch, CMA acting as a scribe for Clementeen Graham, MD.  Amanda [name] is a 77 y.o. female who presents to [facility] at [facility] today for R elbow pain x 2 months. Pt locates pain to olocrenon process. Swelling present. Radicular sx present. Notes frequently hitting the elbow on door frames while passing hrough. The right elbow is tender to palpation and feels warm to the touch. Denies erythema.  Patient has Parkinson disease with abnormal motor Tennity and frequently uses his in a wheelchair or sitting.  R elbow swelling: yes Radiates: upper and lower arm Paresthesias: yes Treatments tried: IBU  Pertinent review of systems: No fevers or chills  Relevant historical information: Parkinson's disease and breast cancer.   Exam:  BP (!) 124/58   Pulse 87   Ht 5\' 4"  (1.626 m)   Wt 123 lb (55.8 kg)   SpO2 94%   BMI 21.11 kg/m  General: Well Developed, well nourished, and in no acute distress.   MSK: Right elbow swelling overlying olecranon.  Mildly tender palpation.  Normal elbow motion. Seated in wheelchair with chorea motion involving upper and lower extremities.  She had a challenging time holding her arm still for exam.    Assessment and Plan: 77 y.o. female with right olecranon bursitis.  I think the fundamental issue is that she sits and leans on her elbow a lot.  Will try some conservative management with Voltaren gel and compressive elbow sleeve with padding.  If this is not sufficient in 1 month would consider injection.  Aspiration injection should be helpful here but she is moving so much in exam today that I am fearful that the injection may be too challenging to do safely. Recheck in 1 month.  PDMP not reviewed this encounter. Orders Placed This Encounter  Procedures   [place] LIMITED JOINT SPACE STRUCTURES UP RIGHT(NO LINKED CHARGES)    Order Specific Question:   Reason for Exam (SYMPTOM  OR DIAGNOSIS REQUIRED)    Answer:   right elbow pain     Order Specific Question:   Preferred imaging location?    Answer:   [facility]   No orders of the defined types were placed in this encounter.    Discussed warning signs or symptoms. Please see discharge instructions. Patient expresses understanding.   The above documentation has been reviewed and is accurate and complete Clementeen Graham, M.D.

## 2023-03-15 ENCOUNTER — Encounter: Payer: Self-pay | Admitting: Family Medicine

## 2023-03-28 ENCOUNTER — Ambulatory Visit: Payer: Medicare Other | Admitting: Family Medicine

## 2023-04-10 DIAGNOSIS — H5213 Myopia, bilateral: Secondary | ICD-10-CM | POA: Diagnosis not present

## 2023-04-10 DIAGNOSIS — H2513 Age-related nuclear cataract, bilateral: Secondary | ICD-10-CM | POA: Diagnosis not present

## 2023-04-10 DIAGNOSIS — H04123 Dry eye syndrome of bilateral lacrimal glands: Secondary | ICD-10-CM | POA: Diagnosis not present

## 2023-04-24 DIAGNOSIS — G20B1 Parkinson's disease with dyskinesia, without mention of fluctuations: Secondary | ICD-10-CM | POA: Diagnosis not present

## 2023-04-24 DIAGNOSIS — N3944 Nocturnal enuresis: Secondary | ICD-10-CM | POA: Diagnosis not present

## 2023-04-28 ENCOUNTER — Ambulatory Visit: Payer: Medicare Other | Admitting: Family Medicine

## 2023-04-28 NOTE — Progress Notes (Deleted)
   Rubin Payor, PhD, LAT, ATC acting as a scribe for Clementeen Graham, MD.  Amanda [name] is a 77 y.o. female who presents to [facility] at [facility] today for 12-month f/u R elbow pain. Pt was last seen by Dr. Denyse Amass on 02/28/23 and was advised to use Voltaren gel and compressions and a padding compression sleeve.  Today, pt reports ***  Pertinent review of systems: ***  Relevant historical information: ***   Exam:  There were no vitals taken for this visit. General: Well Developed, well nourished, and in no acute distress.   MSK: ***    Lab and Radiology Results No results found for this or any previous visit (from the past 72 hour(s)). No results found.     Assessment and Plan: 77 y.o. female with ***   PDMP not reviewed this encounter. No orders of the defined types were placed in this encounter.  No orders of the defined types were placed in this encounter.    Discussed warning signs or symptoms. Please see discharge instructions. Patient expresses understanding.   ***

## 2023-05-03 ENCOUNTER — Ambulatory Visit (INDEPENDENT_AMBULATORY_CARE_PROVIDER_SITE_OTHER): Payer: Medicare Other | Admitting: Family

## 2023-05-03 VITALS — BP 100/52 | HR 78 | Temp 98.0°F | Resp 16 | Wt 130.0 lb

## 2023-05-03 DIAGNOSIS — R10A1 Flank pain, right side: Secondary | ICD-10-CM | POA: Insufficient documentation

## 2023-05-03 DIAGNOSIS — R109 Unspecified abdominal pain: Secondary | ICD-10-CM

## 2023-05-03 NOTE — Assessment & Plan Note (Signed)
Pain improved since onset. No fever, hematuria, or dysuria. No tenderness on percussion of right flank, no abdominal tenderness. Differential includes musculoskeletal pain, urinary tract infection, or kidney stone. -Collect urine for urinalysis and culture (unfortunately pt was unable to provide a sample at the time of visit so family will bring the specimen back) -Advise patient to take Tylenol and use a heating pad as needed. -If severe pain or fever develops, patient should notify the clinic.

## 2023-05-03 NOTE — Progress Notes (Signed)
Subjective:     Patient ID: Amanda [name], female    DOB: 05/13/1946, 77 y.o.   MRN: [id number]  Chief Complaint  Patient presents with   Flank Pain    Patient reports bilateral flank pain on and off.  "Really bad last night and this morning".     HPI  Discussed the use of AI scribe software for clinical note transcription with the patient, who gave verbal consent to proceed.  History of Present Illness   The patient, with a history of Parkinson's disease, presents with a new onset of right-sided abdominal pain that started yesterday. The pain was described as a 'kink' and was initially thought to be musculoskeletal or digestive in nature. The patient denies any urinary symptoms such as dysuria or hematuria, and denies fever. The pain was present this morning but has since improved. The patient also reported some mild  left-sided abdominal pain today. The patient's caregiver noted a possible correlation between the patient's pain and consumption of dairy products. The patient took two aspirin for the pain. Her caregiver and husband accompany her to today's visit.          Health Maintenance Due  Topic Date Due   Zoster Vaccines- Shingrix (1 of 2) 05/22/1966   Fecal DNA (Cologuard)  Never done   COVID-19 Vaccine (4 - 2023-24 season) 05/20/2022   MAMMOGRAM  02/11/2023   INFLUENZA VACCINE  04/20/2023    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   COLONOSCOPY  last one 2007   CYSTOCELE REPAIR N/A 01/11/2016   Procedure: ANTERIOR REPAIR  (CYSTOCELE) COLOPLAST FASCIA  SACROSPINOUS FIXATION;  Surgeon: Jethro Bolus, MD;  Location: [facility];  Service: Urology;  Laterality: N/A;   CYSTOSCOPY W/ URETERAL STENT PLACEMENT Bilateral 01/11/2016   Procedure: CYSTOSCOPY WITH BILATERAL RETROGRADE PYELOGRAM/URETERAL STENT PLACEMENT;  Surgeon: Jethro Bolus, MD;  Location: [facility] [place];  Service: Urology;  Laterality: Bilateral;   ESOPHAGOGASTRODUODENOSCOPY  2007   EXTERNAL EAR SURGERY Bilateral age 16   LAPAROSCOPIC OVARIAN CYSTECTOMY  March 2008   unilateral   MASTECTOMY Left 2018   PUBOVAGINAL SLING N/A 01/11/2016   Procedure:  ALTIS SLING SINGLE INCISION;  Surgeon: Jethro Bolus, MD;  Location: [facility];  Service: Urology;  Laterality: N/A;   RECTOCELE REPAIR N/A 01/11/2016   Procedure: POSTERIOR REPAIR (RECTOCELE), PERINEAPLASTY;  Surgeon: Marcelle Overlie, MD;  Location: [facility];  Service: Gynecology;  Laterality: N/A;   SIMPLE MASTECTOMY WITH AXILLARY SENTINEL NODE BIOPSY Left 11/08/2018   Procedure: LEFT SIMPLE MASTECTOMY WITH LEFT  AXILLARY SENTINEL NODE MAPPING;  Surgeon: Harriette Bouillon, MD;  Location: [facility];  Service: General;  Laterality: Left;   VAGINAL HYSTERECTOMY N/A 01/11/2016   Procedure: TOTAL VAGINAL HYSTERECTOMY ;  Surgeon: Marcelle Overlie, MD;  Location: [facility];  Service: Gynecology;  Laterality: N/A;    Family History  Problem Relation Age of Onset  Heart disease Mother        enlarged heart   Cancer Father        pancreatic   COPD Sister    Pulmonary embolism Sister    Edema Sister        Ankles   Heart disease Brother    Polycystic kidney disease Son     Social History   Socioeconomic History   Marital status: Married    Spouse name: Iantha Fallen   Number of children: 3   Years of education: College   Highest education level: Not on file  Occupational History   Occupation: retired  Tobacco Use   Smoking  status: Never   Smokeless tobacco: Never  Vaping Use   Vaping status: Never Used  Substance and Sexual Activity   Alcohol use: No   Drug use: No   Sexual activity: Not on file  Other Topics Concern   Not on file  Social History Narrative   Pt lives at home with family.Caffeine Use: some, occasionally   Has Parkinson's   Social Determinants of Health   Financial Resource Strain: Low Risk  (06/05/2021)   Overall Financial Resource Strain (CARDIA)    Difficulty of Paying Living Expenses: Not hard at all  Food Insecurity: No Food Insecurity (01/05/2023)   Hunger Vital Sign    Worried About Running Out of Food in the Last Year: Never true    Ran Out of Food in the Last Year: Never true  Transportation Needs: No Transportation Needs (01/05/2023)   PRAPARE - Administrator, Civil Service (Medical): No    Lack of Transportation (Non-Medical): No  Physical Activity: Inactive (06/05/2021)   Exercise Vital Sign    Days of Exercise per Week: 0 days    Minutes of Exercise per Session: 0 min  Stress: Stress Concern Present (06/05/2021)   [employer] of Occupational Health - Occupational Stress Questionnaire    Feeling of Stress : To some extent  Social Connections: Moderately Integrated (06/05/2021)   Social Connection and Isolation Panel [NHANES]    Frequency of Communication with Friends and Family: More than three times a week    Frequency of Social Gatherings with Friends and Family: Once a week    Attends Religious Services: 1 to 4 times per year    Active Member of [employer] or Organizations: No    Attends Banker Meetings: Never    Marital Status: Married  Catering manager Violence: Not At Risk (01/05/2023)   Humiliation, Afraid, Rape, and Kick questionnaire    Fear of Current or Ex-Partner: No    Emotionally Abused: No    Physically Abused: No    Sexually Abused: No    Outpatient Medications Prior to Visit  Medication Sig Dispense Refill   AMBULATORY  NON FORMULARY MEDICATION Medication Name: Compression Stockings 20-79mmHg  1 pair 1 each 0   aspirin EC 325 MG tablet Take 325 mg by mouth daily as needed for moderate pain.     b complex vitamins tablet Take 1 tablet by mouth daily. MEGA FOOD BALANCE SOURCE     betamethasone valerate ointment (VALISONE) 0.1 % Apply 1 Application topically 2 (two) times daily. 30 g 0   CALCIUM-MAGNESIUM-VITAMIN D PO Take 2 tablets by mouth 2 (two) times daily.     carbidopa-levodopa (SINEMET IR) 25-100 MG tablet Take 1 tablet by mouth 3 (three) times daily. 1 whole in morning and 1/2 tablet every 3 hours     Cholecalciferol (VITAMIN  D3) 1000 UNIT/SPRAY LIQD Take 2 sprays by mouth 2 (two) times daily.     famotidine (PEPCID) 20 MG tablet TAKE 1 TABLET BY MOUTH TWICE A DAY 180 tablet 1   mirabegron ER (MYRBETRIQ) 25 MG TB24 tablet TAKE 1 TABLET (25 MG TOTAL) BY MOUTH DAILY. 90 tablet 1   nystatin (MYCOSTATIN/NYSTOP) powder Apply 1 Application topically 3 (three) times daily. 15 g 0   Probiotic Product (PROBIOTIC DAILY) CAPS Take 1 capsule by mouth daily as needed (indigestion).     Propylene Glycol-Glycerin (SOOTHE OP) Place 2 drops into both eyes daily as needed (dry eyes).     rivastigmine (EXELON) 4.6 mg/24hr APPLY 1 PATCH TOPICALLY ONCE DAILY     RSV vaccine recomb adjuvanted (AREXVY) 120 MCG/0.5ML injection Inject into the muscle. 0.5 mL 0   sertraline (ZOLOFT) 50 MG tablet Take 1 tablet (50 mg total) by mouth at bedtime. 90 tablet 3   No facility-administered medications prior to visit.    Allergies  Allergen Reactions   Penicillins     Leg pain Did it involve swelling of the face/tongue/throat, SOB, or low BP? No Did it involve sudden or severe rash/hives, skin peeling, or any reaction on the inside of your mouth or nose? No Did you need to seek medical attention at a hospital or doctor's office? No When did it last happen?      childhood allergy If all above answers are "NO", may proceed with  cephalosporin use.      ROS See HPI    Objective:    Physical Exam Constitutional:      General: She is not in acute distress.    Appearance: Normal appearance. She is well-developed.  HENT:     Head: Normocephalic and atraumatic.     Right Ear: External ear normal.     Left Ear: External ear normal.  Eyes:     General: No scleral icterus. Neck:     Thyroid: No thyromegaly.  Cardiovascular:     Rate and Rhythm: Normal rate and regular rhythm.     Heart sounds: Normal heart sounds. No murmur heard. Pulmonary:     Effort: Pulmonary effort is normal. No respiratory distress.     Breath sounds: Normal breath sounds. No wheezing.  Abdominal:     Palpations: Abdomen is soft.     Tenderness: There is no abdominal tenderness. There is no right CVA tenderness or left CVA tenderness.     Hernia: No hernia is present.  Musculoskeletal:     Cervical back: Neck supple.  Skin:    General: Skin is warm and dry.  Neurological:     Mental Status: She is alert and oriented to person, place, and time.     Comments: Involuntary RUE/RLE parkinsonian movements noted  Voice is soft  Psychiatric:        Mood and Affect: Mood normal.        Behavior: Behavior normal.        Thought Content: Thought content normal.        Judgment: Judgment normal.      BP (!) 100/52 (BP Location: Right Arm, Patient Position: Sitting, Cuff Size: Small)   Pulse 78   Temp 98 F (36.7 C) (Oral)   Resp 16   Wt 130 lb (59 kg)   SpO2 96%   BMI 22.31 kg/m  Wt Readings from Last 3 Encounters:  05/03/23 130 lb (59 kg)  02/28/23 123 lb (55.8 kg)  01/16/23 132 lb (59.9  kg)       Assessment & Plan:   Problem List Items Addressed This Visit       Unprioritized   Acute right flank pain - Primary    Pain improved since onset. No fever, hematuria, or dysuria. No tenderness on percussion of right flank, no abdominal tenderness. Differential includes musculoskeletal pain, urinary tract infection, or  kidney stone. -Collect urine for urinalysis and culture (unfortunately pt was unable to provide a sample at the time of visit so family will bring the specimen back) -Advise patient to take Tylenol and use a heating pad as needed. -If severe pain or fever develops, patient should notify the clinic.       I am having [name] maintain her b complex vitamins, Vitamin D3, Probiotic Daily, AMBULATORY NON FORMULARY MEDICATION, CALCIUM-MAGNESIUM-VITAMIN D PO, carbidopa-levodopa, aspirin EC, Propylene Glycol-Glycerin (SOOTHE OP), famotidine, sertraline, rivastigmine, Arexvy, nystatin, betamethasone valerate ointment, and Myrbetriq.  No orders of the defined types were placed in this encounter.

## 2023-05-03 NOTE — Patient Instructions (Signed)
VISIT SUMMARY:  During your visit, we discussed your recent onset of right-sided abdominal pain and the possibility of it being related to your consumption of dairy products. We considered several possible causes for your pain, including muscle strain, urinary tract infection, or kidney stones. We also discussed the possibility of a dietary intolerance to lactose.  YOUR PLAN:  -RIGHT FLANK PAIN: This is the pain you're experiencing on your right side. We're going to collect a urine sample to check for any signs of infection or kidney stones. In the meantime, you can take Tylenol and use a heating pad to help with the pain. If the pain becomes severe or if you develop a fever, please contact the clinic immediately.

## 2023-05-29 DIAGNOSIS — H04123 Dry eye syndrome of bilateral lacrimal glands: Secondary | ICD-10-CM | POA: Diagnosis not present

## 2023-06-05 ENCOUNTER — Encounter: Payer: Self-pay | Admitting: Family

## 2023-06-05 MED ORDER — CEPHALEXIN 500 MG PO CAPS: 500.0000 mg | ORAL_CAPSULE | Freq: Three times a day (TID) | ORAL | 0 refills | Status: DC

## 2023-06-08 ENCOUNTER — Other Ambulatory Visit: Payer: Self-pay | Admitting: Hematology and Oncology

## 2023-06-08 DIAGNOSIS — Z0000 Encounter for general adult medical examination without abnormal findings: Secondary | ICD-10-CM

## 2023-06-21 DIAGNOSIS — Z01419 Encounter for gynecological examination (general) (routine) without abnormal findings: Secondary | ICD-10-CM | POA: Diagnosis not present

## 2023-06-21 DIAGNOSIS — N958 Other specified menopausal and perimenopausal disorders: Secondary | ICD-10-CM | POA: Diagnosis not present

## 2023-06-21 DIAGNOSIS — M816 Localized osteoporosis [Lequesne]: Secondary | ICD-10-CM | POA: Diagnosis not present

## 2023-06-29 ENCOUNTER — Ambulatory Visit: Payer: Medicare Other

## 2023-06-29 DIAGNOSIS — H2513 Age-related nuclear cataract, bilateral: Secondary | ICD-10-CM | POA: Diagnosis not present

## 2023-06-29 DIAGNOSIS — H04123 Dry eye syndrome of bilateral lacrimal glands: Secondary | ICD-10-CM | POA: Diagnosis not present

## 2023-07-26 ENCOUNTER — Ambulatory Visit
Admission: RE | Admit: 2023-07-26 | Discharge: 2023-07-26 | Disposition: A | Discharge status: Home / Self Care | Payer: Medicare Other | Source: Ambulatory Visit | Attending: Hematology and Oncology | Admitting: Hematology and Oncology

## 2023-07-26 DIAGNOSIS — Z1231 Encounter for screening mammogram for malignant neoplasm of breast: Secondary | ICD-10-CM | POA: Diagnosis not present

## 2023-07-26 DIAGNOSIS — Z0000 Encounter for general adult medical examination without abnormal findings: Secondary | ICD-10-CM

## 2023-09-07 DIAGNOSIS — F02A4 Dementia in other diseases classified elsewhere, mild, with anxiety: Secondary | ICD-10-CM | POA: Diagnosis not present

## 2023-09-07 DIAGNOSIS — G20A1 Parkinson's disease without dyskinesia, without mention of fluctuations: Secondary | ICD-10-CM | POA: Diagnosis not present

## 2023-09-19 ENCOUNTER — Other Ambulatory Visit: Payer: Self-pay | Admitting: Family Medicine

## 2023-09-19 ENCOUNTER — Encounter: Payer: Self-pay | Admitting: Family Medicine

## 2023-09-19 DIAGNOSIS — L602 Onychogryphosis: Secondary | ICD-10-CM

## 2023-09-19 NOTE — Telephone Encounter (Signed)
Referral

## 2023-09-25 ENCOUNTER — Other Ambulatory Visit: Payer: Self-pay | Admitting: Family Medicine

## 2023-09-25 DIAGNOSIS — R32 Unspecified urinary incontinence: Secondary | ICD-10-CM

## 2023-09-28 ENCOUNTER — Encounter: Payer: Self-pay | Admitting: Family Medicine

## 2023-10-02 ENCOUNTER — Ambulatory Visit (INDEPENDENT_AMBULATORY_CARE_PROVIDER_SITE_OTHER): Payer: Medicare Other | Admitting: Family Medicine

## 2023-10-02 ENCOUNTER — Encounter: Payer: Self-pay | Admitting: Family Medicine

## 2023-10-02 VITALS — HR 80 | Temp 98.6°F

## 2023-10-02 DIAGNOSIS — Z23 Encounter for immunization: Secondary | ICD-10-CM | POA: Diagnosis not present

## 2023-10-02 DIAGNOSIS — R8290 Unspecified abnormal findings in urine: Secondary | ICD-10-CM | POA: Diagnosis not present

## 2023-10-02 DIAGNOSIS — G20B2 Parkinson's disease with dyskinesia, with fluctuations: Secondary | ICD-10-CM | POA: Diagnosis not present

## 2023-10-02 DIAGNOSIS — B359 Dermatophytosis, unspecified: Secondary | ICD-10-CM | POA: Diagnosis not present

## 2023-10-02 DIAGNOSIS — N390 Urinary tract infection, site not specified: Secondary | ICD-10-CM | POA: Diagnosis not present

## 2023-10-02 MED ORDER — NYSTATIN 100000 UNIT/GM EX CREA: 1.0000 | TOPICAL_CREAM | Freq: Two times a day (BID) | CUTANEOUS | 0 refills | Status: DC

## 2023-10-02 MED ORDER — HYDROCORTISONE ACETATE 25 MG RE SUPP: 25.0000 mg | Freq: Two times a day (BID) | RECTAL | 0 refills | Status: AC

## 2023-10-02 MED ORDER — CEPHALEXIN 500 MG PO CAPS: 500.0000 mg | ORAL_CAPSULE | Freq: Two times a day (BID) | ORAL | 0 refills | Status: AC

## 2023-10-02 NOTE — Progress Notes (Signed)
 Established Patient Office Visit  Subjective   Patient ID: Amanda [name], female    DOB: 1945-09-21  Age: 78 y.o. MRN: [id number]  Chief Complaint  Patient presents with   Follow-up    HPI Discussed the use of AI scribe software for clinical note transcription with the patient, who gave verbal consent to proceed.  History of Present Illness   The patient, with a history of Parkinson's disease, presents with concerns about strong-smelling urine and excessive urination, particularly at night. The patient's caregiver reports that the patient's urine has been very smelly for a while, to the point of flooding the bed during the night, even when the patient has had nothing to drink. The patient's caregiver also reports that the patient has been experiencing on-and-off constipation and has a hemorrhoid.  The patient has been experiencing some redness between the thighs, which is thought to be due to friction from wetness and chafing. The patient has been using a cream for this, which was prescribed during a previous visit. The patient also reports that the rash can be itchy, particularly on the side of the leg.  The patient's caregiver reports that the patient has been experiencing more dementia-like symptoms, including hallucinations, which she attributes to the progression of the patient's Parkinson's disease. The patient's caregiver also reports that the patient sweats a lot, which she believes may be due to the patient's medications and constant movement due to Parkinson's disease.  The patient's caregiver reports that she has been having difficulty obtaining a urine sample for testing, describing it as a hit and miss process. Despite this, the caregiver reports that the patient is acting normally for her, with no signs of confusion or other major changes in behavior.  The patient's caregiver also reports that the patient drinks water  and tea with lemon, and occasionally cranberry juice. The  patient is also reported to have a good bowel movement, with no signs of straining or cramping. The patient's caregiver reports that the patient uses a bedside commode and wears a brief and pads, which sometimes leads to wetness and chafing.  The patient's caregiver reports that the patient is bathed every other day to maintain cleanliness and prevent infections. Despite this, the patient's caregiver reports that the patient sometimes has a strong odor to her urine, which she believes may be due to the patient's vitamins and Parkinson's medication. The caregiver also reports that the patient has a bidet, but it is currently not working.      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   Diarrhea 12/14/2020   Incontinence in female 12/14/2020   Dyspepsia 12/14/2020   Bunion 08/19/2019   Hammer toe 08/19/2019   Breast cancer, stage 2, left (HCC) 11/08/2018   Flexural eczema 05/13/2018   Malignant neoplasm of lower-outer quadrant of left breast of female, estrogen receptor positive (HCC) 08/14/2017   Edema, lower extremity 05/02/2017   Insect sting 05/09/2016   Prolapse of female pelvic organs 01/11/2016   Biceps tendinitis on left  02/18/2014   Parkinson disease (HCC) 10/03/2012   Sinusitis 11/09/2011   Anxiety 06/03/2009   OVARIAN CYST 06/02/2008   Osteopenia 06/02/2008   FATIGUE 06/02/2008   Past Medical History:  Diagnosis Date   Breast cancer, left breast (HCC)    Frequency of  urination    GAD (generalized anxiety disorder)    Osteopenia    Parkinson disease (HCC)    neurologist-  dr charolett mar--  right side predominant   Parkinson disease (HCC)    Prolapse of vaginal wall    Urgency of urination    Wears glasses    Past Surgical History:  Procedure Laterality Date   ABDOMINAL HYSTERECTOMY     BREAST BIOPSY Left 07/2017   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   COLONOSCOPY  last one 2007   CYSTOCELE REPAIR N/A 01/11/2016   Procedure: ANTERIOR REPAIR (CYSTOCELE) COLOPLAST FASCIA  SACROSPINOUS FIXATION;  Surgeon: Arlena Gal, MD;  Location: [facility] [place];  Service: Urology;  Laterality: N/A;   CYSTOSCOPY W/ URETERAL STENT PLACEMENT Bilateral 01/11/2016   Procedure: CYSTOSCOPY WITH BILATERAL RETROGRADE PYELOGRAM/URETERAL STENT PLACEMENT;  Surgeon: Arlena Gal, MD;  Location: [facility] [place];  Service: Urology;  Laterality: Bilateral;   ESOPHAGOGASTRODUODENOSCOPY  2007   EXTERNAL EAR SURGERY Bilateral age 89   LAPAROSCOPIC OVARIAN CYSTECTOMY  March 2008   unilateral   MASTECTOMY Left 2018   PUBOVAGINAL SLING N/A 01/11/2016   Procedure:  ALTIS SLING SINGLE INCISION;  Surgeon: Arlena Gal, MD;  Location: [facility];  Service: Urology;  Laterality: N/A;   RECTOCELE REPAIR N/A 01/11/2016   Procedure: POSTERIOR REPAIR (RECTOCELE), PERINEAPLASTY;  Surgeon: Rosaline Cobble, MD;  Location: [facility] [place];  Service: Gynecology;  Laterality: N/A;   SIMPLE MASTECTOMY WITH AXILLARY SENTINEL NODE BIOPSY Left 11/08/2018   Procedure: LEFT SIMPLE MASTECTOMY WITH LEFT  AXILLARY SENTINEL NODE MAPPING;  Surgeon: Vanderbilt Ned, MD;  Location: [facility];  Service: General;  Laterality: Left;   VAGINAL HYSTERECTOMY N/A 01/11/2016   Procedure: TOTAL VAGINAL HYSTERECTOMY ;  Surgeon: Rosaline Cobble, MD;  Location: [facility] [place];  Service: Gynecology;  Laterality: N/A;   Social History    Tobacco Use   Smoking status: Never   Smokeless tobacco: Never  Vaping Use   Vaping status: Never Used  Substance Use Topics   Alcohol use: No   Drug use: No   Social History   Socioeconomic History   Marital status: Married    Spouse name: Vinie   Number of children: 3   Years of education: College   Highest education level: Not on file  Occupational History   Occupation: retired  Tobacco Use   Smoking status: Never   Smokeless tobacco: Never  Vaping Use   Vaping status: Never Used  Substance and Sexual Activity   Alcohol use: No   Drug use: No   Sexual activity: Not on file  Other Topics Concern   Not on file  Social History Narrative   Pt lives at home with family.Caffeine Use: some, occasionally   Has Parkinson's   Social Drivers of Health   Financial Resource Strain: Low Risk  (06/05/2021)   Overall Financial Resource Strain (CARDIA)    Difficulty of Paying Living Expenses: Not hard at all  Food Insecurity: No Food Insecurity (01/05/2023)   Hunger Vital Sign    Worried About Running Out of Food in the Last Year: Never true    Ran Out of Food in the  Last Year: Never true  Transportation Needs: No Transportation Needs (01/05/2023)   PRAPARE - Administrator, Civil Service (Medical): No    Lack of Transportation (Non-Medical): No  Physical Activity: Inactive (06/05/2021)   Exercise Vital Sign    Days of Exercise per Week: 0 days    Minutes of Exercise per Session: 0 min  Stress: Stress Concern Present (06/05/2021)   [employer] of Occupational Health - Occupational Stress Questionnaire    Feeling of Stress : To some extent  Social Connections: Moderately Integrated (06/05/2021)   Social Connection and Isolation Panel [NHANES]    Frequency of Communication with Friends and Family: More than three times a week    Frequency of Social Gatherings with Friends and Family: Once a week    Attends Religious Services: 1 to 4 times per year    Active  Member of [employer] or Organizations: No    Attends Banker Meetings: Never    Marital Status: Married  Catering Manager Violence: Not At Risk (01/05/2023)   Humiliation, Afraid, Rape, and Kick questionnaire    Fear of Current or Ex-Partner: No    Emotionally Abused: No    Physically Abused: No    Sexually Abused: No   Family Status  Relation Name Status   Mother  Deceased   Father  Deceased   Sister  Alive   Brother  Deceased at age 43   Other  (Not Specified)       weight disorder   Son  Alive  No partnership data on file   Family History  Problem Relation Age of Onset   Heart disease Mother        enlarged heart   Cancer Father        pancreatic   COPD Sister    Pulmonary embolism Sister    Edema Sister        Ankles   Heart disease Brother    Polycystic kidney disease Son    Allergies  Allergen Reactions   Penicillins     Leg pain Did it involve swelling of the face/tongue/throat, SOB, or low BP? No Did it involve sudden or severe rash/hives, skin peeling, or any reaction on the inside of your mouth or nose? No Did you need to seek medical attention at a hospital or doctor's office? No When did it last happen?      childhood allergy If all above answers are "NO", may proceed with cephalosporin use.        Review of Systems  Constitutional:  Negative for chills, fever and malaise/fatigue.  HENT:  Negative for congestion and hearing loss.   Eyes:  Negative for blurred vision and discharge.  Respiratory:  Negative for cough, sputum production and shortness of breath.   Cardiovascular:  Negative for chest pain, palpitations and leg swelling.  Gastrointestinal:  Negative for abdominal pain, blood in stool, constipation, diarrhea, heartburn, nausea and vomiting.  Genitourinary:  Negative for dysuria, frequency, hematuria and urgency.  Musculoskeletal:  Negative for back pain and myalgias.  Skin:  Negative for rash.  Neurological:  Positive for tremors  and weakness. Negative for dizziness, sensory change, loss of consciousness and headaches.  Endo/Heme/Allergies:  Negative for environmental allergies. Does not bruise/bleed easily.  Psychiatric/Behavioral:  Positive for memory loss. Negative for depression and suicidal ideas. The patient is not nervous/anxious and does not have insomnia.       Objective:     Pulse 80   Temp 98.6  F (37 C) (Oral)   SpO2 94%  BP Readings from Last 3 Encounters:  05/03/23 (!) 100/52  02/28/23 (!) 124/58  01/16/23 (!) 103/50   Wt Readings from Last 3 Encounters:  05/03/23 130 lb (59 kg)  02/28/23 123 lb (55.8 kg)  01/16/23 132 lb (59.9 kg)   SpO2 Readings from Last 3 Encounters:  10/02/23 94%  05/03/23 96%  02/28/23 94%      Physical Exam Vitals and nursing note reviewed.  Constitutional:      General: She is not in acute distress.    Appearance: Normal appearance. She is well-developed.  HENT:     Head: Normocephalic and atraumatic.  Eyes:     General: No scleral icterus.       Right eye: No discharge.        Left eye: No discharge.  Cardiovascular:     Rate and Rhythm: Normal rate and regular rhythm.     Heart sounds: No murmur heard. Pulmonary:     Effort: Pulmonary effort is normal. No respiratory distress.     Breath sounds: Normal breath sounds.  Musculoskeletal:     Cervical back: Normal range of motion and neck supple.     Right lower leg: No edema.     Left lower leg: No edema.     Comments: Dyskinesias --  pt struggles to control Sitting in transport chair  Skin:    General: Skin is warm and dry.     Findings: Erythema and rash present. Rash is papular.       Neurological:     Mental Status: She is alert and oriented to person, place, and time.  Psychiatric:        Mood and Affect: Mood normal.        Behavior: Behavior normal.        Thought Content: Thought content normal.        Judgment: Judgment normal.      No results found for any visits on  10/02/23.  Last CBC Lab Results  Component Value Date   WBC 4.9 10/27/2022   HGB 12.4 10/27/2022   HCT 36.7 10/27/2022   MCV 93.1 10/27/2022   MCH 31.9 10/31/2018   RDW 13.8 10/27/2022   PLT 218.0 10/27/2022   Last metabolic panel Lab Results  Component Value Date   GLUCOSE 120 (H) 10/27/2022   NA 143 10/27/2022   K 4.0 10/27/2022   CL 104 10/27/2022   CO2 28 10/27/2022   BUN 18 10/27/2022   CREATININE 1.07 10/27/2022   GFR 50.84 (L) 10/27/2022   CALCIUM 9.3 10/27/2022   PROT 6.2 02/24/2022   ALBUMIN 3.9 02/24/2022   BILITOT 0.5 02/24/2022   ALKPHOS 70 02/24/2022   AST 17 02/24/2022   ALT 4 02/24/2022   ANIONGAP 11 10/31/2018   Last lipids Lab Results  Component Value Date   CHOL 183 02/24/2022   HDL 61.00 02/24/2022   LDLCALC 105 (H) 02/24/2022   TRIG 87.0 02/24/2022   CHOLHDL 3 02/24/2022   Last hemoglobin A1c No results found for: HGBA1C Last thyroid  functions Lab Results  Component Value Date   TSH 1.20 06/17/2021   T4TOTAL 5.7 12/14/2020   Last vitamin D  Lab Results  Component Value Date   VD25OH 31.40 09/26/2022   Last vitamin B12 and Folate Lab Results  Component Value Date   VITAMINB12 339 09/26/2022   FOLATE 14.9 06/02/2008      The 10-year ASCVD risk score (Arnett DK, et  al., 2019) is: 17.3%    Assessment & Plan:   Problem List Items Addressed This Visit       Unprioritized   Urinary tract infection without hematuria   Pt unable to give sample Sent pt home with Ua cup to get sample and bring back if possible If symptoms worsen---keflex  sent in for pt to take       Relevant Medications   nystatin  cream (MYCOSTATIN )   cephALEXin  (KEFLEX ) 500 MG capsule   hydrocortisone  (ANUSOL -HC) 25 MG suppository   Tinea   Refill nystatin  cream      Relevant Medications   nystatin  cream (MYCOSTATIN )   cephALEXin  (KEFLEX ) 500 MG capsule   Parkinson disease (HCC) - Primary   [facility] Pt developing hallucinations  and inc  memory problems       Abnormal urine odor   Relevant Medications   cephALEXin  (KEFLEX ) 500 MG capsule   Other Visit Diagnoses       Need for influenza vaccination       Relevant Orders   Flu Vaccine Trivalent High Dose (Fluad) (Completed)     Assessment and Plan    Urinary Tract Infection (UTI) A possible UTI is suspected due to the strong odor of urine and a history of similar symptoms, though there is no current dysuria or confusion. There is difficulty in obtaining a sterile sample at home, and catheterization may be necessary. Emphasize hydration. Prescribe 7 days of Keflex  and encourage increased water  intake. Attempt to obtain a urine sample at home for analysis.  Intertrigo Redness and irritation in the groin area are likely due to friction and moisture from briefs and pads. Regular bathing is important to prevent infections. Prescribe a larger tube of cream and encourage bathing every other day.  Constipation Intermittent constipation is managed with adequate hydration. There is no current cramping or straining. Discuss hydration and natural remedies. Encourage drinking 6-8 glasses of water  daily and suggest natural remedies like apricots and prunes.  Hemorrhoids Asymptomatic hemorrhoids are managed with OTC treatments. Use suppositories if symptoms recur. Prescribe suppositories for symptomatic relief if needed.  General Health Maintenance Regular bathing is important to prevent infections, especially with the use of briefs and pads. Administer a flu shot and encourage the use of a bidet if repairable. Suggest using a kid's sippy cup to prevent spills and maintain hydration.  Follow-up Schedule an ultrasound in radiology and report any changes in symptoms.        No follow-ups on file.    Kenji Mapel R Lowne Chase, DO

## 2023-10-02 NOTE — Assessment & Plan Note (Signed)
 Per [facility] Pt developing hallucinations  and inc memory problems

## 2023-10-02 NOTE — Assessment & Plan Note (Signed)
Refill nystatin cream

## 2023-10-02 NOTE — Assessment & Plan Note (Signed)
 Pt unable to give sample Sent pt home with Ua cup to get sample and bring back if possible If symptoms worsen---keflex sent in for pt to take

## 2023-10-03 ENCOUNTER — Other Ambulatory Visit: Payer: Self-pay | Admitting: Family Medicine

## 2023-10-03 ENCOUNTER — Other Ambulatory Visit (INDEPENDENT_AMBULATORY_CARE_PROVIDER_SITE_OTHER): Payer: Medicare Other

## 2023-10-03 DIAGNOSIS — R8290 Unspecified abnormal findings in urine: Secondary | ICD-10-CM

## 2023-10-03 LAB — POC URINALSYSI DIPSTICK (AUTOMATED)
Bilirubin, UA: NEGATIVE
Blood, UA: NEGATIVE
Glucose, UA: NEGATIVE
Nitrite, UA: POSITIVE
Protein, UA: NEGATIVE
Spec Grav, UA: 1.02 (ref 1.010–1.025)
Urobilinogen, UA: 0.2 U/dL
pH, UA: 5 (ref 5.0–8.0)

## 2023-10-05 ENCOUNTER — Emergency Department (HOSPITAL_COMMUNITY): Payer: Medicare Other

## 2023-10-05 ENCOUNTER — Emergency Department (HOSPITAL_COMMUNITY)
Admission: EM | Admit: 2023-10-05 | Discharge: 2023-10-05 | Disposition: A | Discharge status: Home / Self Care | Payer: Medicare Other | Attending: Emergency Medicine | Admitting: Emergency Medicine

## 2023-10-05 ENCOUNTER — Encounter (HOSPITAL_COMMUNITY): Payer: Self-pay

## 2023-10-05 ENCOUNTER — Other Ambulatory Visit: Payer: Self-pay

## 2023-10-05 DIAGNOSIS — Z7982 Long term (current) use of aspirin: Secondary | ICD-10-CM | POA: Insufficient documentation

## 2023-10-05 DIAGNOSIS — Z79899 Other long term (current) drug therapy: Secondary | ICD-10-CM | POA: Insufficient documentation

## 2023-10-05 DIAGNOSIS — I6782 Cerebral ischemia: Secondary | ICD-10-CM | POA: Diagnosis not present

## 2023-10-05 DIAGNOSIS — I517 Cardiomegaly: Secondary | ICD-10-CM | POA: Diagnosis not present

## 2023-10-05 DIAGNOSIS — R4182 Altered mental status, unspecified: Secondary | ICD-10-CM | POA: Diagnosis not present

## 2023-10-05 DIAGNOSIS — I959 Hypotension, unspecified: Secondary | ICD-10-CM | POA: Diagnosis not present

## 2023-10-05 DIAGNOSIS — R531 Weakness: Secondary | ICD-10-CM | POA: Diagnosis not present

## 2023-10-05 DIAGNOSIS — R059 Cough, unspecified: Secondary | ICD-10-CM | POA: Diagnosis not present

## 2023-10-05 DIAGNOSIS — R0902 Hypoxemia: Secondary | ICD-10-CM | POA: Diagnosis not present

## 2023-10-05 DIAGNOSIS — R918 Other nonspecific abnormal finding of lung field: Secondary | ICD-10-CM | POA: Diagnosis not present

## 2023-10-05 DIAGNOSIS — F0390 Unspecified dementia without behavioral disturbance: Secondary | ICD-10-CM | POA: Insufficient documentation

## 2023-10-05 DIAGNOSIS — R9431 Abnormal electrocardiogram [ECG] [EKG]: Secondary | ICD-10-CM | POA: Diagnosis not present

## 2023-10-05 DIAGNOSIS — R410 Disorientation, unspecified: Secondary | ICD-10-CM | POA: Diagnosis not present

## 2023-10-05 LAB — URINALYSIS, ROUTINE W REFLEX MICROSCOPIC
Bilirubin Urine: NEGATIVE
Glucose, UA: NEGATIVE mg/dL
Hgb urine dipstick: NEGATIVE
Ketones, ur: 5 mg/dL — AB
Leukocytes,Ua: NEGATIVE
Nitrite: NEGATIVE
Protein, ur: NEGATIVE mg/dL
Specific Gravity, Urine: 1.021 (ref 1.005–1.030)
pH: 5 (ref 5.0–8.0)

## 2023-10-05 LAB — URINE CULTURE
MICRO NUMBER:: 15952639
SPECIMEN QUALITY:: ADEQUATE

## 2023-10-05 LAB — CBC WITH DIFFERENTIAL/PLATELET
Abs Immature Granulocytes: 0.01 10*3/uL (ref 0.00–0.07)
Basophils Absolute: 0 10*3/uL (ref 0.0–0.1)
Basophils Relative: 1 %
Eosinophils Absolute: 0.1 10*3/uL (ref 0.0–0.5)
Eosinophils Relative: 2 %
HCT: 36.3 % (ref 36.0–46.0)
Hemoglobin: 12 g/dL (ref 12.0–15.0)
Immature Granulocytes: 0 %
Lymphocytes Relative: 16 %
Lymphs Abs: 0.8 10*3/uL (ref 0.7–4.0)
MCH: 31.3 pg (ref 26.0–34.0)
MCHC: 33.1 g/dL (ref 30.0–36.0)
MCV: 94.8 fL (ref 80.0–100.0)
Monocytes Absolute: 0.3 10*3/uL (ref 0.1–1.0)
Monocytes Relative: 7 %
Neutro Abs: 3.7 10*3/uL (ref 1.7–7.7)
Neutrophils Relative %: 74 %
Platelets: 236 10*3/uL (ref 150–400)
RBC: 3.83 MIL/uL — ABNORMAL LOW (ref 3.87–5.11)
RDW: 13.6 % (ref 11.5–15.5)
WBC: 5 10*3/uL (ref 4.0–10.5)
nRBC: 0 % (ref 0.0–0.2)

## 2023-10-05 LAB — COMPREHENSIVE METABOLIC PANEL
ALT: 9 U/L (ref 0–44)
AST: 23 U/L (ref 15–41)
Albumin: 3.3 g/dL — ABNORMAL LOW (ref 3.5–5.0)
Alkaline Phosphatase: 53 U/L (ref 38–126)
Anion gap: 7 (ref 5–15)
BUN: 21 mg/dL (ref 8–23)
CO2: 23 mmol/L (ref 22–32)
Calcium: 8.7 mg/dL — ABNORMAL LOW (ref 8.9–10.3)
Chloride: 108 mmol/L (ref 98–111)
Creatinine, Ser: 1.01 mg/dL — ABNORMAL HIGH (ref 0.44–1.00)
GFR, Estimated: 58 mL/min — ABNORMAL LOW (ref >60)
Glucose, Bld: 112 mg/dL — ABNORMAL HIGH (ref 70–99)
Potassium: 4.1 mmol/L (ref 3.5–5.1)
Sodium: 138 mmol/L (ref 135–145)
Total Bilirubin: 0.4 mg/dL (ref 0.0–1.2)
Total Protein: 6.1 g/dL — ABNORMAL LOW (ref 6.5–8.1)

## 2023-10-05 LAB — CBG MONITORING, ED: Glucose-Capillary: 93 mg/dL (ref 70–99)

## 2023-10-05 LAB — RAPID URINE DRUG SCREEN, HOSP PERFORMED
Amphetamines: NOT DETECTED
Barbiturates: NOT DETECTED
Benzodiazepines: NOT DETECTED
Cocaine: NOT DETECTED
Opiates: NOT DETECTED
Tetrahydrocannabinol: POSITIVE — AB

## 2023-10-05 NOTE — ED Notes (Addendum)
Pt mentioned she is left side restricted due to breast cancer and mastectomy. Left sided IV removed and new IV inserted on Right arm

## 2023-10-05 NOTE — ED Notes (Signed)
Sunquest label would not print. UA sent down with requisition papers.

## 2023-10-05 NOTE — Discharge Instructions (Signed)
There was evidence of THC in her system.  Probably from the Gummies.  Would not necessarily recommend that for somebody with Parkinson's.  Otherwise workup without any acute findings.

## 2023-10-05 NOTE — ED Provider Notes (Signed)
[place] EMERGENCY DEPARTMENT AT [facility] Provider Note   CSN: [id number] Arrival date & time: 10/05/23  1549     History  Chief Complaint  Patient presents with   Altered Mental Status    Amanda [name] is a 78 y.o. female.  Patient's daughter is here.  They noted that she seemed to be having some strange behavior staring off and corners strange facial configurations.  And they were actually worried that she may have had a stroke.  It is possible at the approval of her husband and son that maybe she was given a a gummy with THC in it.  But not clear.  Patient is back to baseline according to the daughter currently.  That is reassuring.  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  She is wheelchair-bound.  Surgical history significant for abdominal hysterectomy.  And a left mastectomy in 2018 patient is never used tobacco products.  Patient's blood pressure here 119/63 oxygen sats 100% temp 97.7 pulse 59 respirations 18.       Home Medications Prior to Admission medications   Medication Sig Start Date End Date Taking? Authorizing Provider  AMBULATORY NON FORMULARY MEDICATION Medication Name: Compression Stockings 20-36mmHg  1 pair 12/29/17   Zola Button, Grayling Congress, DO  aspirin EC 325 MG tablet Take 325 mg by mouth daily as needed for moderate pain.    [provider]  b complex vitamins tablet Take 1 tablet by mouth daily. MEGA FOOD BALANCE SOURCE    [provider]  betamethasone valerate ointment (VALISONE) 0.1 % Apply 1 Application topically 2 (two) times daily. 01/16/23   [name], NP  CALCIUM-MAGNESIUM-VITAMIN D PO Take 2 tablets by mouth 2 (two) times daily.    [provider]  carbidopa-levodopa (SINEMET IR) 25-100 MG tablet Take 1 tablet by mouth 3 (three) times daily. 1 whole in morning and 1/2 tablet every 3 hours     [provider]  cephALEXin (KEFLEX) 500 MG capsule Take 1 capsule (500 mg total) by mouth 3 (three) times daily. 06/05/23   [name], NP  cephALEXin (KEFLEX) 500 MG capsule Take 1 capsule (500 mg total) by mouth 2 (two) times daily. 10/02/23   Donato Schultz, DO  Cholecalciferol (VITAMIN D3) 1000 UNIT/SPRAY LIQD Take 2 sprays by mouth 2 (two) times daily.    [provider]  famotidine (PEPCID) 20 MG tablet TAKE 1 TABLET BY MOUTH TWICE A DAY 03/23/21   Zola Button, Grayling Congress, DO  hydrocortisone (ANUSOL-HC) 25 MG suppository Place 1 suppository (25 mg total) rectally 2 (two) times daily. 10/02/23   Donato Schultz, DO  mirabegron ER (MYRBETRIQ) 25 MG TB24 tablet Take 1 tablet (25 mg total) by mouth daily. 09/25/23   Seabron Spates R, DO  nystatin (MYCOSTATIN/NYSTOP) powder Apply 1 Application topically 3 (three) times daily. 01/16/23   [name], NP  nystatin cream (MYCOSTATIN) Apply 1 Application topically 2 (two) times daily. 10/02/23   Donato Schultz, DO  Probiotic Product (PROBIOTIC DAILY) CAPS Take 1 capsule by mouth daily as needed (indigestion).    [provider]  Propylene Glycol-Glycerin (SOOTHE OP) Place 2 drops into both eyes daily as needed (dry eyes).    [provider]  rivastigmine (EXELON) 4.6 mg/24hr APPLY 1 PATCH TOPICALLY ONCE DAILY 05/16/22   [provider]  sertraline (ZOLOFT) 50 MG  tablet Take 1 tablet (50 mg total) by mouth at bedtime. 05/12/22   Donato Schultz, DO      Allergies    Penicillins    Review of Systems   Review of Systems  Unable to perform ROS: Mental status change    Physical Exam Updated Vital Signs BP 119/63 (BP Location: Left Arm)   Pulse (!) 59   Temp 97.7 F (36.5 C) (Oral)   Resp 18   Ht 1.626 m (5\' 4" )   Wt 59 kg   SpO2 100%   BMI 22.31 kg/m  Physical Exam Vitals and nursing note reviewed.  Constitutional:      General: She is not in acute  distress.    Appearance: Normal appearance. She is well-developed. She is not ill-appearing.  HENT:     Head: Normocephalic and atraumatic.     Mouth/Throat:     Mouth: Mucous membranes are moist.  Eyes:     Extraocular Movements: Extraocular movements intact.     Conjunctiva/sclera: Conjunctivae normal.     Pupils: Pupils are equal, round, and reactive to light.  Cardiovascular:     Rate and Rhythm: Normal rate and regular rhythm.     Heart sounds: No murmur heard. Pulmonary:     Effort: Pulmonary effort is normal. No respiratory distress.     Breath sounds: Normal breath sounds.  Abdominal:     Palpations: Abdomen is soft.     Tenderness: There is no abdominal tenderness.  Musculoskeletal:        General: No swelling.     Cervical back: Neck supple. No rigidity.  Skin:    General: Skin is warm and dry.     Capillary Refill: Capillary refill takes less than 2 seconds.  Neurological:     General: No focal deficit present.     Mental Status: She is alert. Mental status is at baseline.     Comments: Patient able to move all extremities.  Baseline weakness.  Psychiatric:        Mood and Affect: Mood normal.     ED Results / Procedures / Treatments   Labs (all labs ordered are listed, but only abnormal results are displayed) Labs Reviewed  CBC WITH DIFFERENTIAL/PLATELET  COMPREHENSIVE METABOLIC PANEL  URINALYSIS, ROUTINE W REFLEX MICROSCOPIC  RAPID URINE DRUG SCREEN, HOSP PERFORMED  CBG MONITORING, ED    EKG EKG Interpretation Date/Time:  Thursday October 05 2023 15:54:30 EST Ventricular Rate:  65 PR Interval:  113 QRS Duration:  97 QT Interval:  452 QTC Calculation: 470 R Axis:   75  Text Interpretation: Sinus rhythm Borderline short PR interval Borderline T abnormalities, anterior leads Confirmed by Vanetta Mulders [phone]) on 10/05/2023 4:25:03 PM  Radiology No results found.  Procedures Procedures    Medications Ordered in ED Medications - No data to  display  ED Course/ Medical Decision Making/ A&P                                 Medical Decision Making Amount and/or Complexity of Data Reviewed Labs: ordered. Radiology: ordered.   Patient seems to be back to baseline.  Will throw kind of a broad net to include checking urine checking labs and head CT.  As well as chest x-ray.  Will do urine drug screen to see if that shows any abnormalities.  Patient's urinalysis not consistent with urinary tract infection.  Urine drug screen was  positive for THC so this is probably secondary to the Gummies.  Sounds as if maybe husband and/or son have been giving her this.  Daughter will have a conversation with family.  This may not be the best thing for her to be taking with her Parkinson's disease.  CBC white count 5 hemoglobin 12 platelets 226 complete metabolic panel without any acute abnormalities renal function normal LFTs normal blood sugar was 93 CT head without any acute findings chest x-ray with some mild blunting possible small pleural effusion versus atelectasis.  But patient not in any respiratory distress.  Probably the events are best feeling by the [facility].  Patient is now back to baseline according to daughter.  And is well enough to go home.   Final Clinical Impression(s) / ED Diagnoses Final diagnoses:  Altered mental status, unspecified altered mental status type    Rx / DC Orders ED Discharge Orders     None         Vanetta Mulders, MD 10/05/23 2243

## 2023-10-05 NOTE — ED Notes (Signed)
Pt to CT
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
## 2023-10-05 NOTE — ED Notes (Signed)
Discharge instructions re-reviewed with support person.   Opportunity for questions or concerns provided.   Discouraged use of gummies with history of parkinson's disease.   Alert, and escorted to family vehicle. Displays no signs of distress.
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
## 2023-10-05 NOTE — ED Notes (Signed)
 CCMD called.

## 2023-10-06 ENCOUNTER — Telehealth: Payer: Self-pay | Admitting: *Deleted

## 2023-10-06 NOTE — Transitions of Care (Post Inpatient/ED Visit) (Signed)
   10/06/2023  Name: Amanda [name] MRN: [id number] DOB: [birth year]  Today's TOC FU Call Status: Today's TOC FU Call Status:: Unsuccessful Call (1st Attempt) Unsuccessful Call (1st Attempt) Date: 10/06/23  Attempted to reach the patient regarding the most recent Inpatient/ED visit.  Follow Up Plan: Additional outreach attempts will be made to reach the patient to complete the Transitions of Care (Post Inpatient/ED visit) call.   Signature Jesica Goheen, [facility]

## 2023-10-06 NOTE — Progress Notes (Signed)
CSW received call from [place] at [facility] DSS who states the report was accepted and is assigned to her. Laquita to follow up with patient and family directly.  Edwin Dada, MSW, LCSW Transitions of Care  Clinical Social Worker II [phone]

## 2023-10-17 ENCOUNTER — Telehealth: Payer: Self-pay
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
## 2023-10-18 NOTE — Telephone Encounter (Signed)
Returned call. Made DSS aware

## 2023-10-20 ENCOUNTER — Other Ambulatory Visit: Payer: Self-pay | Admitting: Family Medicine

## 2023-10-20 ENCOUNTER — Telehealth: Payer: Self-pay

## 2023-10-20 DIAGNOSIS — Z20828 Contact with and (suspected) exposure to other viral communicable diseases: Secondary | ICD-10-CM

## 2023-10-20 MED ORDER — OSELTAMIVIR PHOSPHATE 75 MG PO CAPS: 75.0000 mg | ORAL_CAPSULE | Freq: Every day | ORAL | 0 refills | Status: AC

## 2023-10-20 NOTE — Telephone Encounter (Signed)
Called pt's daughter. LDVM

## 2023-10-20 NOTE — Telephone Encounter (Signed)
Copied from CRM [id number]. Topic: Clinical - Medication Question >> Oct 20, 2023 10:58 AM Denese Killings wrote: Reason for CRM: Patient home health aid was diagnosed with the flu and her daughter wants to know if patient can get Tamiflu called in as a preventative. She states that their aid comes back tomorrow.

## 2023-10-20 NOTE — Telephone Encounter (Signed)
 Opened new encounter

## 2023-10-20 NOTE — Telephone Encounter (Signed)
[name], Amanda L  CR   10/20/23 11:00 AM Unsigned Note Copied from CRM [id number]. Topic: Clinical - Medication Question >> Oct 20, 2023 10:58 AM Denese Killings wrote: Reason for CRM: Patient home health aid was diagnosed with the flu and her daughter wants to know if patient can get Tamiflu called in as a preventative. She states that their aid comes back tomorrow.

## 2023-10-24 ENCOUNTER — Ambulatory Visit: Payer: Self-pay | Admitting: Family Medicine

## 2023-10-24 NOTE — Telephone Encounter (Signed)
 Copied from CRM [id number]. Topic: Clinical - Red Word Triage >> Oct 24, 2023 10:45 AM [name] wrote: Red Word that prompted transfer to Nurse Triage: Pt husband called and stated that his wife has a rash on her back and it's itching and burning and doesn't know if it could be shingles. She has had the rash for about 10 days now.  Chief Complaint: rash to back left side and chest hurts on left side, hx of mastectomy. Symptoms: red, itchy rash to back, left chest pain Frequency: constant Pertinent Negatives: Patient denies fever, chills.  Disposition: [] ED /[x] Urgent Care (no appt availability in office) / [] Appointment(In office/virtual)/ []  [place] [facility]/ [] Home Care/ [] Refused Recommended Disposition /[] [place] [facility]/ []  Follow-up with PCP Additional Notes: states rash started about 10 days ago.  Husband states it itches; she denies pain.  Instructed to take her to UC for treatment.  PCP office updated.  Reason for Disposition  [1] Shingles rash (matches SYMPTOMS) AND [2] onset < 72 hours ago (3 days)  Answer Assessment - Initial Assessment Questions 1. APPEARANCE of RASH: Describe the rash.      Streak like, 1.5 inches wide and 8-10 inches long 2. LOCATION: Where is the rash located?      On back 3. NUMBER: How many spots are there?      na 4. SIZE: How big are the spots? (Inches, centimeters or compare to size of a coin)      1.5 x 8-10 inches long, looks blistery. 5. ONSET: When did the rash start?      10 days ago 6. ITCHING: Does the rash itch? If Yes, ask: How bad is the itch?  (Scale 0-10; or none, mild, moderate, severe)     itches 7. PAIN: Does the rash hurt? If Yes, ask: How bad is the pain?  (Scale 0-10; or none, mild, moderate, severe)    - NONE (0): no pain    - MILD (1-3): doesn't interfere with normal activities     - MODERATE (4-7): interferes with normal activities or awakens from sleep     - SEVERE (8-10): excruciating pain,  unable to do any normal activities     Denies. 8. OTHER SYMPTOMS: Do you have any other symptoms? (e.g., fever)     denies 9. PREGNANCY: Is there any chance you are pregnant? When was your last menstrual period?     na  Protocols used: Rash or Redness - Localized-A-AH, Shingles (Zoster)-A-AH

## 2023-10-24 NOTE — Telephone Encounter (Signed)
FYI. Pt triaged to UC

## 2023-10-25 ENCOUNTER — Encounter: Payer: Self-pay | Admitting: Family Medicine

## 2023-10-25 ENCOUNTER — Ambulatory Visit: Payer: Medicare Other | Admitting: Family Medicine

## 2023-10-25 ENCOUNTER — Ambulatory Visit: Payer: Self-pay | Admitting: Family Medicine

## 2023-10-25 VITALS — BP 103/69 | HR 70 | Temp 97.8°F

## 2023-10-25 DIAGNOSIS — B029 Zoster without complications: Secondary | ICD-10-CM

## 2023-10-25 NOTE — Progress Notes (Signed)
 OFFICE VISIT  10/25/2023  CC:  Chief Complaint  Patient presents with   Rash    On Sunday, left back with discomfort on left side of chest (had mastectomy)    Patient is a 78 y.o. female who presents accompanied by a female caregiver for rash.  HPI: Rash noted by caregiver when helping her change her shirt 4 nights ago, small pinkish blisters on left thoracic spine region. Tingling sensation felt but no significant burning or other pain. Caregiver noted a few more lesions yesterday, a bit more in the left posterior axillary line. Has had some tingling sensation wrapping around over the left chest wall but no lesions in that area. No fever, chills, or malaise.  No history of herpes zoster. She has not had Shingrix.   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   COLONOSCOPY  last one 2007   CYSTOCELE REPAIR N/A 01/11/2016   Procedure: ANTERIOR REPAIR (CYSTOCELE) COLOPLAST FASCIA  SACROSPINOUS FIXATION;  Surgeon: Arlena Gal, MD;  Location: [facility];  Service: Urology;  Laterality: N/A;   CYSTOSCOPY W/ URETERAL STENT PLACEMENT Bilateral 01/11/2016   Procedure: CYSTOSCOPY WITH BILATERAL RETROGRADE PYELOGRAM/URETERAL STENT PLACEMENT;  Surgeon: Arlena Gal, MD;  Location: [facility];  Service: Urology;  Laterality: Bilateral;   ESOPHAGOGASTRODUODENOSCOPY  2007   EXTERNAL EAR SURGERY Bilateral age 89   LAPAROSCOPIC OVARIAN CYSTECTOMY  March 2008   unilateral   MASTECTOMY Left 2018    PUBOVAGINAL SLING N/A 01/11/2016   Procedure:  ALTIS SLING SINGLE INCISION;  Surgeon: Arlena Gal, MD;  Location: [facility];  Service: Urology;  Laterality: N/A;   RECTOCELE REPAIR N/A 01/11/2016   Procedure: POSTERIOR REPAIR (RECTOCELE), PERINEAPLASTY;  Surgeon: Rosaline Cobble, MD;  Location: [facility];  Service: Gynecology;  Laterality: N/A;   SIMPLE MASTECTOMY WITH AXILLARY SENTINEL NODE BIOPSY Left 11/08/2018   Procedure: LEFT SIMPLE MASTECTOMY WITH LEFT  AXILLARY SENTINEL NODE MAPPING;  Surgeon: Vanderbilt Ned, MD;  Location: [facility];  Service: General;  Laterality: Left;   VAGINAL HYSTERECTOMY N/A 01/11/2016   Procedure: TOTAL VAGINAL HYSTERECTOMY ;  Surgeon: Rosaline Cobble, MD;  Location: [facility] [place];  Service: Gynecology;  Laterality: N/A;    Outpatient Medications Prior to Visit  Medication Sig Dispense Refill   acetaminophen  (TYLENOL ) 500 MG tablet Take 500 mg by mouth every 6 (six) hours as needed for fever or mild pain (pain score 1-3).     aspirin EC 325 MG tablet Take 325 mg by mouth daily as needed for moderate pain.     b complex vitamins tablet Take 1 tablet by mouth daily. MEGA FOOD BALANCE SOURCE     CALCIUM-MAGNESIUM-VITAMIN D  PO Take 2 tablets by mouth 2 (two) times daily.     carbidopa -levodopa  (SINEMET  IR) 25-100 MG tablet Take 1 tablet by mouth 3 (three) times daily. 1 whole in morning and 1/2 tablet every 3 hours  cephALEXin  (KEFLEX ) 500 MG capsule Take 1 capsule (500 mg total) by mouth 2 (two) times daily. 20 capsule 0   famotidine  (PEPCID ) 20 MG tablet TAKE 1 TABLET BY MOUTH TWICE A DAY 180 tablet 1   hydrocortisone  (ANUSOL -HC) 25 MG suppository Place 1 suppository (25 mg total) rectally 2 (two) times daily. 24 suppository 0   ibuprofen (ADVIL) 200 MG tablet Take 200-400 mg by mouth every 6 (six) hours as needed for headache or mild pain (pain score 1-3).     melatonin 3 MG TABS tablet Take 3 mg by mouth at  bedtime as needed.     mirabegron  ER (MYRBETRIQ ) 25 MG TB24 tablet Take 1 tablet (25 mg total) by mouth daily. 90 tablet 1   nystatin  cream (MYCOSTATIN ) Apply 1 Application topically 2 (two) times daily.     nystatin  powder Apply 1 Application topically 3 (three) times daily.     oseltamivir  (TAMIFLU ) 75 MG capsule Take 1 capsule (75 mg total) by mouth daily. 10 capsule 0   pramipexole  (MIRAPEX ) 0.75 MG tablet Take 0.375 mg by mouth 3 (three) times daily.     Probiotic Product (PROBIOTIC DAILY) CAPS Take 1 capsule by mouth daily as needed (indigestion).     Propylene Glycol-Glycerin (SOOTHE OP) Place 2 drops into both eyes daily as needed (dry eyes).     rivastigmine (EXELON) 1.5 MG capsule Take 1.5 mg by mouth 2 (two) times daily.     sertraline  (ZOLOFT ) 50 MG tablet Take 1 tablet (50 mg total) by mouth at bedtime. (Patient taking differently: Take 100 mg by mouth at bedtime.) 90 tablet 3   No facility-administered medications prior to visit.    Allergies  Allergen Reactions   Penicillins     Leg pain Did it involve swelling of the face/tongue/throat, SOB, or low BP? No Did it involve sudden or severe rash/hives, skin peeling, or any reaction on the inside of your mouth or nose? No Did you need to seek medical attention at a hospital or doctor's office? No When did it last happen?      childhood allergy If all above answers are "NO", may proceed with cephalosporin use.      Review of Systems  As per HPI  PE:    10/25/2023    3:39 PM 10/05/2023   10:00 PM 10/05/2023    9:30 PM  Vitals with BMI  Systolic 103 142 857  Diastolic 69 81 65  Pulse 70 56 60     Physical Exam  Gen: Alert, well appearing.  Patient is oriented to person, place, time, and situation. Left upper thoracic region with small dried papules on mildly erythematous background.  LABS:  Last CBC Lab Results  Component Value Date   WBC 5.0 10/05/2023   HGB 12.0 10/05/2023   HCT 36.3 10/05/2023   MCV 94.8  10/05/2023   MCH 31.3 10/05/2023   RDW 13.6 10/05/2023   PLT 236 10/05/2023     Chemistry      Component Value Date/Time   NA 138 10/05/2023 1821   K 4.1 10/05/2023 1821   CL 108 10/05/2023 1821   CO2 23 10/05/2023 1821   BUN 21 10/05/2023 1821   CREATININE 1.01 (H) 10/05/2023 1821      Component Value Date/Time   CALCIUM 8.7 (L) 10/05/2023 1821   ALKPHOS 53 10/05/2023 1821   AST 23 10/05/2023 1821   ALT 9 10/05/2023 1821   BILITOT 0.4 10/05/2023 1821     IMPRESSION AND  PLAN:  Herpes zoster without complication. Fortunately her pain is minimal, mostly a tingling sensation. She is not within the window for antiviral use.  General symptomatic care discussed.  Typical disease course discussed.  Signs of bacterial superinfection discussed.  Signs/symptoms to call or return for were reviewed and pt and caregiver expressed understanding.   An After Visit Summary was printed and given to the patient.  FOLLOW UP: Return if symptoms worsen or fail to improve.  Signed:  Gerlene Hockey, MD           10/25/2023

## 2023-10-25 NOTE — Telephone Encounter (Signed)
 Noted.

## 2023-10-25 NOTE — Telephone Encounter (Signed)
 Noted, has appointment today with Dr.McGowen

## 2023-10-25 NOTE — Telephone Encounter (Signed)
  Chief Complaint: rash Symptoms: left back red raised, blistered rash possibly shingles Frequency: x 3 days Pertinent Negatives: Patient denies fever Disposition: [] ED /[] Urgent Care (no appt availability in office) / [x] Appointment(In office/virtual)/ []  [place] [facility]/ [] Home Care/ [] Refused Recommended Disposition /[] [facility] [place] [facility]/ []  Follow-up with PCP Additional Notes: Patient's daughter, [name], on phone for triage. She states she sent in photos thru mychart of patient's rash. She states the caregiver for her mother noticed the rash on Sunday. Daughter reports patient c/o itching and pain with rash. She states her father is immunocompromised, advised that he avoid contact with the patient. Per daughter's request patient scheduled for office visit today with Dr Candise.  Copied From CRM [id number]. Reason for Triage: [name] - daughter states patient has shingles, experiencing blisters on half her back and some of they may have popped which is causing it to look a little bit like scabs. Also experiencing a little bit of pain and a tingling sensation.  Callback number: [phone]   Reason for Disposition  [1] Shingles rash (matches SYMPTOMS) AND [2] onset < 72 hours ago (3 days)  Answer Assessment - Initial Assessment Questions 1. APPEARANCE of RASH: Describe the rash.      Red blistered, raised bumps and some look like they have opened and crusted.  2. LOCATION: Where is the rash located?      Left side of back.  3. ONSET: When did the rash start?      Sunday.  4. ITCHING: Does the rash itch? If Yes, ask: How bad is the itch?  (Scale 1-10; or mild, moderate, severe)     Daughter states some itching but has not noted patient complaining of itching too severe.  5. PAIN: Does the rash hurt? If Yes, ask: How bad is the pain?  (Scale 0-10; or none, mild, moderate, severe)    - NONE (0): no pain    - MILD (1-3): doesn't interfere with normal activities      - MODERATE (4-7): interferes with normal activities or awakens from sleep     - SEVERE (8-10): excruciating pain, unable to do any normal activities     Mild pain.  6. OTHER SYMPTOMS: Do you have any other symptoms? (e.g., fever)     Tingling pain to left chest.  Protocols used: Shingles (Zoster)-A-AH

## 2023-10-25 NOTE — Patient Instructions (Addendum)
 You have shingles. 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 It is not contagious but it is best to avoid direct contact with open blisters, especially without gloves.   Immunosuppressed people are safe to be around you.  Take tylenol  500 mg every 6 hours as needed for pain. Do not apply any lotions or creams. Warm moist compress is ok to use.  You may continue to get some spots over the next few days.  This will gradually fade away over the next 2-3 wks.\  You will need to have the new shingles vaccine (shingrix) completed no sooner than 3 months

## 2023-10-30 ENCOUNTER — Encounter: Payer: Medicare Other | Admitting: Podiatry

## 2023-10-30 NOTE — Progress Notes (Signed)
Patient did not show for her scheduled appointment today.

## 2023-11-14 ENCOUNTER — Encounter: Payer: Self-pay | Admitting: Family Medicine

## 2023-11-14 ENCOUNTER — Other Ambulatory Visit: Payer: Self-pay | Admitting: Family Medicine

## 2023-11-14 DIAGNOSIS — R8290 Unspecified abnormal findings in urine: Secondary | ICD-10-CM

## 2023-11-14 DIAGNOSIS — Z7409 Other reduced mobility: Secondary | ICD-10-CM | POA: Diagnosis not present

## 2023-11-14 DIAGNOSIS — G20A1 Parkinson's disease without dyskinesia, without mention of fluctuations: Secondary | ICD-10-CM | POA: Diagnosis not present

## 2023-11-14 NOTE — Telephone Encounter (Signed)
Pt needs lab appt please 

## 2023-11-15 ENCOUNTER — Other Ambulatory Visit: Payer: Medicare Other

## 2023-11-17 ENCOUNTER — Other Ambulatory Visit (INDEPENDENT_AMBULATORY_CARE_PROVIDER_SITE_OTHER): Payer: Medicare Other

## 2023-11-17 DIAGNOSIS — R8290 Unspecified abnormal findings in urine: Secondary | ICD-10-CM | POA: Diagnosis not present

## 2023-11-17 LAB — POC URINALSYSI DIPSTICK (AUTOMATED)
Blood, UA: NEGATIVE
Glucose, UA: NEGATIVE
Nitrite, UA: POSITIVE
Protein, UA: NEGATIVE
Spec Grav, UA: 1.015 (ref 1.010–1.025)
Urobilinogen, UA: 0.2 U/dL
pH, UA: 6 (ref 5.0–8.0)

## 2023-11-19 LAB — URINE CULTURE
MICRO NUMBER:: 16143833
SPECIMEN QUALITY:: ADEQUATE

## 2023-11-20 ENCOUNTER — Ambulatory Visit: Payer: Self-pay | Admitting: Family Medicine

## 2023-11-20 NOTE — Telephone Encounter (Signed)
FYI. Pt has appt tomorrow.  

## 2023-11-20 NOTE — Telephone Encounter (Signed)
 Chief Complaint: Urinary symptoms Symptoms: Foul odor, increased urgency, delirium Frequency: 1 week Pertinent Negatives: Patient denies n/a Disposition: [] ED /[] Urgent Care (no appt availability in office) / [x] Appointment(In office/virtual)/ []  [place] [facility]/ [] Home Care/ [] Refused Recommended Disposition /[] [facility]/ []  Follow-up with PCP Additional Notes: Patient's daughter called in stating she would like to know if antibiotic is needed for patient's urine culture/symptoms. Patient is experiencing foul odor of urine, increased urgency, and more confusion. Patient's daughter uncertain if patient is experiencing burning with urination since patient is incontinent, and uncertain of fever. Patient appt made for tomorrow for evaluation and to determine if abx are needed. Patient's daughter states she believes patient has been battling this infection for a few weeks on and off.   Reason for Disposition  Urinating more frequently than usual (i.e., frequency)  Answer Assessment - Initial Assessment Questions 1. SYMPTOM: "What's the main symptom you're concerned about?" (e.g., frequency, incontinence)     Odor, urgency, not feeling well 2. ONSET: "When did the  symptoms  start?"     A week 3. PAIN: "Is there any pain?" If Yes, ask: "How bad is it?" (Scale: 1-10; mild, moderate, severe)     Daughter is uncertain 4. CAUSE: "What do you think is causing the symptoms?"     Believed to be UTI - Urine sample sent in 5. OTHER SYMPTOMS: "Do you have any other symptoms?" (e.g., blood in urine, fever, flank pain, pain with urination)     Delusions, incontinence  Protocols used: Urinary Symptoms-A-AH

## 2023-11-20 NOTE — Telephone Encounter (Signed)
 1st attempt-voicemail left for patient to call back. per chart review, patient may need an appointment to see PCP. Will place in callback folder   Copied from CRM [id number]. Topic: Clinical - Medication Question >> Nov 20, 2023  2:02 PM Florestine Avers wrote: Reason for CRM: Patients daughter called on behalf of the patient to inquire if she was supposed to be prescribed UTI medication. Requesting a callback.

## 2023-11-21 ENCOUNTER — Other Ambulatory Visit (HOSPITAL_BASED_OUTPATIENT_CLINIC_OR_DEPARTMENT_OTHER): Payer: Self-pay

## 2023-11-21 ENCOUNTER — Other Ambulatory Visit: Payer: Self-pay | Admitting: Family Medicine

## 2023-11-21 ENCOUNTER — Encounter: Payer: Self-pay | Admitting: Family Medicine

## 2023-11-21 ENCOUNTER — Ambulatory Visit: Admitting: Family Medicine

## 2023-11-21 VITALS — BP 120/60 | HR 80 | Temp 97.7°F | Resp 18 | Ht 64.0 in

## 2023-11-21 DIAGNOSIS — G20A1 Parkinson's disease without dyskinesia, without mention of fluctuations: Secondary | ICD-10-CM

## 2023-11-21 DIAGNOSIS — N390 Urinary tract infection, site not specified: Secondary | ICD-10-CM | POA: Diagnosis not present

## 2023-11-21 MED ORDER — CEFTRIAXONE SODIUM 1 G IJ SOLR
1.0000 g | Freq: Once | INTRAMUSCULAR | Status: AC
  Administered 2023-11-21: 1 g via INTRAMUSCULAR

## 2023-11-21 MED ORDER — CIPROFLOXACIN HCL 500 MG PO TABS
500.0000 mg | ORAL_TABLET | Freq: Two times a day (BID) | ORAL | 0 refills | Status: AC
  Filled 2023-11-21: qty 10, 5d supply, fill #0

## 2023-11-21 NOTE — Progress Notes (Unsigned)
 Established Patient Office Visit  Subjective   Patient ID: Amanda [name], female    DOB: [birth year]  Age: 78 y.o. MRN: [id number]  Chief Complaint  Patient presents with   Urinary Tract Infection    HPI Discussed the use of AI scribe software for clinical note transcription with the patient, who gave verbal consent to proceed.  History of Present Illness   Amanda [name] is a 78 year old female with recurrent urinary tract infections who presents with concerns of a possible kidney infection. She is accompanied by her caregiver.  She has a history of recurrent urinary tract infections, which seem to exacerbate her dementia symptoms. Her caregiver notes that changes in her behavior, such as becoming 'not so sweet,' often indicate an underlying issue, likely a UTI. Her current care situation involves being left in a brief and two pads for twelve hours overnight, which may contribute to recurrent UTIs. Prolonged exposure to urine and feces is suspected to cause bacterial infections, specifically E. coli, which was identified in a previous infection. Her caregiver has been advocating for a third shift caregiver to assist overnight to prevent these issues.  Her urinary needs are managed by encouraging fluid intake and assisting her to the bathroom after taking her medications. However, due to her Parkinson's disease, she has difficulty remaining still, complicating the process of obtaining sterile urine samples. Her cat helps keep her calm and still at times.  She has a history of tonsillitis and reports significant allergy symptoms, including sneezing, nasal congestion, and an itchy throat, particularly during this time of year. She experiences difficulty breathing at night, waking up sweating, and has a history of a low immune system, which affects her healing process.      Patient Active Problem List   Diagnosis Date Noted   Abnormal urine odor 10/02/2023   Tinea 10/02/2023   Urinary  tract infection without hematuria 10/02/2023   Acute right flank pain 05/03/2023   Skin rash 01/16/2023   Olecranon bursitis of right elbow 01/16/2023   Dysuria 09/26/2022   Generalized anxiety disorder 06/02/2022   Osteoporosis 06/02/2022   Screening, ischemic heart disease 02/21/2022   Urinary incontinence 02/21/2022   Frequent falls 02/21/2022   Acute right ankle pain 06/17/2021   Drug-induced polyneuropathy (HCC) 12/14/2020   Diarrhea 12/14/2020   Incontinence in female 12/14/2020   Dyspepsia 12/14/2020   Bunion 08/19/2019   Hammer toe 08/19/2019   Breast cancer, stage 2, left (HCC) 11/08/2018   Flexural eczema 05/13/2018   Malignant neoplasm of lower-outer quadrant of left breast of female, estrogen receptor positive (HCC) 08/14/2017   Edema, lower extremity 05/02/2017   Insect sting 05/09/2016   Prolapse of female pelvic organs 01/11/2016   Biceps tendinitis on left 02/18/2014   Parkinson disease (HCC) 10/03/2012   Sinusitis 11/09/2011   Anxiety 06/03/2009   OVARIAN CYST 06/02/2008   Osteopenia 06/02/2008   FATIGUE 06/02/2008   Past Medical History:  Diagnosis Date   Breast cancer, left breast (HCC)    Frequency of urination    GAD (generalized anxiety disorder)    Osteopenia    Parkinson disease (HCC)    neurologist-  dr Waunita Schooner--  right side predominant   Parkinson disease (HCC)    Prolapse of vaginal wall    Urgency of urination    Wears glasses    Past Surgical History:  Procedure Laterality Date   ABDOMINAL HYSTERECTOMY     BREAST BIOPSY Left 07/2017   Grade II  Invasive and In Situ Carcinoma w/Calcs   COLONOSCOPY  last one 2007   CYSTOCELE REPAIR N/A 01/11/2016   Procedure: ANTERIOR REPAIR (CYSTOCELE) COLOPLAST FASCIA  SACROSPINOUS FIXATION;  Surgeon: Jethro Bolus, MD;  Location: [facility];  Service: Urology;  Laterality: N/A;   CYSTOSCOPY W/ URETERAL STENT PLACEMENT Bilateral 01/11/2016   Procedure: CYSTOSCOPY WITH BILATERAL  RETROGRADE PYELOGRAM/URETERAL STENT PLACEMENT;  Surgeon: Jethro Bolus, MD;  Location: [facility];  Service: Urology;  Laterality: Bilateral;   ESOPHAGOGASTRODUODENOSCOPY  2007   EXTERNAL EAR SURGERY Bilateral age 11   LAPAROSCOPIC OVARIAN CYSTECTOMY  March 2008   unilateral   MASTECTOMY Left 2018   PUBOVAGINAL SLING N/A 01/11/2016   Procedure:  ALTIS SLING SINGLE INCISION;  Surgeon: Jethro Bolus, MD;  Location: [facility];  Service: Urology;  Laterality: N/A;   RECTOCELE REPAIR N/A 01/11/2016   Procedure: POSTERIOR REPAIR (RECTOCELE), PERINEAPLASTY;  Surgeon: Marcelle Overlie, MD;  Location: [facility];  Service: Gynecology;  Laterality: N/A;   SIMPLE MASTECTOMY WITH AXILLARY SENTINEL NODE BIOPSY Left 11/08/2018   Procedure: LEFT SIMPLE MASTECTOMY WITH LEFT  AXILLARY SENTINEL NODE MAPPING;  Surgeon: Harriette Bouillon, MD;  Location: [facility];  Service: General;  Laterality: Left;   VAGINAL HYSTERECTOMY N/A 01/11/2016   Procedure: TOTAL VAGINAL HYSTERECTOMY ;  Surgeon: Marcelle Overlie, MD;  Location: [facility];  Service: Gynecology;  Laterality: N/A;   Social History   Tobacco Use   Smoking status: Never   Smokeless tobacco: Never  Vaping Use   Vaping status: Never Used  Substance Use Topics   Alcohol use: No   Drug use: No   Social History   Socioeconomic History   Marital status: Married    Spouse name: Iantha Fallen   Number of children: 3   Years of education: College   Highest education level: Not on file  Occupational History   Occupation: retired  Tobacco Use   Smoking status: Never   Smokeless tobacco: Never  Vaping Use   Vaping status: Never Used  Substance and Sexual Activity   Alcohol use: No   Drug use: No   Sexual activity: Not on file  Other Topics Concern   Not on file  Social History Narrative   Pt lives at home with family.Caffeine Use: some, occasionally   Has Parkinson's   Social Drivers  of Health   Financial Resource Strain: Low Risk  (06/05/2021)   Overall Financial Resource Strain (CARDIA)    Difficulty of Paying Living Expenses: Not hard at all  Food Insecurity: No Food Insecurity (01/05/2023)   Hunger Vital Sign    Worried About Running Out of Food in the Last Year: Never true    Ran Out of Food in the Last Year: Never true  Transportation Needs: No Transportation Needs (01/05/2023)   PRAPARE - Administrator, Civil Service (Medical): No    Lack of Transportation (Non-Medical): No  Physical Activity: Inactive (06/05/2021)   Exercise Vital Sign    Days of Exercise per Week: 0 days    Minutes of Exercise per Session: 0 min  Stress: Stress Concern Present (06/05/2021)   [employer] of Occupational Health - Occupational Stress Questionnaire    Feeling of Stress : To some extent  Social Connections: Moderately Integrated (06/05/2021)   Social Connection and Isolation Panel [NHANES]    Frequency of Communication with Friends and Family: More than three times a week    Frequency of Social Gatherings with  Friends and Family: Once a week    Attends Religious Services: 1 to 4 times per year    Active Member of [employer] or Organizations: No    Attends Banker Meetings: Never    Marital Status: Married  Catering manager Violence: Not At Risk (01/05/2023)   Humiliation, Afraid, Rape, and Kick questionnaire    Fear of Current or Ex-Partner: No    Emotionally Abused: No    Physically Abused: No    Sexually Abused: No   Family Status  Relation Name Status   Mother  Deceased   Father  Deceased   Sister  Alive   Brother  Deceased at age 63   Other  (Not Specified)       weight disorder   Son  Alive  No partnership data on file   Family History  Problem Relation Age of Onset   Heart disease Mother        enlarged heart   Cancer Father        pancreatic   COPD Sister    Pulmonary embolism Sister    Edema Sister        Ankles   Heart  disease Brother    Polycystic kidney disease Son    Allergies  Allergen Reactions   Penicillins     Leg pain Did it involve swelling of the face/tongue/throat, SOB, or low BP? No Did it involve sudden or severe rash/hives, skin peeling, or any reaction on the inside of your mouth or nose? No Did you need to seek medical attention at a hospital or doctor's office? No When did it last happen?      childhood allergy If all above answers are "NO", may proceed with cephalosporin use.        ROS    Objective:     BP 120/60 (BP Location: Left Arm, Patient Position: Sitting, Cuff Size: Normal)   Pulse 80   Temp 97.7 F (36.5 C) (Oral)   Resp 18   Ht 5\' 4"  (1.626 m)   SpO2 94%   BMI 22.31 kg/m  BP Readings from Last 3 Encounters:  11/21/23 120/60  10/25/23 103/69  10/05/23 (!) 142/81   Wt Readings from Last 3 Encounters:  10/05/23 130 lb (59 kg)  05/03/23 130 lb (59 kg)  02/28/23 123 lb (55.8 kg)   SpO2 Readings from Last 3 Encounters:  11/21/23 94%  10/25/23 97%  10/05/23 94%      Physical Exam   No results found for any visits on 11/21/23.  Last CBC Lab Results  Component Value Date   WBC 5.0 10/05/2023   HGB 12.0 10/05/2023   HCT 36.3 10/05/2023   MCV 94.8 10/05/2023   MCH 31.3 10/05/2023   RDW 13.6 10/05/2023   PLT 236 10/05/2023   Last metabolic panel Lab Results  Component Value Date   GLUCOSE 112 (H) 10/05/2023   NA 138 10/05/2023   K 4.1 10/05/2023   CL 108 10/05/2023   CO2 23 10/05/2023   BUN 21 10/05/2023   CREATININE 1.01 (H) 10/05/2023   GFRNONAA 58 (L) 10/05/2023   CALCIUM 8.7 (L) 10/05/2023   PROT 6.1 (L) 10/05/2023   ALBUMIN 3.3 (L) 10/05/2023   BILITOT 0.4 10/05/2023   ALKPHOS 53 10/05/2023   AST 23 10/05/2023   ALT 9 10/05/2023   ANIONGAP 7 10/05/2023   Last lipids Lab Results  Component Value Date   CHOL 183 02/24/2022   HDL 61.00 02/24/2022  LDLCALC 105 (H) 02/24/2022   TRIG 87.0 02/24/2022   CHOLHDL 3 02/24/2022    Last hemoglobin A1c No results found for: "HGBA1C" Last thyroid functions Lab Results  Component Value Date   TSH 1.20 06/17/2021   T4TOTAL 5.7 12/14/2020   Last vitamin D Lab Results  Component Value Date   VD25OH 31.40 09/26/2022   Last vitamin B12 and Folate Lab Results  Component Value Date   VITAMINB12 339 09/26/2022   FOLATE 14.9 06/02/2008      The 10-year ASCVD risk score (Arnett DK, et al., 2019) is: 15.9%    Assessment & Plan:   Problem List Items Addressed This Visit       Unprioritized   Urinary tract infection without hematuria - Primary   Relevant Medications   ciprofloxacin (CIPRO) 500 MG tablet  Assessment and Plan    Urinary Tract Infection (UTI)   Recurrent UTIs are likely due to prolonged periods in soiled briefs and potential contamination from bowel movements, with symptoms including increased urinary frequency. There is a risk of increased UTI and E. coli infection from prolonged soiling. Rocephin is suitable due to her penicillin allergy. Emphasized the need for overnight care to prevent prolonged soiling. Administer Rocephin IM and send a prescription for oral antibiotics. A urine sample should be checked in 2-3 weeks. Discuss overnight care needs with her daughters.  Parkinson's Disease   This condition affects her mobility and ability to provide a sterile urine sample. Her caregiver ensures hydration and medication adherence. Challenges of obtaining sterile samples due to the condition were discussed. Continue current management and monitoring.  General Health Maintenance   Discuss potential tonsillectomy with ENT. Manage allergies with medications and environmental controls.  Follow-up   Check urine sample in 2-3 weeks and follow up with daughters regarding overnight care needs.        No follow-ups on file.    Donato Schultz, DO

## 2023-11-21 NOTE — Patient Instructions (Signed)

## 2023-11-24 ENCOUNTER — Telehealth: Payer: Self-pay | Admitting: Family Medicine

## 2023-11-24 NOTE — Telephone Encounter (Signed)
 Copied from CRM [id number]. Topic: Medicare AWV >> Nov 24, 2023 11:35 AM Payton Doughty wrote: Reason for CRM: Called LVM 11/24/2023 to schedule AWV. Please schedule Virtual or Telehealth visits ONLY.   Verlee Rossetti; Care Guide Ambulatory Clinical Support [facility] Direct Dial: [phone]

## 2023-11-27 ENCOUNTER — Encounter: Payer: Self-pay | Admitting: Podiatry

## 2023-11-27 ENCOUNTER — Ambulatory Visit (INDEPENDENT_AMBULATORY_CARE_PROVIDER_SITE_OTHER): Payer: Medicare Other | Admitting: Podiatry

## 2023-11-27 DIAGNOSIS — M79674 Pain in right toe(s): Secondary | ICD-10-CM | POA: Diagnosis not present

## 2023-11-27 DIAGNOSIS — B351 Tinea unguium: Secondary | ICD-10-CM

## 2023-11-27 DIAGNOSIS — M79675 Pain in left toe(s): Secondary | ICD-10-CM | POA: Diagnosis not present

## 2023-11-27 NOTE — Progress Notes (Signed)
   Chief Complaint  Patient presents with   Nail Problem    Patient is here for [facility]    SUBJECTIVE Patient presents to office today complaining of elongated, thickened nails that cause pain while ambulating in shoes.  Patient is unable to trim their own nails. Patient is here for further evaluation and treatment.  Past Medical History:  Diagnosis Date   Breast cancer, left breast (HCC)    Frequency of urination    GAD (generalized anxiety disorder)    Osteopenia    Parkinson disease (HCC)    neurologist-  dr Waunita Schooner--  right side predominant   Parkinson disease (HCC)    Prolapse of vaginal wall    Urgency of urination    Wears glasses     Allergies  Allergen Reactions   Penicillins     Leg pain Did it involve swelling of the face/tongue/throat, SOB, or low BP? No Did it involve sudden or severe rash/hives, skin peeling, or any reaction on the inside of your mouth or nose? No Did you need to seek medical attention at a hospital or doctor's office? No When did it last happen?      childhood allergy If all above answers are "NO", may proceed with cephalosporin use.       OBJECTIVE General Patient is awake, alert, and oriented x 3 and in no acute distress. Derm Skin is dry and supple bilateral. Negative open lesions or macerations. Remaining integument unremarkable. Nails are tender, long, thickened and dystrophic with subungual debris, consistent with onychomycosis, 1-5 bilateral. No signs of infection noted. Vasc  DP and PT pedal pulses palpable bilaterally. Temperature gradient within normal limits.  Neuro Epicritic and protective threshold sensation grossly intact bilaterally.  Musculoskeletal Exam No symptomatic pedal deformities noted bilateral. Muscular strength within normal limits.  ASSESSMENT 1.  Pain due to onychomycosis of toenails both  PLAN OF CARE 1. Patient evaluated today.  2. Instructed to maintain good pedal hygiene and foot care.  3. Mechanical  debridement of nails 1-5 bilaterally performed using a nail nipper. Filed with dremel without incident.  4. Return to clinic in 3 mos.    Felecia Shelling, DPM [facility]  Dr. Felecia Shelling, DPM    [address] [place], [place] [address]                Office [phone]  Fax [phone]

## 2023-12-11 DIAGNOSIS — G20A1 Parkinson's disease without dyskinesia, without mention of fluctuations: Secondary | ICD-10-CM | POA: Diagnosis not present

## 2023-12-11 DIAGNOSIS — Z7409 Other reduced mobility: Secondary | ICD-10-CM | POA: Diagnosis not present

## 2023-12-28 DIAGNOSIS — H2513 Age-related nuclear cataract, bilateral: Secondary | ICD-10-CM | POA: Diagnosis not present

## 2024-01-01 ENCOUNTER — Encounter: Payer: Self-pay | Admitting: Family Medicine

## 2024-01-02 ENCOUNTER — Other Ambulatory Visit: Payer: Self-pay | Admitting: Family Medicine

## 2024-01-02 ENCOUNTER — Encounter: Payer: Self-pay | Admitting: Family Medicine

## 2024-01-02 ENCOUNTER — Ambulatory Visit (INDEPENDENT_AMBULATORY_CARE_PROVIDER_SITE_OTHER): Admitting: Family Medicine

## 2024-01-02 VITALS — BP 114/70 | HR 80 | Temp 97.9°F | Resp 18 | Ht 64.0 in

## 2024-01-02 DIAGNOSIS — R8290 Unspecified abnormal findings in urine: Secondary | ICD-10-CM

## 2024-01-02 DIAGNOSIS — B356 Tinea cruris: Secondary | ICD-10-CM

## 2024-01-02 MED ORDER — NYSTATIN 100000 UNIT/GM EX CREA: 1.0000 | TOPICAL_CREAM | Freq: Two times a day (BID) | CUTANEOUS | 0 refills | Status: DC

## 2024-01-02 MED ORDER — NYSTATIN 100000 UNIT/GM EX POWD: 1.0000 | Freq: Three times a day (TID) | CUTANEOUS | 0 refills | Status: DC

## 2024-01-02 MED ORDER — NYSTATIN 100000 UNIT/GM EX POWD: 1.0000 | Freq: Three times a day (TID) | CUTANEOUS | 3 refills | Status: DC

## 2024-01-02 MED ORDER — NONFORMULARY OR COMPOUNDED ITEM: 0 refills | Status: AC

## 2024-01-02 NOTE — Progress Notes (Signed)
 Established Patient Office Visit  Subjective   Patient ID: Amanda [name], female    DOB: 08-21-1946  Age: 78 y.o. MRN: [id number]  Chief Complaint  Patient presents with   Rash    Inner thighs, left side is worse, pt states itchy and raised.     HPI Discussed the use of AI scribe software for clinical note transcription with the patient, who gave verbal consent to proceed.  History of Present Illness Amanda [name] is a 78 year old female who presents with a groin rash and itching.  She has a rash located in the groin area, which was discovered a few days ago. The rash is red and inflamed, causing significant itching. The caregiver suggests that the rash may be related to moisture and movement, as she wears briefs and pads, complicating efforts to keep the area dry. The caregiver has been applying niacinamide and nystatin cream to the affected area, and the rash appears to be improving with these treatments.  She has been prescribed Myrbetriq for bladder issues but has not been taking it recently due to concerns about potential side effects, such as urinary tract infections (UTIs). There have been no significant changes in urinary output since stopping the medication. There is difficulty in obtaining a urine sample for testing due to her anxiety and movement issues. The caregiver is ensuring she stays hydrated and is changed frequently to prevent UTIs and yeast infections, which have been recurring issues.   Patient Active Problem List   Diagnosis Date Noted   Abnormal urine odor 10/02/2023   Tinea 10/02/2023   Urinary tract infection without hematuria 10/02/2023   Acute right flank pain 05/03/2023   Skin rash 01/16/2023   Olecranon bursitis of right elbow 01/16/2023   Dysuria 09/26/2022   Generalized anxiety disorder 06/02/2022   Osteoporosis 06/02/2022   Screening, ischemic heart disease 02/21/2022   Urinary incontinence 02/21/2022   Frequent falls 02/21/2022   Acute right  ankle pain 06/17/2021   Drug-induced polyneuropathy (HCC) 12/14/2020   Diarrhea 12/14/2020   Incontinence in female 12/14/2020   Dyspepsia 12/14/2020   Bunion 08/19/2019   Hammer toe 08/19/2019   Breast cancer, stage 2, left (HCC) 11/08/2018   Flexural eczema 05/13/2018   Malignant neoplasm of lower-outer quadrant of left breast of female, estrogen receptor positive (HCC) 08/14/2017   Edema, lower extremity 05/02/2017   Insect sting 05/09/2016   Prolapse of female pelvic organs 01/11/2016   Biceps tendinitis on left 02/18/2014   Parkinson disease (HCC) 10/03/2012   Sinusitis 11/09/2011   Anxiety 06/03/2009   OVARIAN CYST 06/02/2008   Osteopenia 06/02/2008   FATIGUE 06/02/2008   Past Medical History:  Diagnosis Date   Breast cancer, left breast (HCC)    Frequency of urination    GAD (generalized anxiety disorder)    Osteopenia    Parkinson disease (HCC)    neurologist-  dr Waunita Schooner--  right side predominant   Parkinson disease (HCC)    Prolapse of vaginal wall    Urgency of urination    Wears glasses    Past Surgical History:  Procedure Laterality Date   ABDOMINAL HYSTERECTOMY     BREAST BIOPSY Left 07/2017   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   COLONOSCOPY  last one 2007   CYSTOCELE REPAIR N/A 01/11/2016   Procedure: ANTERIOR REPAIR (CYSTOCELE) COLOPLAST FASCIA  SACROSPINOUS FIXATION;  Surgeon: Jethro Bolus, MD;  Location: [facility];  Service: Urology;  Laterality: N/A;  CYSTOSCOPY W/ URETERAL STENT PLACEMENT Bilateral 01/11/2016   Procedure: CYSTOSCOPY WITH BILATERAL RETROGRADE PYELOGRAM/URETERAL STENT PLACEMENT;  Surgeon: Annamarie Kid, MD;  Location: [facility] [place];  Service: Urology;  Laterality: Bilateral;   ESOPHAGOGASTRODUODENOSCOPY  2007   EXTERNAL EAR SURGERY Bilateral age 25   LAPAROSCOPIC OVARIAN CYSTECTOMY  March 2008   unilateral   MASTECTOMY Left 2018   PUBOVAGINAL SLING N/A 01/11/2016   Procedure:   ALTIS SLING SINGLE INCISION;  Surgeon: Annamarie Kid, MD;  Location: [facility];  Service: Urology;  Laterality: N/A;   RECTOCELE REPAIR N/A 01/11/2016   Procedure: POSTERIOR REPAIR (RECTOCELE), PERINEAPLASTY;  Surgeon: Thurman Flores, MD;  Location: [facility];  Service: Gynecology;  Laterality: N/A;   SIMPLE MASTECTOMY WITH AXILLARY SENTINEL NODE BIOPSY Left 11/08/2018   Procedure: LEFT SIMPLE MASTECTOMY WITH LEFT  AXILLARY SENTINEL NODE MAPPING;  Surgeon: Sim Dryer, MD;  Location: [facility];  Service: General;  Laterality: Left;   VAGINAL HYSTERECTOMY N/A 01/11/2016   Procedure: TOTAL VAGINAL HYSTERECTOMY ;  Surgeon: Thurman Flores, MD;  Location: [facility];  Service: Gynecology;  Laterality: N/A;   Social History   Tobacco Use   Smoking status: Never   Smokeless tobacco: Never  Vaping Use   Vaping status: Never Used  Substance Use Topics   Alcohol use: No   Drug use: No   Social History   Socioeconomic History   Marital status: Married    Spouse name: Aimee Houseman   Number of children: 3   Years of education: College   Highest education level: Not on file  Occupational History   Occupation: retired  Tobacco Use   Smoking status: Never   Smokeless tobacco: Never  Vaping Use   Vaping status: Never Used  Substance and Sexual Activity   Alcohol use: No   Drug use: No   Sexual activity: Not on file  Other Topics Concern   Not on file  Social History Narrative   Pt lives at home with family.Caffeine Use: some, occasionally   Has Parkinson's   Social Drivers of Health   Financial Resource Strain: Low Risk  (06/05/2021)   Overall Financial Resource Strain (CARDIA)    Difficulty of Paying Living Expenses: Not hard at all  Food Insecurity: No Food Insecurity (01/05/2023)   Hunger Vital Sign    Worried About Running Out of Food in the Last Year: Never true    Ran Out of Food in the Last Year: Never true  Transportation  Needs: No Transportation Needs (01/05/2023)   PRAPARE - Administrator, Civil Service (Medical): No    Lack of Transportation (Non-Medical): No  Physical Activity: Inactive (06/05/2021)   Exercise Vital Sign    Days of Exercise per Week: 0 days    Minutes of Exercise per Session: 0 min  Stress: Stress Concern Present (06/05/2021)   [employer] of Occupational Health - Occupational Stress Questionnaire    Feeling of Stress : To some extent  Social Connections: Moderately Integrated (06/05/2021)   Social Connection and Isolation Panel [NHANES]    Frequency of Communication with Friends and Family: More than three times a week    Frequency of Social Gatherings with Friends and Family: Once a week    Attends Religious Services: 1 to 4 times per year    Active Member of [employer] or Organizations: No    Attends Banker Meetings: Never    Marital Status: Married  Catering manager Violence: Not At  Risk (01/05/2023)   Humiliation, Afraid, Rape, and Kick questionnaire    Fear of Current or Ex-Partner: No    Emotionally Abused: No    Physically Abused: No    Sexually Abused: No   Family Status  Relation Name Status   Mother  Deceased   Father  Deceased   Sister  Alive   Brother  Deceased at age 67   Other  (Not Specified)       weight disorder   Son  Alive  No partnership data on file   Family History  Problem Relation Age of Onset   Heart disease Mother        enlarged heart   Cancer Father        pancreatic   COPD Sister    Pulmonary embolism Sister    Edema Sister        Ankles   Heart disease Brother    Polycystic kidney disease Son    Allergies  Allergen Reactions   Penicillins     Leg pain Did it involve swelling of the face/tongue/throat, SOB, or low BP? No Did it involve sudden or severe rash/hives, skin peeling, or any reaction on the inside of your mouth or nose? No Did you need to seek medical attention at a hospital or doctor's office?  No When did it last happen?      childhood allergy If all above answers are "NO", may proceed with cephalosporin use.        Review of Systems  Constitutional:  Negative for fever and malaise/fatigue.  HENT:  Negative for congestion.   Eyes:  Negative for blurred vision.  Respiratory:  Negative for cough and shortness of breath.   Cardiovascular:  Negative for chest pain, palpitations and leg swelling.  Gastrointestinal:  Negative for vomiting.  Genitourinary:  Positive for dysuria and frequency.  Musculoskeletal:  Negative for back pain.  Skin:  Positive for itching and rash.  Neurological:  Negative for loss of consciousness and headaches.      Objective:     BP 114/70 (BP Location: Left Arm, Patient Position: Sitting, Cuff Size: Normal)   Pulse 80   Temp 97.9 F (36.6 C) (Oral)   Resp 18   Ht 5\' 4"  (1.626 m)   SpO2 94%   BMI 22.31 kg/m  BP Readings from Last 3 Encounters:  01/02/24 114/70  11/21/23 120/60  10/25/23 103/69   Wt Readings from Last 3 Encounters:  10/05/23 130 lb (59 kg)  05/03/23 130 lb (59 kg)  02/28/23 123 lb (55.8 kg)   SpO2 Readings from Last 3 Encounters:  01/02/24 94%  11/21/23 94%  10/25/23 97%      Physical Exam Vitals and nursing note reviewed.  Constitutional:      General: She is not in acute distress.    Appearance: Normal appearance. She is well-developed.  HENT:     Head: Normocephalic and atraumatic.  Eyes:     General: No scleral icterus.       Right eye: No discharge.        Left eye: No discharge.  Cardiovascular:     Rate and Rhythm: Normal rate and regular rhythm.     Heart sounds: No murmur heard. Pulmonary:     Effort: Pulmonary effort is normal. No respiratory distress.     Breath sounds: Normal breath sounds.  Musculoskeletal:        General: Normal range of motion.     Cervical back: Normal range  of motion and neck supple.     Right lower leg: No edema.     Left lower leg: No edema.  Skin:    General:  Skin is warm and dry.     Findings: Erythema and rash present. Rash is macular.       Neurological:     Mental Status: She is alert and oriented to person, place, and time.  Psychiatric:        Mood and Affect: Mood normal.        Behavior: Behavior normal.        Thought Content: Thought content normal.        Judgment: Judgment normal.      No results found for any visits on 01/02/24.  Last CBC Lab Results  Component Value Date   WBC 5.0 10/05/2023   HGB 12.0 10/05/2023   HCT 36.3 10/05/2023   MCV 94.8 10/05/2023   MCH 31.3 10/05/2023   RDW 13.6 10/05/2023   PLT 236 10/05/2023   Last metabolic panel Lab Results  Component Value Date   GLUCOSE 112 (H) 10/05/2023   NA 138 10/05/2023   K 4.1 10/05/2023   CL 108 10/05/2023   CO2 23 10/05/2023   BUN 21 10/05/2023   CREATININE 1.01 (H) 10/05/2023   GFRNONAA 58 (L) 10/05/2023   CALCIUM 8.7 (L) 10/05/2023   PROT 6.1 (L) 10/05/2023   ALBUMIN 3.3 (L) 10/05/2023   BILITOT 0.4 10/05/2023   ALKPHOS 53 10/05/2023   AST 23 10/05/2023   ALT 9 10/05/2023   ANIONGAP 7 10/05/2023   Last lipids Lab Results  Component Value Date   CHOL 183 02/24/2022   HDL 61.00 02/24/2022   LDLCALC 105 (H) 02/24/2022   TRIG 87.0 02/24/2022   CHOLHDL 3 02/24/2022   Last hemoglobin A1c No results found for: "HGBA1C" Last thyroid functions Lab Results  Component Value Date   TSH 1.20 06/17/2021   T4TOTAL 5.7 12/14/2020   Last vitamin D Lab Results  Component Value Date   VD25OH 31.40 09/26/2022   Last vitamin B12 and Folate Lab Results  Component Value Date   VITAMINB12 339 09/26/2022   FOLATE 14.9 06/02/2008      The 10-year ASCVD risk score (Arnett DK, et al., 2019) is: 14.5%    Assessment & Plan:   Problem List Items Addressed This Visit       Unprioritized   Abnormal urine odor   Relevant Orders   POCT Urinalysis Dipstick (Automated)   Other Visit Diagnoses       Tinea cruris    -  Primary   Relevant  Medications   nystatin cream (MYCOSTATIN)   nystatin (MYCOSTATIN/NYSTOP) powder     Assessment and Plan Assessment & Plan Intertrigo with possible yeast infection   The groin rash is red and inflamed, likely from moisture and friction due to briefs, and is improving with nystatin. A yeast infection is suspected due to the warm, moist environment. She should apply nystatin cream or powder twice daily, keep the area clean and dry, and consider using A&D or diaper ointment to protect the skin. Cleaning with a squeeze bottle and allowing air exposure is recommended. Sensitivity to pads or diapers and the impact of warmer weather on moisture were considered. A urine sample will be provided for testing.  Urinary incontinence   She experiences urinary incontinence, and Myrbetriq was ineffective and discontinued. Managing incontinence is challenging and may contribute to the groin rash due to moisture. Adequate hydration  is encouraged.  Urinary tract infection (UTI)   There is concern for a possible UTI due to urinary symptoms and difficulty obtaining a urine sample. A cup will be provided for home collection, and the sample will be tested for UTI upon submission. If confirmed, rechecking the urine two weeks after treatment is considered.  General Health Maintenance   She is eligible for the shingles vaccine, covered by Medicare Part D, to be administered at a pharmacy. The vaccine follows a two-dose schedule, given two to six months apart.  Follow-up   A neurology appointment with Dr. Veverly Grace is scheduled for the third day, and a new wheelchair will be received on Monday to help with posture. This is important for overall health management.    No follow-ups on file.    Denaya Horn R Lowne Chase, DO

## 2024-01-03 ENCOUNTER — Telehealth: Payer: Self-pay

## 2024-01-03 ENCOUNTER — Other Ambulatory Visit (INDEPENDENT_AMBULATORY_CARE_PROVIDER_SITE_OTHER)

## 2024-01-03 ENCOUNTER — Other Ambulatory Visit (HOSPITAL_COMMUNITY): Payer: Self-pay

## 2024-01-03 DIAGNOSIS — R8290 Unspecified abnormal findings in urine: Secondary | ICD-10-CM

## 2024-01-03 DIAGNOSIS — R82998 Other abnormal findings in urine: Secondary | ICD-10-CM | POA: Diagnosis not present

## 2024-01-03 LAB — POC URINALSYSI DIPSTICK (AUTOMATED)
Bilirubin, UA: NEGATIVE
Blood, UA: NEGATIVE
Glucose, UA: NEGATIVE
Ketones, UA: NEGATIVE
Nitrite, UA: NEGATIVE
Protein, UA: NEGATIVE
Spec Grav, UA: 1.01 (ref 1.010–1.025)
Urobilinogen, UA: 0.2 U/dL
pH, UA: 6 (ref 5.0–8.0)

## 2024-01-03 NOTE — Telephone Encounter (Signed)
 Nystatin powder was filled for pt through insurance 01/02/2024. Looks like medication needed to be processed at a higher day supply.

## 2024-01-03 NOTE — Addendum Note (Signed)
 Addended by: Susa Engman A on: 01/03/2024 12:48 PM   Modules accepted: Orders

## 2024-01-03 NOTE — Telephone Encounter (Signed)
Please sign off on rx in this encounter as PA team is unable to resolve RX requests. Thank you

## 2024-01-03 NOTE — Telephone Encounter (Signed)
 Pharmacy Patient Advocate Encounter   Received notification from RX Request Messages that prior authorization for Nystatin Powder is required/requested.   Insurance verification completed.   The patient is insured through [employer] .   Per test claim: Refill too soon. PA is not needed at this time. Medication was filled 01/02/2024. Next eligible fill date is 01/14/2024.

## 2024-01-04 LAB — URINE CULTURE
MICRO NUMBER:: 16337377
SPECIMEN QUALITY:: ADEQUATE

## 2024-01-05 ENCOUNTER — Encounter: Payer: Self-pay | Admitting: Family Medicine

## 2024-01-17 DIAGNOSIS — G20A1 Parkinson's disease without dyskinesia, without mention of fluctuations: Secondary | ICD-10-CM | POA: Diagnosis not present

## 2024-01-17 DIAGNOSIS — G3184 Mild cognitive impairment, so stated: Secondary | ICD-10-CM | POA: Diagnosis not present

## 2024-02-13 ENCOUNTER — Ambulatory Visit

## 2024-02-29 ENCOUNTER — Ambulatory Visit: Admitting: Podiatry

## 2024-02-29 ENCOUNTER — Encounter: Payer: Self-pay | Admitting: Podiatry

## 2024-02-29 DIAGNOSIS — M79674 Pain in right toe(s): Secondary | ICD-10-CM | POA: Diagnosis not present

## 2024-02-29 DIAGNOSIS — M79675 Pain in left toe(s): Secondary | ICD-10-CM

## 2024-02-29 DIAGNOSIS — G629 Polyneuropathy, unspecified: Secondary | ICD-10-CM | POA: Diagnosis not present

## 2024-02-29 DIAGNOSIS — B351 Tinea unguium: Secondary | ICD-10-CM | POA: Diagnosis not present

## 2024-02-29 NOTE — Progress Notes (Signed)
 This patient returns to my office for at risk foot care.  This patient requires this care by a professional since this patient will be at risk due to having neuropathy.  This patient is unable to cut nails herself since the patient cannot reach her nails.These nails are painful walking and wearing shoes.  This patient presents for at risk foot care today.  General Appearance  Alert, conversant and in no acute stress.  Vascular  Dorsalis pedis and posterior tibial  pulses are palpable  bilaterally.  Capillary return is within normal limits  bilaterally. Temperature is within normal limits  bilaterally.  Neurologic  Senn-Weinstein monofilament wire test within normal limits  bilaterally. Muscle power within normal limits bilaterally.  Nails Thick disfigured discolored nails with subungual debris  from hallux to fifth toes bilaterally. No evidence of bacterial infection or drainage bilaterally.  Orthopedic  No limitations of motion  feet .  No crepitus or effusions noted.  HAV  B/L.  Hammer toes  B/L.  Skin  normotropic skin with no porokeratosis noted bilaterally.  No signs of infections or ulcers noted.     Onychomycosis  Pain in right toes  Pain in left toes  Consent was obtained for treatment procedures.   Mechanical debridement of nails 1-5  bilaterally performed with a nail nipper.  Filed with dremel without incident.    Return office visit   3 months                   Told patient to return for periodic foot care and evaluation due to potential at risk complications.   Ruffin Cotton DPM

## 2024-04-16 ENCOUNTER — Encounter (HOSPITAL_BASED_OUTPATIENT_CLINIC_OR_DEPARTMENT_OTHER): Payer: Self-pay

## 2024-04-16 ENCOUNTER — Ambulatory Visit: Payer: Self-pay | Admitting: *Deleted

## 2024-04-16 ENCOUNTER — Emergency Department (HOSPITAL_BASED_OUTPATIENT_CLINIC_OR_DEPARTMENT_OTHER)

## 2024-04-16 ENCOUNTER — Other Ambulatory Visit: Payer: Self-pay

## 2024-04-16 ENCOUNTER — Emergency Department (HOSPITAL_BASED_OUTPATIENT_CLINIC_OR_DEPARTMENT_OTHER)
Admission: EM | Admit: 2024-04-16 | Discharge: 2024-04-17 | Disposition: A | Discharge status: Home / Self Care | Attending: Emergency Medicine | Admitting: Emergency Medicine

## 2024-04-16 DIAGNOSIS — Z7982 Long term (current) use of aspirin: Secondary | ICD-10-CM | POA: Diagnosis not present

## 2024-04-16 DIAGNOSIS — S299XXA Unspecified injury of thorax, initial encounter: Secondary | ICD-10-CM | POA: Diagnosis present

## 2024-04-16 DIAGNOSIS — S20211A Contusion of right front wall of thorax, initial encounter: Secondary | ICD-10-CM | POA: Diagnosis not present

## 2024-04-16 DIAGNOSIS — N83202 Unspecified ovarian cyst, left side: Secondary | ICD-10-CM | POA: Insufficient documentation

## 2024-04-16 DIAGNOSIS — G20C Parkinsonism, unspecified: Secondary | ICD-10-CM | POA: Insufficient documentation

## 2024-04-16 DIAGNOSIS — W050XXA Fall from non-moving wheelchair, initial encounter: Secondary | ICD-10-CM | POA: Insufficient documentation

## 2024-04-16 DIAGNOSIS — N949 Unspecified condition associated with female genital organs and menstrual cycle: Secondary | ICD-10-CM

## 2024-04-16 DIAGNOSIS — R0781 Pleurodynia: Secondary | ICD-10-CM | POA: Diagnosis not present

## 2024-04-16 DIAGNOSIS — S3991XA Unspecified injury of abdomen, initial encounter: Secondary | ICD-10-CM | POA: Diagnosis not present

## 2024-04-16 DIAGNOSIS — W19XXXA Unspecified fall, initial encounter: Secondary | ICD-10-CM

## 2024-04-16 DIAGNOSIS — S3993XA Unspecified injury of pelvis, initial encounter: Secondary | ICD-10-CM | POA: Diagnosis not present

## 2024-04-16 LAB — BASIC METABOLIC PANEL WITH GFR
Anion gap: 12 (ref 5–15)
BUN: 16 mg/dL (ref 8–23)
CO2: 24 mmol/L (ref 22–32)
Calcium: 9.3 mg/dL (ref 8.9–10.3)
Chloride: 105 mmol/L (ref 98–111)
Creatinine, Ser: 0.93 mg/dL (ref 0.44–1.00)
GFR, Estimated: >60 mL/min (ref >60)
Glucose, Bld: 91 mg/dL (ref 70–99)
Potassium: 3.9 mmol/L (ref 3.5–5.1)
Sodium: 141 mmol/L (ref 135–145)

## 2024-04-16 LAB — CBC WITH DIFFERENTIAL/PLATELET
Abs Immature Granulocytes: 0.02 K/uL (ref 0.00–0.07)
Basophils Absolute: 0 K/uL (ref 0.0–0.1)
Basophils Relative: 1 %
Eosinophils Absolute: 0.3 K/uL (ref 0.0–0.5)
Eosinophils Relative: 6 %
HCT: 39.6 % (ref 36.0–46.0)
Hemoglobin: 13 g/dL (ref 12.0–15.0)
Immature Granulocytes: 0 %
Lymphocytes Relative: 24 %
Lymphs Abs: 1.4 K/uL (ref 0.7–4.0)
MCH: 30.7 pg (ref 26.0–34.0)
MCHC: 32.8 g/dL (ref 30.0–36.0)
MCV: 93.4 fL (ref 80.0–100.0)
Monocytes Absolute: 0.4 K/uL (ref 0.1–1.0)
Monocytes Relative: 8 %
Neutro Abs: 3.4 K/uL (ref 1.7–7.7)
Neutrophils Relative %: 61 %
Platelets: 227 K/uL (ref 150–400)
RBC: 4.24 MIL/uL (ref 3.87–5.11)
RDW: 13.5 % (ref 11.5–15.5)
WBC: 5.6 K/uL (ref 4.0–10.5)
nRBC: 0 % (ref 0.0–0.2)

## 2024-04-16 MED ORDER — IOHEXOL 300 MG/ML  SOLN
100.0000 mL | Freq: Once | INTRAMUSCULAR | Status: AC | PRN
  Administered 2024-04-16: 100 mL via INTRAVENOUS

## 2024-04-16 MED ORDER — ACETAMINOPHEN 325 MG PO TABS
650.0000 mg | ORAL_TABLET | Freq: Once | ORAL | Status: AC
  Administered 2024-04-16: 650 mg via ORAL
  Filled 2024-04-16: qty 2

## 2024-04-16 NOTE — ED Notes (Signed)
 Patient transported to CT

## 2024-04-16 NOTE — Discharge Instructions (Signed)
 As we discussed, your chest and abdomen do not appear to have any significant injury or rib fracture. Continue Tylenol  for pain as needed.   Also discussed was the finding of an abnormal complex cyst in the left pelvis that will need further evaluation by ultrasound. Please talk to your primary care doctor about arranging this study in the near future.

## 2024-04-16 NOTE — Telephone Encounter (Signed)
 Spoke to patient's care giver and they will try to take patient to urgent care tonight.

## 2024-04-16 NOTE — Telephone Encounter (Signed)
 FYI Only or Action Required?: Action required by provider: update on patient condition.  Patient was last seen in primary care on 01/02/2024 by Amanda [name], Amanda SAUNDERS, DO.  Called Nurse Triage reporting Chest Injury.  Symptoms began several days ago.  Interventions attempted: ice/rest.  Symptoms are: unchanged.  Triage Disposition: See HCP Within 4 Hours (Or PCP Triage)  Patient/caregiver understands and will follow disposition?: YesPatient's daughter Amanda [name]( DRP) is calling to report they just found out about a fall-patient has said she fell forward- aid is stating patient slid out of chair- patient has brusing on rib with soreness. I have advised UC for evaluation. Daughter will take her today- but request order for urine culture- due to increased wet bedding and recent fall. (She is not sure her mother will be able to urinate on command- they have sterile cups for collection. Please call her at [phone] Reason for Disposition  Patient is confused or is an unreliable provider of information (e.g., dementia, severe intellectual disability, alcohol intoxication)  Answer Assessment - Initial Assessment Questions 1. MECHANISM: How did the injury happen?     Patient fell forward- per patient, home aid- states patient slid out of chair 2. ONSET: When did the injury happen? (.e.g., minutes, hours, days ago)     Saturday 3. LOCATION: Where on the chest is the injury located?     Soreness in rib 4. APPEARANCE: What does the injury look like?     unsure 5. BLEEDING: Is there any bleeding now? If Yes, ask: How long has it been bleeding?     no 6. SEVERITY: Any difficulty with breathing?     Soreness with deep breath 7. SIZE: For cuts, bruises, or swelling, ask: How large is it? (e.g., inches or centimeters)     Small bruise- did not indicate 8. PAIN: Is there pain? If Yes, ask: How bad is the pain? (e.g., Scale 0-10; none, mild, moderate, severe)     Soreness with deep  breath  Daughter is also requesting urine culture- incontinent(increased wet bedding) and fall  Protocols used: Chest Injury-A-AH    Copied from CRM #[id number]. Topic: Clinical - Red Word Triage >> Apr 16, 2024  4:22 PM Amanda [name] wrote: Red Word that prompted transfer to Nurse Triage: patients daughter is on line, Amanda [name].. stated on Saturday patient fell out of her chair and is now having rib pain that's worsening but easing up some. [facility] they have been icing her ribs and this happen on front part of her ribs she said its sore when she breathes in and patient has a UTI as well and wants tested for that too

## 2024-04-16 NOTE — ED Triage Notes (Signed)
 Pt states that she was in her wheelchair and bent over to pick up something and she slipped out on Saturday. Denies LOC. Was on the ground for 5-10 minutes. Her right ribs are now hurting. The pain occurs when she takes deep breaths. Pt's daughter is concerned for UTI. Pt denies burning, frequency, and urgency. Pt is accompanied by her caregiver, Luke.

## 2024-04-16 NOTE — ED Provider Notes (Signed)
 [facility] Provider Note   CSN: [id number] Arrival date & time: 04/16/24  1844     Patient presents with: Amanda [name]   Amanda [name] is a 78 y.o. female.   Patient to ED for evaluation of pain after a fall 3 days ago. She is wheelchair bound secondary to Parkinson's and states that 2 days ago she was leaning forward to pick something up from the floor and toppled out of her chair. She denies hitting her head. She reports pain in the right lateral chest and RUQ abdomen. No vomiting. The pain is worse with deep breathing but she denies feeling sOB. Caregiver at bedside who reports she can stand with assistance and pivot to toilet and she continues to be able to stand without pelvic, hip or LE pain.   The history is provided by the patient and a caregiver. No language interpreter was used.  Fall       Prior to Admission medications   Medication Sig Start Date End Date Taking? Authorizing Provider  acetaminophen  (TYLENOL ) 500 MG tablet Take 500 mg by mouth every 6 (six) hours as needed for fever or mild pain (pain score 1-3).    [provider]  aspirin EC 325 MG tablet Take 325 mg by mouth daily as needed for moderate pain.    [provider]  b complex vitamins tablet Take 1 tablet by mouth daily. MEGA FOOD BALANCE SOURCE    [provider]  CALCIUM-MAGNESIUM-VITAMIN D  PO Take 2 tablets by mouth 2 (two) times daily.    [provider]  carbidopa -levodopa  (SINEMET  IR) 25-100 MG tablet Take 1 tablet by mouth 3 (three) times daily. 1 whole in morning and 1/2 tablet every 3 hours    [provider]  cephALEXin  (KEFLEX ) 500 MG capsule Take 1 capsule (500 mg total) by mouth 2 (two) times daily. 10/02/23   Antonio Cyndee Jamee JONELLE, DO  ciprofloxacin  (CIPRO ) 500 MG tablet Take 1 tablet (500 mg total) by mouth 2 (two) times daily. 11/21/23   Antonio Cyndee Jamee R, DO  famotidine  (PEPCID ) 20 MG tablet TAKE 1 TABLET  BY MOUTH TWICE A DAY 03/23/21   Antonio Cyndee, Jamee JONELLE, DO  hydrocortisone  (ANUSOL -HC) 25 MG suppository Place 1 suppository (25 mg total) rectally 2 (two) times daily. 10/02/23   Lowne Chase, Yvonne R, DO  ibuprofen (ADVIL) 200 MG tablet Take 200-400 mg by mouth every 6 (six) hours as needed for headache or mild pain (pain score 1-3).    [provider]  melatonin 3 MG TABS tablet Take 3 mg by mouth at bedtime as needed.    [provider]  mirabegron  ER (MYRBETRIQ ) 25 MG TB24 tablet Take 1 tablet (25 mg total) by mouth daily. 09/25/23   Antonio Cyndee Jamee JONELLE, DO  NONFORMULARY OR COMPOUNDED ITEM Please keep area in groin clean and dry-- use the nystatin  powder/ cream bid 01/02/24   Antonio Cyndee, Yvonne R, DO  nystatin  (MYCOSTATIN /NYSTOP ) powder APPLY TOPICALLY 3 TIMES A DAY 01/03/24   Lowne Chase, Yvonne R, DO  nystatin  cream (MYCOSTATIN ) Apply 1 Application topically 2 (two) times daily.    [provider]  nystatin  cream (MYCOSTATIN ) Apply 1 Application topically 2 (two) times daily. 01/02/24   Lowne Chase, Yvonne R, DO  nystatin  powder Apply 1 Application topically 3 (three) times daily.    [provider]  oseltamivir  (TAMIFLU ) 75 MG capsule Take 1 capsule (75 mg total) by mouth daily.  10/20/23   Lowne Chase, Yvonne R, DO  pramipexole  (MIRAPEX ) 0.75 MG tablet Take 0.375 mg by mouth 3 (three) times daily. 05/12/22   [provider]  Probiotic Product (PROBIOTIC DAILY) CAPS Take 1 capsule by mouth daily as needed (indigestion).    [provider]  Propylene Glycol-Glycerin (SOOTHE OP) Place 2 drops into both eyes daily as needed (dry eyes).    [provider]  rivastigmine (EXELON) 1.5 MG capsule Take 1.5 mg by mouth 2 (two) times daily. 07/18/22   [provider]  sertraline  (ZOLOFT ) 50 MG tablet Take 1 tablet (50 mg total) by mouth at bedtime. Patient taking differently: Take 100 mg by mouth at bedtime. 05/12/22   Antonio Cyndee Jamee JONELLE,  DO    Allergies: Penicillins    Review of Systems  Updated Vital Signs BP 128/78   Pulse 64   Temp 98.1 F (36.7 C) (Oral)   Resp 17   Ht 5' 4 (1.626 m)   Wt 54.4 kg   SpO2 97%   BMI 20.60 kg/m   Physical Exam Vitals and nursing note reviewed.  Constitutional:      Appearance: She is well-developed.  HENT:     Head: Normocephalic and atraumatic.  Cardiovascular:     Rate and Rhythm: Normal rate and regular rhythm.     Heart sounds: No murmur heard. Pulmonary:     Effort: Pulmonary effort is normal.     Breath sounds: Normal breath sounds. No wheezing, rhonchi or rales.  Abdominal:     Palpations: Abdomen is soft.     Tenderness: There is no abdominal tenderness. There is no guarding or rebound.      Comments: Bruising to lateral right side at RUQ abdomen/lower chest. Tender from more proximal mid-axillary line extending to RUQ abdomen. Abdomen is soft, is not distended. No other abdominal tenderness.   Musculoskeletal:        General: Normal range of motion.     Cervical back: Normal range of motion and neck supple.  Skin:    General: Skin is warm and dry.  Neurological:     General: No focal deficit present.     Mental Status: She is alert and oriented to person, place, and time.     (all labs ordered are listed, but only abnormal results are displayed) Labs Reviewed  CBC WITH DIFFERENTIAL/PLATELET  BASIC METABOLIC PANEL WITH GFR  URINALYSIS, ROUTINE W REFLEX MICROSCOPIC   Results for orders placed or performed during the hospital encounter of 04/16/24  CBC with Differential   Collection Time: 04/16/24  9:47 PM  Result Value Ref Range   WBC 5.6 4.0 - 10.5 K/uL   RBC 4.24 3.87 - 5.11 MIL/uL   Hemoglobin 13.0 12.0 - 15.0 g/dL   HCT 60.3 63.9 - 53.9 %   MCV 93.4 80.0 - 100.0 fL   MCH 30.7 26.0 - 34.0 pg   MCHC 32.8 30.0 - 36.0 g/dL   RDW 86.4 88.4 - 84.4 %   Platelets 227 150 - 400 K/uL   nRBC 0.0 0.0 - 0.2 %   Neutrophils Relative % 61 %   Neutro  Abs 3.4 1.7 - 7.7 K/uL   Lymphocytes Relative 24 %   Lymphs Abs 1.4 0.7 - 4.0 K/uL   Monocytes Relative 8 %   Monocytes Absolute 0.4 0.1 - 1.0 K/uL   Eosinophils Relative 6 %   Eosinophils Absolute 0.3 0.0 - 0.5 K/uL   Basophils Relative 1 %  Basophils Absolute 0.0 0.0 - 0.1 K/uL   Immature Granulocytes 0 %   Abs Immature Granulocytes 0.02 0.00 - 0.07 K/uL  Basic metabolic panel   Collection Time: 04/16/24  9:47 PM  Result Value Ref Range   Sodium 141 135 - 145 mmol/L   Potassium 3.9 3.5 - 5.1 mmol/L   Chloride 105 98 - 111 mmol/L   CO2 24 22 - 32 mmol/L   Glucose, Bld 91 70 - 99 mg/dL   BUN 16 8 - 23 mg/dL   Creatinine, Ser 9.06 0.44 - 1.00 mg/dL   Calcium 9.3 8.9 - 89.6 mg/dL   GFR, Estimated >39 >39 mL/min   Anion gap 12 5 - 15     EKG: None  Radiology: CT CHEST ABDOMEN PELVIS W CONTRAST Result Date: 04/16/2024 CLINICAL DATA:  Poly trauma, blunt. Right rib pain and bruising following a fall from wheelchair 3 days ago. History of left breast cancer. Urinary frequency. Cystocele. EXAM: CT CHEST, ABDOMEN, AND PELVIS WITH CONTRAST TECHNIQUE: Multidetector CT imaging of the chest, abdomen and pelvis was performed following the standard protocol during bolus administration of intravenous contrast. RADIATION DOSE REDUCTION: This exam was performed according to the departmental dose-optimization program which includes automated exposure control, adjustment of the mA and/or kV according to patient size and/or use of iterative reconstruction technique. CONTRAST:  OMNIPAQUE  IOHEXOL  300 MG/ML  SOLN COMPARISON:  Chest radiograph 10/05/2023. CT abdomen and pelvis 11/19/2015 FINDINGS: CT CHEST FINDINGS Cardiovascular: Normal heart size. No pericardial effusions. Normal caliber thoracic aorta. No aortic dissection. Great vessel origins are patent. Pulmonary arteries are patent. No evidence of significant central pulmonary embolus. Mediastinum/Nodes: Subcentimeter thyroid  gland nodules. No  imaging follow-up is indicated. Esophagus is decompressed. Moderate-sized esophageal hiatal hernia. No significant lymphadenopathy. Postoperative left mastectomy. Lungs/Pleura: Bandlike opacity in the lung bases probably representing atelectasis. No pleural effusion or pneumothorax. Musculoskeletal: No acute bony abnormalities. No displaced rib fractures are identified. CT ABDOMEN PELVIS FINDINGS Hepatobiliary: No hepatic injury or perihepatic hematoma. Gallbladder is unremarkable. Pancreas: Unremarkable. No pancreatic ductal dilatation or surrounding inflammatory changes. Spleen: No splenic injury or perisplenic hematoma. Adrenals/Urinary Tract: No adrenal hemorrhage or renal injury identified. Bladder is unremarkable. Stomach/Bowel: Stomach is within normal limits. Appendix appears normal. No evidence of bowel wall thickening, distention, or inflammatory changes. Vascular/Lymphatic: Aortic atherosclerosis. No enlarged abdominal or pelvic lymph nodes. Reproductive: Status post hysterectomy. Large left adnexal cystic structure measuring 8.9 cm. Other: No free air or free fluid in the abdomen. Small periumbilical hernia containing fat. Musculoskeletal: Degenerative changes in the spine. No acute bony abnormalities. IMPRESSION: 1. No acute posttraumatic changes demonstrated in the chest, abdomen, or pelvis. No displaced rib fractures. 2. Band like opacities in the lung bases, greater on the right. This likely represents atelectasis. 3. Aortic atherosclerosis. 4. 8.9 cm left adnexal cystic lesion. Because this lesion is not adequately characterized, prompt US  is recommended for further evaluation. Note: This recommendation does not apply to premenarchal patients and to those with increased risk (genetic, family history, elevated tumor markers or other high-risk factors) of ovarian cancer. Reference: JACR 2020 Feb; 17(2):248-254 Electronically Signed   By: Elsie Gravely M.D.   On: 04/16/2024 23:02     Procedures    Medications Ordered in the ED  acetaminophen  (TYLENOL ) tablet 650 mg (650 mg Oral Given 04/16/24 2158)  iohexol  (OMNIPAQUE ) 300 MG/ML solution 100 mL (100 mLs Intravenous Contrast Given 04/16/24 2242)  Medical Decision Making This patient presents to the ED for concern of fall with chest/abdominal pain, this involves an extensive number of treatment options, and is a complaint that carries with it a high risk of complications and morbidity.  The differential diagnosis includes rib fracture, PTX, tension PTX, liver injury, perforation, intra-abdominal bleeding   Co morbidities that complicate the patient evaluation  Parkinson's Disease, GAD, breast cancer   Additional history obtained:  Additional history and/or information obtained from chart review, notable for no frequent emergency department visits   Lab Tests:  I Ordered, and personally interpreted labs.  The pertinent results include:   CBC - no leukocytosis, normal hgb, normal plts Bmet - no abnormal values UA - not collected   Imaging Studies ordered:  I ordered imaging studies including CT chest/abd/pel Per radiologist interpretation: IMPRESSION: 1. No acute posttraumatic changes demonstrated in the chest, abdomen, or pelvis. No displaced rib fractures. 2. Band like opacities in the lung bases, greater on the right. This likely represents atelectasis. 3. Aortic atherosclerosis. 4. 8.9 cm left adnexal cystic lesion. Because this lesion is not adequately characterized, prompt US  is recommended for further evaluation. Note: This recommendation does not apply to premenarchal patients and to those with increased risk (genetic, family history, elevated tumor markers or other high-risk factors) of ovarian cancer. Reference: JACR 2020 Feb; 17(2):248-254    Cardiac Monitoring:  The patient was maintained on a cardiac monitor.  I personally viewed and interpreted the cardiac  monitored which showed an underlying rhythm of: n/a   Medicines ordered and prescription drug management:  I ordered medication including Tylenol   for pain Reevaluation of the patient after these medicines showed that the patient improved I have reviewed the patients home medicines and have made adjustments as needed   Test Considered:  N/a   Critical Interventions:  N/a   Consultations Obtained:  I requested consultation with the n/a,  and discussed lab and imaging findings as well as pertinent plan - they recommend: n/a   Problem List / ED Course:  Here after fall from wheelchair several days ago. C/O right lateral chest and abdominal pain with exam tenderness.  Labs unremarkable Caregiver reports she may have a UTI but states the patient won't be able to provide a specimen in the ED. No fever, no WBC count. She denies symptoms. Caregiver with be in touch with PCP to test for UTI.  No injury identified from the fall 3 days ago.  There is a left adnexal complex cyst that was discussed with the patient and caregiver. Follow up will be arranged by caregiver in the outpatient setting.    Reevaluation:  After the interventions noted above, I reevaluated the patient and found that they have :improved   Social Determinants of Health:  Limited mobility, wheelchair bound, lives at home   Disposition:  After consideration of the diagnostic results and the patients response to treatment, I feel that the patient would benefit from discharge. .   Amount and/or Complexity of Data Reviewed Labs: ordered. Radiology: ordered.  Risk OTC drugs. Prescription drug management.        Final diagnoses:  Fall, initial encounter  Contusion of right chest wall, initial encounter  Adnexal cyst    ED Discharge Orders     None          Amanda [name] 04/16/24 2329    Amanda Lamar BROCKS, MD 04/17/24 2356

## 2024-04-16 NOTE — ED Notes (Signed)
 Pt. Has noted bruising on her R lower rib area the is yellow and brown in nature.  Pt. Reports she has no urinary symptoms for a UTI.  Pt. Has no burning or frequency or pressure.  Pt. Reports no UTI symptoms.   Pt. Daughtter is reporting the poss. UTI and is not here with the Pt. Nor has she seen the Pt. Amanda [name]

## 2024-04-25 DIAGNOSIS — B3731 Acute candidiasis of vulva and vagina: Secondary | ICD-10-CM | POA: Diagnosis not present

## 2024-04-25 DIAGNOSIS — R102 Pelvic and perineal pain: Secondary | ICD-10-CM | POA: Diagnosis not present

## 2024-04-25 DIAGNOSIS — R1904 Left lower quadrant abdominal swelling, mass and lump: Secondary | ICD-10-CM | POA: Diagnosis not present

## 2024-04-29 ENCOUNTER — Telehealth: Payer: Self-pay

## 2024-04-29 NOTE — Telephone Encounter (Signed)
 Copied from CRM [id number]. Topic: General - Other >> Apr 29, 2024  3:03 PM Viola F wrote: Reason for CRM: Patient daughter [name] called wanting to know if Dr. Antonio Meth knows about a hospice order that was put in for the patient. Says people went to patient house for hospice order and gave her a bath but she doesn't remember patient having order for hospice. Please call her to let her know at [phone]

## 2024-04-30 ENCOUNTER — Ambulatory Visit

## 2024-04-30 VITALS — Ht 64.0 in | Wt 126.0 lb

## 2024-04-30 DIAGNOSIS — Z0000 Encounter for general adult medical examination without abnormal findings: Secondary | ICD-10-CM

## 2024-04-30 NOTE — Patient Instructions (Addendum)
 Amanda [name] , Thank you for taking time out of your busy schedule to complete your Annual Wellness Visit with me. I enjoyed our conversation and look forward to speaking with you again next year. I, as well as your care team,  appreciate your ongoing commitment to your health goals. Please review the following plan we discussed and let me know if I can assist you in the future. Your Game plan/ To Do List    Referrals: If you haven't heard from the office you've been referred to, please reach out to them at the phone provided.   Follow up Visits: We will see or speak with you next year for your Next Medicare AWV with our clinical staff 05/06/25 @ 3:10p Have you seen your provider in the last 6 months (3 months if uncontrolled diabetes)?   Clinician Recommendations:  Aim for 30 minutes of exercise or brisk walking, 6-8 glasses of water , and 5 servings of fruits and vegetables each day.       This is a list of the screenings recommended for you:  Health Maintenance  Topic Date Due   Zoster (Shingles) Vaccine (1 of 2) 05/22/1965   COVID-19 Vaccine (4 - 2024-25 season) 05/21/2023   Flu Shot  04/19/2024   Mammogram  07/25/2024   Medicare Annual Wellness Visit  04/30/2025   DTaP/Tdap/Td vaccine (4 - Td or Tdap) 05/09/2026   Pneumococcal Vaccine for age over 61  Completed   DEXA scan (bone density measurement)  Completed   Hepatitis C Screening  Completed   Hepatitis B Vaccine  Aged Out   HPV Vaccine  Aged Out   Meningitis B Vaccine  Aged Out    Advanced directives: (Copy Requested) Please bring a copy of your health care power of attorney and living will to the office to be added to your chart at your convenience. You can mail to [facility] [address] [place], [place] [address] or email to [email or web address] Advance Care Planning is important because it:  [x]  Makes sure you receive the medical care that is consistent with your values, goals, and  preferences  [x]  It provides guidance to your family and loved ones and reduces their decisional burden about whether or not they are making the right decisions based on your wishes.  Follow the link provided in your after visit summary or read over the paperwork we have mailed to you to help you started getting your Advance Directives in place. If you need assistance in completing these, please reach out to us  so that we can help you!  See attachments for Preventive Care and Fall Prevention Tips.

## 2024-04-30 NOTE — Progress Notes (Signed)
 Subjective:   Amanda [name] is a 78 y.o. who presents for a Medicare Wellness preventive visit.  As a reminder, Annual Wellness Visits don't include a physical exam, and some assessments may be limited, especially if this visit is performed virtually. We may recommend an in-person follow-up visit with your provider if needed.  Visit Complete: Virtual I connected with  Amanda [name] on 04/30/24 by a audio enabled telemedicine application and verified that I am speaking with the correct person using two identifiers.  Patient Location: Home  Provider Location: Home Office  I discussed the limitations of evaluation and management by telemedicine. The patient expressed understanding and agreed to proceed.  Vital Signs: Because this visit was a virtual/telehealth visit, some criteria may be missing or patient reported. Any vitals not documented were not able to be obtained and vitals that have been documented are patient reported.    Persons Participating in Visit: Patient.  AWV Questionnaire: No: Patient Medicare AWV questionnaire was not completed prior to this visit.  Cardiac Risk Factors include: advanced age (>28men, >79 women)     Objective:    Today's Vitals   04/30/24 1520 04/30/24 1521  Weight: 126 lb (57.2 kg)   Height: 5' 4 (1.626 m)   PainSc:  0-No pain   Body mass index is 21.63 kg/m.     04/30/2024    3:33 PM 10/05/2023    4:06 PM 01/05/2023   11:02 AM 06/05/2021    1:41 PM 05/14/2019    1:11 PM 11/27/2018   12:44 PM 10/31/2018    2:51 PM  Advanced Directives  Does Patient Have a Medical Advance Directive? Yes Yes Yes Yes Yes Yes  No   Type of Estate agent of [place];Living will Healthcare Power of [employer] of [place];Living will Healthcare Power of [place];Living will Healthcare Power of [place];Living will Healthcare Power of [place];Living will   Does patient want to make changes to medical advance directive?      No - Patient declined    Copy of Healthcare Power of Attorney in Chart? No - copy requested  No - copy requested No - copy requested No - copy requested No - copy requested    Would patient like information on creating a medical advance directive?       No - Patient declined      Data saved with a previous flowsheet row definition    Current Medications (verified) Outpatient Encounter Medications as of 04/30/2024  Medication Sig   acetaminophen  (TYLENOL ) 500 MG tablet Take 500 mg by mouth every 6 (six) hours as needed for fever or mild pain (pain score 1-3).   aspirin EC 325 MG tablet Take 325 mg by mouth daily as needed for moderate pain.   b complex vitamins tablet Take 1 tablet by mouth daily. MEGA FOOD BALANCE SOURCE   CALCIUM-MAGNESIUM-VITAMIN D  PO Take 2 tablets by mouth 2 (two) times daily.   carbidopa -levodopa  (SINEMET  IR) 25-100 MG tablet Take 1 tablet by mouth 3 (three) times daily. 1 whole in morning and 1/2 tablet every 3 hours   cephALEXin  (KEFLEX ) 500 MG capsule Take 1 capsule (500 mg total) by mouth 2 (two) times daily.   ciprofloxacin  (CIPRO ) 500 MG tablet Take 1 tablet (500 mg total) by mouth 2 (two) times daily.   famotidine  (PEPCID ) 20 MG tablet TAKE 1 TABLET BY MOUTH TWICE A DAY   hydrocortisone  (ANUSOL -HC) 25 MG suppository Place 1 suppository (25 mg total) rectally 2 (  two) times daily.   ibuprofen (ADVIL) 200 MG tablet Take 200-400 mg by mouth every 6 (six) hours as needed for headache or mild pain (pain score 1-3).   melatonin 3 MG TABS tablet Take 3 mg by mouth at bedtime as needed.   mirabegron  ER (MYRBETRIQ ) 25 MG TB24 tablet Take 1 tablet (25 mg total) by mouth daily.   NONFORMULARY OR COMPOUNDED ITEM Please keep area in groin clean and dry-- use the nystatin  powder/ cream bid   nystatin  (MYCOSTATIN /NYSTOP ) powder APPLY TOPICALLY 3 TIMES A DAY   nystatin  cream (MYCOSTATIN ) Apply 1 Application topically 2 (two) times daily.   nystatin  cream (MYCOSTATIN ) Apply 1  Application topically 2 (two) times daily.   nystatin  powder Apply 1 Application topically 3 (three) times daily.   oseltamivir  (TAMIFLU ) 75 MG capsule Take 1 capsule (75 mg total) by mouth daily.   pramipexole  (MIRAPEX ) 0.75 MG tablet Take 0.375 mg by mouth 3 (three) times daily.   Probiotic Product (PROBIOTIC DAILY) CAPS Take 1 capsule by mouth daily as needed (indigestion).   Propylene Glycol-Glycerin (SOOTHE OP) Place 2 drops into both eyes daily as needed (dry eyes).   rivastigmine (EXELON) 1.5 MG capsule Take 1.5 mg by mouth 2 (two) times daily.   sertraline  (ZOLOFT ) 50 MG tablet Take 1 tablet (50 mg total) by mouth at bedtime. (Patient taking differently: Take 100 mg by mouth at bedtime.)   No facility-administered encounter medications on file as of 04/30/2024.    Allergies (verified) Penicillins   History: Past Medical History:  Diagnosis Date   Breast cancer, left breast (HCC)    Frequency of urination    GAD (generalized anxiety disorder)    Osteopenia    Parkinson disease (HCC)    neurologist-  dr charolett mar--  right side predominant   Parkinson disease (HCC)    Prolapse of vaginal wall    Urgency of urination    Wears glasses    Past Surgical History:  Procedure Laterality Date   ABDOMINAL HYSTERECTOMY     BREAST BIOPSY Left 07/2017   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   COLONOSCOPY  last one 2007   CYSTOCELE REPAIR N/A 01/11/2016   Procedure: ANTERIOR REPAIR (CYSTOCELE) COLOPLAST FASCIA  SACROSPINOUS FIXATION;  Surgeon: Arlena Gal, MD;  Location: [facility];  Service: Urology;  Laterality: N/A;   CYSTOSCOPY W/ URETERAL STENT PLACEMENT Bilateral 01/11/2016   Procedure: CYSTOSCOPY WITH BILATERAL RETROGRADE PYELOGRAM/URETERAL STENT PLACEMENT;  Surgeon: Arlena Gal, MD;  Location: [facility];  Service: Urology;  Laterality: Bilateral;   ESOPHAGOGASTRODUODENOSCOPY  2007   EXTERNAL EAR SURGERY Bilateral age 63    LAPAROSCOPIC OVARIAN CYSTECTOMY  March 2008   unilateral   MASTECTOMY Left 2018   PUBOVAGINAL SLING N/A 01/11/2016   Procedure:  ALTIS SLING SINGLE INCISION;  Surgeon: Arlena Gal, MD;  Location: [facility];  Service: Urology;  Laterality: N/A;   RECTOCELE REPAIR N/A 01/11/2016   Procedure: POSTERIOR REPAIR (RECTOCELE), PERINEAPLASTY;  Surgeon: Rosaline Cobble, MD;  Location: [facility];  Service: Gynecology;  Laterality: N/A;   SIMPLE MASTECTOMY WITH AXILLARY SENTINEL NODE BIOPSY Left 11/08/2018   Procedure: LEFT SIMPLE MASTECTOMY WITH LEFT  AXILLARY SENTINEL NODE MAPPING;  Surgeon: Vanderbilt Ned, MD;  Location: [facility];  Service: General;  Laterality: Left;   VAGINAL HYSTERECTOMY N/A 01/11/2016   Procedure: TOTAL VAGINAL HYSTERECTOMY ;  Surgeon: Rosaline Cobble, MD;  Location: [facility];  Service: Gynecology;  Laterality:  N/A;   Family History  Problem Relation Age of Onset   Heart disease Mother        enlarged heart   Cancer Father        pancreatic   COPD Sister    Pulmonary embolism Sister    Edema Sister        Ankles   Heart disease Brother    Polycystic kidney disease Son    Social History   Socioeconomic History   Marital status: Married    Spouse name: Vinie   Number of children: 3   Years of education: College   Highest education level: Not on file  Occupational History   Occupation: retired  Tobacco Use   Smoking status: Never   Smokeless tobacco: Never  Vaping Use   Vaping status: Never Used  Substance and Sexual Activity   Alcohol use: No   Drug use: No   Sexual activity: Not on file  Other Topics Concern   Not on file  Social History Narrative   Pt lives at home with family.Caffeine Use: some, occasionally   Has Parkinson's   Social Drivers of Health   Financial Resource Strain: Low Risk  (04/30/2024)   Overall Financial Resource Strain (CARDIA)    Difficulty of Paying Living Expenses: Not  hard at all  Food Insecurity: No Food Insecurity (04/30/2024)   Hunger Vital Sign    Worried About Running Out of Food in the Last Year: Never true    Ran Out of Food in the Last Year: Never true  Transportation Needs: No Transportation Needs (04/30/2024)   PRAPARE - Administrator, Civil Service (Medical): No    Lack of Transportation (Non-Medical): No  Physical Activity: Inactive (04/30/2024)   Exercise Vital Sign    Days of Exercise per Week: 0 days    Minutes of Exercise per Session: 0 min  Stress: No Stress Concern Present (04/30/2024)   [employer] of Occupational Health - Occupational Stress Questionnaire    Feeling of Stress: Not at all  Social Connections: Socially Integrated (04/30/2024)   Social Connection and Isolation Panel    Frequency of Communication with Friends and Family: More than three times a week    Frequency of Social Gatherings with Friends and Family: More than three times a week    Attends Religious Services: More than 4 times per year    Active Member of [employer] or Organizations: Yes    Attends Engineer, structural: More than 4 times per year    Marital Status: Married    Tobacco Counseling Counseling given: Not Answered    Clinical Intake:  Pre-visit preparation completed: Yes  Pain : No/denies pain Pain Score: 0-No pain     BMI - recorded: 21.63 Nutritional Status: BMI of 19-24  Normal Nutritional Risks: None Diabetes: No  No results found for: HGBA1C   How often do you need to have someone help you when you read instructions, pamphlets, or other written materials from your doctor or pharmacy?: 1 - Never  Interpreter Needed?: No  Information entered by :: Rojelio Blush LPN   Activities of Daily Living     04/30/2024    3:29 PM  In your present state of health, do you have any difficulty performing the following activities:  Hearing? 0  Vision? 0  Difficulty concentrating or making decisions? 0  Walking or  climbing stairs? 1  Comment Uses a Walker. Technical sales engineer or bathing? 0  Doing errands, shopping? 0  Preparing Food and eating ? N  Using the Toilet? N  In the past six months, have you accidently leaked urine? Y  Comment Wears Pads. Followed by medical attention  Do you have problems with loss of bowel control? N  Managing your Medications? N  Managing your Finances? N  Housekeeping or managing your Housekeeping? N    Patient Care Team: Antonio Meth, Jamee SAUNDERS, DO as PCP - General Buck Saucer, MD as Consulting Physician (Neurology) Mat Browning, MD as Consulting Physician (Obstetrics and Gynecology) Claudene Arthea HERO, DO as Consulting Physician (Family Medicine) Rosalia Hollering, MD as Consulting Physician (Neurology) Waylan Cain, MD as Consulting Physician (Ophthalmology) Odean Potts, MD as Consulting Physician (Hematology and Oncology) Izell Domino, MD as Attending Physician (Radiation Oncology) Vanderbilt Ned, MD as Consulting Physician (General Surgery) Georgina Barnie DEL, LCSW as Social Worker (Licensed Clinical Social Worker)  I have updated your Care Teams any recent Medical Services you may have received from other providers in the past year.     Assessment:   This is a routine wellness examination for Amanda [name].  Hearing/Vision screen Hearing Screening - Comments:: Denies hearing difficulties   Vision Screening - Comments:: Wears rx glasses - up to date with routine eye exams with  Dr Waylan   Goals Addressed               This Visit's Progress     Increase physical activity (pt-stated)        Get more active.       Depression Screen     04/30/2024    3:29 PM 01/05/2023   11:07 AM 09/26/2022   10:13 AM 06/05/2021    1:28 PM 12/14/2020    9:29 AM 05/14/2019    1:12 PM 05/03/2018   11:41 AM  PHQ 2/9 Scores  PHQ - 2 Score 0 0 0 0 0 0 0    Fall Risk     04/30/2024    3:31 PM 01/05/2023   11:05 AM 06/07/2022   11:17 AM 06/05/2021     1:30 PM 05/04/2021    2:45 PM  Fall Risk   Falls in the past year? 1 1 1 1 1   Number falls in past yr: 0 1 1 1 1   Injury with Fall? 1 0 1 1 1   Comment Injured Ribs. Followed by medical attention      Risk for fall due to : Other (Comment) Impaired balance/gait;Impaired mobility History of fall(s);Impaired balance/gait Impaired balance/gait;History of fall(s) History of fall(s);Impaired balance/gait  Risk for fall due to: Comment Leaned over out of Wheelchair.      Follow up Falls evaluation completed Falls evaluation completed Falls evaluation completed;Falls prevention discussed  Education provided;Falls prevention discussed  Falls evaluation completed      Data saved with a previous flowsheet row definition    MEDICARE RISK AT HOME:  Medicare Risk at Home Any stairs in or around the home?: Yes If so, are there any without handrails?: No Home free of loose throw rugs in walkways, pet beds, electrical cords, etc?: Yes Adequate lighting in your home to reduce risk of falls?: Yes Life alert?: No Use of a cane, walker or w/c?: Yes Grab bars in the bathroom?: Yes Shower chair or bench in shower?: Yes Elevated toilet seat or a handicapped toilet?: No  TIMED UP AND GO:  Was the test performed?  No  Cognitive Function: 6CIT completed    01/05/2023   11:07 AM 05/14/2019  1:27 PM 05/03/2018   11:44 AM 05/02/2017   12:02 PM  MMSE - Mini Mental State Exam  Not completed: Unable to complete Refused    Orientation to time   5 4   Orientation to time comments    Disoriented to date, off by 1 day.   Orientation to Place   5 5   Registration   3 3   Attention/ Calculation   5 5   Recall   2 3   Language- name 2 objects   2 2   Language- repeat   1 1  Language- follow 3 step command   3 3   Language- read & follow direction   1 1   Write a sentence   1 1   Copy design    1   Total score    29      Data saved with a previous flowsheet row definition        04/30/2024    3:33 PM   6CIT Screen  What Year? 0 points  What month? 0 points  What time? 0 points  Count back from 20 0 points  Months in reverse --  Repeat phrase 4 points    Immunizations Immunization History  Administered Date(s) Administered   Fluad Quad(high Dose 65+) 05/24/2019, 06/17/2021, 06/07/2022   Fluad Trivalent(High Dose 65+) 10/02/2023   Influenza Split 08/17/2011   Influenza Whole 10/21/2009   Influenza, High Dose Seasonal PF 05/18/2017, 08/02/2018   Influenza,inj,Quad PF,6+ Mos 07/12/2013   Moderna Sars-Covid-2 Vaccination 10/24/2019, 11/22/2019, 08/25/2020   Pneumococcal Conjugate-13 03/05/2015   Pneumococcal Polysaccharide-23 03/04/2016   Respiratory Syncytial Virus Vaccine ,Recomb Aduvanted(Arexvy ) 09/26/2022   Td 04/22/2004, 10/21/2009   Tdap 05/09/2016   Zoster, Live 06/26/2008    Screening Tests Health Maintenance  Topic Date Due   Zoster Vaccines- Shingrix (1 of 2) 05/22/1966   COVID-19 Vaccine (4 - 2024-25 season) 05/21/2023   INFLUENZA VACCINE  04/19/2024   MAMMOGRAM  07/25/2024   Medicare Annual Wellness (AWV)  04/30/2025   DTaP/Tdap/Td (4 - Td or Tdap) 05/09/2026   Pneumococcal Vaccine: 50+ Years  Completed   DEXA SCAN  Completed   Hepatitis C Screening  Completed   Hepatitis B Vaccines  Aged Out   HPV VACCINES  Aged Out   Meningococcal B Vaccine  Aged Out    Health Maintenance  Health Maintenance Due  Topic Date Due   Zoster Vaccines- Shingrix (1 of 2) 05/22/1966   COVID-19 Vaccine (4 - 2024-25 season) 05/21/2023   INFLUENZA VACCINE  04/19/2024   Health Maintenance Items Addressed:   Additional Screening:  Vision Screening: Recommended annual ophthalmology exams for early detection of glaucoma and other disorders of the eye. Would you like a referral to an eye doctor? No    Dental Screening: Recommended annual dental exams for proper oral hygiene  Community Resource Referral / Chronic Care Management: CRR required this visit?  No   CCM required  this visit?  No   Plan:    I have personally reviewed and noted the following in the patient's chart:   Medical and social history Use of alcohol, tobacco or illicit drugs  Current medications and supplements including opioid prescriptions. Patient is not currently taking opioid prescriptions. Functional ability and status Nutritional status Physical activity Advanced directives List of other physicians Hospitalizations, surgeries, and ER visits in previous 12 months Vitals Screenings to include cognitive, depression, and falls Referrals and appointments  In addition, I have reviewed and discussed  with patient certain preventive protocols, quality metrics, and best practice recommendations. A written personalized care plan for preventive services as well as general preventive health recommendations were provided to patient.   Rojelio LELON Blush, LPN   1/87/7974   After Visit Summary: (MyChart) Due to this being a telephonic visit, the after visit summary with patients personalized plan was offered to patient via MyChart   Notes: Nothing significant to report at this time.

## 2024-05-02 NOTE — Telephone Encounter (Signed)
 Pt's daughter made aware

## 2024-05-10 ENCOUNTER — Ambulatory Visit: Payer: Self-pay | Admitting: Student

## 2024-05-10 ENCOUNTER — Encounter: Payer: Self-pay | Admitting: Student

## 2024-05-10 ENCOUNTER — Ambulatory Visit (INDEPENDENT_AMBULATORY_CARE_PROVIDER_SITE_OTHER): Admitting: Student

## 2024-05-10 ENCOUNTER — Ambulatory Visit (HOSPITAL_BASED_OUTPATIENT_CLINIC_OR_DEPARTMENT_OTHER)
Admission: RE | Admit: 2024-05-10 | Discharge: 2024-05-10 | Disposition: A | Discharge status: Home / Self Care | Source: Ambulatory Visit | Attending: Student | Admitting: Student

## 2024-05-10 ENCOUNTER — Ambulatory Visit: Admitting: Family Medicine

## 2024-05-10 VITALS — BP 110/70 | HR 70 | Temp 98.0°F | Resp 16

## 2024-05-10 DIAGNOSIS — J189 Pneumonia, unspecified organism: Secondary | ICD-10-CM

## 2024-05-10 DIAGNOSIS — R918 Other nonspecific abnormal finding of lung field: Secondary | ICD-10-CM | POA: Diagnosis not present

## 2024-05-10 DIAGNOSIS — R058 Other specified cough: Secondary | ICD-10-CM | POA: Diagnosis not present

## 2024-05-10 DIAGNOSIS — C50912 Malignant neoplasm of unspecified site of left female breast: Secondary | ICD-10-CM | POA: Diagnosis not present

## 2024-05-10 DIAGNOSIS — J984 Other disorders of lung: Secondary | ICD-10-CM | POA: Diagnosis not present

## 2024-05-10 DIAGNOSIS — R051 Acute cough: Secondary | ICD-10-CM

## 2024-05-10 DIAGNOSIS — R0602 Shortness of breath: Secondary | ICD-10-CM

## 2024-05-10 LAB — POC COVID19 BINAXNOW: SARS Coronavirus 2 Ag: NEGATIVE

## 2024-05-10 MED ORDER — BENZONATATE 100 MG PO CAPS: 100.0000 mg | ORAL_CAPSULE | Freq: Two times a day (BID) | ORAL | 0 refills | Status: AC | PRN

## 2024-05-10 MED ORDER — CEFPODOXIME PROXETIL 200 MG PO TABS: 200.0000 mg | ORAL_TABLET | Freq: Two times a day (BID) | ORAL | 0 refills | Status: AC

## 2024-05-10 MED ORDER — AZITHROMYCIN 250 MG PO TABS: ORAL_TABLET | ORAL | 0 refills | Status: AC

## 2024-05-10 NOTE — Progress Notes (Addendum)
 Chief Complaint  Patient presents with   Cough    Occ productive cough, sxs started Monday. Husband has pneumonia and was tested for everything and was negative     [name] here for URI complaints. Presents with caregiver to OV.  Onset of symptoms Tuesday, ache, fatigue,   Duration: 3 days - She has been taking Theraflu, Mucinex Associated symptoms: cough, congestion, subjective fever Denies: sinus headache, sinus congestion, ear fullness, ear pain, and sore throat Treatment to date:   Sick contacts: Yes  Husand was treated with PNA  Past Medical History:  Diagnosis Date   Breast cancer, left breast (HCC)    Frequency of urination    GAD (generalized anxiety disorder)    Osteopenia    Parkinson disease (HCC)    neurologist-  dr charolett mar--  right side predominant   Parkinson disease (HCC)    Prolapse of vaginal wall    Urgency of urination    Wears glasses     Objective BP 110/70 (BP Location: Left Arm, Patient Position: Sitting, Cuff Size: Normal)   Pulse 70   Temp 98 F (36.7 C) (Oral)   Resp 16   SpO2 97%  General: Awake, alert, appears stated age +Pt in wheelchair,  HEENT: AT, , ears patent b/l and TM's neg, nares patent w/o discharge, pharynx pink and without exudates, MMM Neck: No masses or asymmetry Heart: RRR Lungs: Clear, diminished. RLL-Diminished Psych: Age appropriate judgment and insight, normal mood and affect  Acute cough - Plan: POC COVID-19, benzonatate  (TESSALON ) 100 MG capsule  Shortness of breath - Plan: azithromycin  (ZITHROMAX ) 250 MG tablet, DG Chest 2 View, CANCELED: DG Chest 2 View Covid- NEG CXR  IMPRESSION: 1. Chronic scarring in the lung bases with probable thickening of pleura in the left costophrenic angle. 2. Suggestion of patchy developing airspace disease in the right lung base, possibly pneumonia.  Rx- Zpack, Cefpodoxime , Tesselon Pearls Mucinex prn Continue to push fluids, practice good hand hygiene, cover mouth  when coughing. F/u prn. If starting to experience persistent fevers,  or worsening shortness of breath, seek immediate care. Unable to reach patient directly; left voicemail for emergency contact (daughter, [name]) regarding the chest X-ray results and the addition of Cefpodoxime  abx. Provided detailed instructions via voicemail on taking both the Z-Pak and Cefpodoxime , as well as symptoms that warrant immediate medical attention, including worsening shortness of breath, persistant fever, or systemic symptoms.   Harlene LITTIE Jolly, DNP, AGNP-C 05/10/24 5:50 PM

## 2024-05-16 ENCOUNTER — Telehealth: Payer: Self-pay | Admitting: *Deleted

## 2024-05-16 NOTE — Telephone Encounter (Signed)
 Spoke with patient and they stated they did not have medication but will pickup today at pharmacy.  She stated that she still has a little cough.

## 2024-05-16 NOTE — Telephone Encounter (Signed)
 Amanda Harlene CROME, NP  [name], CMA Can you please call patient or their emergency contact to ensure she has taken both abx? I called Friday after work but was uanble to reach anyone. I left voicemail but appreciate if you can follow up on this.  CXR: Findings suggest a developing hazy infiltrate at the right lung base, concerning for possible right lower lobe pneumonia. Zpack and Cefpodoxime  Rx has been sent to the pharmacy. MyChart message sent to patient. Unable to reach patient by phone; left voicemail  Also left a detailed voicemail for emergency contact, daughter [name], regarding the results, treatment plan, and symptoms that require immediate care.

## 2024-05-21 NOTE — Telephone Encounter (Signed)
 Tried calling to the home and husband and he picked up and did not say much.  Called pharmacy and they stated that pt did pick up medication.

## 2024-05-30 ENCOUNTER — Ambulatory Visit: Admitting: Podiatry

## 2024-06-14 DIAGNOSIS — H524 Presbyopia: Secondary | ICD-10-CM | POA: Diagnosis not present

## 2024-06-14 DIAGNOSIS — H2513 Age-related nuclear cataract, bilateral: Secondary | ICD-10-CM | POA: Diagnosis not present

## 2024-06-18 ENCOUNTER — Ambulatory Visit: Admitting: Podiatry

## 2024-06-21 ENCOUNTER — Ambulatory Visit (INDEPENDENT_AMBULATORY_CARE_PROVIDER_SITE_OTHER): Admitting: Student

## 2024-06-21 ENCOUNTER — Encounter: Payer: Self-pay | Admitting: Student

## 2024-06-21 VITALS — BP 102/65 | HR 93 | Temp 98.1°F

## 2024-06-21 DIAGNOSIS — R300 Dysuria: Secondary | ICD-10-CM | POA: Diagnosis not present

## 2024-06-21 DIAGNOSIS — N898 Other specified noninflammatory disorders of vagina: Secondary | ICD-10-CM

## 2024-06-21 LAB — POC URINALSYSI DIPSTICK (AUTOMATED)
Bilirubin, UA: NEGATIVE
Blood, UA: NEGATIVE
Glucose, UA: NEGATIVE
Ketones, UA: NEGATIVE
Nitrite, UA: NEGATIVE
Protein, UA: NEGATIVE
Spec Grav, UA: 1.01 (ref 1.010–1.025)
Urobilinogen, UA: 0.2 U/dL
pH, UA: 7 (ref 5.0–8.0)

## 2024-06-21 MED ORDER — NITROFURANTOIN MONOHYD MACRO 100 MG PO CAPS: 100.0000 mg | ORAL_CAPSULE | Freq: Two times a day (BID) | ORAL | 0 refills | Status: AC

## 2024-06-21 MED ORDER — FLUCONAZOLE 150 MG PO TABS: 150.0000 mg | ORAL_TABLET | Freq: Every day | ORAL | 0 refills | Status: AC

## 2024-06-21 NOTE — Progress Notes (Signed)
 No chief complaint on file.   Amanda [name] is a 78 year old female with Parkinson's disease who presents with symptoms suggestive of a urinary tract infection. Present to visit with caregiver.   Reports itching and a burning sensation. Denies vaginal discharge. There is no hematuria or increased urinary frequency. She sometimes experiences yeast infections following antibiotic use, but there has been no recent antibiotic use.  Her caregiver notes increased confusion.  Her daughter, [name], is involved in her care and can access her MyChart for communication regarding any changes in her treatment plan.   Patient denies fever, chills, SOB, CP, palpitations, dyspnea, edema, HA, vision changes, N/V/D, abdominal pain, rash, weight changes.  Past Medical History:  Diagnosis Date   Breast cancer, left breast (HCC)    Frequency of urination    GAD (generalized anxiety disorder)    Osteopenia    Parkinson disease (HCC)    neurologist-  dr charolett mar--  right side predominant   Parkinson disease (HCC)    Prolapse of vaginal wall    Urgency of urination    Wears glasses      BP 102/65   Pulse 93   Temp 98.1 F (36.7 C)   SpO2 94%  General: Awake, alert, appears stated age Heart: RRR Lungs: CTAB, normal respiratory effort, no accessory muscle usage Abd: BS+, soft, NT, ND, no masses or organomegaly MSK: No CVA tenderness,  Psych: Age appropriate judgment and insight  Dysuria - Plan: nitrofurantoin , macrocrystal-monohydrate, (MACROBID ) 100 MG capsule, fluconazole (DIFLUCAN) 150 MG tablet, Urine Culture, POCT Urinalysis Dipstick (Automated)  Vaginal itching   Dysuria Suspected UTI with burning sensation, no hematuria. - Order urine culture to confirm diagnosis and guide antibiotic therapy. - Rx- Macrobid  - Encourage increased water  intake  Vaginal Itching - Rx Diflucan  - Advise to monitor symptoms and return if symptoms persist after treatment.  Stay hydrated. Seek  immediate care if pt starts to develop fevers, new/worsening symptoms, uncontrollable N/V. F/u prn. The patient voiced understanding and agreement to the plan.  Harlene LITTIE Jolly, DNP, AGNP-C 06/21/24 4:17 PM

## 2024-06-23 ENCOUNTER — Ambulatory Visit: Payer: Self-pay | Admitting: Student

## 2024-06-23 LAB — URINE CULTURE
MICRO NUMBER:: 17053946
SPECIMEN QUALITY:: ADEQUATE

## 2024-06-23 LAB — EXTRA URINE SPECIMEN

## 2024-07-05 ENCOUNTER — Other Ambulatory Visit: Payer: Self-pay | Admitting: Family Medicine

## 2024-07-05 DIAGNOSIS — B356 Tinea cruris: Secondary | ICD-10-CM

## 2024-07-06 ENCOUNTER — Encounter: Payer: Self-pay | Admitting: Podiatry

## 2024-07-08 ENCOUNTER — Other Ambulatory Visit: Payer: Self-pay | Admitting: Family Medicine

## 2024-07-08 ENCOUNTER — Telehealth: Payer: Self-pay

## 2024-07-08 DIAGNOSIS — B356 Tinea cruris: Secondary | ICD-10-CM

## 2024-07-08 MED ORDER — NYSTATIN 100000 UNIT/GM EX CREA: TOPICAL_CREAM | Freq: Two times a day (BID) | CUTANEOUS | 0 refills | Status: AC

## 2024-07-08 MED ORDER — NYSTATIN 100000 UNIT/GM EX POWD: 1.0000 | Freq: Three times a day (TID) | CUTANEOUS | 3 refills | Status: AC

## 2024-07-08 NOTE — Telephone Encounter (Signed)
 Initial Comment Caller states the pt had an Rx for their ongoing yeast and incontinence issue, the caller was told the pt needs another Rx for their yeast infection. Translation No Nurse Assessment Nurse: Signa, RN, Devere Shell Date/Time Titus Time): 07/05/2024 7:42:45 PM Confirm and document reason for call. If symptomatic, describe symptoms. ---Caller states patient has a yeast infection. Caller states that patient is out of her Nystatin  cream and she needs a refill. Patient has vaginal itching. Caller states no other sx. Does the patient have any new or worsening symptoms? ---Yes Will a triage be completed? ---Yes Related visit to physician within the last 2 weeks? ---Yes Does the PT have any chronic conditions? (i.e. diabetes, asthma, this includes High risk factors for pregnancy, etc.) ---Yes List chronic conditions. ---Parkinson's Disease Is this a behavioral health or substance abuse call? ---No Guidelines Guideline Title Affirmed Question Affirmed Notes Nurse Date/Time (Eastern Time) Vaginal Symptoms MODERATESEVERE itching (i.e., interferes with school, work, or sleep) Signa, RN, Devere Shell 07/05/2024 7:44:27 PM PLEASE NOTE: All timestamps contained within this report are represented as [place] Standard Time. CONFIDENTIALTY NOTICE: This fax transmission is intended only for the addressee. It contains information that is legally privileged, confidential or otherwise protected from use or disclosure. If you are not the intended recipient, you are strictly prohibited from reviewing, disclosing, copying using or disseminating any of this information or taking any action in reliance on or regarding this information. If you have received this fax in error, please notify us  immediately by telephone so that we can arrange for its return to us . Phone: [phone], Toll-Free: [phone], Fax: [phone] MARTHA_MARTIN 05/28/1946 Page: 1 of2 CallId:  [id number] Disp. Time Titus Time) Disposition Final User 07/05/2024 7:48:15 PM See PCP within 24 Hours Yes Signa, RN, Devere Shell Final Disposition 07/05/2024 7:48:15 PM See PCP within 24 Hours Yes Signa, RN, Devere Shell Caller Disagree/Comply Disagree Caller Understands Yes PreDisposition Call Doctor Care Advice Given Per Guideline SEE PCP WITHIN 24 HOURS: * IF OFFICE WILL BE CLOSED: You need to be seen within the next 24 hours. A clinic or an urgent care center is often a good source of care if your doctor's office is closed or you can't get an appointment. CLEAN THE AREA: * Wash the area once very well with un-scented soap and water  to remove any irritants. CALL BACK IF: * Fever or severe pain * You become worse CARE ADVICE given per Vaginal Symptoms (Adult) guideline. Comments User: Devere Shell, Signa, RN Date/Time Titus Time): 07/05/2024 7:44:44 PM Caller states that patient has urinary incontinence, but that is normal for her. User: Devere Shell, Signa, RN Date/Time Titus Time): 07/05/2024 7:48:14 PM Caller states that the patient's aides will not assist in visits so they will f/u with PCP on Monday. Referrals GO TO FACILITY REFUSED

## 2024-07-09 NOTE — Telephone Encounter (Signed)
 Called pt's daughter, [name], left detail voicemail about medication and pharmacy.

## 2024-07-11 ENCOUNTER — Encounter: Payer: Self-pay | Admitting: Podiatry

## 2024-07-11 ENCOUNTER — Ambulatory Visit (INDEPENDENT_AMBULATORY_CARE_PROVIDER_SITE_OTHER): Admitting: Podiatry

## 2024-07-11 DIAGNOSIS — B351 Tinea unguium: Secondary | ICD-10-CM

## 2024-07-11 DIAGNOSIS — G629 Polyneuropathy, unspecified: Secondary | ICD-10-CM

## 2024-07-11 DIAGNOSIS — M21619 Bunion of unspecified foot: Secondary | ICD-10-CM

## 2024-07-11 DIAGNOSIS — M79674 Pain in right toe(s): Secondary | ICD-10-CM

## 2024-07-11 DIAGNOSIS — M79675 Pain in left toe(s): Secondary | ICD-10-CM

## 2024-07-11 DIAGNOSIS — M2042 Other hammer toe(s) (acquired), left foot: Secondary | ICD-10-CM

## 2024-07-11 DIAGNOSIS — Z23 Encounter for immunization: Secondary | ICD-10-CM | POA: Diagnosis not present

## 2024-07-11 DIAGNOSIS — M2041 Other hammer toe(s) (acquired), right foot: Secondary | ICD-10-CM

## 2024-07-11 NOTE — Progress Notes (Signed)
 This patient returns to my office for at risk foot care.  This patient requires this care by a professional since this patient will be at risk due to having neuropathy.  This patient is unable to cut nails herself since the patient cannot reach her nails.These nails are painful walking and wearing shoes.  This patient presents for at risk foot care today.  General Appearance  Alert, conversant and in no acute stress.  Vascular  Dorsalis pedis and posterior tibial  pulses are palpable  bilaterally.  Capillary return is within normal limits  bilaterally. Temperature is within normal limits  bilaterally.  Neurologic  Senn-Weinstein monofilament wire test within normal limits  bilaterally. Muscle power within normal limits bilaterally.  Nails Thick disfigured discolored nails with subungual debris  from hallux to fifth toes bilaterally. No evidence of bacterial infection or drainage bilaterally.  Orthopedic  No limitations of motion  feet .  No crepitus or effusions noted.  HAV  B/L.  Hammer toes  B/L.  Skin  normotropic skin with no porokeratosis noted bilaterally.  No signs of infections or ulcers noted.     Onychomycosis  Pain in right toes  Pain in left toes  Consent was obtained for treatment procedures.   Mechanical debridement of nails 1-5  bilaterally performed with a nail nipper.  Filed with dremel without incident.    Return office visit   3 months                   Told patient to return for periodic foot care and evaluation due to potential at risk complications.   Ruffin Cotton DPM

## 2024-07-13 ENCOUNTER — Encounter: Payer: Self-pay | Admitting: Family Medicine

## 2024-07-15 ENCOUNTER — Encounter: Payer: Self-pay | Admitting: Family Medicine

## 2024-07-21 ENCOUNTER — Other Ambulatory Visit: Payer: Self-pay | Admitting: Family Medicine

## 2024-07-21 DIAGNOSIS — G20B2 Parkinson's disease with dyskinesia, with fluctuations: Secondary | ICD-10-CM

## 2024-07-25 ENCOUNTER — Telehealth: Payer: Self-pay

## 2024-07-25 NOTE — Progress Notes (Signed)
 Complex Care Management Note  Care Guide Note 07/25/2024 Name: AMANADA PHILBRICK MRN: [id number] DOB: 01-19-1946  SYANN CUPPLES is a 78 y.o. year old female who sees Antonio Meth, Jamee SAUNDERS, DO for primary care. I reached out to [name] by phone today to offer complex care management services.  Ms. Basques was given information about Complex Care Management services today including:   The Complex Care Management services include support from the care team which includes your Nurse Care Manager, Clinical Social Worker, or Pharmacist.  The Complex Care Management team is here to help remove barriers to the health concerns and goals most important to you. Complex Care Management services are voluntary, and the patient may decline or stop services at any time by request to their care team member.   Complex Care Management Consent Status: Patient agreed to services and verbal consent obtained.   Follow up plan:  Telephone appointment with complex care management team member scheduled for:  07/29/24 at 2:00 p.m.   Encounter Outcome:  Patient Scheduled  Dreama [facility]  [facility], [facility] VBCI Assistant Direct Dial: [phone]  Fax: [phone]

## 2024-07-25 NOTE — Progress Notes (Signed)
 Complex Care Management Note Care Guide Note  07/25/2024 Name: Amanda [name] MRN: [id number] DOB: 07/26/1946   Complex Care Management Outreach Attempts: An unsuccessful telephone outreach was attempted today to offer the patient information about available complex care management services.  Follow Up Plan:  Additional outreach attempts will be made to offer the patient complex care management information and services.   Encounter Outcome:  No Answer  Dreama [facility]  [facility], [facility] VBCI Assistant Direct Dial: [phone]  Fax: [phone]

## 2024-07-29 ENCOUNTER — Other Ambulatory Visit: Payer: Self-pay

## 2024-07-29 NOTE — Patient Instructions (Signed)
 Visit Information  Thank you for taking time to visit with me today. Please don't hesitate to contact me if I can be of assistance to you before our next scheduled appointment.  Our next appointment is by telephone on 08/12/24 at 2:30 PM Please call the care guide team at [phone] if you need to cancel or reschedule your appointment.   Following is a copy of your care plan:   Goals Addressed             This Visit's Progress    VBCI RN Care Plan   On track    Problems:  Chronic Disease Management support and education needs related to Parkinson's Disease Caregiving concerns  Goal: Over the next 30 days the patient's daughter(s) will demonstrate Ongoing adherence to prescribed treatment plan for Parkinson's Disease as evidenced by report of patient remaining free from falls work with child psychotherapist to address caregiving concerns related to the management of Parkinson's Disease as evidenced by review of electronic medical record and patient or social worker report     Locate an alternative home health agency as evidenced by daughter's report  Interventions:   Evaluation of current treatment plan related to Parkinson's Disease, Caregiving concerns self-management and patient's adherence to plan as established by provider. Discussed plans with patient for ongoing care management follow up and provided patient with direct contact information for care management team Evaluation of current treatment plan related to Parkinson's Disease and patient's adherence to plan as established by provider Social Work referral for additional resources/guidance regarding caregiving concerns Directed patient's daughter to [facility] Department of Health and Health And Safety Inspector Complaint Intake and Health Care Personnel Investigations Section and advised to file a complaint towards home health agency regarding concerns for patient's safety. Advised identifying specific examples, and if possible including documents to  back up complaints such as documentation from provider visits, text messages, phone calls, etc.  Encouraged patient's daughter to look for a different home health agency given current concerns. She reports that she has looked into another agency based in [place], but has not outreached to them as of yet  Patient Self-Care Activities:  Attend all scheduled provider appointments Call provider office for new concerns or questions  Work with the social worker to address care coordination needs and will continue to work with the clinical team to address health care and disease management related needs  Plan:  Telephone follow up appointment with care management team member scheduled for:  08/12/24 at 2:30 PM             Please call the Suicide and Crisis Lifeline: 988 call 1-800-273-TALK (toll free, 24 hour hotline) if you are experiencing a Mental Health or Behavioral Health Crisis or need someone to talk to.  Patien'ts daughter verbalized understanding of Care plan and visit instructions communicated this visit  [name], RN MSN [place]  [facility] RN Care Manager Direct Dial: [phone]  Fax: [phone]

## 2024-07-29 NOTE — Patient Outreach (Signed)
 Complex Care Management   Visit Note  07/29/2024  Name:  Amanda [name] MRN: [id number] DOB: [birth year]  Situation: Referral received for Complex Care Management related to Parkinson's Disease I obtained verbal consent from patient's daughter/primary contact.  Visit completed with patient's daughter Amanda [name]  on the phone  Background:   Past Medical History:  Diagnosis Date   Breast cancer, left breast (HCC)    Frequency of urination    GAD (generalized anxiety disorder)    Osteopenia    Parkinson disease [facility])    neurologist-  dr charolett mar--  right side predominant   Parkinson disease (HCC)    Prolapse of vaginal wall    Urgency of urination    Wears glasses     Assessment: Patient Reported Symptoms:  Cognitive Cognitive Status: Requires Assistance Decision Making, Struggling with memory recall, Poor judgment in daily scenarios, Able to follow simple commands Cognitive/Intellectual Conditions Management [RPT]: Other Other: Patient's daughter reports history of dementia   Health Maintenance Behaviors: Annual physical exam, Healthy diet Health Facilitated by: Healthy diet, Rest  Neurological Neurological Review of Symptoms: Other:, Weakness Oher Neurological Symptoms/Conditions [RPT]: History of Parkinson's Disease, dementia per patient's daughter Neurological Management Strategies: Medication therapy, Routine screening Neurological Comment: Weakness as the day goes on. Patient follows with neurology regularly  HEENT HEENT Symptoms Reported: No symptoms reported HEENT Management Strategies: Routine screening    Cardiovascular Cardiovascular Symptoms Reported: No symptoms reported Does patient have uncontrolled Hypertension?: No Cardiovascular Management Strategies: Routine screening  Respiratory Respiratory Symptoms Reported: No symptoms reported Respiratory Management Strategies: Routine screening  Endocrine Endocrine Symptoms Reported: Weakness or fatigue Is  patient diabetic?: No    Gastrointestinal Gastrointestinal Symptoms Reported: No symptoms reported Additional Gastrointestinal Details: Patient's daughter reports a good appetite. She reports patient is having regular BMs Gastrointestinal Management Strategies: Incontinence garment/pad, Coping strategies    Genitourinary Genitourinary Symptoms Reported: Other, Incontinence Other Genitourinary Symptoms: Patient's daughter reports issues related to recurrent UTI Genitourinary Management Strategies: Incontinence garment/pad  Integumentary Integumentary Symptoms Reported: Other Other Integumentary Symptoms: Patient's daughter reports issues related to recurrent yeast infections. She reports they have Nystatin  cream to use between changes Skin Management Strategies: Coping strategies, Routine screening, Medication therapy  Musculoskeletal Musculoskelatal Symptoms Reviewed: Difficulty walking, Limited mobility, Unsteady gait, Weakness Additional Musculoskeletal Details: Patient's daughter reports patient complains of neck and back pain, headache. She reports patient favors one side, which she feels contributes to back and neck pain Musculoskeletal Management Strategies: Adequate rest, Coping strategies, Medical device, Routine screening (Wheelchair) Musculoskeletal Comment: Patient's daughter reports she is aware of 2 falls in the past year, but is unsure if there have been more that they have not been notified about Falls in the past year?: Yes Number of falls in past year: 2 or more Was there an injury with Fall?: No Fall Risk Category Calculator: 2 Patient Fall Risk Level: Moderate Fall Risk Patient at Risk for Falls Due to: History of fall(s), Impaired balance/gait, Impaired mobility, Mental status change, Other (Comment) (Parkinson's disease) Fall risk Follow up: Falls evaluation completed, Education provided, Falls prevention discussed  Psychosocial       Quality of Family Relationships:  helpful, involved, supportive Do you feel physically threatened by others?:  (Unable to assess due to speaking with patient's daughter)    07/29/2024    PHQ2-9 Depression Screening   Little interest or pleasure in doing things    Feeling down, depressed, or hopeless    PHQ-2 - Total Score    Trouble  falling or staying asleep, or sleeping too much    Feeling tired or having little energy    Poor appetite or overeating     Feeling bad about yourself - or that you are a failure or have let yourself or your family down    Trouble concentrating on things, such as reading the newspaper or watching television    Moving or speaking so slowly that other people could have noticed.  Or the opposite - being so fidgety or restless that you have been moving around a lot more than usual    Thoughts that you would be better off dead, or hurting yourself in some way    PHQ2-9 Total Score    If you checked off any problems, how difficult have these problems made it for you to do your work, take care of things at home, or get along with other people    Depression Interventions/Treatment      There were no vitals filed for this visit.  Medications Reviewed Today     Reviewed by Arno Rosaline SQUIBB, RN (Registered Nurse) on 07/29/24 at 1455  Med List Status: <None>   Medication Order Taking? Sig Documenting Provider Last Dose Status Informant  acetaminophen  (TYLENOL ) 500 MG tablet [id number] Yes Take 500 mg by mouth every 6 (six) hours as needed for fever or mild pain (pain score 1-3). [provider]  Active Self, Child, Pharmacy Records  aspirin EC 325 MG tablet [id number] Yes Take 325 mg by mouth daily as needed for moderate pain. [provider]  Active Self, Child, Pharmacy Records  b complex vitamins tablet [id number] Yes Take 1 tablet by mouth daily. ME[employer]ANCE SOURCE [provider]  Active Self, Child, Pharmacy Records  benzonatate  (TESSALON ) 100 MG capsule [id number]  Yes Take 1 capsule (100 mg total) by mouth 2 (two) times daily as needed for cough. Wheeler Harlene CROME, NP  Active   CALCIUM-MAGNESIUM-VITAMIN D  PO [id number] Yes Take 2 tablets by mouth 2 (two) times daily. [provider]  Active Self, Child, Pharmacy Records  carbidopa -levodopa  (SINEMET  IR) 25-100 MG tablet [id number] Yes Take 1 tablet by mouth 3 (three) times daily. 1 whole in morning and 1/2 tablet every 3 hours [provider]  Active Self, Child, Pharmacy Records  cephALEXin  (KEFLEX ) 500 MG capsule [id number]  Take 1 capsule (500 mg total) by mouth 2 (two) times daily.  Patient not taking: Reported on 07/29/2024   Antonio Cyndee Jamee JONELLE, DO  Consider Medication Status and Discontinue (Completed Course) Self, Child, Pharmacy Records           Med Note ROZENA, SEBASTIAN   Thu Oct 05, 2023  9:22 PM) Started 10/01/22, patient on fourth day of therapy  ciprofloxacin  (CIPRO ) 500 MG tablet [id number]  Take 1 tablet (500 mg total) by mouth 2 (two) times daily.  Patient not taking: Reported on 07/29/2024   Antonio Cyndee Jamee JONELLE, DO  Consider Medication Status and Discontinue (Completed Course)   famotidine  (PEPCID ) 20 MG tablet [id number] Yes TAKE 1 TABLET BY MOUTH TWICE A DAY  Patient taking differently: as needed.   Antonio Cyndee Jamee JONELLE, DO  Active Self, Child, Pharmacy Records  fluconazole (DIFLUCAN) 150 MG tablet [id number]  Take 1 tablet (150 mg total) by mouth daily. May repeat in 3 days if needed.  Patient not taking: Reported on 07/29/2024   Wheeler Harlene CROME, NP  Consider Medication Status and Discontinue (Completed Course)   hydrocortisone  (ANUSOL -HC) 25 MG suppository [id number]  Place 1 suppository (25 mg total) rectally 2 (two) times daily.  Patient not taking: Reported on 07/29/2024   Antonio Cyndee Jamee JONELLE, DO  Active Self, Child, Pharmacy Records  ibuprofen (ADVIL) 200 MG tablet [id number] Yes Take 200-400 mg by mouth every 6 (six) hours as needed for headache or mild  pain (pain score 1-3). [provider]  Active Self, Child, Pharmacy Records  melatonin 3 MG TABS tablet [id number] Yes Take 3 mg by mouth at bedtime as needed. [provider]  Active Self, Child, Pharmacy Records  mirabegron  ER (MYRBETRIQ ) 25 MG TB24 tablet [id number]  Take 1 tablet (25 mg total) by mouth daily.  Patient not taking: Reported on 07/29/2024   Antonio Cyndee Jamee JONELLE, DO  Active Self, Child, Pharmacy Records  NONFORMULARY OR COMPOUNDED RODRIGO [id number] Yes Please keep area in groin clean and dry-- use the nystatin  powder/ cream bid Antonio Cyndee, Yvonne R, DO  Active   nystatin  (MYCOSTATIN /NYSTOP ) powder [id number] Yes APPLY TO AFFECTED AREA 3 TIMES A DAY Lowne Chase, Yvonne R, DO  Active   nystatin  cream (MYCOSTATIN ) [id number] Yes Apply 1 Application topically 2 (two) times daily. [provider]  Active Self, Child, Pharmacy Records  nystatin  cream (MYCOSTATIN ) [id number] Yes Apply topically 2 (two) times daily. Lowne Chase, Yvonne R, DO  Active   nystatin  powder [id number] Yes Apply 1 Application topically 3 (three) times daily. Antonio Cyndee Jamee JONELLE, DO  Active   oseltamivir  (TAMIFLU ) 75 MG capsule [id number]  Take 1 capsule (75 mg total) by mouth daily.  Patient not taking: Reported on 07/29/2024   Antonio Cyndee Jamee JONELLE, DO  Consider Medication Status and Discontinue (Completed Course)   pramipexole  (MIRAPEX ) 0.75 MG tablet [id number] Yes Take 0.375 mg by mouth 3 (three) times daily. [provider]  Active Self, Child, Pharmacy Records  Probiotic Product (PROBIOTIC DAILY) CAPS [id number] Yes Take 1 capsule by mouth daily as needed (indigestion). [provider]  Active Self, Child, Pharmacy Records  Propylene Glycol-Glycerin (SOOTHE OP) [id number] Yes Place 2 drops into both eyes daily as needed (dry eyes). [provider]  Active Self, Child, Pharmacy Records  rivastigmine (EXELON) 1.5 MG capsule [id number] Yes Take 1.5 mg by mouth 2 (two)  times daily. [provider]  Active Self, Child, Pharmacy Records  sertraline  (ZOLOFT ) 50 MG tablet [id number] Yes Take 1 tablet (50 mg total) by mouth at bedtime.  Patient taking differently: Take 100 mg by mouth at bedtime.   Antonio Cyndee Jamee JONELLE, DO  Active Self, Child, Pharmacy Records            Recommendation:   Continue Current Plan of Care  Follow Up Plan:   Telephone follow up appointment date/time:  08/12/24 at 2:30 PM Referral to Licensed Clinical Social Worker  [name], RN MSN [place]  [facility] Population Health RN Care Manager Direct Dial: [phone]  Fax: [phone]

## 2024-08-09 ENCOUNTER — Telehealth: Payer: Self-pay | Admitting: Licensed Clinical Social Worker

## 2024-08-09 ENCOUNTER — Encounter: Payer: Self-pay | Admitting: Licensed Clinical Social Worker

## 2024-08-09 NOTE — Patient Outreach (Signed)
 LCSW received a callback from patients daughter [name] [place]. [name] stated that Katie missed the appointment and they are interested in rescheduling. [name] wanted to confirm a date and time with [name] and stated she will call LCSW back.  Cena Ligas, LCSW Clinical Social Worker VBCI Population Health

## 2024-08-09 NOTE — Patient Instructions (Signed)
 [name] - I am sorry I was unable to reach you today for our scheduled appointment. I work with Antonio Meth, Jamee SAUNDERS, DO and am calling to support your healthcare needs. Please contact me at [phone] at your earliest convenience. I look forward to speaking with you soon.   Thank you,  Cena Ligas, LCSW Clinical Social Worker [facility] Population Health

## 2024-08-12 ENCOUNTER — Telehealth: Payer: Self-pay

## 2024-08-12 NOTE — Patient Outreach (Signed)
 Care Coordination   08/12/2024 Name: Amanda [name] MRN: [id number] DOB: 1946/07/19   Care Coordination Outreach Attempts:  A telephone outreach was attempted for an appointment today.  Follow Up Plan:  Additional outreach attempts will be made to complete CCM follow-up visit.   Encounter Outcome: Spoke with patient's daughter, [name]. She is unable to complete visit at this time but states she will call RN Care Manager back to reschedule when able. Confirmed she has RN contact number.   [name], RN MSN [place]  [facility] Population Health RN Care Manager Direct Dial: [phone]  Fax: [phone]

## 2024-08-13 ENCOUNTER — Other Ambulatory Visit: Payer: Self-pay | Admitting: Hematology and Oncology

## 2024-08-13 DIAGNOSIS — Z1231 Encounter for screening mammogram for malignant neoplasm of breast: Secondary | ICD-10-CM

## 2024-08-20 ENCOUNTER — Telehealth: Payer: Self-pay

## 2024-08-20 NOTE — Patient Outreach (Signed)
 Call placed to patient's daughter [name] to reschedule LCSW and RN visits. LCSW initial visit scheduled 09/04/24 at 1:30 PM and RN follow-up scheduled 09/06/24 at 2 PM. [name] denies other needs at this time.  [name], RN MSN [place]  [facility] Population Health RN Care Manager Direct Dial: [phone]  Fax: [phone]

## 2024-08-21 ENCOUNTER — Inpatient Hospital Stay: Attending: Hematology and Oncology | Admitting: Hematology and Oncology

## 2024-08-21 VITALS — BP 131/61 | HR 68 | Temp 98.3°F | Resp 18 | Ht 64.0 in | Wt 126.5 lb

## 2024-08-21 DIAGNOSIS — Z923 Personal history of irradiation: Secondary | ICD-10-CM | POA: Insufficient documentation

## 2024-08-21 DIAGNOSIS — G20A1 Parkinson's disease without dyskinesia, without mention of fluctuations: Secondary | ICD-10-CM | POA: Diagnosis not present

## 2024-08-21 DIAGNOSIS — C50512 Malignant neoplasm of lower-outer quadrant of left female breast: Secondary | ICD-10-CM

## 2024-08-21 DIAGNOSIS — Z853 Personal history of malignant neoplasm of breast: Secondary | ICD-10-CM | POA: Insufficient documentation

## 2024-08-21 DIAGNOSIS — Z993 Dependence on wheelchair: Secondary | ICD-10-CM | POA: Diagnosis not present

## 2024-08-21 DIAGNOSIS — Z170 Estrogen receptor positive status [ER+]: Secondary | ICD-10-CM

## 2024-08-21 NOTE — Assessment & Plan Note (Signed)
 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  Neoadjuvant therapy with hormonal therapy based upon low risk Oncotype DX test result (score 10: 7% ROR) on letrozole  2.5 mg daily 08/21/2017-11/08/2018 2.  Followed by left mastectomy: 11/08/2018 5 cm invasive lobular cancer with 40% cellularity, grade 2, involves anterior margin which is skin, no LVI or PNI, 0/2 lymph nodes negative, ER 100%, PR 0%, Ki-67 5%, HER-2 -1+ by IHC, T2 N0 3.  Followed by adjuvant radiation 03/12/2019-04/24/2019 5.  Followed by antiestrogen therapy with letrozole  1 mg daily started September 2020 ---------------------------------------------------------------- Letrozole  toxicities:no side effects of concern She will finish 5 years of therapy by December 2023 and after that she will stop it.    Breast cancer surveillance:  07/28/2023: Right breast mammogram: Benign breast density category C, next mammogram scheduled for 09/10/2024 Breast Exam: 08/21/2024: Benign   History of Parkinson's disease: continues to be a challenge for her. Sees Dr.Siddiqui   Return to clinic on an as-needed basis

## 2024-08-21 NOTE — Progress Notes (Addendum)
 Patient Care Team: Antonio Meth, Jamee SAUNDERS, DO as PCP - General Buck Saucer, MD as Consulting Physician (Neurology) Mat Browning, MD as Consulting Physician (Obstetrics and Gynecology) Claudene Arthea HERO, DO as Consulting Physician (Family Medicine) Rosalia Hollering, MD as Consulting Physician (Neurology) Waylan Cain, MD as Consulting Physician (Ophthalmology) Odean Potts, MD as Consulting Physician (Hematology and Oncology) Izell Domino, MD as Attending Physician (Radiation Oncology) Vanderbilt Ned, MD as Consulting Physician (General Surgery) Georgina Barnie DEL, LCSW as Social Worker (Licensed Clinical Social Worker) Steffens, Browning SQUIBB, RN as Registered Nurse  DIAGNOSIS:  Encounter Diagnosis  Name Primary?   Malignant neoplasm of lower-outer quadrant of left breast of female, estrogen receptor positive (HCC) Yes    SUMMARY OF ONCOLOGIC HISTORY: Oncology History  Malignant neoplasm of lower-outer quadrant of left breast of female, estrogen receptor positive (HCC)  07/21/2017 Initial Diagnosis   Left breast lower outer quadrant 5.4 x 2.3 x 3.8 cm mass along with additional satellite nodules measuring 6 mm and several other small satellite nodules, no lymphadenopathy, biopsy revealed invasive lobular cancer with LCIS grade 2, ER 95%, PR 60%, Ki-67 10%, HER-2 negative ratio 1.11, T3 N0 stage II a clinical stage AJCC 8   08/21/2017 Oncotype testing   Oncotype Dx 10: ROR 7%   08/21/2017 -  Anti-estrogen oral therapy   Neoadjuvant letrozole    11/08/2018 Surgery   Left mastectomy: 5 cm invasive lobular cancer with 40% cellularity, grade 2, involves anterior margin which is skin, no LVI or PNI, 0/2 lymph nodes negative, ER 100%, PR 0%, Ki-67 5%, HER-2 -1+ by IHC, T2 N0   11/20/2018 Cancer Staging   Staging form: Breast, AJCC 8th Edition - Pathologic stage from 11/20/2018: No Stage Recommended (ypT2, pN0, cM0, G2, ER+, PR-, HER2-, Oncotype DX score: 10) - Signed by Odean Potts, MD  on 11/20/2018   03/12/2019 - 04/24/2019 Radiation Therapy   Adjuvant radiation     CHIEF COMPLIANT: Surveillance of breast cancer  HISTORY OF PRESENT ILLNESS: Amanda [name] 78 year old with above-mentioned history of breast cancer who had self discontinued antiestrogen therapy having completed 7 years of treatment.  She has Parkinson's disease and has been rapidly progressing.  She is wheelchair-bound.  She is here brought to to the clinic by an assistant.  She does not report any pain lumps or nodules in the breast.    ALLERGIES:  is allergic to penicillins.  MEDICATIONS:  Current Outpatient Medications  Medication Sig Dispense Refill   acetaminophen  (TYLENOL ) 500 MG tablet Take 500 mg by mouth every 6 (six) hours as needed for fever or mild pain (pain score 1-3).     aspirin EC 325 MG tablet Take 325 mg by mouth daily as needed for moderate pain.     b complex vitamins tablet Take 1 tablet by mouth daily. MEGA FOOD BALANCE SOURCE     benzonatate  (TESSALON ) 100 MG capsule Take 1 capsule (100 mg total) by mouth 2 (two) times daily as needed for cough. 20 capsule 0   CALCIUM-MAGNESIUM-VITAMIN D  PO Take 2 tablets by mouth 2 (two) times daily.     carbidopa -levodopa  (SINEMET  IR) 25-100 MG tablet Take 1 tablet by mouth 3 (three) times daily. 1 whole in morning and 1/2 tablet every 3 hours     cephALEXin  (KEFLEX ) 500 MG capsule Take 1 capsule (500 mg total) by mouth 2 (two) times daily. (Patient not taking: Reported on 07/29/2024) 20 capsule 0   ciprofloxacin  (CIPRO ) 500 MG tablet Take 1 tablet (500 mg total) by mouth  2 (two) times daily. (Patient not taking: Reported on 07/29/2024) 10 tablet 0   famotidine  (PEPCID ) 20 MG tablet TAKE 1 TABLET BY MOUTH TWICE A DAY (Patient taking differently: as needed.) 180 tablet 1   fluconazole  (DIFLUCAN ) 150 MG tablet Take 1 tablet (150 mg total) by mouth daily. May repeat in 3 days if needed. (Patient not taking: Reported on 07/29/2024) 2 tablet 0   hydrocortisone   (ANUSOL -HC) 25 MG suppository Place 1 suppository (25 mg total) rectally 2 (two) times daily. (Patient not taking: Reported on 07/29/2024) 24 suppository 0   ibuprofen (ADVIL) 200 MG tablet Take 200-400 mg by mouth every 6 (six) hours as needed for headache or mild pain (pain score 1-3).     melatonin 3 MG TABS tablet Take 3 mg by mouth at bedtime as needed.     mirabegron  ER (MYRBETRIQ ) 25 MG TB24 tablet Take 1 tablet (25 mg total) by mouth daily. (Patient not taking: Reported on 07/29/2024) 90 tablet 1   NONFORMULARY OR COMPOUNDED ITEM Please keep area in groin clean and dry-- use the nystatin  powder/ cream bid 1 each 0   nystatin  (MYCOSTATIN /NYSTOP ) powder APPLY TO AFFECTED AREA 3 TIMES A DAY 60 g 3   nystatin  cream (MYCOSTATIN ) Apply 1 Application topically 2 (two) times daily.     nystatin  cream (MYCOSTATIN ) Apply topically 2 (two) times daily. 30 g 0   nystatin  powder Apply 1 Application topically 3 (three) times daily. 15 g 3   oseltamivir  (TAMIFLU ) 75 MG capsule Take 1 capsule (75 mg total) by mouth daily. (Patient not taking: Reported on 07/29/2024) 10 capsule 0   pramipexole  (MIRAPEX ) 0.75 MG tablet Take 0.375 mg by mouth 3 (three) times daily.     Probiotic Product (PROBIOTIC DAILY) CAPS Take 1 capsule by mouth daily as needed (indigestion).     Propylene Glycol-Glycerin (SOOTHE OP) Place 2 drops into both eyes daily as needed (dry eyes).     rivastigmine (EXELON) 1.5 MG capsule Take 1.5 mg by mouth 2 (two) times daily.     sertraline  (ZOLOFT ) 50 MG tablet Take 1 tablet (50 mg total) by mouth at bedtime. (Patient taking differently: Take 100 mg by mouth at bedtime.) 90 tablet 3   No current facility-administered medications for this visit.    PHYSICAL EXAMINATION: ECOG PERFORMANCE STATUS: 1 - Symptomatic but completely ambulatory  There were no vitals filed for this visit. There were no vitals filed for this visit.  Physical Exam   (exam performed in the presence of a  chaperone)  LABORATORY DATA:  I have reviewed the data as listed    Latest Ref Rng & Units 04/16/2024    9:47 PM 10/05/2023    6:21 PM 10/27/2022    3:27 PM  CMP  Glucose 70 - 99 mg/dL 91  887  879   BUN 8 - 23 mg/dL 16  21  18    Creatinine 0.44 - 1.00 mg/dL 9.06  8.98  8.92   Sodium 135 - 145 mmol/L 141  138  143   Potassium 3.5 - 5.1 mmol/L 3.9  4.1  4.0   Chloride 98 - 111 mmol/L 105  108  104   CO2 22 - 32 mmol/L 24  23  28    Calcium 8.9 - 10.3 mg/dL 9.3  8.7  9.3   Total Protein 6.5 - 8.1 g/dL  6.1    Total Bilirubin 0.0 - 1.2 mg/dL  0.4    Alkaline Phos 38 - 126 U/L  53  AST 15 - 41 U/L  23    ALT 0 - 44 U/L  9      Lab Results  Component Value Date   WBC 5.6 04/16/2024   HGB 13.0 04/16/2024   HCT 39.6 04/16/2024   MCV 93.4 04/16/2024   PLT 227 04/16/2024   NEUTROABS 3.4 04/16/2024    ASSESSMENT & PLAN:  Malignant neoplasm of lower-outer quadrant of left breast of female, estrogen receptor positive (HCC) 07/21/17: Left breast lower outer quadrant 5.4 x 2.3 x 3.8 cm mass along with additional satellite nodules measuring 6 mm and several other small satellite nodules, no lymphadenopathy, biopsy revealed invasive lobular cancer with LCIS grade 2, ER 95%, PR 60%, Ki-67 10%, HER-2 negative ratio 1.11, T3 N0 stage II a clinical stage AJCC 8   Recommendation: 1.  Neoadjuvant therapy with hormonal therapy based upon low risk Oncotype DX test result (score 10: 7% ROR) on letrozole  2.5 mg daily 08/21/2017-11/08/2018 2.  Followed by left mastectomy: 11/08/2018 5 cm invasive lobular cancer with 40% cellularity, grade 2, involves anterior margin which is skin, no LVI or PNI, 0/2 lymph nodes negative, ER 100%, PR 0%, Ki-67 5%, HER-2 -1+ by IHC, T2 N0 3.  Followed by adjuvant radiation 03/12/2019-04/24/2019 5.  Followed by antiestrogen therapy with letrozole  1 mg daily started September 2018-September 2024 ---------------------------------------------------------------- Letrozole   toxicities:no side effects of concern She will finish 5 years of therapy by December 2025 and after that she will stop it.    Breast cancer surveillance:  07/28/2023: Right breast mammogram: Benign breast density category C Breast Exam: 08/21/2024: Benign   History of Parkinson's disease: continues to be a challenge for her. Sees Dr.Siddiqui Her Parkinson's is rapidly progressing and she is wheelchair dependent.  We discussed the pros and cons of additional mammograms and I do not recommend any further mammograms given her multiple other health issues.   Return to clinic in 1 year for long-term survivorship visit with Morna     No orders of the defined types were placed in this encounter.  The patient has a good understanding of the overall plan. she agrees with it. she will call with any problems that may develop before the next visit here.  I personally spent a total of 30 minutes in the care of the patient today including preparing to see the patient, getting/reviewing separately obtained history, performing a medically appropriate exam/evaluation, counseling and educating, placing orders, referring and communicating with other health care professionals, documenting clinical information in the EHR, independently interpreting results, communicating results, and coordinating care.   Viinay K Selim Durden, MD 08/21/24

## 2024-08-26 ENCOUNTER — Other Ambulatory Visit: Payer: Self-pay | Admitting: Family Medicine

## 2024-08-26 ENCOUNTER — Ambulatory Visit: Payer: Self-pay

## 2024-08-26 ENCOUNTER — Encounter: Payer: Self-pay | Admitting: Family Medicine

## 2024-08-26 DIAGNOSIS — R8290 Unspecified abnormal findings in urine: Secondary | ICD-10-CM

## 2024-08-26 DIAGNOSIS — R410 Disorientation, unspecified: Secondary | ICD-10-CM

## 2024-08-26 NOTE — Telephone Encounter (Signed)
 Orders placed. Home health folks will bring in urine tomorrow.

## 2024-08-26 NOTE — Telephone Encounter (Signed)
 Please call daughter [name] at [phone] to advise if she can drop off urine sample. Has cup from office. Please see note.  FYI Only or Action Required?: Requesting to d/o urine sample  Patient was last seen in primary care on 06/21/2024 by Wheeler Harlene CROME, NP.  Called Nurse Triage reporting Dysuria.  Symptoms began yesterday.  Interventions attempted: Rest, hydration, or home remedies.  Symptoms are: unchanged.  Triage Disposition: See Physician Within 24 Hours  Patient/caregiver understands and will follow disposition?:  Reason for Disposition  Bad or foul-smelling urine  Answer Assessment - Initial Assessment Questions Daughter of pt with parkinsons and dementia reports pt has malodorous urine and is more confused than normal, asking her HHA if she can get the little girl in the room a snack. States same as previous UTIs where she has seen children or kittens. Has urine sample from this morning at 0945 in cup from office.  States that normally, she drops of a urine sample instead of bringing her in. This RN contacted [facility] to confirm that she can do this and was advised to send [employer] CRM to office.  1. SYMPTOM: What's the main symptom you're concerned about? (e.g., frequency, incontinence)     Confusion, malodorous urine 2. ONSET:      Possibly yesterday 3. PAIN: Is there any pain? If Yes, ask: How bad is it? (Scale: 1-10; mild, moderate, severe)     Denies complaint of pain 4. CAUSE: What do you think is causing the symptoms?     UTI 5. OTHER SYMPTOMS: Do you have any other symptoms? (e.g., blood in urine, fever, flank pain, pain with urination)     Denies pt complaining of any symptoms  Protocols used: Urinary Symptoms-A-AH  Copied from CRM #[id number]. Topic: Clinical - Red Word Triage >> Aug 26, 2024 11:14 AM Laymon HERO wrote: Red Word that prompted transfer to Nurse Triage: Possible UTI- odor- starting to speak strange. Patient has Parkinson's.

## 2024-08-27 ENCOUNTER — Other Ambulatory Visit

## 2024-08-27 DIAGNOSIS — R8290 Unspecified abnormal findings in urine: Secondary | ICD-10-CM | POA: Diagnosis not present

## 2024-08-27 DIAGNOSIS — R410 Disorientation, unspecified: Secondary | ICD-10-CM

## 2024-08-28 ENCOUNTER — Ambulatory Visit: Payer: Self-pay | Admitting: Family Medicine

## 2024-08-28 LAB — URINALYSIS
Bilirubin Urine: NEGATIVE
Hgb urine dipstick: NEGATIVE
Leukocytes,Ua: NEGATIVE
Nitrite: NEGATIVE
Specific Gravity, Urine: 1.025 (ref 1.000–1.030)
Total Protein, Urine: NEGATIVE
Urine Glucose: NEGATIVE
Urobilinogen, UA: 0.2 (ref 0.0–1.0)
pH: 6 (ref 5.0–8.0)

## 2024-08-29 ENCOUNTER — Other Ambulatory Visit: Payer: Self-pay

## 2024-08-29 LAB — URINE CULTURE
MICRO NUMBER:: 17332282
SPECIMEN QUALITY:: ADEQUATE

## 2024-08-29 MED ORDER — NITROFURANTOIN MONOHYD MACRO 100 MG PO CAPS: 100.0000 mg | ORAL_CAPSULE | Freq: Two times a day (BID) | ORAL | 0 refills | Status: AC

## 2024-09-04 ENCOUNTER — Other Ambulatory Visit: Payer: Self-pay | Admitting: Licensed Clinical Social Worker
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
## 2024-09-04 NOTE — Patient Instructions (Signed)
 Visit Information  Thank you for taking time to visit with me today. Please don't hesitate to contact me if I can be of assistance to youin the future.   Please call the care guide team at [phone] if you need to cancel or reschedule your appointment.   Please call 911 if you are experiencing a Mental Health or Behavioral Health Crisis or need someone to talk to.  Patients daughter [name] verbalized understanding of Care plan and visit instructions communicated this visit  Cena Ligas, LCSW Clinical Social Worker [facility] [employer]

## 2024-09-06 ENCOUNTER — Other Ambulatory Visit: Payer: Self-pay

## 2024-09-06 ENCOUNTER — Other Ambulatory Visit: Payer: Self-pay | Admitting: Family Medicine

## 2024-09-06 DIAGNOSIS — R531 Weakness: Secondary | ICD-10-CM
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
## 2024-09-06 NOTE — Patient Instructions (Signed)
 Visit Information  Thank you for taking time to visit with me today. Please don't hesitate to contact me if I can be of assistance to you before our next scheduled appointment.  Your next care management appointment is by telephone on 10/04/24 at 2 PM  Please call the care guide team at [phone] if you need to cancel, schedule, or reschedule an appointment.   Please call the Suicide and Crisis Lifeline: 988 call 1-800-273-TALK (toll free, 24 hour hotline) if you are experiencing a Mental Health or Behavioral Health Crisis or need someone to talk to.  [name], RN MSN [place]  [facility] Population Health RN Care Manager Direct Dial: [phone]  Fax: [phone]  Following is a copy of your care plan:   Goals Addressed             This Visit's Progress    VBCI RN Care Plan   On track    Problems:  Chronic Disease Management support and education needs related to Parkinson's Disease Caregiving concerns  Goal: Over the next 30 days the patient's daughter(s) will demonstrate Ongoing adherence to prescribed treatment plan for Parkinson's Disease as evidenced by report of patient remaining free from falls Work with elder law attorney as evidenced by daughter's report  Interventions:   Evaluation of current treatment plan related to Parkinson's Disease, Caregiving concerns self-management and patient's adherence to plan as established by provider. Discussed plans with patient for ongoing care management follow up and provided patient with direct contact information for care management team Evaluation of current treatment plan related to Parkinson's Disease and patient's adherence to plan as established by provider Message sent to PCP regarding patient's daughter's request for PT referral  Reviewed current care giving concerns and next steps including making another appointment with elder law attorney Discussed current home health agency and previous concerns. Amanda  states that at this time they are going to stay with this agency and focus on meeting with elder law attorney Advised patient's daughter to discuss switching to pill packaging with her father/patient's husband for improved medication managment  Patient Self-Care Activities:  Attend all scheduled provider appointments Call provider office for new concerns or questions  Work with elder law attorney  Plan:  Telephone follow up appointment with care management team member scheduled for:  10/04/24 at 2 PM             Patient's daughter Amanda [name] verbalized understanding of Care plan and visit instructions communicated this visit

## 2024-09-10 ENCOUNTER — Ambulatory Visit

## 2024-09-26 ENCOUNTER — Other Ambulatory Visit: Payer: Self-pay

## 2024-09-26 ENCOUNTER — Ambulatory Visit: Attending: Family Medicine

## 2024-09-26 DIAGNOSIS — G20A1 Parkinson's disease without dyskinesia, without mention of fluctuations: Secondary | ICD-10-CM | POA: Diagnosis present

## 2024-09-26 DIAGNOSIS — R531 Weakness: Secondary | ICD-10-CM | POA: Diagnosis not present

## 2024-09-26 DIAGNOSIS — M6281 Muscle weakness (generalized): Secondary | ICD-10-CM | POA: Diagnosis present

## 2024-09-26 NOTE — Therapy (Signed)
 " OUTPATIENT PHYSICAL THERAPY NEURO EVALUATION   Patient Name: Amanda [name] MRN: [id number] DOB:[birth year], 79 y.o., female Today's Date: 09/26/2024   PCP: Antonio Cyndee Rockers DO REFERRING PROVIDER: same  END OF SESSION:  PT End of Session - 09/26/24 1850     Visit Number 1    Date for Recertification  11/21/24    Progress Note Due on Visit 10    PT Start Time 1315    PT Stop Time 1400    PT Time Calculation (min) 45 min    Activity Tolerance Patient tolerated treatment well    Behavior During Therapy WFL for tasks assessed/performed          Past Medical History:  Diagnosis Date   Breast cancer, left breast (HCC)    Frequency of urination    GAD (generalized anxiety disorder)    Osteopenia    Parkinson disease (HCC)    neurologist-  dr charolett mar--  right side predominant   Parkinson disease (HCC)    Prolapse of vaginal wall    Urgency of urination    Wears glasses    Past Surgical History:  Procedure Laterality Date   ABDOMINAL HYSTERECTOMY     BREAST BIOPSY Left 07/2017   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   COLONOSCOPY  last one 2007   CYSTOCELE REPAIR N/A 01/11/2016   Procedure: ANTERIOR REPAIR (CYSTOCELE) COLOPLAST FASCIA  SACROSPINOUS FIXATION;  Surgeon: Arlena Gal, MD;  Location: [facility];  Service: Urology;  Laterality: N/A;   CYSTOSCOPY W/ URETERAL STENT PLACEMENT Bilateral 01/11/2016   Procedure: CYSTOSCOPY WITH BILATERAL RETROGRADE PYELOGRAM/URETERAL STENT PLACEMENT;  Surgeon: Arlena Gal, MD;  Location: [facility];  Service: Urology;  Laterality: Bilateral;   ESOPHAGOGASTRODUODENOSCOPY  2007   EXTERNAL EAR SURGERY Bilateral age 56   LAPAROSCOPIC OVARIAN CYSTECTOMY  March 2008   unilateral   MASTECTOMY Left 2018   PUBOVAGINAL SLING N/A 01/11/2016   Procedure:  ALTIS SLING SINGLE INCISION;  Surgeon: Arlena Gal, MD;  Location: [facility];  Service: Urology;  Laterality:  N/A;   RECTOCELE REPAIR N/A 01/11/2016   Procedure: POSTERIOR REPAIR (RECTOCELE), PERINEAPLASTY;  Surgeon: Rosaline Cobble, MD;  Location: [facility];  Service: Gynecology;  Laterality: N/A;   SIMPLE MASTECTOMY WITH AXILLARY SENTINEL NODE BIOPSY Left 11/08/2018   Procedure: LEFT SIMPLE MASTECTOMY WITH LEFT  AXILLARY SENTINEL NODE MAPPING;  Surgeon: Vanderbilt Ned, MD;  Location: [facility];  Service: General;  Laterality: Left;   VAGINAL HYSTERECTOMY N/A 01/11/2016   Procedure: TOTAL VAGINAL HYSTERECTOMY ;  Surgeon: Rosaline Cobble, MD;  Location: [facility];  Service: Gynecology;  Laterality: N/A;   Patient Active Problem List   Diagnosis Date Noted   Vaginal itching 06/21/2024   Abnormal urine odor 10/02/2023   Tinea 10/02/2023   Urinary tract infection without hematuria 10/02/2023   Acute right flank pain 05/03/2023   Skin rash 01/16/2023   Olecranon bursitis of right elbow 01/16/2023   Dysuria 09/26/2022   Generalized anxiety disorder 06/02/2022   Osteoporosis 06/02/2022   Screening, ischemic heart disease 02/21/2022   Urinary incontinence 02/21/2022   Frequent falls 02/21/2022   Acute right ankle pain 06/17/2021   Drug-induced polyneuropathy 12/14/2020   Diarrhea 12/14/2020   Incontinence in female 12/14/2020   Dyspepsia 12/14/2020   Bunion 08/19/2019   Hammer toe 08/19/2019   Breast cancer, stage 2, left (HCC) 11/08/2018   Flexural eczema 05/13/2018   Malignant neoplasm of lower-outer quadrant  of left breast of female, estrogen receptor positive (HCC) 08/14/2017   Edema, lower extremity 05/02/2017   Insect sting 05/09/2016   Prolapse of female pelvic organs 01/11/2016   Biceps tendinitis on left 02/18/2014   Parkinson disease (HCC) 10/03/2012   Sinusitis 11/09/2011   Anxiety 06/03/2009   OVARIAN CYST 06/02/2008   Osteopenia 06/02/2008   FATIGUE 06/02/2008    ONSET DATE: chronic  REFERRING DIAG: parkinsons disease, reduced mobility,  malaise  THERAPY DIAG:  Parkinson's disease without dyskinesia, unspecified whether manifestations fluctuate (HCC)  Muscle weakness (generalized)  Rationale for Evaluation and Treatment: Rehabilitation  SUBJECTIVE:                                                                                                                                                                                             SUBJECTIVE STATEMENT: Confined to wheelchair for about a year, has participated with physical therapy with home health care in the past, daughter has several questions regarding skin care Pt accompanied by: family member  PERTINENT HISTORY: long history of parkinsons, general decline, referred to PT by PCP  PAIN:  Are you having pain? L side of neck chronic pain  PRECAUTIONS: Fall  RED FLAGS: None   WEIGHT BEARING RESTRICTIONS: No  FALLS: Has patient fallen in last 6 months? No  LIVING ENVIRONMENT: Lives with: lives with their spouse Lives in: House/apartment Stairs: 3 story home, pt stays on main level  Has following equipment at home: Environmental Consultant - 2 wheeled and Wheelchair (manual)  PLOF: Needs assistance with ADLs, Needs assistance with homemaking, and Needs assistance with transfers  PATIENT GOALS: daughter wants pt to have better positioning   OBJECTIVE:  Note: Objective measures were completed at Evaluation unless otherwise noted.  DIAGNOSTIC FINDINGS: na  COGNITION: Overall cognitive status: pt noted to have word finding difficulty today, poor short term memory   SENSATION: Not tested  COORDINATION: Delayed slow, tremor B hands  EDEMA:  none   POSTURE: in wheelchair sacral sitting, R lateral neck flexion noted, mild R trunk lean  LOWER EXTREMITY ROM:   very stiff, rigid, B hip flexion up to 105, Normal ER B, IR  B ankles with some plantarflexor contractures B shoulders restricted to 90 degrees B LOWER EXTREMITY MMT:  grossly 3+/ 5, quite rigid   BED  MOBILITY:  Max assist today from mat table  TRANSFERS: Mod assist, stand /squat pivot   FUNCTIONAL TESTS:  Na confined to wheelchair,  Able to sit unsupported on mat but with SBA due to poor, unstable dynamic sitting balance  PATIENT SURVEYS:  TBD  TREATMENT DATE: 09/26/24   Evaluation, addressed many questions for daughter regarding pts positioning and safety in bed and wheelchair PATIENT EDUCATION: Education details: POC goals Person educated: Child(ren) Education method: Medical Illustrator Education comprehension: verbalized understanding  HOME EXERCISE PROGRAM: TBD  GOALS: Goals reviewed with patient? No  SHORT TERM GOALS: Target date: TBD  TBD Baseline: Goal status: INITIAL  LONG TERM GOALS: Target date: TBD  TBD Baseline:  Goal status: INITIAL  2.  TBD Baseline:  Goal status: INITIAL  3.  TBD Baseline:  Goal status: INITIAL  4.  TBD Baseline:  Goal status: INITIAL   ASSESSMENT:  CLINICAL IMPRESSION: Patient is a 79 y.o. female who was evaluated today by physical therapy due to decline in function due to parkinsons disease.  She essentially is wheelchair and bed bound and her daughter had many questions, which were more nursing related.  The pt required mod assistance to transfer . Also altered posture with r sided trunk lean and R cervical side bending noted.  Her heels were quite red and she was reporting B heel pain and sacral pain with sleep and sitting.  Held off onscheduling additional PT as outpt as she may have more pertinent home health care needs regarding preventing skin breakdown.  She would benefit from PT to address transfer training and her cervical spine tightness and pain.  Will await to hear back from referring MD  OBJECTIVE IMPAIRMENTS: decreased activity tolerance, decreased balance, decreased  cognition, decreased coordination, decreased endurance, decreased knowledge of condition, decreased mobility, decreased safety awareness, hypomobility, increased fascial restrictions, impaired perceived functional ability, and postural dysfunction.   ACTIVITY LIMITATIONS: carrying, lifting, sitting, standing, stairs, transfers, bed mobility, and locomotion level  PARTICIPATION LIMITATIONS: medication management, driving, shopping, and community activity  PERSONAL FACTORS: Age, Behavior pattern, Past/current experiences, Time since onset of injury/illness/exacerbation, and 1-2 comorbidities: osteoporosis ,  are also affecting patient's functional outcome.   REHAB POTENTIAL: guarded  CLINICAL DECISION MAKING: Unstable/unpredictable  EVALUATION COMPLEXITY: High  PLAN:  PT FREQUENCY: awaiting word from MD   PT DURATION: other: awaiting word from MD  PLANNED INTERVENTIONS: TBD  PLAN FOR NEXT SESSION: in basket message sent to referring MD to see regarding nursing intervention will await response   Lorie Cleckley L Doreatha Offer, PT, DPT, OCS 09/26/2024, 6:55 PM        "

## 2024-09-27 ENCOUNTER — Other Ambulatory Visit: Payer: Self-pay | Admitting: Family Medicine

## 2024-10-04 ENCOUNTER — Other Ambulatory Visit: Payer: Self-pay

## 2024-10-04 NOTE — Patient Outreach (Signed)
 Care Coordination   10/04/2024 Name: KANETRA HO MRN: [id number] DOB: 01/14/1946   Care Coordination Outreach Attempts:  An unsuccessful outreach was attempted for an appointment today.  Follow Up Plan:  Additional outreach attempts will be made to complete CCM follow-up visit with patient's daughter, [name].   Encounter Outcome:  No Answer. HIPAA compliant voicemail left requesting return call.   [name], RN MSN [place]  [facility] Population Health RN Care Manager Direct Dial: [phone]  Fax: [phone]

## 2024-10-04 NOTE — Patient Instructions (Signed)
 Visit Information  Thank you for taking time to visit with me today. Please don't hesitate to contact me if I can be of assistance to you before our next scheduled appointment.  Your next care management appointment is by telephone on 11/01/24 at 2 PM  Please call the care guide team at [phone] if you need to cancel, schedule, or reschedule an appointment.   Please call the Suicide and Crisis Lifeline: 988 call 1-800-273-TALK (toll free, 24 hour hotline) if you are experiencing a Mental Health or Behavioral Health Crisis or need someone to talk to.  [name], RN MSN [place]  [facility] Population Health RN Care Manager Direct Dial: [phone]  Fax: [phone]   Following is a copy of your care plan:   Goals Addressed             This Visit's Progress    VBCI RN Care Plan   On track    Problems:  Chronic Disease Management support and education needs related to Parkinson's Disease Caregiving concerns  Goal: Over the next 30 days the patient's daughter(s) will demonstrate Ongoing adherence to prescribed treatment plan for Parkinson's Disease as evidenced by report of patient remaining free from falls Work with elder law attorney as evidenced by daughter's report  Interventions:   Evaluation of current treatment plan related to Parkinson's Disease, Caregiving concerns self-management and patient's adherence to plan as established by provider. Discussed plans with patient for ongoing care management follow up and provided patient with direct contact information for care management team Evaluation of current treatment plan related to Parkinson's Disease and patient's adherence to plan as established by provider Message sent to PCP requesting an update on status of PT involvement per initial PT evaluation 09/26/24  Reviewed current care giving concerns and next steps including meeting with elder law attorny Discussed current home health agency, which patient's  daughter reports they have switched to a new agency. She denies issues or concerns with current home health agency  Patient Self-Care Activities:  Attend all scheduled provider appointments Call provider office for new concerns or questions  Work with elder law attorney  Plan:  Telephone follow up appointment with care management team member scheduled for:  11/01/24 at 2 PM             Patient's daughter Amanda [name] verbalized understanding of Care plan and visit instructions communicated this visit

## 2024-10-04 NOTE — Patient Instructions (Signed)
 [name] - I am sorry I was unable to reach you today for our scheduled appointment. I work with Antonio Meth, Jamee SAUNDERS, DO and am calling to support your healthcare needs. Please contact me at [phone] at your earliest convenience. I look forward to speaking with you soon.   Thank you,  [name], RN MSN [place]  [facility] RN Care Manager Direct Dial: [phone]  Fax: [phone]

## 2024-10-04 NOTE — Patient Outreach (Signed)
 Complex Care Management   Visit Note  10/04/2024  Name:  Amanda [name] MRN: [id number] DOB: 1946/04/22  Situation: Referral received for Complex Care Management related to Parkinson's Disease I obtained verbal consent from patient's daughter Amanda [name].  Visit completed with Amanda [name]  on the phone  Background:   Past Medical History:  Diagnosis Date   Breast cancer, left breast (HCC)    Frequency of urination    GAD (generalized anxiety disorder)    Osteopenia    Parkinson disease [facility])    neurologist-  dr charolett mar--  right side predominant   Parkinson disease (HCC)    Prolapse of vaginal wall    Urgency of urination    Wears glasses     Assessment: Patient Reported Symptoms:  Cognitive Cognitive Status: Able to follow simple commands, Requires Assistance Decision Making, Struggling with memory recall, Poor judgment in daily scenarios Cognitive/Intellectual Conditions Management [RPT]: Other Other: History of dementia   Health Maintenance Behaviors: Annual physical exam, Healthy diet Health Facilitated by: Healthy diet, Rest  Neurological Neurological Review of Symptoms: Weakness, Other: [place] Neurological Symptoms/Conditions [RPT]: History of Parkinson's Disease, dementia per patient's daughter Neurological Management Strategies: Medication therapy, Routine screening  HEENT HEENT Symptoms Reported: No symptoms reported HEENT Management Strategies: Routine screening    Cardiovascular Cardiovascular Symptoms Reported: No symptoms reported Does patient have uncontrolled Hypertension?: No Cardiovascular Management Strategies: Routine screening  Respiratory Respiratory Symptoms Reported: No symptoms reported Respiratory Management Strategies: Routine screening  Endocrine Endocrine Symptoms Reported: Weakness or fatigue Is patient diabetic?: No    Gastrointestinal Gastrointestinal Symptoms Reported: No symptoms reported Gastrointestinal Management Strategies:  Coping strategies, Incontinence garment/pad    Genitourinary Genitourinary Symptoms Reported: No symptoms reported Genitourinary Management Strategies: Incontinence garment/pad  Integumentary Integumentary Symptoms Reported: Other Other Integumentary Symptoms: Note per chart review PT noted Her heels were quite red and she was reporting B heel pain and sacral pain with sleep and sitting. Patient's daughter reports she has not had the opportunity to look at her mother's skin thoroughly. Patient's daughter reports patient has a pressure sore mat on her bed Skin Management Strategies: Coping strategies, Medication therapy, Routine screening  Musculoskeletal Musculoskelatal Symptoms Reviewed: Difficulty walking, Limited mobility, Unsteady gait, Weakness Additional Musculoskeletal Details: Patient's daughter reports patient has continued to complain of pain in her neck and head. Musculoskeletal Management Strategies: Adequate rest, Coping strategies, Medical device, Routine screening (Wheelchair) Musculoskeletal Comment: Patient's daughter reports they had initial evaluation by PT, who was concerned about a spot on patient's right foot. Per chart review, PT was forwarding message to PCP regarding next steps. Patient's daughter reports they have not heard any update. Message sent to PCP to request update. Patient's daughter reports falls since previous visit that she is aware of Falls in the past year?: Yes Number of falls in past year: 2 or more Was there an injury with Fall?: No Fall Risk Category Calculator: 2 Patient Fall Risk Level: Moderate Fall Risk Patient at Risk for Falls Due to: History of fall(s), Impaired balance/gait, Impaired mobility, Mental status change, Other (Comment) (Parkinson's Disease) Fall risk Follow up: Falls evaluation completed, Education provided, Falls prevention discussed  Psychosocial Psychosocial Symptoms Reported: Not assessed          10/04/2024    PHQ2-9  Depression Screening   Little interest or pleasure in doing things    Feeling down, depressed, or hopeless    PHQ-2 - Total Score    Trouble falling or staying asleep, or sleeping too much  Feeling tired or having little energy    Poor appetite or overeating     Feeling bad about yourself - or that you are a failure or have let yourself or your family down    Trouble concentrating on things, such as reading the newspaper or watching television    Moving or speaking so slowly that other people could have noticed.  Or the opposite - being so fidgety or restless that you have been moving around a lot more than usual    Thoughts that you would be better off dead, or hurting yourself in some way    PHQ2-9 Total Score    If you checked off any problems, how difficult have these problems made it for you to do your work, take care of things at home, or get along with other people    Depression Interventions/Treatment      There were no vitals filed for this visit.    Medications Reviewed Today     Reviewed by Arno Rosaline SQUIBB, RN (Registered Nurse) on 10/04/24 at 1443  Med List Status: <None>   Medication Order Taking? Sig Documenting Provider Last Dose Status Informant  acetaminophen  (TYLENOL ) 500 MG tablet [id number]  Take 500 mg by mouth every 6 (six) hours as needed for fever or mild pain (pain score 1-3). [provider]  Active Self, Child, Pharmacy Records  aspirin EC 325 MG tablet [id number]  Take 325 mg by mouth daily as needed for moderate pain. [provider]  Active Self, Child, Pharmacy Records  b complex vitamins tablet [id number]  Take 1 tablet by mouth daily. MEGA FOOD BALANCE SOURCE [provider]  Active Self, Child, Pharmacy Records  benzonatate  (TESSALON ) 100 MG capsule [id number]  Take 1 capsule (100 mg total) by mouth 2 (two) times daily as needed for cough. Yacopino, Jessica L, NP  Active   CALCIUM-MAGNESIUM-VITAMIN D  PO [id number]  Take 2  tablets by mouth 2 (two) times daily. [provider]  Active Self, Child, Pharmacy Records  carbidopa -levodopa  (SINEMET  IR) 25-100 MG tablet [id number]  Take 1 tablet by mouth 3 (three) times daily. 1 whole in morning and 1/2 tablet every 3 hours [provider]  Active Self, Child, Pharmacy Records  cephALEXin  (KEFLEX ) 500 MG capsule [id number]  Take 1 capsule (500 mg total) by mouth 2 (two) times daily.  Patient not taking: Reported on 07/29/2024   Antonio Cyndee Jamee JONELLE, DO  Active Self, Child, Pharmacy Records           Med Note [facility], [place]   Thu Oct 05, 2023  9:22 PM) Started 10/01/22, patient on fourth day of therapy  ciprofloxacin  (CIPRO ) 500 MG tablet [id number]  Take 1 tablet (500 mg total) by mouth 2 (two) times daily.  Patient not taking: Reported on 07/29/2024   Antonio Cyndee Jamee JONELLE, DO  Active   famotidine  (PEPCID ) 20 MG tablet [id number]  TAKE 1 TABLET BY MOUTH TWICE A DAY  Patient taking differently: as needed.   Antonio Cyndee Jamee JONELLE, DO  Active Self, Child, Pharmacy Records  fluconazole  (DIFLUCAN ) 150 MG tablet [id number]  Take 1 tablet (150 mg total) by mouth daily. May repeat in 3 days if needed.  Patient not taking: Reported on 07/29/2024   Wheeler Harlene CROME, NP  Active   hydrocortisone  (ANUSOL -HC) 25 MG suppository [id number]  Place 1 suppository (25 mg total) rectally 2 (two) times daily.  Patient not taking: Reported on 07/29/2024   Lowne Chase, Yvonne R, DO  Active Self, Child, Pharmacy Records  ibuprofen (ADVIL) 200 MG tablet [id number]  Take 200-400 mg by mouth every 6 (six) hours as needed for headache or mild pain (pain score 1-3). [provider]  Active Self, Child, Pharmacy Records  melatonin 3 MG TABS tablet [id number]  Take 3 mg by mouth at bedtime as needed. [provider]  Active Self, Child, Pharmacy Records  mirabegron  ER (MYRBETRIQ ) 25 MG TB24 tablet [id number]  Take 1 tablet (25 mg total) by mouth daily.  Patient not  taking: Reported on 07/29/2024   Antonio Cyndee Jamee JONELLE, DO  Active Self, Child, Pharmacy Records  nitrofurantoin , macrocrystal-monohydrate, (MACROBID ) 100 MG capsule [id number]  Take 1 capsule (100 mg total) by mouth 2 (two) times daily. Antonio Cyndee Jamee JONELLE, DO  Active   NONFORMULARY OR COMPOUNDED RODRIGO [id number]  Please keep area in groin clean and dry-- use the nystatin  powder/ cream bid Antonio Cyndee, Yvonne R, DO  Active   nystatin  (MYCOSTATIN /NYSTOP ) powder [id number]  APPLY TO AFFECTED AREA 3 TIMES A DAY Antonio Cyndee, Yvonne R, DO  Active   nystatin  cream (MYCOSTATIN ) [id number]  Apply 1 Application topically 2 (two) times daily. [provider]  Active Self, Child, Pharmacy Records  nystatin  cream (MYCOSTATIN ) [id number]  Apply topically 2 (two) times daily. Lowne Chase, Yvonne R, DO  Active   nystatin  powder [id number]  Apply 1 Application topically 3 (three) times daily. Antonio Cyndee Jamee R, DO  Active   oseltamivir  (TAMIFLU ) 75 MG capsule [id number]  Take 1 capsule (75 mg total) by mouth daily.  Patient not taking: Reported on 07/29/2024   Lowne Chase, Yvonne R, DO  Active   pramipexole  (MIRAPEX ) 0.75 MG tablet [id number]  Take 0.375 mg by mouth 3 (three) times daily. [provider]  Active Self, Child, Pharmacy Records  Probiotic Product (PROBIOTIC DAILY) CAPS [id number]  Take 1 capsule by mouth daily as needed (indigestion). [provider]  Active Self, Child, Pharmacy Records  Propylene Glycol-Glycerin (SOOTHE OP) [id number]  Place 2 drops into both eyes daily as needed (dry eyes). [provider]  Active Self, Child, Pharmacy Records  rivastigmine (EXELON) 1.5 MG capsule [id number]  Take 1.5 mg by mouth 2 (two) times daily. [provider]  Active Self, Child, Pharmacy Records  sertraline  (ZOLOFT ) 50 MG tablet [id number]  Take 1 tablet (50 mg total) by mouth at bedtime.  Patient taking differently: Take 100 mg by mouth at bedtime.   Antonio Cyndee Jamee JONELLE, DO  Active Self, Child, Pharmacy Records            Recommendation:   Continue Current Plan of Care  Follow Up Plan:   Telephone follow up appointment date/time:  11/01/24 at 2 PM  [name], RN MSN [place]  [facility] RN Care Manager Direct Dial: [phone]  Fax: [phone]

## 2024-10-06 ENCOUNTER — Encounter: Payer: Self-pay | Admitting: Family Medicine

## 2024-10-10 ENCOUNTER — Ambulatory Visit: Admitting: Podiatry

## 2024-11-01 ENCOUNTER — Telehealth

## 2025-05-06 ENCOUNTER — Ambulatory Visit

## 2025-08-21 ENCOUNTER — Inpatient Hospital Stay: Admitting: Adult Health
